# Patient Record
Sex: Female | Born: 1935 | Race: White | Hispanic: No | Smoking: Former smoker
Health system: Southern US, Community
[De-identification: ages and names within clinical notes are randomized; demographics above are authoritative.]

## PROBLEM LIST (undated history)

## (undated) DIAGNOSIS — Z8619 Personal history of other infectious and parasitic diseases: Secondary | ICD-10-CM

## (undated) DIAGNOSIS — K922 Gastrointestinal hemorrhage, unspecified: Secondary | ICD-10-CM

## (undated) DIAGNOSIS — M1990 Unspecified osteoarthritis, unspecified site: Secondary | ICD-10-CM

## (undated) DIAGNOSIS — K219 Gastro-esophageal reflux disease without esophagitis: Secondary | ICD-10-CM

## (undated) DIAGNOSIS — E785 Hyperlipidemia, unspecified: Secondary | ICD-10-CM

## (undated) DIAGNOSIS — J449 Chronic obstructive pulmonary disease, unspecified: Secondary | ICD-10-CM

## (undated) DIAGNOSIS — I5031 Acute diastolic (congestive) heart failure: Secondary | ICD-10-CM

## (undated) DIAGNOSIS — N189 Chronic kidney disease, unspecified: Secondary | ICD-10-CM

## (undated) DIAGNOSIS — H547 Unspecified visual loss: Secondary | ICD-10-CM

## (undated) DIAGNOSIS — Z9289 Personal history of other medical treatment: Secondary | ICD-10-CM

## (undated) DIAGNOSIS — I10 Essential (primary) hypertension: Secondary | ICD-10-CM

## (undated) DIAGNOSIS — K5730 Diverticulosis of large intestine without perforation or abscess without bleeding: Secondary | ICD-10-CM

## (undated) DIAGNOSIS — I447 Left bundle-branch block, unspecified: Secondary | ICD-10-CM

## (undated) HISTORY — DX: Unspecified osteoarthritis, unspecified site: M19.90

## (undated) HISTORY — DX: Chronic obstructive pulmonary disease, unspecified: J44.9

## (undated) HISTORY — DX: Acute diastolic (congestive) heart failure: I50.31

## (undated) HISTORY — DX: Left bundle-branch block, unspecified: I44.7

## (undated) HISTORY — DX: Chronic kidney disease, unspecified: N18.9

## (undated) HISTORY — DX: Gastro-esophageal reflux disease without esophagitis: K21.9

## (undated) HISTORY — DX: Diverticulosis of large intestine without perforation or abscess without bleeding: K57.30

## (undated) HISTORY — DX: Hyperlipidemia, unspecified: E78.5

## (undated) HISTORY — DX: Essential (primary) hypertension: I10

## (undated) HISTORY — DX: Personal history of other medical treatment: Z92.89

## (undated) HISTORY — DX: Unspecified visual loss: H54.7

## (undated) HISTORY — DX: Personal history of other infectious and parasitic diseases: Z86.19

---

## 1968-10-15 HISTORY — PX: TOTAL ABDOMINAL HYSTERECTOMY: SHX209

## 1978-10-15 HISTORY — PX: CHOLECYSTECTOMY: SHX55

## 1997-10-15 HISTORY — PX: CARDIAC CATHETERIZATION: SHX172

## 2003-10-16 HISTORY — PX: CERVICAL DISC SURGERY: SHX588

## 2004-09-11 ENCOUNTER — Ambulatory Visit: Payer: Self-pay | Admitting: Internal Medicine

## 2004-11-13 ENCOUNTER — Ambulatory Visit: Payer: Self-pay | Admitting: Physician Assistant

## 2004-11-15 ENCOUNTER — Ambulatory Visit: Payer: Self-pay | Admitting: Physician Assistant

## 2005-02-28 ENCOUNTER — Ambulatory Visit: Payer: Self-pay | Admitting: Internal Medicine

## 2005-10-14 ENCOUNTER — Emergency Department: Payer: Self-pay | Admitting: Emergency Medicine

## 2006-07-04 ENCOUNTER — Ambulatory Visit: Payer: Self-pay | Admitting: Family Medicine

## 2006-07-24 ENCOUNTER — Ambulatory Visit: Payer: Self-pay | Admitting: Internal Medicine

## 2006-10-23 ENCOUNTER — Ambulatory Visit: Payer: Self-pay | Admitting: Family Medicine

## 2006-10-24 ENCOUNTER — Encounter: Payer: Self-pay | Admitting: Family Medicine

## 2006-10-25 ENCOUNTER — Ambulatory Visit: Payer: Self-pay | Admitting: Family Medicine

## 2006-10-25 LAB — CONVERTED CEMR LAB: PTH: 29.6 pg/mL (ref 14.0–72.0)

## 2006-11-11 ENCOUNTER — Ambulatory Visit: Payer: Self-pay | Admitting: Family Medicine

## 2007-03-17 ENCOUNTER — Ambulatory Visit: Payer: Self-pay | Admitting: Family Medicine

## 2007-03-17 DIAGNOSIS — I10 Essential (primary) hypertension: Secondary | ICD-10-CM

## 2007-03-17 DIAGNOSIS — R011 Cardiac murmur, unspecified: Secondary | ICD-10-CM

## 2007-03-17 DIAGNOSIS — N184 Chronic kidney disease, stage 4 (severe): Secondary | ICD-10-CM

## 2007-03-25 ENCOUNTER — Encounter: Payer: Self-pay | Admitting: Internal Medicine

## 2007-03-25 ENCOUNTER — Ambulatory Visit: Payer: Self-pay

## 2007-04-07 ENCOUNTER — Ambulatory Visit: Payer: Self-pay | Admitting: Internal Medicine

## 2007-04-07 ENCOUNTER — Ambulatory Visit: Payer: Self-pay | Admitting: Family Medicine

## 2007-04-07 DIAGNOSIS — N209 Urinary calculus, unspecified: Secondary | ICD-10-CM

## 2007-04-07 DIAGNOSIS — E7800 Pure hypercholesterolemia, unspecified: Secondary | ICD-10-CM | POA: Insufficient documentation

## 2007-04-07 LAB — CONVERTED CEMR LAB
Glucose, Urine, Semiquant: NEGATIVE
Ketones, urine, test strip: NEGATIVE
Nitrite: NEGATIVE
Protein, U semiquant: NEGATIVE
Specific Gravity, Urine: <1.005
pH: 5

## 2007-04-10 LAB — CONVERTED CEMR LAB
ALT: 15 units/L (ref 0–40)
Calcium: 9.6 mg/dL (ref 8.4–10.5)
Chloride: 110 meq/L (ref 96–112)
Cholesterol: 137 mg/dL (ref 0–200)
Creatinine, Ser: 1.4 mg/dL — ABNORMAL HIGH (ref 0.4–1.2)
GFR calc non Af Amer: 38 mL/min
LDL Cholesterol: 58 mg/dL (ref 0–99)
VLDL: 25 mg/dL (ref 0–40)

## 2007-05-05 ENCOUNTER — Encounter: Payer: Self-pay | Admitting: Family Medicine

## 2007-05-05 DIAGNOSIS — M159 Polyosteoarthritis, unspecified: Secondary | ICD-10-CM

## 2007-05-05 DIAGNOSIS — K5730 Diverticulosis of large intestine without perforation or abscess without bleeding: Secondary | ICD-10-CM | POA: Insufficient documentation

## 2007-05-05 DIAGNOSIS — I872 Venous insufficiency (chronic) (peripheral): Secondary | ICD-10-CM

## 2007-05-05 DIAGNOSIS — K219 Gastro-esophageal reflux disease without esophagitis: Secondary | ICD-10-CM | POA: Insufficient documentation

## 2007-05-05 DIAGNOSIS — J449 Chronic obstructive pulmonary disease, unspecified: Secondary | ICD-10-CM

## 2007-05-05 DIAGNOSIS — J309 Allergic rhinitis, unspecified: Secondary | ICD-10-CM | POA: Insufficient documentation

## 2007-05-05 DIAGNOSIS — N39498 Other specified urinary incontinence: Secondary | ICD-10-CM

## 2007-05-05 DIAGNOSIS — I739 Peripheral vascular disease, unspecified: Secondary | ICD-10-CM | POA: Insufficient documentation

## 2007-05-05 DIAGNOSIS — M5030 Other cervical disc degeneration, unspecified cervical region: Secondary | ICD-10-CM

## 2007-05-22 ENCOUNTER — Ambulatory Visit: Payer: Self-pay | Admitting: Family Medicine

## 2007-05-22 LAB — CONVERTED CEMR LAB
Cholesterol, target level: 200 mg/dL
HDL goal, serum: 40 mg/dL
LDL Goal: 100 mg/dL

## 2007-10-06 ENCOUNTER — Encounter (INDEPENDENT_AMBULATORY_CARE_PROVIDER_SITE_OTHER): Payer: Self-pay | Admitting: Internal Medicine

## 2007-10-06 ENCOUNTER — Ambulatory Visit: Payer: Self-pay | Admitting: Family Medicine

## 2007-10-06 LAB — CONVERTED CEMR LAB
Bacteria, UA: 0
Bilirubin Urine: NEGATIVE
Ketones, urine, test strip: NEGATIVE
Nitrite: NEGATIVE
Protein, U semiquant: NEGATIVE
Urobilinogen, UA: NEGATIVE

## 2007-10-13 ENCOUNTER — Ambulatory Visit: Payer: Self-pay | Admitting: Gynecology

## 2007-10-13 ENCOUNTER — Encounter (INDEPENDENT_AMBULATORY_CARE_PROVIDER_SITE_OTHER): Payer: Self-pay | Admitting: Internal Medicine

## 2007-10-20 ENCOUNTER — Ambulatory Visit: Payer: Self-pay | Admitting: Family Medicine

## 2007-10-20 LAB — CONVERTED CEMR LAB
Bilirubin Urine: NEGATIVE
Glucose, Urine, Semiquant: NEGATIVE
Ketones, urine, test strip: NEGATIVE
Nitrite: NEGATIVE
Specific Gravity, Urine: 1.02
Urine crystals, microscopic: 0 /hpf
pH: 6

## 2007-10-21 ENCOUNTER — Encounter: Payer: Self-pay | Admitting: Family Medicine

## 2007-11-03 ENCOUNTER — Ambulatory Visit: Payer: Self-pay | Admitting: Family Medicine

## 2007-11-03 ENCOUNTER — Encounter (INDEPENDENT_AMBULATORY_CARE_PROVIDER_SITE_OTHER): Payer: Self-pay | Admitting: *Deleted

## 2007-11-03 LAB — CONVERTED CEMR LAB
Ketones, urine, test strip: NEGATIVE
Nitrite: NEGATIVE
Specific Gravity, Urine: 1.015
WBC Urine, dipstick: NEGATIVE
pH: 6

## 2007-11-07 ENCOUNTER — Encounter: Payer: Self-pay | Admitting: Family Medicine

## 2007-11-25 ENCOUNTER — Ambulatory Visit: Payer: Self-pay | Admitting: Family Medicine

## 2007-11-27 ENCOUNTER — Ambulatory Visit: Payer: Self-pay | Admitting: Family Medicine

## 2007-11-27 LAB — CONVERTED CEMR LAB
BUN: 24 mg/dL — ABNORMAL HIGH (ref 6–23)
Creatinine, Ser: 1.3 mg/dL — ABNORMAL HIGH (ref 0.4–1.2)
GFR calc Af Amer: 50 mL/min
Potassium: 3.9 meq/L (ref 3.5–5.1)

## 2007-12-11 ENCOUNTER — Telehealth: Payer: Self-pay | Admitting: Family Medicine

## 2008-01-07 ENCOUNTER — Ambulatory Visit: Payer: Self-pay | Admitting: Family Medicine

## 2008-01-09 LAB — CONVERTED CEMR LAB
BUN: 25 mg/dL — ABNORMAL HIGH (ref 6–23)
CO2: 32 meq/L (ref 19–32)
Chloride: 106 meq/L (ref 96–112)
Cholesterol: 147 mg/dL (ref 0–200)
Creatinine, Ser: 1.3 mg/dL — ABNORMAL HIGH (ref 0.4–1.2)
Glucose, Bld: 104 mg/dL — ABNORMAL HIGH (ref 70–99)
Triglycerides: 96 mg/dL (ref 0–149)

## 2008-02-20 ENCOUNTER — Ambulatory Visit: Payer: Self-pay | Admitting: Family Medicine

## 2008-02-20 LAB — CONVERTED CEMR LAB
Albumin: 4.1 g/dL (ref 3.5–5.2)
Alkaline Phosphatase: 69 units/L (ref 39–117)
BUN: 31 mg/dL — ABNORMAL HIGH (ref 6–23)
Bilirubin, Direct: 0.1 mg/dL (ref 0.0–0.3)
GFR calc Af Amer: 50 mL/min
GFR calc non Af Amer: 41 mL/min
LDL Cholesterol: 107 mg/dL — ABNORMAL HIGH (ref 0–99)
Potassium: 4.2 meq/L (ref 3.5–5.1)
Sodium: 143 meq/L (ref 135–145)
VLDL: 26 mg/dL (ref 0–40)

## 2008-02-27 ENCOUNTER — Ambulatory Visit: Payer: Self-pay | Admitting: Family Medicine

## 2008-02-27 DIAGNOSIS — R300 Dysuria: Secondary | ICD-10-CM

## 2008-02-27 LAB — CONVERTED CEMR LAB
Ketones, urine, test strip: NEGATIVE
Urobilinogen, UA: 0.2

## 2008-03-31 ENCOUNTER — Ambulatory Visit: Payer: Self-pay | Admitting: Family Medicine

## 2008-04-01 LAB — CONVERTED CEMR LAB
ALT: 15 units/L (ref 0–35)
AST: 20 units/L (ref 0–37)
Albumin: 4 g/dL (ref 3.5–5.2)
Alkaline Phosphatase: 67 units/L (ref 39–117)
BUN: 35 mg/dL — ABNORMAL HIGH (ref 6–23)
Basophils Absolute: 0 10*3/uL (ref 0.0–0.1)
Basophils Relative: 0 % (ref 0.0–1.0)
Bilirubin, Direct: 0.1 mg/dL (ref 0.0–0.3)
CO2: 31 meq/L (ref 19–32)
Chloride: 106 meq/L (ref 96–112)
Eosinophils Relative: 1.4 % (ref 0.0–5.0)
Glucose, Bld: 111 mg/dL — ABNORMAL HIGH (ref 70–99)
Hemoglobin: 13.7 g/dL (ref 12.0–15.0)
Lymphocytes Relative: 20 % (ref 12.0–46.0)
MCHC: 35.1 g/dL (ref 30.0–36.0)
Monocytes Relative: 9 % (ref 3.0–12.0)
Neutro Abs: 5 10*3/uL (ref 1.4–7.7)
Potassium: 4.7 meq/L (ref 3.5–5.1)
RBC: 4.25 M/uL (ref 3.87–5.11)
Total Protein: 7.1 g/dL (ref 6.0–8.3)
WBC: 7.3 10*3/uL (ref 4.5–10.5)

## 2008-04-05 ENCOUNTER — Telehealth (INDEPENDENT_AMBULATORY_CARE_PROVIDER_SITE_OTHER): Payer: Self-pay | Admitting: *Deleted

## 2008-05-10 ENCOUNTER — Ambulatory Visit: Payer: Self-pay | Admitting: Family Medicine

## 2008-05-10 DIAGNOSIS — G609 Hereditary and idiopathic neuropathy, unspecified: Secondary | ICD-10-CM | POA: Insufficient documentation

## 2008-05-20 ENCOUNTER — Telehealth: Payer: Self-pay | Admitting: Family Medicine

## 2008-06-04 ENCOUNTER — Encounter: Payer: Self-pay | Admitting: Family Medicine

## 2008-06-10 ENCOUNTER — Ambulatory Visit: Payer: Self-pay | Admitting: Family Medicine

## 2008-06-11 ENCOUNTER — Telehealth: Payer: Self-pay | Admitting: Family Medicine

## 2008-07-01 ENCOUNTER — Ambulatory Visit: Payer: Self-pay | Admitting: Family Medicine

## 2008-10-19 ENCOUNTER — Telehealth: Payer: Self-pay | Admitting: Family Medicine

## 2008-11-25 ENCOUNTER — Telehealth: Payer: Self-pay | Admitting: Family Medicine

## 2008-12-06 ENCOUNTER — Ambulatory Visit: Payer: Self-pay | Admitting: Family Medicine

## 2008-12-06 LAB — CONVERTED CEMR LAB
Bilirubin Urine: NEGATIVE
Glucose, Urine, Semiquant: NEGATIVE
Ketones, urine, test strip: NEGATIVE
Protein, U semiquant: NEGATIVE
Urobilinogen, UA: 0.2

## 2009-02-02 ENCOUNTER — Ambulatory Visit: Payer: Self-pay | Admitting: Family Medicine

## 2009-07-11 ENCOUNTER — Ambulatory Visit: Payer: Self-pay | Admitting: Family Medicine

## 2009-07-11 LAB — CONVERTED CEMR LAB
Albumin: 3.9 g/dL (ref 3.5–5.2)
BUN: 40 mg/dL — ABNORMAL HIGH (ref 6–23)
Basophils Absolute: 0.1 10*3/uL (ref 0.0–0.1)
CO2: 30 meq/L (ref 19–32)
Cholesterol: 177 mg/dL (ref 0–200)
Eosinophils Absolute: 0.3 10*3/uL (ref 0.0–0.7)
GFR calc non Af Amer: 37.57 mL/min (ref >60)
Glucose, Bld: 102 mg/dL — ABNORMAL HIGH (ref 70–99)
HCT: 40 % (ref 36.0–46.0)
HDL: 49.7 mg/dL (ref >39.00)
Hemoglobin: 13.5 g/dL (ref 12.0–15.0)
Lymphs Abs: 2.2 10*3/uL (ref 0.7–4.0)
MCHC: 33.8 g/dL (ref 30.0–36.0)
Monocytes Absolute: 0.6 10*3/uL (ref 0.1–1.0)
Monocytes Relative: 9.2 % (ref 3.0–12.0)
Neutro Abs: 3.4 10*3/uL (ref 1.4–7.7)
Platelets: 181 10*3/uL (ref 150.0–400.0)
Potassium: 4.2 meq/L (ref 3.5–5.1)
RDW: 12.6 % (ref 11.5–14.6)
Sodium: 146 meq/L — ABNORMAL HIGH (ref 135–145)
TSH: 1.58 microintl units/mL (ref 0.35–5.50)
Total Bilirubin: 0.7 mg/dL (ref 0.3–1.2)
VLDL: 23.6 mg/dL (ref 0.0–40.0)

## 2009-07-14 ENCOUNTER — Ambulatory Visit: Payer: Self-pay | Admitting: Family Medicine

## 2009-07-20 ENCOUNTER — Encounter: Payer: Self-pay | Admitting: Family Medicine

## 2009-08-03 ENCOUNTER — Encounter (INDEPENDENT_AMBULATORY_CARE_PROVIDER_SITE_OTHER): Payer: Self-pay | Admitting: *Deleted

## 2009-09-22 ENCOUNTER — Ambulatory Visit: Payer: Self-pay | Admitting: Family Medicine

## 2009-10-03 ENCOUNTER — Ambulatory Visit: Payer: Self-pay | Admitting: Family Medicine

## 2009-10-17 ENCOUNTER — Telehealth: Payer: Self-pay | Admitting: Family Medicine

## 2009-11-11 ENCOUNTER — Ambulatory Visit: Payer: Self-pay | Admitting: Family Medicine

## 2009-12-12 ENCOUNTER — Ambulatory Visit: Payer: Self-pay | Admitting: Family Medicine

## 2009-12-13 ENCOUNTER — Encounter: Payer: Self-pay | Admitting: Family Medicine

## 2009-12-26 ENCOUNTER — Ambulatory Visit: Payer: Self-pay | Admitting: Family Medicine

## 2009-12-27 LAB — CONVERTED CEMR LAB
CO2: 28 meq/L (ref 19–32)
Chloride: 113 meq/L — ABNORMAL HIGH (ref 96–112)
Creatinine, Ser: 1.9 mg/dL — ABNORMAL HIGH (ref 0.4–1.2)
Potassium: 5.1 meq/L (ref 3.5–5.1)
Sodium: 145 meq/L (ref 135–145)

## 2009-12-28 ENCOUNTER — Encounter: Payer: Self-pay | Admitting: Family Medicine

## 2010-01-02 ENCOUNTER — Ambulatory Visit: Payer: Self-pay | Admitting: Family Medicine

## 2010-01-04 LAB — CONVERTED CEMR LAB
BUN: 62 mg/dL — ABNORMAL HIGH (ref 6–23)
CO2: 26 meq/L (ref 19–32)
Chloride: 112 meq/L (ref 96–112)
GFR calc non Af Amer: 22.28 mL/min (ref >60)
Glucose, Bld: 112 mg/dL — ABNORMAL HIGH (ref 70–99)
Potassium: 4.5 meq/L (ref 3.5–5.1)
Sodium: 143 meq/L (ref 135–145)

## 2010-01-05 ENCOUNTER — Encounter (INDEPENDENT_AMBULATORY_CARE_PROVIDER_SITE_OTHER): Payer: Self-pay | Admitting: *Deleted

## 2010-01-10 ENCOUNTER — Ambulatory Visit: Payer: Self-pay | Admitting: Family Medicine

## 2010-01-10 LAB — CONVERTED CEMR LAB
Ketones, urine, test strip: NEGATIVE
Nitrite: POSITIVE
Protein, U semiquant: 30
Specific Gravity, Urine: 1.02
TSH: 2.64 microintl units/mL (ref 0.35–5.50)

## 2010-01-11 ENCOUNTER — Encounter: Payer: Self-pay | Admitting: Family Medicine

## 2010-01-11 LAB — CONVERTED CEMR LAB
BUN: 59 mg/dL — ABNORMAL HIGH (ref 6–23)
CO2: 27 meq/L (ref 19–32)
Chloride: 111 meq/L (ref 96–112)
Potassium: 4.7 meq/L (ref 3.5–5.1)

## 2010-01-17 ENCOUNTER — Telehealth: Payer: Self-pay | Admitting: Family Medicine

## 2010-01-23 ENCOUNTER — Encounter: Payer: Self-pay | Admitting: Cardiology

## 2010-01-25 ENCOUNTER — Ambulatory Visit: Payer: Self-pay | Admitting: Family Medicine

## 2010-01-25 DIAGNOSIS — N3941 Urge incontinence: Secondary | ICD-10-CM

## 2010-01-25 LAB — CONVERTED CEMR LAB
Blood in Urine, dipstick: NEGATIVE
Glucose, Urine, Semiquant: NEGATIVE
Ketones, urine, test strip: NEGATIVE
Nitrite: NEGATIVE
Protein, U semiquant: NEGATIVE
Specific Gravity, Urine: 1.01
pH: 5

## 2010-01-26 ENCOUNTER — Ambulatory Visit: Payer: Self-pay | Admitting: Cardiology

## 2010-01-27 LAB — CONVERTED CEMR LAB
BUN: 59 mg/dL — ABNORMAL HIGH (ref 6–23)
CO2: 22 meq/L (ref 19–32)
Chloride: 106 meq/L (ref 96–112)
Glucose, Bld: 91 mg/dL (ref 70–99)
Hemoglobin: 12.5 g/dL (ref 12.0–15.0)
Potassium: 5.2 meq/L (ref 3.5–5.3)
RBC: 4.18 M/uL (ref 3.87–5.11)

## 2010-02-07 ENCOUNTER — Ambulatory Visit: Payer: Self-pay

## 2010-02-07 ENCOUNTER — Encounter: Payer: Self-pay | Admitting: Cardiology

## 2010-02-09 ENCOUNTER — Telehealth: Payer: Self-pay | Admitting: Family Medicine

## 2010-02-09 ENCOUNTER — Ambulatory Visit: Payer: Self-pay | Admitting: Cardiology

## 2010-02-23 LAB — CONVERTED CEMR LAB
BUN: 69 mg/dL — ABNORMAL HIGH (ref 6–23)
Calcium: 8.4 mg/dL (ref 8.4–10.5)
Creatinine, Ser: 2.31 mg/dL — ABNORMAL HIGH (ref 0.40–1.20)
Glucose, Bld: 125 mg/dL — ABNORMAL HIGH (ref 70–99)

## 2010-02-27 ENCOUNTER — Telehealth: Payer: Self-pay | Admitting: Family Medicine

## 2010-02-28 ENCOUNTER — Telehealth: Payer: Self-pay | Admitting: Family Medicine

## 2010-03-02 ENCOUNTER — Ambulatory Visit: Payer: Self-pay | Admitting: Cardiology

## 2010-03-02 ENCOUNTER — Telehealth: Payer: Self-pay | Admitting: Cardiology

## 2010-03-02 DIAGNOSIS — R0989 Other specified symptoms and signs involving the circulatory and respiratory systems: Secondary | ICD-10-CM | POA: Insufficient documentation

## 2010-03-02 DIAGNOSIS — I5032 Chronic diastolic (congestive) heart failure: Secondary | ICD-10-CM | POA: Insufficient documentation

## 2010-03-02 LAB — CONVERTED CEMR LAB
Calcium: 9.2 mg/dL (ref 8.4–10.5)
Glucose, Bld: 93 mg/dL (ref 70–99)
Pro B Natriuretic peptide (BNP): 202.9 pg/mL — ABNORMAL HIGH (ref 0.0–100.0)
Sodium: 140 meq/L (ref 135–145)

## 2010-03-14 ENCOUNTER — Ambulatory Visit: Payer: Self-pay

## 2010-03-14 ENCOUNTER — Encounter: Payer: Self-pay | Admitting: Cardiology

## 2010-03-20 ENCOUNTER — Ambulatory Visit: Payer: Self-pay | Admitting: Family Medicine

## 2010-03-22 ENCOUNTER — Encounter: Payer: Self-pay | Admitting: Cardiovascular Disease

## 2010-04-03 ENCOUNTER — Ambulatory Visit: Payer: Self-pay | Admitting: Cardiovascular Disease

## 2010-04-04 ENCOUNTER — Telehealth: Payer: Self-pay | Admitting: Family Medicine

## 2010-04-24 ENCOUNTER — Ambulatory Visit: Payer: Self-pay | Admitting: Family Medicine

## 2010-04-28 ENCOUNTER — Telehealth: Payer: Self-pay | Admitting: Cardiovascular Disease

## 2010-04-28 ENCOUNTER — Ambulatory Visit: Payer: Self-pay | Admitting: Family Medicine

## 2010-04-28 DIAGNOSIS — E875 Hyperkalemia: Secondary | ICD-10-CM | POA: Insufficient documentation

## 2010-04-30 LAB — CONVERTED CEMR LAB
Basophils Relative: 1.7 % (ref 0.0–3.0)
Chloride: 115 meq/L — ABNORMAL HIGH (ref 96–112)
Creatinine, Ser: 2 mg/dL — ABNORMAL HIGH (ref 0.4–1.2)
Eosinophils Relative: 4.9 % (ref 0.0–5.0)
HDL: 48.9 mg/dL (ref >39.00)
LDL Cholesterol: 80 mg/dL (ref 0–99)
MCV: 93.9 fL (ref 78.0–100.0)
Monocytes Absolute: 0.8 10*3/uL (ref 0.1–1.0)
Neutrophils Relative %: 51.6 % (ref 43.0–77.0)
Potassium: 5.3 meq/L — ABNORMAL HIGH (ref 3.5–5.1)
RBC: 3.41 M/uL — ABNORMAL LOW (ref 3.87–5.11)
Sodium: 145 meq/L (ref 135–145)
Total CHOL/HDL Ratio: 3
Triglycerides: 119 mg/dL (ref 0.0–149.0)
WBC: 6.8 10*3/uL (ref 4.5–10.5)

## 2010-05-03 ENCOUNTER — Ambulatory Visit: Payer: Self-pay | Admitting: Family Medicine

## 2010-05-04 ENCOUNTER — Ambulatory Visit: Payer: Self-pay | Admitting: Family Medicine

## 2010-05-04 LAB — CONVERTED CEMR LAB
OCCULT 2: NEGATIVE
OCCULT 3: NEGATIVE

## 2010-05-08 LAB — CONVERTED CEMR LAB
Basophils Absolute: 0.1 10*3/uL (ref 0.0–0.1)
Ferritin: 53.2 ng/mL (ref 10.0–291.0)
GFR calc non Af Amer: 24.15 mL/min (ref >60)
Hemoglobin: 11.1 g/dL — ABNORMAL LOW (ref 12.0–15.0)
Lymphocytes Relative: 29.3 % (ref 12.0–46.0)
Monocytes Relative: 10.8 % (ref 3.0–12.0)
Neutrophils Relative %: 53.7 % (ref 43.0–77.0)
Platelets: 230 10*3/uL (ref 150.0–400.0)
Potassium: 5.1 meq/L (ref 3.5–5.1)
RDW: 14.1 % (ref 11.5–14.6)
Sodium: 142 meq/L (ref 135–145)
Vitamin B-12: 817 pg/mL (ref 211–911)

## 2010-05-18 ENCOUNTER — Ambulatory Visit: Payer: Self-pay | Admitting: Family Medicine

## 2010-06-02 ENCOUNTER — Ambulatory Visit: Payer: Self-pay | Admitting: Family Medicine

## 2010-06-02 LAB — CONVERTED CEMR LAB
Nitrite: POSITIVE
Specific Gravity, Urine: >=1.03
Urobilinogen, UA: 0.2

## 2010-06-03 ENCOUNTER — Encounter: Payer: Self-pay | Admitting: Family Medicine

## 2010-06-13 ENCOUNTER — Ambulatory Visit: Payer: Self-pay | Admitting: Family Medicine

## 2010-08-06 ENCOUNTER — Inpatient Hospital Stay (HOSPITAL_COMMUNITY): Admission: EM | Admit: 2010-08-06 | Discharge: 2010-08-06 | Payer: Self-pay | Admitting: Emergency Medicine

## 2010-08-07 ENCOUNTER — Telehealth: Payer: Self-pay | Admitting: Family Medicine

## 2010-08-11 ENCOUNTER — Ambulatory Visit: Payer: Self-pay | Admitting: Family Medicine

## 2010-08-11 LAB — CONVERTED CEMR LAB
BUN: 51 mg/dL — ABNORMAL HIGH (ref 6–23)
Chloride: 111 meq/L (ref 96–112)
Creatinine, Ser: 2.1 mg/dL — ABNORMAL HIGH (ref 0.4–1.2)
Glucose, Bld: 87 mg/dL (ref 70–99)

## 2010-08-14 ENCOUNTER — Ambulatory Visit: Payer: Self-pay | Admitting: Family Medicine

## 2010-08-16 LAB — CONVERTED CEMR LAB
Calcium: 8.4 mg/dL (ref 8.4–10.5)
Creatinine, Ser: 1.9 mg/dL — ABNORMAL HIGH (ref 0.4–1.2)
GFR calc non Af Amer: 26.51 mL/min (ref >60)
Glucose, Bld: 117 mg/dL — ABNORMAL HIGH (ref 70–99)
Sodium: 144 meq/L (ref 135–145)

## 2010-08-17 ENCOUNTER — Ambulatory Visit: Payer: Self-pay | Admitting: Family Medicine

## 2010-08-18 LAB — CONVERTED CEMR LAB
ALT: 10 units/L (ref 0–35)
AST: 18 units/L (ref 0–37)
Albumin: 3.7 g/dL (ref 3.5–5.2)
Alkaline Phosphatase: 58 units/L (ref 39–117)
BUN: 49 mg/dL — ABNORMAL HIGH (ref 6–23)
CO2: 24 meq/L (ref 19–32)
Chloride: 113 meq/L — ABNORMAL HIGH (ref 96–112)
Direct LDL: 122.1 mg/dL
GFR calc non Af Amer: 29.16 mL/min (ref >60)
Glucose, Bld: 89 mg/dL (ref 70–99)
HDL: 41 mg/dL (ref >39.00)
Potassium: 4.8 meq/L (ref 3.5–5.1)
Sodium: 144 meq/L (ref 135–145)
Total Protein: 6.3 g/dL (ref 6.0–8.3)

## 2010-08-25 ENCOUNTER — Ambulatory Visit: Payer: Self-pay | Admitting: Family Medicine

## 2010-08-29 ENCOUNTER — Encounter: Payer: Self-pay | Admitting: Family Medicine

## 2010-08-29 ENCOUNTER — Telehealth: Payer: Self-pay | Admitting: Family Medicine

## 2010-09-18 ENCOUNTER — Telehealth: Payer: Self-pay | Admitting: Family Medicine

## 2010-09-19 ENCOUNTER — Ambulatory Visit: Payer: Self-pay | Admitting: Family Medicine

## 2010-09-26 ENCOUNTER — Encounter: Payer: Self-pay | Admitting: Family Medicine

## 2010-09-27 ENCOUNTER — Telehealth: Payer: Self-pay | Admitting: Family Medicine

## 2010-09-29 ENCOUNTER — Ambulatory Visit: Payer: Self-pay | Admitting: Nephrology

## 2010-10-03 ENCOUNTER — Ambulatory Visit: Payer: Self-pay | Admitting: Family Medicine

## 2010-10-03 ENCOUNTER — Encounter: Payer: Self-pay | Admitting: Family Medicine

## 2010-10-06 ENCOUNTER — Encounter (INDEPENDENT_AMBULATORY_CARE_PROVIDER_SITE_OTHER): Payer: Self-pay | Admitting: *Deleted

## 2010-10-11 ENCOUNTER — Ambulatory Visit: Payer: Self-pay | Admitting: Nephrology

## 2010-10-13 ENCOUNTER — Encounter: Payer: Self-pay | Admitting: Family Medicine

## 2010-11-02 ENCOUNTER — Other Ambulatory Visit: Payer: Self-pay | Admitting: Unknown Physician Specialty

## 2010-11-03 ENCOUNTER — Encounter: Payer: Self-pay | Admitting: Family Medicine

## 2010-11-13 ENCOUNTER — Encounter: Payer: Self-pay | Admitting: Family Medicine

## 2010-11-14 NOTE — Progress Notes (Signed)
Summary: congestion   Phone Note Call from Patient Call back at Home Phone [phone]   Caller: Patient Call For: Kerby Nora MD Summary of Call: Patient is calling to let you know that she is still having some congestion that she spoke with you about at her last office visit. She is taking the mucinex, zyrtec, and also using saline spray. She is asking if there is something else she could take. Uses [employer].  Initial call taken by: Melody Comas,  September 18, 2010 11:08 AM  Follow-up for Phone Call        I would recheck this 77 year old.  Heather, set up f/u. Hannah Beat MD  September 18, 2010 5:27 PM      Appended Document: congestion  Appt made for tomorrow at 3:30 p

## 2010-11-14 NOTE — Assessment & Plan Note (Signed)
Summary: F1M/AMD  Medications Added BYSTOLIC 10 MG TABS (NEBIVOLOL HCL) Take 1 tablet by mouth once a day HYDRALAZINE HCL 25 MG TABS (HYDRALAZINE HCL) Take one tablet by mouth three times a day HYDRALAZINE HCL 50 MG TABS (HYDRALAZINE HCL) Take one tablet by mouth three times a day CHLORTHALIDONE 25 MG TABS (CHLORTHALIDONE) Take 1 tablet by mouth once a day      Allergies Added:   Visit Type:  Follow-up Primary Provider:  Kerby Nora MD  CC:  no cp, lots of indigestion, and some edema in ankles and feet.  History of Present Illness: 77 yo with history of difficult to control HTN, COPD, hyperlipidemia follws up for evaluation of HTN.  Patient's BP has been running high.  She was told that her loss of vision in the right eye was due to hypertension.  I cut back on her chlorthalidone and losartan  because of an increase in creatinine.  She brings in home BP readings.  SBP has ranged from 134 - 163.  Today, BP is 164/79.   Echo was suggestive of diastolic dysfunction with normal EF (60-65%).  Patient is not short of breath walking on flat ground but does get short of breath walking up a flight of steps.    Labs (3/11): K 4.7, creatinine 2, TSH normal Labs (4/11): K 4.7, creatinine 2.3 Labs (5/11): K 5.2, creatinine 2.16  Current Medications (verified): 1)  Multivitamins  Tabs (Multiple Vitamin) .... Take 1 Tablet By Mouth Once A Day 2)  Vitamin E 1000 Unit Caps (Vitamin E) .... Take 1 Capsule By Mouth Once A Day 3)  Aspirin 81 Mg Tbec (Aspirin) .... Take 1 Tablet By Mouth Once A Day 4)  Celebrex 200 Mg Caps (Celecoxib) .... Take 1 Capsule By Mouth Once A Day 5)  Tylenol Pm Extra Strength  Tabs (Diphenhydramine-Apap (Sleep) Tabs) .... Take 1 Tab By Mouth At Bedtime 6)  Simvastatin 40 Mg  Tabs (Simvastatin) .... Take 1 Tablet By Mouth Once A Day 7)  Bystolic 5 Mg Tabs (Nebivolol Hcl) .Marland Kitchen.. 1 Tab By Mouth Daily 8)  Gabapentin 600 Mg Tabs (Gabapentin) .Marland Kitchen.. 1 Tab By Mouth At Bedtime 9)   Promethegan 25 Mg Supp (Promethazine Hcl) .Marland Kitchen.. 1 Per Rectum Q 6 Hours As Needed For Nausea 10)  Losartan Potassium 100 Mg Tabs (Losartan Potassium) .... Take 1/2 Tablet Daily 11)  Chlorthalidone 100 Mg Tabs (Chlorthalidone) .... 1/4 Tab  By Mouth Daily 12)  Hydralazine Hcl 25 Mg Tabs (Hydralazine Hcl) .... Take One Tablet By Mouth Three Times A Day 13)  Enablex 15 Mg Xr24h-Tab (Darifenacin Hydrobromide) .Marland Kitchen.. 1 Tab By Mouth Daily 14)  Hydralazine Hcl 25 Mg Tabs (Hydralazine Hcl) .... Take One Tablet By Mouth Three Times A Day  Allergies (verified): 1)  ! Penicillin V Potassium (Penicillin V Potassium) 2)  ! Levaquin (Levofloxacin)  Past History:  Past Medical History: 1. Allergic rhinitis 2. COPD 3. Diverticulosis, colon 4. GERD 5. Hyperlipidemia 6. Hypertension: Lower extremity swelling with amlodipine, intolerant of ACEIs 7. Osteoarthritis 8. CKD 9. Impaired vision right eye, was told this was due to HTN 10.  Normal cath in 1999 11.  LBBB 12.  Diastolic CHF: Echo (4/11) with EF 60-65%, mild LVH, mild aortic insufficiency, mild MR, PA systolic pressure 37-41 mmHg, mild to moderate TR.   Family History: Reviewed history from 05/05/2007 and no changes required. Father: Died 25, dementia Mother: Died 60, complete heart block Siblings: 6 brothers, lung CA, sinus CA 2 sisters, one  with Colon CA, one with dementia CV:  (+) HBP:  (+) DM:  Brother Colon CA:  Sister  Social History: Reviewed history from 01/26/2010 and no changes required. Former Smoker: > 1 ppd, quit 1986 Alcohol use-yes, light Drug use-no Lives alone Children: 3 daughters, 1 RN Occupation: Retired, Therapist, nutritional Exercise:  Golf Diet:  3 meals a day  Review of Systems       All systems reviewed and negative except as per HPI.   Vital Signs:  Patient profile:   77 year old female Height:      62.25 inches Weight:      147.50 pounds BMI:     26.86 Pulse rate:   62 / minute Pulse rhythm:    irregular BP sitting:   164 / 79  (left arm) Cuff size:   large  Vitals Entered By: Mercer Pod (Mar 02, 2010 2:31 PM)  Physical Exam  General:  Well developed, well nourished, in no acute distress. Neck:  Neck supple,  JVP 8 cm. No masses, thyromegaly or abnormal cervical nodes. Lungs:  Clear bilaterally to auscultation and percussion. Heart:  Non-displaced PMI, chest non-tender; regular rate and rhythm, S1, S2 without murmurs, rubs.  +S4. Carotid upstroke normal, right carotid bruit.  Pedals normal pulses. 1+ ankle edema.  Abdomen:  Bowel sounds positive; abdomen soft and non-tender without masses, organomegaly, or hernias noted. No hepatosplenomegaly. Extremities:  No clubbing or cyanosis. Neurologic:  Alert and oriented x 3. Psych:  Normal affect.   Impression & Recommendations:  Problem # 1:  HYPERTENSION, UNSPECIFIED (ICD-401.9) BP is still running fairly high.  I will increase her nebivolol to 10 mg daily and hydralazine to 50 mg three times a day.  I will not change chlorthalidone or losartan.  Creatinine is a little lower today.  Will need to keep an eye on creatinine and K, may need further decrease in chlorthalidone/losartan based on renal function.  BP check in 2 wks, followup in 1 month and will get labs.   Problem # 2:  CAROTID BRUIT, RIGHT (ICD-785.9) There is a right-sided carotid bruit.  Given history of visual loss in right eye, would be concerned for significant right ICA stenosis.  Will get carotid dopplers.   Problem # 3:  DIASTOLIC HEART FAILURE, CHRONIC (ICD-428.32) Patient is mildly volume overloaded with mild exertional dyspnea.  Echo is suggestive of diastolic dysfunction with mild pulmonary hypertension.  I will work on blood pressure control.  Will not aggressively diurese given mild symptoms and renal dysfunction.   Other Orders: T-Basic Metabolic Panel [phone]) T-BNP  (B Natriuretic Peptide) [phone]) Carotid Duplex (Carotid  Duplex)  Patient Instructions: 1)  Your physician recommends that you schedule a follow-up appointment in: 1 month 2)  Your physician has recommended you make the following change in your medication: Increase Bystolic to 10mg  daily and increase Hydralazine to 50mg  three times a day. 3)  Your physician has requested that you have a carotid duplex. This test is an ultrasound of the carotid arteries in your neck. It looks at blood flow through these arteries that supply the brain with blood. Allow one hour for this exam. There are no restrictions or special instructions. 4)  Your physician has requested that you regularly monitor and record your blood pressure readings at home.  Please use the same machine at the same time of day to check your readings and call in 2 weeks to report. Prescriptions: HYDRALAZINE HCL 50 MG TABS (HYDRALAZINE HCL) Take one tablet  by mouth three times a day  #270 x 3   Entered by:   Cloyde Reams RN   Authorized by:   Marca Ancona, MD   Signed by:   Cloyde Reams RN on 03/02/2010   Method used:   Electronically to        [facility]* (mail-order)       [address]       [place], [place]  [address]       Ph: [phone]       Fax: [phone]   RxID:   [id number] BYSTOLIC 10 MG TABS (NEBIVOLOL HCL) Take 1 tablet by mouth once a day  #90 x 3   Entered by:   Cloyde Reams RN   Authorized by:   Marca Ancona, MD   Signed by:   Cloyde Reams RN on 03/02/2010   Method used:   Electronically to        [facility]* (mail-order)       [address]       [place], [place]  [address]       Ph: [phone]       Fax: [phone]   RxID:   [id number]   Appended Document: F1M/AMD Called spoke with pt.  Pt has been monitoring BP's regularly.  BP's 5/21-134/59 5/22-115/50 5/23-116/61 5/24-113/49 5/25-103/53 5/26-114/52 5/28-132/48 5/29-124/53 5/30-128/51 6/1-103/45 6/3-135/57 6/4-145/48 6/11-131/58. Pt states she feels pretty  good overall.  EWJ  Appended Document: F1M/AMD These look ok  Appended Document: F1M/AMD Called spoke with pt aware BP's look OK, continue on same meds.  [facility]

## 2010-11-14 NOTE — Assessment & Plan Note (Signed)
Summary: Elizabeth Silva 2 wks cyd   Vital Signs:  Patient profile:   77 year old female Height:      62.25 inches Weight:      146.4 pounds Temp:     98.1 degrees F oral Pulse rate:   60 / minute Pulse rhythm:   regular BP sitting:   132 / 60  (left arm) Cuff size:   regular  Vitals Entered By: Benny Lennert CMA Duncan Dull) (January 25, 2010 11:01 AM)  History of Present Illness: Chief complaint 2 week follow up  Mild COPD. Wheezing in chest improved with Spiriva.  No cough. No fever.  HTN, poor control at home 181/71.Marland KitchenMarland KitchenIn past maxide ineffective, amlodipine caused severe peripheral swelling, ? intolerant of ACE I.  Here today BP well control. Has checked home meter in apst..matched up in office  She is on max metoprolol with HR limitations, max losartan. Chlorthalidone 100 mg has decreased her renal function significantly with only moderate improvement in BP. Returned  to 50 mg daily..recheck Cr. Has appt with cardiologist tommorow.   Continues with urinary incontinece. Wearing pad all the time.  Has completed course of cipro for UTI.Marland KitchenUA today clear.  Problems Prior to Update: 1)  Uti  (ICD-599.0) 2)  Essential Hypertension, Benign  (ICD-401.1) 3)  Visual Changes  (ICD-368.10) 4)  Headache  (ICD-784.0) 5)  Other Screening Mammogram  (ICD-V76.12) 6)  Special Screening For Osteoporosis  (ICD-V82.81) 7)  Ganglion Cyst, Wrist, Left  (ICD-727.41) 8)  Special Screening Malig Neoplasms Other Sites  (ICD-V76.49) 9)  Peripheral Neuropathy, Idiopathic  (ICD-356.9) 10)  Fatigue  (ICD-780.79) 11)  Dysuria, Chronic  (ICD-788.1) 12)  Vaginal Mass  (ICD-625.8) 13)  Stress Incontinence  (ICD-788.39) 14)  Venous Insufficiency  (ICD-459.81) 15)  Degeneration, Cervical Disc  (ICD-722.4) 16)  Retinal Vein Occlusion, Blind in Right Eye  (ICD-362.30) 17)  Renal Insufficiency  (ICD-588.9) 18)  Peripheral Vascular Disease  (ICD-443.9) 19)  Osteoarthritis  (ICD-715.90) 20)  Gerd  (ICD-530.81) 21)   Diverticulosis, Colon  (ICD-562.10) 22)  COPD  (ICD-496) 23)  Allergic Rhinitis  (ICD-477.9) 24)  Renal Stone  (ICD-592.9) 25)  Hypercholesterolemia, Pure  (ICD-272.0) 26)  Cardiac Murmur  (ICD-785.2) 27)  Kidney Disease, Chronic, Stage Iii  (ICD-585.3) 28)  Essential Hypertension  (ICD-401.9)  Current Medications (verified): 1)  Multivitamins  Tabs (Multiple Vitamin) .... Take 1 Tablet By Mouth Once A Day 2)  Vitamin E 1000 Unit Caps (Vitamin E) .... Take 1 Capsule By Mouth Once A Day 3)  Aspirin 81 Mg Tbec (Aspirin) .... Take 1 Tablet By Mouth Once A Day 4)  Celebrex 200 Mg Caps (Celecoxib) .... Take 1 Capsule By Mouth Once A Day 5)  Tylenol Pm Extra Strength  Tabs (Diphenhydramine-Apap (Sleep) Tabs) .... Take 1 Tab By Mouth At Bedtime 6)  Simvastatin 40 Mg  Tabs (Simvastatin) .... Take 1 Tablet By Mouth Once A Day 7)  Metoprolol Tartrate 25 Mg  Tabs (Metoprolol Tartrate) .... Take 1 Tablet By Mouth Every 12 Hours 8)  Gabapentin 600 Mg Tabs (Gabapentin) .Marland Kitchen.. 1 Tab By Mouth At Bedtime 9)  Promethegan 25 Mg Supp (Promethazine Hcl) .Marland Kitchen.. 1 Per Rectum Q 6 Hours As Needed For Nausea 10)  Losartan Potassium 100 Mg Tabs (Losartan Potassium) .Marland Kitchen.. 1 By Mouth Daily 11)  Chlorthalidone 100 Mg Tabs (Chlorthalidone) .... 1/2 Tab  By Mouth Daily 12)  Spiriva Handihaler 18 Mcg Caps (Tiotropium Bromide Monohydrate) .Marland Kitchen.. 1 Cap  Inhalation Daily 13)  Detrol La 4 Mg Xr24h-Cap (  Tolterodine Tartrate) .Marland Kitchen.. 1 Tab By Mouth Daily  Allergies: 1)  ! Penicillin V Potassium (Penicillin V Potassium) 2)  ! Levaquin (Levofloxacin)  Past History:  Past medical, surgical, family and social histories (including risk factors) reviewed, and no changes noted (except as noted below).  Past Medical History: Reviewed history from 05/05/2007 and no changes required. Allergic rhinitis COPD Diverticulosis, colon GERD Hyperlipidemia Hypertension Osteoarthritis Peripheral vascular disease Renal insufficiency  Past  Surgical History: Reviewed history from 05/05/2007 and no changes required. 2003      Hospitalized colitis, pseudomembranous - C.Diff 1970      Total hysterectomy - suspicious cells 1980      Cholecystectomy 1999      Stress test - ischemia 1999      Cath - nml. 2000      PFT's 2005      C-spine - cerv. disc  dz.  C5-C7, facet arthritis 2004      Abd. CT (-) 04/2005   ABI nml 05/2006   CXR, COPD, osteopenia 11/2006   DXA nml. T + 1.32  Family History: Reviewed history from 05/05/2007 and no changes required. Father: Died 24, dementia Mother: Died 47, complete heart block Siblings: 6 brothers, lung CA, sinus CA 2 sisters, one with Colon CA, one with dementia CV:  (+) HBP:  (+) DM:  Brother Colon CA:  Sister  Social History: Reviewed history from 05/05/2007 and no changes required. Former Smoker Alcohol use-yes, light Drug use-no Marital Status: Married, 64 years Children: 3 daughters, 1 Charity fundraiser Occupation: Retired, Therapist, nutritional Exercise:  Golf Diet:  3 meals a day  Review of Systems General:  Denies fatigue and fever. CV:  Denies chest pain or discomfort. Resp:  Denies shortness of breath. GI:  Denies abdominal pain, bloody stools, constipation, and diarrhea. GU:  Complains of incontinence, nocturia, and urinary frequency; denies dysuria and hematuria.  Physical Exam  General:  elderly female in NAD Mouth:  MMM Neck:  no carotid bruit or thyromegaly no cervical or supraclavicular lymphadenopathy  Lungs:  Normal respiratory effort, chest expands symmetrically. Lungs are clear to auscultation, no crackles or wheezes. Heart:  Normal rate and regular rhythm. S1 and S2 normal without gallop, murmur, click, rub or other extra sounds. Abdomen:  Bowel sounds positive,abdomen soft and non-tender without masses, organomegaly or hernias noted. No CVA tenderness Pulses:  R and L posterior tibial pulses are full and equal bilaterally  Extremities:  trace left pedal edema and trace  right pedal edema.     Impression & Recommendations:  Problem # 1:  UTI (ICD-599.0) Resolved.  The following medications were removed from the medication list:    Ciprofloxacin Hcl 500 Mg Tabs (Ciprofloxacin hcl) .Marland Kitchen... 1 tab by mouth two times a day x 7 days Her updated medication list for this problem includes:    Detrol La 4 Mg Xr24h-cap (Tolterodine tartrate) .Marland Kitchen... 1 tab by mouth daily  Problem # 2:  INCONTINENCE, URGE (ICD-788.31) now the at UTI resolved..treat with Detrol LA..samples given.  Discussed SE in elderly.Symptoms: Characteristics: Location: Onset: Aggravating Factors: Relieving Factors: History:  Level of Urgency: Nursing Diagnosis/Chief Complaint: Suggested Follow Up To Physician:  Patient Education: Contingency Plan: Follow Up Response: if sedation.   Problem # 3:  COPD (ICD-496) Improved control on spiriva. Continue. No further SOB, wheeze.  Her updated medication list for this problem includes:    Spiriva Handihaler 18 Mcg Caps (Tiotropium bromide monohydrate) .Marland Kitchen... 1 cap  inhalation daily  Problem # 4:  ESSENTIAL  HYPERTENSION, BENIGN (ICD-401.1) Poor control on current meds..refer to cards for difficult to control BPs.. bring meter to verify again home measurments as last 2 OV BP have been good.  LAst check Creatinine trending down on lower dose of diuretic.  Her updated medication list for this problem includes:    Metoprolol Tartrate 25 Mg Tabs (Metoprolol tartrate) .Marland Kitchen... Take 1 tablet by mouth every 12 hours    Losartan Potassium 100 Mg Tabs (Losartan potassium) .Marland Kitchen... 1 by mouth daily    Chlorthalidone 100 Mg Tabs (Chlorthalidone) .Marland Kitchen... 1/2 tab  by mouth daily  Complete Medication List: 1)  Multivitamins Tabs (Multiple vitamin) .... Take 1 tablet by mouth once a day 2)  Vitamin E 1000 Unit Caps (Vitamin e) .... Take 1 capsule by mouth once a day 3)  Aspirin 81 Mg Tbec (Aspirin) .... Take 1 tablet by mouth once a day 4)  Celebrex 200 Mg Caps  (Celecoxib) .... Take 1 capsule by mouth once a day 5)  Tylenol Pm Extra Strength Tabs (Diphenhydramine-apap (sleep) tabs) .... Take 1 tab by mouth at bedtime 6)  Simvastatin 40 Mg Tabs (Simvastatin) .... Take 1 tablet by mouth once a day 7)  Metoprolol Tartrate 25 Mg Tabs (Metoprolol tartrate) .... Take 1 tablet by mouth every 12 hours 8)  Gabapentin 600 Mg Tabs (Gabapentin) .Marland Kitchen.. 1 tab by mouth at bedtime 9)  Promethegan 25 Mg Supp (Promethazine hcl) .Marland Kitchen.. 1 per rectum q 6 hours as needed for nausea 10)  Losartan Potassium 100 Mg Tabs (Losartan potassium) .Marland Kitchen.. 1 by mouth daily 11)  Chlorthalidone 100 Mg Tabs (Chlorthalidone) .... 1/2 tab  by mouth daily 12)  Spiriva Handihaler 18 Mcg Caps (Tiotropium bromide monohydrate) .Marland Kitchen.. 1 cap  inhalation daily 13)  Detrol La 4 Mg Xr24h-cap (Tolterodine tartrate) .Marland Kitchen.. 1 tab by mouth daily  Patient Instructions: 1)  Start  Detrol LA daily. 2)  Continue SPiriva daily for breathing.  3)  Follow up urinary incontinence in 3 month 4)  Call  sooner if no improvement. Keep appt with cardiology for BP control..bring meter to OV appt.  Current Allergies (reviewed today): ! PENICILLIN V POTASSIUM (PENICILLIN V POTASSIUM) ! LEVAQUIN (LEVOFLOXACIN)  Laboratory Results   Urine Tests  Date/Time Received: January 25, 2010 11:08 AM  Date/Time Reported: January 25, 2010 11:08 AM   Routine Urinalysis   Color: yellow Appearance: Clear Glucose: negative   (Normal Range: Negative) Bilirubin: negative   (Normal Range: Negative) Ketone: negative   (Normal Range: Negative) Spec. Gravity: 1.010   (Normal Range: 1.003-1.035) Blood: negative   (Normal Range: Negative) pH: 5.0   (Normal Range: 5.0-8.0) Protein: negative   (Normal Range: Negative) Urobilinogen: 0.2   (Normal Range: 0-1) Nitrite: negative   (Normal Range: Negative) Leukocyte Esterace: negative   (Normal Range: Negative)

## 2010-11-14 NOTE — Assessment & Plan Note (Signed)
Summary: F/U [facility] ON 08/05/10/[facility]   Vital Signs:  Patient profile:   77 year old female Height:      62.25 inches Weight:      134.0 pounds BMI:     24.40 Temp:     98.2 degrees F oral Pulse rate:   64 / minute Pulse rhythm:   regular BP sitting:   110 / 64  (left arm) Cuff size:   regular  Vitals Entered By: Benny Lennert CMA Duncan Dull) (August 11, 2010 11:49 AM)  History of Present Illness: Chief complaint follow up [place]   10/23 admitted for SOB, chest pressure, mild cough. Dx with COPD, exacerbation..started on IV rochephin and zithromax.Marland KitchenMarland KitchenD/C'd on ceftin (has 3 more days) and spiriva.  [place] ing albuterol as needed...has not needed this since discharge.  CXR and EKG nml.   Found to have Hyperkalemia K 6.2....given kayexylate, IV fluids.  Not on any potassium supplement..was  on losartan and chlorthalidone...stopped in hosp. BP running well at home since off these meds.   Aso found to have UTI.Marland Kitchenasymptomatic...started on Zosyn Referred to [facility]..supposed to call for an appointment Cr 1.9 in hopsital..baseline around 2.  Problems Prior to Update: 1)  Leg Pain, Bilateral  (ICD-729.5) 2)  Hyperkalemia  (ICD-276.7) 3)  Encounter For Long-term Use of Other Medications  (ICD-V58.69) 4)  Diastolic Heart Failure, Chronic  (ICD-428.32) 5)  Carotid Bruit, Right  (ICD-785.9) 6)  Incontinence, Urge  (ICD-788.31) 7)  Visual Changes  (ICD-368.10) 8)  Other Screening Mammogram  (ICD-V76.12) 9)  Special Screening For Osteoporosis  (ICD-V82.81) 10)  Ganglion Cyst, Wrist, Left  (ICD-727.41) 11)  Special Screening Malig Neoplasms Other Sites  (ICD-V76.49) 12)  Peripheral Neuropathy, Idiopathic  (ICD-356.9) 13)  Dysuria, Chronic  (ICD-788.1) 14)  Stress Incontinence  (ICD-788.39) 15)  Venous Insufficiency  (ICD-459.81) 16)  Degeneration, Cervical Disc  (ICD-722.4) 17)  Retinal Vein Occlusion, Blind in Right Eye  (ICD-362.30) 18)  Renal Insufficiency  (ICD-588.9) 19)   Peripheral Vascular Disease  (ICD-443.9) 20)  Osteoarthritis  (ICD-715.90) 21)  Gerd  (ICD-530.81) 22)  Diverticulosis, Colon  (ICD-562.10) 23)  COPD  (ICD-496) 24)  Allergic Rhinitis  (ICD-477.9) 25)  Renal Stone  (ICD-592.9) 26)  Hypercholesterolemia, Pure  (ICD-272.0) 27)  Cardiac Murmur  (ICD-785.2) 28)  Kidney Disease, Chronic, Stage Iii  (ICD-585.3) 29)  Essential Hypertension  (ICD-401.9)  Current Medications (verified): 1)  Vitamin E 1000 Unit Caps (Vitamin E) .... Take 1 Capsule By Mouth Once A Day 2)  Aspirin 81 Mg Tbec (Aspirin) .... Take 1 Tablet By Mouth Once A Day 3)  Celebrex 200 Mg Caps (Celecoxib) .... Take 1 Capsule By Mouth Once A Day 4)  Tylenol Pm Extra Strength  Tabs (Diphenhydramine-Apap (Sleep) Tabs) .... Take 1 Tab By Mouth At Bedtime 5)  Bystolic 10 Mg Tabs (Nebivolol Hcl) .... Take 1 Tablet By Mouth Once A Day 6)  Gabapentin 600 Mg Tabs (Gabapentin) .Marland Kitchen.. 1 Tab By Mouth At Bedtime 7)  Enablex 15 Mg Xr24h-Tab (Darifenacin Hydrobromide) .Marland Kitchen.. 1 Tab By Mouth Daily 8)  Hydralazine Hcl 50 Mg Tabs (Hydralazine Hcl) .... Take One Tablet By Mouth Three Times A Day 9)  Proventil Hfa 108 (90 Base) Mcg/act Aers (Albuterol Sulfate) .... 2 Puffs Every 4 Hours 10)  Ceftin 250 Mg Tabs (Cefuroxime Axetil) .... One Tablet 2 Times Daily 11)  Mucinex 600 Mg Xr12h-Tab (Guaifenesin) .... One Tablet 2 Times Daily 12)  Norvasc 5 Mg Tabs (Amlodipine Besylate) .... One Tablet Daily 13)  Spiriva Handihaler 18 Mcg Caps (Tiotropium Bromide Monohydrate) .Marland Kitchen.. 1 Inhalation Daily  Allergies: 1)  ! Penicillin V Potassium (Penicillin V Potassium) 2)  ! Levaquin (Levofloxacin)  Past History:  Past medical, surgical, family and social histories (including risk factors) reviewed, and no changes noted (except as noted below).  Past Medical History: Reviewed history from 03/02/2010 and no changes required. 1. Allergic rhinitis 2. COPD 3. Diverticulosis, colon 4. GERD 5. Hyperlipidemia 6.  Hypertension: Lower extremity swelling with amlodipine, intolerant of ACEIs 7. Osteoarthritis 8. CKD 9. Impaired vision right eye, was told this was due to HTN 10.  Normal cath in 1999 11.  LBBB 12.  Diastolic CHF: Echo (4/11) with EF 60-65%, mild LVH, mild aortic insufficiency, mild MR, PA systolic pressure 37-41 mmHg, mild to moderate TR.   Past Surgical History: Reviewed history from 05/05/2007 and no changes required. 2003      Hospitalized colitis, pseudomembranous - C.Diff 1970      Total hysterectomy - suspicious cells 1980      Cholecystectomy 1999      Stress test - ischemia 1999      Cath - nml. 2000      PFT's 2005      C-spine - cerv. disc  dz.  C5-C7, facet arthritis 2004      Abd. CT (-) 04/2005   ABI nml 05/2006   CXR, COPD, osteopenia 11/2006   DXA nml. T + 1.32  Family History: Reviewed history from 05/05/2007 and no changes required. Father: Died 54, dementia Mother: Died 61, complete heart block Siblings: 6 brothers, lung CA, sinus CA 2 sisters, one with Colon CA, one with dementia CV:  (+) HBP:  (+) DM:  Brother Colon CA:  Sister  Social History: Reviewed history from 01/26/2010 and no changes required. Former Smoker: > 1 ppd, quit 1986 Alcohol use-yes, light Drug use-no Lives alone Children: 3 daughters, 1 RN Occupation: Retired, Therapist, nutritional Exercise:  Golf Diet:  3 meals a day  Review of Systems General:  Denies fatigue and fever. CV:  Denies chest pain or discomfort. Resp:  Complains of cough; denies shortness of breath, sputum productive, and wheezing. GI:  Denies abdominal pain. GU:  Denies dysuria.  Physical Exam  General:  elderly female in NAD Ears:  External ear exam shows no significant lesions or deformities.  Otoscopic examination reveals clear canals, tympanic membranes are intact bilaterally without bulging, retraction, inflammation or discharge. Hearing is grossly normal bilaterally. Nose:  External nasal examination shows  no deformity or inflammation. Nasal mucosa are pink and moist without lesions or exudates. Mouth:  MMM Neck:  No deformities, masses, or tenderness noted. Lungs:  occ rhonchi B lungs, no wheeze, good air movement. Heart:  Normal rate and regular rhythm. S1 and S2 normal without gallop, murmur, click, rub or other extra sounds. Abdomen:  Bowel sounds positive,abdomen soft and non-tender without masses, organomegaly or hernias noted. Pulses:  R and L posterior tibial pulses are full and equal bilaterally  Extremities:  trace left pedal edema and trace right pedal edema.   Skin:  Intact without suspicious lesions or rashes   Impression & Recommendations:  Problem # 1:  HYPERKALEMIA (ICD-276.7) Liekly due to [employer]..she has been borederline in past. Recehck today.  Orders: TLB-BMP (Basic Metabolic Panel-BMET) (80048-METABOL)  Problem # 2:  KIDNEY DISEASE, CHRONIC, STAGE III (ICD-585.3) Stable mild renal insufficieny until 12/2009 this year..mild jump..stable at 1.9-2.0 since. Likly multifactorial due to BP/age/meds...refer tonephrology for further recommendaions.  Orders: Nephrology  Referral (Nephro)  Problem # 3:  COPD (ICD-496) resolving exacerbation. Improved back on Spiriva, using albuterol as needed. Complete antibiotics. Follow up in 2 weeks to asssure resolution.  Her updated medication list for this problem includes:    Proventil Hfa 108 (90 Base) Mcg/act Aers (Albuterol sulfate) .Marland Kitchen... 2 puffs every 4 hours    Spiriva Handihaler 18 Mcg Caps (Tiotropium bromide monohydrate) .Marland Kitchen... 1 inhalation daily  Problem # 4:  ESSENTIAL HYPERTENSION (ICD-401.9) Stable off losartan and chlorthalidone.  The following medications were removed from the medication list:    Losartan Potassium 100 Mg Tabs (Losartan potassium) .Marland Kitchen... Take 1/2 tablet daily    Chlorthalidone 25 Mg Tabs (Chlorthalidone) .Marland Kitchen... Take1/2 tablet by mouth daily Her updated medication list for this problem includes:     Bystolic 10 Mg Tabs (Nebivolol hcl) .Marland Kitchen... Take 1 tablet by mouth once a day    Hydralazine Hcl 50 Mg Tabs (Hydralazine hcl) .Marland Kitchen... Take one tablet by mouth three times a day    Norvasc 5 Mg Tabs (Amlodipine besylate) ..... One tablet daily  Complete Medication List: 1)  Vitamin E 1000 Unit Caps (Vitamin e) .... Take 1 capsule by mouth once a day 2)  Aspirin 81 Mg Tbec (Aspirin) .... Take 1 tablet by mouth once a day 3)  Celebrex 200 Mg Caps (Celecoxib) .... Take 1 capsule by mouth once a day 4)  Tylenol Pm Extra Strength Tabs (Diphenhydramine-apap (sleep) tabs) .... Take 1 tab by mouth at bedtime 5)  Bystolic 10 Mg Tabs (Nebivolol hcl) .... Take 1 tablet by mouth once a day 6)  Gabapentin 600 Mg Tabs (Gabapentin) .Marland Kitchen.. 1 tab by mouth at bedtime 7)  Enablex 15 Mg Xr24h-tab (Darifenacin hydrobromide) .Marland Kitchen.. 1 tab by mouth daily 8)  Hydralazine Hcl 50 Mg Tabs (Hydralazine hcl) .... Take one tablet by mouth three times a day 9)  Proventil Hfa 108 (90 Base) Mcg/act Aers (Albuterol sulfate) .... 2 puffs every 4 hours 10)  Ceftin 250 Mg Tabs (Cefuroxime axetil) .... One tablet 2 times daily 11)  Mucinex 600 Mg Xr12h-tab (Guaifenesin) .... One tablet 2 times daily 12)  Norvasc 5 Mg Tabs (Amlodipine besylate) .... One tablet daily 13)  Spiriva Handihaler 18 Mcg Caps (Tiotropium bromide monohydrate) .Marland Kitchen.. 1 inhalation daily  Patient Instructions: 1)  Referral Appointment Information 2)  Day/Date: 3)  Time: 4)  Place/MD: 5)  Address: 6)  Phone/Fax: 7)  Patient given appointment information. Information/Orders faxed/mailed.  8)  Follow BP at home because off multiple BP meds.  9)   Complete antibiotic course. 10)  Keep follow up appt in 2 weeks.    Orders Added: 1)  Nephrology Referral [Nephro] 2)  TLB-BMP (Basic Metabolic Panel-BMET) [80048-METABOL] 3)  Est. Patient Level IV [16109]    Current Allergies (reviewed today): ! PENICILLIN V POTASSIUM (PENICILLIN V POTASSIUM) ! LEVAQUIN  (LEVOFLOXACIN)  Last Flu Vaccine:  given (07/14/2009 1:32:10 PM) Flu Vaccine Result Date:  08/11/2010 Flu Vaccine Result:  given Flu Vaccine Next Due:  1 yr

## 2010-11-14 NOTE — Progress Notes (Signed)
Summary: pt needs refills  Phone Note Call from Patient Call back at Home Phone [phone]   Caller: Patient Call For: Kerby Nora MD Summary of Call: [facility] told [facility] dont [facility] them. She is needing refills for simvastatin, celebrex and lasix to be called or faxed to them.  Phone is [phone], fax is [phone]. Initial call taken by: Lowella Petties CMA,  October 17, 2009 5:00 PM  Follow-up for Phone Call        rx printed and will fax to mail order pharmacy Follow-up by: Benny Lennert CMA Duncan Dull),  October 17, 2009 5:11 PM    Prescriptions: SIMVASTATIN 40 MG  TABS (SIMVASTATIN) Take 1 tablet by mouth once a day  #90 x 3   Entered by:   Benny Lennert CMA (AAMA)   Authorized by:   Kerby Nora MD   Signed by:   Benny Lennert CMA (AAMA) on 10/17/2009   Method used:   Printed then faxed to ...       [facility]* (retail)       [address]       [place], [place]  [address]       Ph: [phone]       Fax: [phone]   RxID:   [id number] FUROSEMIDE 20 MG TABS (FUROSEMIDE) Take 1 tablet by mouth once a day as needed swelling  #90 x 3   Entered by:   Benny Lennert CMA (AAMA)   Authorized by:   Kerby Nora MD   Signed by:   Benny Lennert CMA (AAMA) on 10/17/2009   Method used:   Printed then faxed to ...       [facility]* (retail)       [address]       [place], [place]  [address]       Ph: [phone]       Fax: [phone]   RxID:   [id number] CELEBREX 200 MG CAPS (CELECOXIB) Take 1 capsule by mouth once a day  #90 x 3   Entered by:   Benny Lennert CMA (AAMA)   Authorized by:   Kerby Nora MD   Signed by:   Benny Lennert CMA (AAMA) on 10/17/2009   Method used:   Printed then faxed to ...       [facility]* (retail)       [address]       [place], [place]  [address]       Ph: [phone]       Fax:  [phone]   RxID:   [id number]

## 2010-11-14 NOTE — Progress Notes (Signed)
Summary: regarding norvasc and zocor  Phone Note From Pharmacy   Caller: [facility] Summary of Call: Pharmacy has sent a form regarding interaction between norvasc and zocor.  Form is on your desk. Initial call taken by: Lowella Petties CMA, AAMA,  August 29, 2010 10:27 AM

## 2010-11-14 NOTE — Assessment & Plan Note (Signed)
Summary: ROA 1 MTHS CYD   Vital Signs:  Patient profile:   77 year old female Height:      62.25 inches Weight:      149.6 pounds Pulse rate:   64 / minute Pulse rhythm:   regular BP sitting:   140 / 64  (left arm) Cuff size:   regular  Vitals Entered By: Benny Lennert CMA Duncan Dull) (December 12, 2009 12:01 PM)  History of Present Illness: Chief complaint 1 month followup  BPs not well controlled at home..172/67, 142/57 Has continued to have peripheral swelling in ankles... Takes furosemide daily..not sure helps minimal.   Problems Prior to Update: 1)  Visual Changes  (ICD-368.10) 2)  Headache  (ICD-784.0) 3)  Other Screening Mammogram  (ICD-V76.12) 4)  Special Screening For Osteoporosis  (ICD-V82.81) 5)  Ganglion Cyst, Wrist, Left  (ICD-727.41) 6)  Special Screening Malig Neoplasms Other Sites  (ICD-V76.49) 7)  Peripheral Neuropathy, Idiopathic  (ICD-356.9) 8)  Fatigue  (ICD-780.79) 9)  Dysuria, Chronic  (ICD-788.1) 10)  Vaginal Mass  (ICD-625.8) 11)  Stress Incontinence  (ICD-788.39) 12)  Venous Insufficiency  (ICD-459.81) 13)  Degeneration, Cervical Disc  (ICD-722.4) 14)  Retinal Vein Occlusion, Blind in Right Eye  (ICD-362.30) 15)  Renal Insufficiency  (ICD-588.9) 16)  Peripheral Vascular Disease  (ICD-443.9) 17)  Osteoarthritis  (ICD-715.90) 18)  Gerd  (ICD-530.81) 19)  Diverticulosis, Colon  (ICD-562.10) 20)  COPD  (ICD-496) 21)  Allergic Rhinitis  (ICD-477.9) 22)  Renal Stone  (ICD-592.9) 23)  Hypercholesterolemia, Pure  (ICD-272.0) 24)  Cardiac Murmur  (ICD-785.2) 25)  Kidney Disease, Chronic, Stage Iii  (ICD-585.3) 26)  Essential Hypertension  (ICD-401.9)  Current Medications (verified): 1)  Multivitamins  Tabs (Multiple Vitamin) .... Take 1 Tablet By Mouth Once A Day 2)  Vitamin E 1000 Unit Caps (Vitamin E) .... Take 1 Capsule By Mouth Once A Day 3)  Aspirin 81 Mg Tbec (Aspirin) .... Take 1 Tablet By Mouth Once A Day 4)  Celebrex 200 Mg Caps (Celecoxib)  .... Take 1 Capsule By Mouth Once A Day 5)  Tylenol Pm Extra Strength  Tabs (Diphenhydramine-Apap (Sleep) Tabs) .... Take 1 Tab By Mouth At Bedtime 6)  Furosemide 20 Mg Tabs (Furosemide) .... Take 1 Tablet By Mouth Once A Day As Needed Swelling 7)  Simvastatin 40 Mg  Tabs (Simvastatin) .... Take 1 Tablet By Mouth Once A Day 8)  Metoprolol Tartrate 25 Mg  Tabs (Metoprolol Tartrate) .... Take 1 Tablet By Mouth Every 12 Hours 9)  Gabapentin 600 Mg Tabs (Gabapentin) .Marland Kitchen.. 1 Tab By Mouth At Bedtime 10)  Promethegan 25 Mg Supp (Promethazine Hcl) .Marland Kitchen.. 1 Per Rectum Q 6 Hours As Needed For Nausea 11)  Losartan Potassium 100 Mg Tabs (Losartan Potassium) .Marland Kitchen.. 1 By Mouth Daily 12)  Chlorthalidone 50 Mg Tabs (Chlorthalidone) .Marland Kitchen.. 1 Tab By Mouth Daily  Allergies: 1)  ! Penicillin V Potassium (Penicillin V Potassium) 2)  ! Levaquin (Levofloxacin)  Past History:  Past medical, surgical, family and social histories (including risk factors) reviewed, and no changes noted (except as noted below).  Past Medical History: Reviewed history from 05/05/2007 and no changes required. Allergic rhinitis COPD Diverticulosis, colon GERD Hyperlipidemia Hypertension Osteoarthritis Peripheral vascular disease Renal insufficiency  Past Surgical History: Reviewed history from 05/05/2007 and no changes required. 2003      Hospitalized colitis, pseudomembranous - C.Diff 1970      Total hysterectomy - suspicious cells 1980      Cholecystectomy 1999  Stress test - ischemia 1999      Cath - nml. 2000      PFT's 2005      C-spine - cerv. disc  dz.  C5-C7, facet arthritis 2004      Abd. CT (-) 04/2005   ABI nml 05/2006   CXR, COPD, osteopenia 11/2006   DXA nml. T + 1.32  Family History: Reviewed history from 05/05/2007 and no changes required. Father: Died 65, dementia Mother: Died 80, complete heart block Siblings: 6 brothers, lung CA, sinus CA 2 sisters, one with Colon CA, one with dementia CV:  (+) HBP:   (+) DM:  Brother Colon CA:  Sister  Social History: Reviewed history from 05/05/2007 and no changes required. Former Smoker Alcohol use-yes, light Drug use-no Marital Status: Married, 64 years Children: 3 daughters, 1 Charity fundraiser Occupation: Retired, Therapist, nutritional Exercise:  Golf Diet:  3 meals a day  Review of Systems General:  Denies fatigue and fever. CV:  Denies chest pain or discomfort. Resp:  Denies shortness of breath. Neuro:  Denies brief paralysis, difficulty with concentration, disturbances in coordination, falling down, numbness, seizures, tingling, and weakness.  Physical Exam  General:  elderly female in NAD Mouth:  MMM Neck:  no carotid bruit or thyromegaly no cervical or supraclavicular lymphadenopathy  Lungs:  Normal respiratory effort, chest expands symmetrically. Lungs are clear to auscultation, no crackles or wheezes. Heart:  Normal rate and regular rhythm. S1 and S2 normal without gallop, murmur, click, rub or other extra sounds. Pulses:  R and L posterior tibial pulses are full and equal bilaterally  Extremities:  1+ left pedal edema and 1+ right pedal edema.     Impression & Recommendations:  Problem # 1:  ESSENTIAL HYPERTENSION (ICD-401.9) Poor control...stop amlodipine given worsening of peripheral edema despite lower dose. Hold furosemid...instead use chlorthalidone for BP control and swelling. Continue other meidciaotns. Will check potassium in next 2 weeks..may need supplementation. Call with home BP measurements or new symtpoms.  The following medications were removed from the medication list:    Amlodipine Besylate 2.5 Mg Tabs (Amlodipine besylate) .Marland Kitchen... 1 tab by mouth daily Her updated medication list for this problem includes:    Furosemide 20 Mg Tabs (Furosemide) .Marland Kitchen... Take 1 tablet by mouth once a day as needed swelling    Metoprolol Tartrate 25 Mg Tabs (Metoprolol tartrate) .Marland Kitchen... Take 1 tablet by mouth every 12 hours    Losartan Potassium 100 Mg  Tabs (Losartan potassium) .Marland Kitchen... 1 by mouth daily    Chlorthalidone 50 Mg Tabs (Chlorthalidone) .Marland Kitchen... 1 tab by mouth daily  BP today: 140/64 Prior BP: 122/48 (11/11/2009)  Prior 10 Yr Risk Heart Disease: 6 % (07/14/2009)  Labs Reviewed: K+: 4.2 (07/11/2009) Creat: : 1.4 (07/11/2009)   Chol: 177 (07/11/2009)   HDL: 49.70 (07/11/2009)   LDL: 104 (07/11/2009)   TG: 118.0 (07/11/2009)  Complete Medication List: 1)  Multivitamins Tabs (Multiple vitamin) .... Take 1 tablet by mouth once a day 2)  Vitamin E 1000 Unit Caps (Vitamin e) .... Take 1 capsule by mouth once a day 3)  Aspirin 81 Mg Tbec (Aspirin) .... Take 1 tablet by mouth once a day 4)  Celebrex 200 Mg Caps (Celecoxib) .... Take 1 capsule by mouth once a day 5)  Tylenol Pm Extra Strength Tabs (Diphenhydramine-apap (sleep) tabs) .... Take 1 tab by mouth at bedtime 6)  Furosemide 20 Mg Tabs (Furosemide) .... Take 1 tablet by mouth once a day as needed swelling 7)  Simvastatin  40 Mg Tabs (Simvastatin) .... Take 1 tablet by mouth once a day 8)  Metoprolol Tartrate 25 Mg Tabs (Metoprolol tartrate) .... Take 1 tablet by mouth every 12 hours 9)  Gabapentin 600 Mg Tabs (Gabapentin) .Marland Kitchen.. 1 tab by mouth at bedtime 10)  Promethegan 25 Mg Supp (Promethazine hcl) .Marland Kitchen.. 1 per rectum q 6 hours as needed for nausea 11)  Losartan Potassium 100 Mg Tabs (Losartan potassium) .Marland Kitchen.. 1 by mouth daily 12)  Chlorthalidone 50 Mg Tabs (Chlorthalidone) .Marland Kitchen.. 1 tab by mouth daily  Patient Instructions: 1)  Hold furosemide. 2)  Start chlorthalidone. 3)  Return for BMET Dx 401.1 in 2 weeks.  4)  Bring or call in BP meassurements in 2 weeks.  5)  Follow up in 1 month. Prescriptions: CHLORTHALIDONE 50 MG TABS (CHLORTHALIDONE) 1 tab by mouth daily  #30 x 11   Entered and Authorized by:   Kerby Nora MD   Signed by:   Kerby Nora MD on 12/12/2009   Method used:   Electronically to        [facility]* (retail)       [address]       [place], [place]  [address]       Ph: [phone]       Fax: [phone]   RxID:   [id number]   Current Allergies (reviewed today): ! PENICILLIN V POTASSIUM (PENICILLIN V POTASSIUM) ! LEVAQUIN (LEVOFLOXACIN)

## 2010-11-14 NOTE — Letter (Signed)
Summary: Pt's Blood Pressure Readings from 12/28/09-01/02/10  Pt's Blood Pressure Readings from 12/28/09-01/02/10   Imported By: Beau Fanny 01/02/2010 16:49:49  _____________________________________________________________________  External Attachment:    Type:   Image     Comment:   External Document  Appended Document: Pt's Blood Pressure Readings from 12/28/09-01/02/10 BP control seems to be improving. Continue current meds. See lab addendum.   Appended Document: Pt's Blood Pressure Readings from 12/28/09-01/02/10 patient advised.Consuello Masse CMA

## 2010-11-14 NOTE — Progress Notes (Signed)
Summary: refill request for gabapentin  Phone Note Refill Request Call back at [facility] Phone [phone] Message from:  Patient  Refills Requested: Medication #1:  GABAPENTIN 600 MG TABS 1 tab by mouth at bedtime Phoned request from pt, she needs [facility] day [facility] Please let pt know when sent.  Initial call taken by: Lowella Petties CMA,  January 17, 2010 8:31 AM  Follow-up for Phone Call        patient advised that rx to the pharmacy electonically Follow-up by: Benny Lennert CMA Duncan Dull),  January 17, 2010 10:09 AM    Prescriptions: GABAPENTIN 600 MG TABS (GABAPENTIN) 1 tab by mouth at bedtime  #90 x 3   Entered by:   Benny Lennert CMA (AAMA)   Authorized by:   Kerby Nora MD   Signed by:   Benny Lennert CMA (AAMA) on 01/17/2010   Method used:   Faxed to ...       [facility]* (mail-order)       [address]       [place], [place]  [address]       Ph: [phone]       Fax: [phone]   RxID:   [id number] GABAPENTIN 600 MG TABS (GABAPENTIN) 1 tab by mouth at bedtime  #90 x 3   Entered and Authorized by:   Kerby Nora MD   Signed by:   Kerby Nora MD on 01/17/2010   Method used:   Electronically to        [facility]* (mail-order)       [address]       [place], [place]  [address]       Ph: [phone]       Fax: [phone]   RxID:   [id number]

## 2010-11-14 NOTE — Letter (Signed)
Summary: BP's  BP's   Imported By: Harlon Flor 04/05/2010 07:50:42  _____________________________________________________________________  External Attachment:    Type:   Image     Comment:   External Document

## 2010-11-14 NOTE — Assessment & Plan Note (Signed)
Summary: 1 MONTH FOLLOW UP/[facility]   Vital Signs:  Patient profile:   77 year old female Height:      62.25 inches Weight:      147.2 pounds O2 Sat:      100 % on Room air Temp:     97.6 degrees F oral Pulse rate:   53 / minute Pulse rhythm:   regular BP sitting:   130 / 80  (left arm) Cuff size:   regular  Vitals Entered By: Benny Lennert CMA Duncan Dull) (January 10, 2010 9:32 AM)  O2 Flow:  Room air  History of Present Illness: Chief complaint 1 month follow up (patient says she has no energy and is wheezing in chest all day)  HTN, improved with addition chlorthalidone 100 mg daily..due for K and Creatinine check.  At home Bps 1390-143/56-60 Last check Cr trending up 1.9 to 2.2 ... baseline usually around 1.4 Not using furosemide...taken off med list.  Still has some ankle swelling intermittantly.   Has noted decrease in energy. In last 2 months has noted wheezing in AMs.Marland Kitchenbut has noted continuation of wheezing all day. No PND. Has history of COPD. Occ dry cough.  Worsening of urinary incontinence last few months. Wearing pad all day for incontinence. With standing up has urine leak. No increase in frequency. No dysuria.  Problems Prior to Update: 1)  Uti  (ICD-599.0) 2)  Essential Hypertension, Benign  (ICD-401.1) 3)  Visual Changes  (ICD-368.10) 4)  Headache  (ICD-784.0) 5)  Other Screening Mammogram  (ICD-V76.12) 6)  Special Screening For Osteoporosis  (ICD-V82.81) 7)  Ganglion Cyst, Wrist, Left  (ICD-727.41) 8)  Special Screening Malig Neoplasms Other Sites  (ICD-V76.49) 9)  Peripheral Neuropathy, Idiopathic  (ICD-356.9) 10)  Fatigue  (ICD-780.79) 11)  Dysuria, Chronic  (ICD-788.1) 12)  Vaginal Mass  (ICD-625.8) 13)  Stress Incontinence  (ICD-788.39) 14)  Venous Insufficiency  (ICD-459.81) 15)  Degeneration, Cervical Disc  (ICD-722.4) 16)  Retinal Vein Occlusion, Blind in Right Eye  (ICD-362.30) 17)  Renal Insufficiency  (ICD-588.9) 18)  Peripheral Vascular Disease   (ICD-443.9) 19)  Osteoarthritis  (ICD-715.90) 20)  Gerd  (ICD-530.81) 21)  Diverticulosis, Colon  (ICD-562.10) 22)  COPD  (ICD-496) 23)  Allergic Rhinitis  (ICD-477.9) 24)  Renal Stone  (ICD-592.9) 25)  Hypercholesterolemia, Pure  (ICD-272.0) 26)  Cardiac Murmur  (ICD-785.2) 27)  Kidney Disease, Chronic, Stage Iii  (ICD-585.3) 28)  Essential Hypertension  (ICD-401.9)  Current Medications (verified): 1)  Multivitamins  Tabs (Multiple Vitamin) .... Take 1 Tablet By Mouth Once A Day 2)  Vitamin E 1000 Unit Caps (Vitamin E) .... Take 1 Capsule By Mouth Once A Day 3)  Aspirin 81 Mg Tbec (Aspirin) .... Take 1 Tablet By Mouth Once A Day 4)  Celebrex 200 Mg Caps (Celecoxib) .... Take 1 Capsule By Mouth Once A Day 5)  Tylenol Pm Extra Strength  Tabs (Diphenhydramine-Apap (Sleep) Tabs) .... Take 1 Tab By Mouth At Bedtime 6)  Simvastatin 40 Mg  Tabs (Simvastatin) .... Take 1 Tablet By Mouth Once A Day 7)  Metoprolol Tartrate 25 Mg  Tabs (Metoprolol Tartrate) .... Take 1 Tablet By Mouth Every 12 Hours 8)  Gabapentin 600 Mg Tabs (Gabapentin) .Marland Kitchen.. 1 Tab By Mouth At Bedtime 9)  Promethegan 25 Mg Supp (Promethazine Hcl) .Marland Kitchen.. 1 Per Rectum Q 6 Hours As Needed For Nausea 10)  Losartan Potassium 100 Mg Tabs (Losartan Potassium) .Marland Kitchen.. 1 By Mouth Daily 11)  Chlorthalidone 100 Mg Tabs (Chlorthalidone) .... 1/2 Tab  By Mouth Daily 12)  Ciprofloxacin Hcl 500 Mg Tabs (Ciprofloxacin Hcl) .Marland Kitchen.. 1 Tab By Mouth Two Times A Day X 7 Days 13)  Spiriva Handihaler 18 Mcg Caps (Tiotropium Bromide Monohydrate) .Marland Kitchen.. 1 Cap  Inhalation Daily  Allergies: 1)  ! Penicillin V Potassium (Penicillin V Potassium) 2)  ! Levaquin (Levofloxacin)  Past History:  Past medical, surgical, family and social histories (including risk factors) reviewed, and no changes noted (except as noted below).  Past Medical History: Reviewed history from 05/05/2007 and no changes required. Allergic rhinitis COPD Diverticulosis,  colon GERD Hyperlipidemia Hypertension Osteoarthritis Peripheral vascular disease Renal insufficiency  Past Surgical History: Reviewed history from 05/05/2007 and no changes required. 2003      Hospitalized colitis, pseudomembranous - C.Diff 1970      Total hysterectomy - suspicious cells 1980      Cholecystectomy 1999      Stress test - ischemia 1999      Cath - nml. 2000      PFT's 2005      C-spine - cerv. disc  dz.  C5-C7, facet arthritis 2004      Abd. CT (-) 04/2005   ABI nml 05/2006   CXR, COPD, osteopenia 11/2006   DXA nml. T + 1.32  Family History: Reviewed history from 05/05/2007 and no changes required. Father: Died 72, dementia Mother: Died 14, complete heart block Siblings: 6 brothers, lung CA, sinus CA 2 sisters, one with Colon CA, one with dementia CV:  (+) HBP:  (+) DM:  Brother Colon CA:  Sister  Social History: Reviewed history from 05/05/2007 and no changes required. Former Smoker Alcohol use-yes, light Drug use-no Marital Status: Married, 64 years Children: 3 daughters, 1 Charity fundraiser Occupation: Retired, Therapist, nutritional Exercise:  Golf Diet:  3 meals a day  Review of Systems General:  Complains of fatigue. CV:  Denies chest pain or discomfort. Resp:  Complains of shortness of breath and wheezing; denies cough and sputum productive. GI:  Denies abdominal pain. GU:  Complains of incontinence and nocturia; denies dysuria, hematuria, urinary frequency, and urinary hesitancy.  Physical Exam  General:  elderly female in NAD Mouth:  MMM Neck:  no carotid bruit or thyromegaly no cervical or supraclavicular lymphadenopathy  Lungs:  Normal respiratory effort, chest expands symmetrically. Lungs are clear to auscultation, no crackles or wheezes. Heart:  Normal rate and regular rhythm. S1 and S2 normal without gallop, murmur, click, rub or other extra sounds. Abdomen:  Bowel sounds positive,abdomen soft and non-tender without masses, organomegaly or hernias  noted. No CVA tenderness Pulses:  R and L posterior tibial pulses are full and equal bilaterally  Extremities:  trace left pedal edema and trace right pedal edema.     Impression & Recommendations:  Problem # 1:  UTI (ICD-599.0) MAy have worsened incontinence..traet with antibiotics.Marland Kitchenif incontinence not improved..consider med like detrol for urge incontinence Her updated medication list for this problem includes:    Ciprofloxacin Hcl 500 Mg Tabs (Ciprofloxacin hcl) .Marland Kitchen... 1 tab by mouth two times a day x 7 days  Orders: UA Dipstick w/o Micro (manual) (62952) T-Culture, Urine ([id number]) Specimen Handling (99000)  Problem # 2:  ESSENTIAL HYPERTENSION, BENIGN (ICD-401.1) Difficult to control on multiple medicaitons.  In past maxide ineffective, amlodipine caused severe peripheral swelling, ? intoleratnt of ACE I. She is on max metoprolol with HR limitations, max losartan. Chlorthalidone 100 mg has decreased her renal function significantly with only moderate improvement in BP. Return to 50 mg daily..recheck Cr. Will refer  to cardiologist for recommendations on difficult to treat HTN given SOB.  The following medications were removed from the medication list:    Furosemide 20 Mg Tabs (Furosemide) .Marland Kitchen... Take 1 tablet by mouth once a day as needed swelling Her updated medication list for this problem includes:    Metoprolol Tartrate 25 Mg Tabs (Metoprolol tartrate) .Marland Kitchen... Take 1 tablet by mouth every 12 hours    Losartan Potassium 100 Mg Tabs (Losartan potassium) .Marland Kitchen... 1 by mouth daily    Chlorthalidone 100 Mg Tabs (Chlorthalidone) .Marland Kitchen... 1/2 tab  by mouth daily  Orders: Cardiology Referral (Cardiology)  Problem # 3:  RENAL INSUFFICIENCY (ICD-588.9) Reveval on lower dose of chlorthalidone.   Problem # 4:  COPD (ICD-496) PFTs showed mild obstruction..will add spiriva..recheck in 1 month.  No current infection at this time per symptoms and exam.  Her updated medication list for this  problem includes:    Spiriva Handihaler 18 Mcg Caps (Tiotropium bromide monohydrate) .Marland Kitchen... 1 cap  inhalation daily  Orders: Spirometry w/Graph (94010)  Complete Medication List: 1)  Multivitamins Tabs (Multiple vitamin) .... Take 1 tablet by mouth once a day 2)  Vitamin E 1000 Unit Caps (Vitamin e) .... Take 1 capsule by mouth once a day 3)  Aspirin 81 Mg Tbec (Aspirin) .... Take 1 tablet by mouth once a day 4)  Celebrex 200 Mg Caps (Celecoxib) .... Take 1 capsule by mouth once a day 5)  Tylenol Pm Extra Strength Tabs (Diphenhydramine-apap (sleep) tabs) .... Take 1 tab by mouth at bedtime 6)  Simvastatin 40 Mg Tabs (Simvastatin) .... Take 1 tablet by mouth once a day 7)  Metoprolol Tartrate 25 Mg Tabs (Metoprolol tartrate) .... Take 1 tablet by mouth every 12 hours 8)  Gabapentin 600 Mg Tabs (Gabapentin) .Marland Kitchen.. 1 tab by mouth at bedtime 9)  Promethegan 25 Mg Supp (Promethazine hcl) .Marland Kitchen.. 1 per rectum q 6 hours as needed for nausea 10)  Losartan Potassium 100 Mg Tabs (Losartan potassium) .Marland Kitchen.. 1 by mouth daily 11)  Chlorthalidone 100 Mg Tabs (Chlorthalidone) .... 1/2 tab  by mouth daily 12)  Ciprofloxacin Hcl 500 Mg Tabs (Ciprofloxacin hcl) .Marland Kitchen.. 1 tab by mouth two times a day x 7 days 13)  Spiriva Handihaler 18 Mcg Caps (Tiotropium bromide monohydrate) .Marland Kitchen.. 1 cap  inhalation daily  Other Orders: TLB-TSH (Thyroid Stimulating Hormone) ([id number])  Patient Instructions: 1)  Start antibiotic for urinary infection. 2)  Decrease chlorthalidone to 1/2 tab by mouth daily. 3)  Referral to cardiologist. 4)  Start daily Spiriva for COPD. 5)  Follow up in 2 weeks. 6)  Referral Appointment Information 7)  Day/Date: 8)  Time: 9)  Place/MD: 10)  Address: 11)  Phone/Fax: 12)  Patient given appointment information. Information/Orders faxed/mailed.  Prescriptions: SPIRIVA HANDIHALER 18 MCG CAPS (TIOTROPIUM BROMIDE MONOHYDRATE) 1 cap  inhalation daily  #1 x 11   Entered and Authorized by:   Kerby Nora MD   Signed by:   Kerby Nora MD on 01/10/2010   Method used:   Electronically to        [facility]* (retail)       [address]       [place], [place]  [address]       Ph: [phone]       Fax: [phone]   RxID:   [id number] CIPROFLOXACIN HCL 500 MG TABS (CIPROFLOXACIN HCL) 1 tab by mouth two times a day x 7 days  #14  x 0   Entered and Authorized by:   Kerby Nora MD   Signed by:   Kerby Nora MD on 01/10/2010   Method used:   Electronically to        [facility]* (retail)       [address]       [place], [place]  [address]       Ph: [phone]       Fax: [phone]   RxID:   [id number]   Current Allergies (reviewed today): ! PENICILLIN V POTASSIUM (PENICILLIN V POTASSIUM) ! LEVAQUIN (LEVOFLOXACIN)  Laboratory Results   Urine Tests  Date/Time Received: January 10, 2010 10:31 AM  Date/Time Reported: January 10, 2010 10:31 AM   Routine Urinalysis   Color: yellow Appearance: Cloudy Glucose: negative   (Normal Range: Negative) Bilirubin: negative   (Normal Range: Negative) Ketone: negative   (Normal Range: Negative) Spec. Gravity: 1.020   (Normal Range: 1.003-1.035) Blood: large   (Normal Range: Negative) pH: 5.5   (Normal Range: 5.0-8.0) Protein: 30   (Normal Range: Negative) Urobilinogen: 0.2   (Normal Range: 0-1) Nitrite: positive   (Normal Range: Negative) Leukocyte Esterace: moderate   (Normal Range: Negative)

## 2010-11-14 NOTE — Progress Notes (Signed)
Summary: regarding detrol  Phone Note Call from Patient Call back at [facility] Phone [phone]   Caller: Patient Call For: Kerby Nora MD Summary of Call: Pt was started on detrol at last office visit on 4/13.  She is taking this alright, has a dry mouth but no other problems.  She cant tell if this is helping yet.  She is almost out of samples and asks if you want her to continue this.  If so, she [facility]. Initial call taken by: Lowella Petties CMA,  February 09, 2010 2:17 PM  Follow-up for Phone Call        Give more time..will send in rx.   Additional Follow-up for Phone Call Additional follow up Details #1::        [facility]n [facility] and she was advised to give rx more time.Consuello Masse CMA     Prescriptions: DETROL LA 4 MG XR24H-CAP (TOLTERODINE TARTRATE) 1 tab by mouth daily  #90 x 3   Entered by:   Benny Lennert CMA (AAMA)   Authorized by:   Kerby Nora MD   Signed by:   Benny Lennert CMA (AAMA) on 02/10/2010   Method used:   Faxed to ...       [facility])             , [place]         Ph: [phone]       Fax: [phone]   RxID:   [id number] DETROL LA 4 MG XR24H-CAP (TOLTERODINE TARTRATE) 1 tab by mouth daily  #30 x 11   Entered and Authorized by:   Kerby Nora MD   Signed by:   Kerby Nora MD on 02/10/2010   Method used:   Electronically to        [facility]* (retail)       [address]       [place], [place]  [address]       Ph: [phone]       Fax: [phone]   RxID:   [id number]

## 2010-11-14 NOTE — Assessment & Plan Note (Signed)
Summary: HA,CONGESTION/CLE   Vital Signs:  Patient profile:   77 year old female Height:      62.25 inches Weight:      149.2 pounds BMI:     27.17 Temp:     98.1 degrees F oral Pulse rate:   68 / minute Pulse rhythm:   regular BP sitting:   140 / 78  (left arm) Cuff size:   large  Vitals Entered By: Benny Lennert CMA Duncan Dull) (March 20, 2010 12:20 PM)  History of Present Illness: Chief complaint Headache congestion and fever  77 year old female with a history of COPD, long former smoking history, now SOB with some wheezing and fever.  Has been short of breath and had a headache all last week mucous production and stuff coming up  at time, felt like she was ahving a fever and running cold  Acute Visit History:      The patient complains of cough, fever, and nasal discharge.  These symptoms began 1 week ago.  She denies abdominal pain, chest pain, earache, eye symptoms, nausea, and rash.        The patient notes wheezing and shortness of breath.  The cough interferes with her sleep.  The character of the cough is described as productive.  She has a history of COPD.  There is no history of respiratory retractions, tachypnea, cyanosis, or interference with oral intake associated with her cough.        Urine output has been normal.  She is tolerating clear liquids.        Allergies: 1)  ! Penicillin V Potassium (Penicillin V Potassium) 2)  ! Levaquin (Levofloxacin)  Past History:  Past medical, surgical, family and social histories (including risk factors) reviewed, and no changes noted (except as noted below).  Past Medical History: Reviewed history from 03/02/2010 and no changes required. 1. Allergic rhinitis 2. COPD 3. Diverticulosis, colon 4. GERD 5. Hyperlipidemia 6. Hypertension: Lower extremity swelling with amlodipine, intolerant of ACEIs 7. Osteoarthritis 8. CKD 9. Impaired vision right eye, was told this was due to HTN 10.  Normal cath in 1999 11.  LBBB 12.   Diastolic CHF: Echo (4/11) with EF 60-65%, mild LVH, mild aortic insufficiency, mild MR, PA systolic pressure 37-41 mmHg, mild to moderate TR.   Past Surgical History: Reviewed history from 05/05/2007 and no changes required. 2003      Hospitalized colitis, pseudomembranous - C.Diff 1970      Total hysterectomy - suspicious cells 1980      Cholecystectomy 1999      Stress test - ischemia 1999      Cath - nml. 2000      PFT's 2005      C-spine - cerv. disc  dz.  C5-C7, facet arthritis 2004      Abd. CT (-) 04/2005   ABI nml 05/2006   CXR, COPD, osteopenia 11/2006   DXA nml. T + 1.32  Family History: Reviewed history from 05/05/2007 and no changes required. Father: Died 25, dementia Mother: Died 58, complete heart block Siblings: 6 brothers, lung CA, sinus CA 2 sisters, one with Colon CA, one with dementia CV:  (+) HBP:  (+) DM:  Brother Colon CA:  Sister  Social History: Reviewed history from 01/26/2010 and no changes required. Former Smoker: > 1 ppd, quit 1986 Alcohol use-yes, light Drug use-no Lives alone Children: 3 daughters, 1 RN Occupation: Retired, Therapist, nutritional Exercise:  Golf Diet:  3 meals a day  Review of Systems       REVIEW OF SYSTEMS GEN: Acute illness details above. GI: No noted N or V Otherwise, pertinent positives and negatives are noted in the HPI.   Physical Exam  General:  elderly female in NAD Head:  Normocephalic and atraumatic without obvious abnormalities. No apparent alopecia or balding. Ears:  External ear exam shows no significant lesions or deformities.  Otoscopic examination reveals clear canals, tympanic membranes are intact bilaterally without bulging, retraction, inflammation or discharge. Hearing is grossly normal bilaterally. Nose:  External nasal examination shows no deformity or inflammation. Nasal mucosa are pink and moist without lesions or exudates. Neck:  No deformities, masses, or tenderness noted. Lungs:  normal respiratory  effort, no intercostal retractions, and no accessory muscle use.  scattered coarse breath sounds with intermittent rare crackles, but without focal crackles in one lobe.  Heart:  Normal rate and regular rhythm. S1 and S2 normal without gallop, murmur, click, rub or other extra sounds. Cervical Nodes:  No lymphadenopathy noted Psych:  Cognition and judgment appear intact. Alert and cooperative with normal attention span and concentration. No apparent delusions, illusions, hallucinations   Impression & Recommendations:  Problem # 1:  ACUTE BRONCHITIS (ICD-466.0) Assessment New COPD exac with likely infectious cause steroids, abx, albuterol  Her updated medication list for this problem includes:    Azithromycin 250 Mg Tabs (Azithromycin) .Marland Kitchen... 2 by  mouth today and then 1 daily for 4 days    Proair Hfa 108 (90 Base) Mcg/act Aers (Albuterol sulfate) .Marland Kitchen... 2 inh q4h as needed shortness of breath  Orders: Prescription Created Electronically [phone])  Problem # 2:  COPD (ICD-496) Assessment: Deteriorated  Her updated medication list for this problem includes:    Proair Hfa 108 (90 Base) Mcg/act Aers (Albuterol sulfate) .Marland Kitchen... 2 inh q4h as needed shortness of breath  Orders: Prescription Created Electronically [phone])  Complete Medication List: 1)  Multivitamins Tabs (Multiple vitamin) .... Take 1 tablet by mouth once a day 2)  Vitamin E 1000 Unit Caps (Vitamin e) .... Take 1 capsule by mouth once a day 3)  Aspirin 81 Mg Tbec (Aspirin) .... Take 1 tablet by mouth once a day 4)  Celebrex 200 Mg Caps (Celecoxib) .... Take 1 capsule by mouth once a day 5)  Tylenol Pm Extra Strength Tabs (Diphenhydramine-apap (sleep) tabs) .... Take 1 tab by mouth at bedtime 6)  Simvastatin 40 Mg Tabs (Simvastatin) .... Take 1 tablet by mouth once a day 7)  Bystolic 10 Mg Tabs (Nebivolol hcl) .... Take 1 tablet by mouth once a day 8)  Gabapentin 600 Mg Tabs (Gabapentin) .Marland Kitchen.. 1 tab by mouth at bedtime 9)   Promethegan 25 Mg Supp (Promethazine hcl) .Marland Kitchen.. 1 per rectum q 6 hours as needed for nausea 10)  Losartan Potassium 100 Mg Tabs (Losartan potassium) .... Take 1/2 tablet daily 11)  Chlorthalidone 100 Mg Tabs (Chlorthalidone) .... 1/4 tab  by mouth daily 12)  Hydralazine Hcl 25 Mg Tabs (Hydralazine hcl) .... Take one tablet by mouth three times a day 13)  Enablex 15 Mg Xr24h-tab (Darifenacin hydrobromide) .Marland Kitchen.. 1 tab by mouth daily 14)  Hydralazine Hcl 50 Mg Tabs (Hydralazine hcl) .... Take one tablet by mouth three times a day 15)  Azithromycin 250 Mg Tabs (Azithromycin) .... 2 by  mouth today and then 1 daily for 4 days 16)  Prednisone 20 Mg Tabs (Prednisone) .... 2 tabs by mouth x 4 days, then 1 tab by mouth x 3 days 17)  Proair Hfa 108 (90 Base) Mcg/act Aers (Albuterol sulfate) .... 2 inh q4h as needed shortness of breath Prescriptions: PROAIR HFA 108 (90 BASE) MCG/ACT  AERS (ALBUTEROL SULFATE) 2 inh q4h as needed shortness of breath  #1 x 0   Entered and Authorized by:   Hannah Beat MD   Signed by:   Hannah Beat MD on 03/20/2010   Method used:   Electronically to        [facility]* (retail)       [address]       [place], [place]  [address]       Ph: [phone]       Fax: [phone]   RxID:   [id number] PREDNISONE 20 MG TABS (PREDNISONE) 2 tabs by mouth x 4 days, then 1 tab by mouth x 3 days  #11 x 0   Entered and Authorized by:   Hannah Beat MD   Signed by:   Hannah Beat MD on 03/20/2010   Method used:   Electronically to        [facility]* (retail)       [address]       [place], [place]  [address]       Ph: [phone]       Fax: [phone]   RxID:   [id number] AZITHROMYCIN 250 MG  TABS (AZITHROMYCIN) 2 by  mouth today and then 1 daily for 4 days  #6 x 0   Entered and Authorized by:   Hannah Beat MD   Signed by:   Hannah Beat MD on 03/20/2010   Method used:   Electronically to         [facility]* (retail)       [address]       [place], [place]  [address]       Ph: [phone]       Fax: [phone]   RxID:   [id number]   Current Allergies (reviewed today): ! PENICILLIN V POTASSIUM (PENICILLIN V POTASSIUM) ! LEVAQUIN (LEVOFLOXACIN)

## 2010-11-14 NOTE — Progress Notes (Signed)
Summary: alternatives to detrol  Phone Note Call from Patient Call back at Home Phone [phone]   Caller: Patient Call For: Kerby Nora MD Summary of Call: Pt checked with her insurance and oxybutynin is the least costly on tier 1, followed by vesicare and enablex on tier 2.  [facility] and [facility] know. Initial call taken by: Lowella Petties CMA,  Feb 28, 2010 2:17 PM  Follow-up for Phone Call        oxybutynin..not good option.Marland Kitchenlots of SE and has to take 30-4 times a day.  Will send in enablex.  Follow-up by: Kerby Nora MD,  Feb 28, 2010 3:18 PM    New/Updated Medications: ENABLEX 15 MG XR24H-TAB (DARIFENACIN HYDROBROMIDE) 1 tab by mouth daily Prescriptions: ENABLEX 15 MG XR24H-TAB (DARIFENACIN HYDROBROMIDE) 1 tab by mouth daily  #90 x 3   Entered by:   Benny Lennert CMA (AAMA)   Authorized by:   Kerby Nora MD   Signed by:   Benny Lennert CMA (AAMA) on 02/28/2010   Method used:   Faxed to ...       [facility]             , [place]         Ph:        Fax: [phone]   RxID:   [id number] ENABLEX 15 MG XR24H-TAB (DARIFENACIN HYDROBROMIDE) 1 tab by mouth daily  #30 x 11   Entered and Authorized by:   Kerby Nora MD   Signed by:   Kerby Nora MD on 02/28/2010   Method used:   Electronically to        [facility]* (retail)       [address]       [place], [place]  [address]       Ph: [phone]       Fax: [phone]   RxID:   [id number]

## 2010-11-14 NOTE — Letter (Signed)
Summary: Pt's list of Blood Pressure Readings-12/13/09-12/25/09  Pt's list of Blood Pressure Readings-12/13/09-12/25/09   Imported By: Beau Fanny 12/26/2009 09:22:45  _____________________________________________________________________  External Attachment:    Type:   Image     Comment:   External Document  Appended Document: Pt's list of Blood Pressure Readings-12/13/09-12/25/09 Improvement in BPs.Marland Kitchenawait potassium results..will likely increase chlorthalidone, but may need potassium supplement.   Appended Document: Pt's list of Blood Pressure Readings-12/13/09-12/25/09 potassium back will call with all results..Please send back to me.Consuello Masse CMA   Appended Document: Pt's list of Blood Pressure Readings-12/13/09-12/25/09 Notify pt potassium in normal range, even on high side. Okay to increase chlorthalidone to 100 mg daily...recheck BMET in 1 week Dx 401.1 Call with Bp measurements in 1-2 weeks.  Change in med list please.   Appended Document: Pt's list of Blood Pressure Readings-12/13/09-12/25/09 PATIENT ADVISED AND APPT MADE

## 2010-11-14 NOTE — Progress Notes (Signed)
Summary: Need Rx Detrol  Phone Note Call from Patient   Caller: Patient Call For: [phone] Summary of Call: Pt called says Dextrol medication was to be called into the pharmacy on Friday. Never received. Need precription sent to [facility].Daine Gip  Feb 27, 2010 4:28 PM  Initial call taken by: Daine Gip,  Feb 27, 2010 4:28 PM  Follow-up for Phone Call        Advised pt script was sent to [facility] on 4/29, will [facility] to [facility] today. Follow-up by: Lowella Petties CMA,  Feb 27, 2010 4:47 PM    Prescriptions: DETROL LA 4 MG XR24H-CAP (TOLTERODINE TARTRATE) 1 tab by mouth daily  #90 x 3   Entered by:   Lowella Petties CMA   Authorized by:   Kerby Nora MD   Signed by:   Lowella Petties CMA on 02/27/2010   Method used:   Electronically to        [facility]* (mail-order)       [address]       [place], [place]  [address]       Ph: [phone]       Fax: [phone]   RxID:   [id number]

## 2010-11-14 NOTE — Assessment & Plan Note (Signed)
Summary: congestion/hmw   Vital Signs:  Patient profile:   77 year old female Height:      62.25 inches Weight:      142.75 pounds BMI:     25.99 Temp:     98.2 degrees F oral Pulse rate:   60 / minute Pulse rhythm:   regular BP sitting:   120 / 60  (right arm) Cuff size:   regular  Vitals Entered By: Linde Gillis CMA Duncan Dull) (September 19, 2010 3:35 PM) CC: congestion   History of Present Illness: Breathing doing well...cough resolved. No fever. She does have congestion in throat, post nasal drip when waking up in throat...improved thorugh the day. Ongoing since MAy, gradually improving. Occ sneeze, no itchy eyes. No rhinnorhea.  Has tried nasal saline, mucinex and zyrtec with minimal benefit.  Worst when she wakes up in AM.  Mucus stays in her throat, post nasal drip.  Able to wsh mucus out of nose with saline.  No change in breathing, no chest pain, no fever, no cough.  Problems Prior to Update: 1)  Hyperkalemia  (ICD-276.7) 2)  Encounter For Long-term Use of Other Medications  (ICD-V58.69) 3)  Diastolic Heart Failure, Chronic  (ICD-428.32) 4)  Carotid Bruit, Right  (ICD-785.9) 5)  Incontinence, Urge  (ICD-788.31) 6)  Other Screening Mammogram  (ICD-V76.12) 7)  Special Screening For Osteoporosis  (ICD-V82.81) 8)  Special Screening Malig Neoplasms Other Sites  (ICD-V76.49) 9)  Peripheral Neuropathy, Idiopathic  (ICD-356.9) 10)  Dysuria, Chronic  (ICD-788.1) 11)  Stress Incontinence  (ICD-788.39) 12)  Venous Insufficiency  (ICD-459.81) 13)  Degeneration, Cervical Disc  (ICD-722.4) 14)  Retinal Vein Occlusion, Blind in Right Eye  (ICD-362.30) 15)  Peripheral Vascular Disease  (ICD-443.9) 16)  Osteoarthritis  (ICD-715.90) 17)  Gerd  (ICD-530.81) 18)  Diverticulosis, Colon  (ICD-562.10) 19)  COPD  (ICD-496) 20)  Allergic Rhinitis  (ICD-477.9) 21)  Renal Stone  (ICD-592.9) 22)  Hypercholesterolemia, Pure  (ICD-272.0) 23)  Cardiac Murmur  (ICD-785.2) 24)  Kidney  Disease, Chronic, Stage Iii  (ICD-585.3) 25)  Essential Hypertension  (ICD-401.9)  Current Medications (verified): 1)  Vitamin E 1000 Unit Caps (Vitamin E) .... Take 1 Capsule By Mouth Once A Day 2)  Aspirin 81 Mg Tbec (Aspirin) .... Take 1 Tablet By Mouth Once A Day 3)  Meloxicam 15 Mg Tabs (Meloxicam) .... Take 1 Tablet By Mouth Once A Day 4)  Tylenol Pm Extra Strength  Tabs (Diphenhydramine-Apap (Sleep) Tabs) .... Take 1 Tab By Mouth At Bedtime 5)  Bystolic 10 Mg Tabs (Nebivolol Hcl) .... Take 1 Tablet By Mouth Once A Day 6)  Gabapentin 600 Mg Tabs (Gabapentin) .Marland Kitchen.. 1 Tab By Mouth At Bedtime 7)  Enablex 15 Mg Xr24h-Tab (Darifenacin Hydrobromide) .Marland Kitchen.. 1 Tab By Mouth Daily 8)  Hydralazine Hcl 50 Mg Tabs (Hydralazine Hcl) .... Take One Tablet By Mouth Three Times A Day 9)  Proventil Hfa 108 (90 Base) Mcg/act Aers (Albuterol Sulfate) .... 2 Puffs Every 4 Hours 10)  Ceftin 250 Mg Tabs (Cefuroxime Axetil) .... One Tablet 2 Times Daily 11)  Mucinex 600 Mg Xr12h-Tab (Guaifenesin) .... One Tablet 2 Times Daily 12)  Norvasc 5 Mg Tabs (Amlodipine Besylate) .... One Tablet Daily 13)  Spiriva Handihaler 18 Mcg Caps (Tiotropium Bromide Monohydrate) .Marland Kitchen.. 1 Inhalation Daily  Allergies: 1)  ! Penicillin V Potassium (Penicillin V Potassium) 2)  ! Levaquin (Levofloxacin)  Past History:  Past medical, surgical, family and social histories (including risk factors) reviewed, and no changes noted (  except as noted below).  Past Medical History: Reviewed history from 03/02/2010 and no changes required. 1. Allergic rhinitis 2. COPD 3. Diverticulosis, colon 4. GERD 5. Hyperlipidemia 6. Hypertension: Lower extremity swelling with amlodipine, intolerant of ACEIs 7. Osteoarthritis 8. CKD 9. Impaired vision right eye, was told this was due to HTN 10.  Normal cath in 1999 11.  LBBB 12.  Diastolic CHF: Echo (4/11) with EF 60-65%, mild LVH, mild aortic insufficiency, mild MR, PA systolic pressure 37-41 mmHg,  mild to moderate TR.   Past Surgical History: Reviewed history from 05/05/2007 and no changes required. 2003      Hospitalized colitis, pseudomembranous - C.Diff 1970      Total hysterectomy - suspicious cells 1980      Cholecystectomy 1999      Stress test - ischemia 1999      Cath - nml. 2000      PFT's 2005      C-spine - cerv. disc  dz.  C5-C7, facet arthritis 2004      Abd. CT (-) 04/2005   ABI nml 05/2006   CXR, COPD, osteopenia 11/2006   DXA nml. T + 1.32  Family History: Reviewed history from 05/05/2007 and no changes required. Father: Died 74, dementia Mother: Died 16, complete heart block Siblings: 6 brothers, lung CA, sinus CA 2 sisters, one with Colon CA, one with dementia CV:  (+) HBP:  (+) DM:  Brother Colon CA:  Sister  Social History: Reviewed history from 01/26/2010 and no changes required. Former Smoker: > 1 ppd, quit 1986 Alcohol use-yes, light Drug use-no Lives alone Children: 3 daughters, 1 RN Occupation: Retired, Therapist, nutritional Exercise:  Golf Diet:  3 meals a day  Review of Systems General:  Denies fatigue and fever. CV:  Denies chest pain or discomfort. Resp:  Denies shortness of breath.  Physical Exam  General:  elderly female in NAD Ears:  External ear exam shows no significant lesions or deformities.  Otoscopic examination reveals clear canals, tympanic membranes are intact bilaterally without bulging, retraction, inflammation or discharge. Hearing is grossly normal bilaterally. Nose:  nasal discharge, mucosal pallor.   Mouth:  Copius shote grey mucus in posterior throat Neck:  no cervical or supraclavicular lymphadenopathy  Lungs:  Normal respiratory effort, chest expands symmetrically. Lungs are clear to auscultation, no crackles or wheezes. Heart:  Normal rate and regular rhythm. S1 and S2 normal without gallop, murmur, click, rub or other extra sounds.   Impression & Recommendations:  Problem # 1:  ALLERGIC RHINITIS  (ICD-477.9) Post nasl drip most likely cause of congestionin throat. Cahnge to allegra and add nasal steroid spray. Conitnue nasal irrigationa nd mucolytic. if not improving consider referral to ENT for further eval. Her updated medication list for this problem includes:    [place]ra Allergy 180 Mg Tabs (Fexofenadine hcl) .Marland Kitchen... 1 tab by mouth at bedtime    Fluticasone Propionate 50 Mcg/act Susp (Fluticasone propionate) .Marland Kitchen... 2 sprays per nostril before bed.  Complete Medication List: 1)  Vitamin E 1000 Unit Caps (Vitamin e) .... Take 1 capsule by mouth once a day 2)  Aspirin 81 Mg Tbec (Aspirin) .... Take 1 tablet by mouth once a day 3)  Meloxicam 15 Mg Tabs (Meloxicam) .... Take 1 tablet by mouth once a day 4)  Tylenol Pm Extra Strength Tabs (Diphenhydramine-apap (sleep) tabs) .... Take 1 tab by mouth at bedtime 5)  Bystolic 10 Mg Tabs (Nebivolol hcl) .... Take 1 tablet by mouth once a day 6)  Gabapentin 600 Mg Tabs (Gabapentin) .Marland Kitchen.. 1 tab by mouth at bedtime 7)  Enablex 15 Mg Xr24h-tab (Darifenacin hydrobromide) .Marland Kitchen.. 1 tab by mouth daily 8)  Hydralazine Hcl 50 Mg Tabs (Hydralazine hcl) .... Take one tablet by mouth three times a day 9)  Proventil Hfa 108 (90 Base) Mcg/act Aers (Albuterol sulfate) .... 2 puffs every 4 hours 10)  Ceftin 250 Mg Tabs (Cefuroxime axetil) .... One tablet 2 times daily 11)  Mucinex 600 Mg Xr12h-tab (Guaifenesin) .... One tablet 2 times daily 12)  Norvasc 5 Mg Tabs (Amlodipine besylate) .... One tablet daily 13)  Spiriva Handihaler 18 Mcg Caps (Tiotropium bromide monohydrate) .Marland Kitchen.. 1 inhalation daily 14)  [place]ra Allergy 180 Mg Tabs (Fexofenadine hcl) .Marland Kitchen.. 1 tab by mouth at bedtime 15)  Fluticasone Propionate 50 Mcg/act Susp (Fluticasone propionate) .... 2 sprays per nostril before bed.  Patient Instructions: 1)  Stop zyrtec.. change to allegra at bedtime.  2)  Continue nasal saline irrigation three times a day  3)  Add nasal steroid spray.. fluticasone.  4)   Call  if not improving in 2 weeks for ENT referral.  Prescriptions: FLUTICASONE PROPIONATE 50 MCG/ACT SUSP (FLUTICASONE PROPIONATE) 2 sprays per nostril before bed.  #1 x 3   Entered and Authorized by:   Kerby Nora MD   Signed by:   Kerby Nora MD on 09/19/2010   Method used:   Electronically to        [facility]* (retail)       [address]       [place], [place]  [address]       Ph: [phone]       Fax: [phone]   RxID:   [id number]    Orders Added: 1)  Est. Patient Level III [84132]    Current Allergies (reviewed today): ! PENICILLIN V POTASSIUM (PENICILLIN V POTASSIUM) ! LEVAQUIN (LEVOFLOXACIN)

## 2010-11-14 NOTE — Progress Notes (Signed)
Summary: REFILL Chlorthalidone   Phone Note Refill Request Message from:  Patient on April 28, 2010 2:16 PM  Refills Requested: Medication #1:  CHLORTHALIDONE 25 MG TABS Take1/2 tablet by mouth daily [facility] PATIENT NEEDS A 90DAY SUPPLY  Initial call taken by: [place] Carbo,  April 28, 2010 2:16 PM    Prescriptions: CHLORTHALIDONE 25 MG TABS (CHLORTHALIDONE) Take1/2 tablet by mouth daily  #45 x 4   Entered by:   Hardin Negus, RMA   Authorized by:   Dossie Arbour MD   Signed by:   Hardin Negus, RMA on 04/28/2010   Method used:   Electronically to        [facility]* (mail-order)       [address]       [place], [place]  [address]       Ph: [phone]       Fax: [phone]   RxID:   [id number]

## 2010-11-14 NOTE — Assessment & Plan Note (Signed)
Summary: 30 MIN APPT 1 MONTH FOLLOW UP/[facility]   Vital Signs:  Patient profile:   77 year old female Height:      62.25 inches Weight:      143.6 pounds BMI:     26.15 O2 Sat:      98 % on Room air Temp:     98.5 degrees F oral Pulse rate:   64 / minute Pulse rhythm:   regular Resp:     16 per minute BP sitting:   130 / 90  (left arm) Cuff size:   regular  Vitals Entered By: Benny Lennert CMA (AAMA) (June 02, 2010 9:20 AM)  O2 Flow:  Room air  History of Present Illness: .Chief complaint 1 month follow up  77 year old female with a history of COPD, long former smoking history, here for follow up bronchitis/COPD exacerbation.  Saw Dr. Patsy Lager on 6/6, treated with Zpack and short course prednisone.  All symptoms have resolved- SOB, wheezing and fever but coughed up some blood tinged mucous 8/3.  Seen in OV by Dr. Dayton Martes 8/4...exam nml pt reassured.  Today she returnd for 1 month follow up...she states she never felt totally resolved  then 2 days ago she states that she began having chills, fever 100- 102.  Every morning still has mucus. No cough, baseline SOB. No further bloody mucus. No dysuria. No face pain or ear pain. No rash.  HTN, well controlled for age. Leg pain/weakness...decreased chol med... minimal change in leg "weakness" Going up steps makes her legs feel tired. Low back pain.Marland Kitchenintermittantly severe...for years. Using celebrex..helps with back pain.   Problems Prior to Update: 1)  Fever Unspecified  (ICD-780.60) 2)  Shortness of Breath  (ICD-786.05) 3)  Hyperkalemia  (ICD-276.7) 4)  Fatigue  (ICD-780.79) 5)  Encounter For Long-term Use of Other Medications  (ICD-V58.69) 6)  Acute Bronchitis  (ICD-466.0) 7)  Diastolic Heart Failure, Chronic  (ICD-428.32) 8)  Carotid Bruit, Right  (ICD-785.9) 9)  Incontinence, Urge  (ICD-788.31) 10)  Visual Changes  (ICD-368.10) 11)  Other Screening Mammogram  (ICD-V76.12) 12)  Special Screening For Osteoporosis   (ICD-V82.81) 13)  Ganglion Cyst, Wrist, Left  (ICD-727.41) 14)  Special Screening Malig Neoplasms Other Sites  (ICD-V76.49) 15)  Peripheral Neuropathy, Idiopathic  (ICD-356.9) 16)  Dysuria, Chronic  (ICD-788.1) 17)  Stress Incontinence  (ICD-788.39) 18)  Venous Insufficiency  (ICD-459.81) 19)  Degeneration, Cervical Disc  (ICD-722.4) 20)  Retinal Vein Occlusion, Blind in Right Eye  (ICD-362.30) 21)  Renal Insufficiency  (ICD-588.9) 22)  Peripheral Vascular Disease  (ICD-443.9) 23)  Osteoarthritis  (ICD-715.90) 24)  Gerd  (ICD-530.81) 25)  Diverticulosis, Colon  (ICD-562.10) 26)  COPD  (ICD-496) 27)  Allergic Rhinitis  (ICD-477.9) 28)  Renal Stone  (ICD-592.9) 29)  Hypercholesterolemia, Pure  (ICD-272.0) 30)  Cardiac Murmur  (ICD-785.2) 31)  Kidney Disease, Chronic, Stage Iii  (ICD-585.3) 32)  Essential Hypertension  (ICD-401.9)  Current Medications (verified): 1)  Vitamin E 1000 Unit Caps (Vitamin E) .... Take 1 Capsule By Mouth Once A Day 2)  Aspirin 81 Mg Tbec (Aspirin) .... Take 1 Tablet By Mouth Once A Day 3)  Celebrex 200 Mg Caps (Celecoxib) .... Take 1 Capsule By Mouth Once A Day 4)  Tylenol Pm Extra Strength  Tabs (Diphenhydramine-Apap (Sleep) Tabs) .... Take 1 Tab By Mouth At Bedtime 5)  Simvastatin 40 Mg  Tabs (Simvastatin) .... Take 1/2 Tablet By Mouth Once A Day 6)  Bystolic 10 Mg Tabs (Nebivolol Hcl) .... Take  1 Tablet By Mouth Once A Day 7)  Gabapentin 600 Mg Tabs (Gabapentin) .Marland Kitchen.. 1 Tab By Mouth At Bedtime 8)  Losartan Potassium 100 Mg Tabs (Losartan Potassium) .... Take 1/2 Tablet Daily 9)  Chlorthalidone 25 Mg Tabs (Chlorthalidone) .... Take1/2 Tablet By Mouth Daily 10)  Enablex 15 Mg Xr24h-Tab (Darifenacin Hydrobromide) .Marland Kitchen.. 1 Tab By Mouth Daily 11)  Hydralazine Hcl 50 Mg Tabs (Hydralazine Hcl) .... Take One Tablet By Mouth Three Times A Day  Allergies: 1)  ! Penicillin V Potassium (Penicillin V Potassium) 2)  ! Levaquin (Levofloxacin)  Past History:  Past  medical, surgical, family and social histories (including risk factors) reviewed, and no changes noted (except as noted below).  Past Medical History: Reviewed history from 03/02/2010 and no changes required. 1. Allergic rhinitis 2. COPD 3. Diverticulosis, colon 4. GERD 5. Hyperlipidemia 6. Hypertension: Lower extremity swelling with amlodipine, intolerant of ACEIs 7. Osteoarthritis 8. CKD 9. Impaired vision right eye, was told this was due to HTN 10.  Normal cath in 1999 11.  LBBB 12.  Diastolic CHF: Echo (4/11) with EF 60-65%, mild LVH, mild aortic insufficiency, mild MR, PA systolic pressure 37-41 mmHg, mild to moderate TR.   Past Surgical History: Reviewed history from 05/05/2007 and no changes required. 2003      Hospitalized colitis, pseudomembranous - C.Diff 1970      Total hysterectomy - suspicious cells 1980      Cholecystectomy 1999      Stress test - ischemia 1999      Cath - nml. 2000      PFT's 2005      C-spine - cerv. disc  dz.  C5-C7, facet arthritis 2004      Abd. CT (-) 04/2005   ABI nml 05/2006   CXR, COPD, osteopenia 11/2006   DXA nml. T + 1.32  Family History: Reviewed history from 05/05/2007 and no changes required. Father: Died 49, dementia Mother: Died 7, complete heart block Siblings: 6 brothers, lung CA, sinus CA 2 sisters, one with Colon CA, one with dementia CV:  (+) HBP:  (+) DM:  Brother Colon CA:  Sister  Social History: Reviewed history from 01/26/2010 and no changes required. Former Smoker: > 1 ppd, quit 1986 Alcohol use-yes, light Drug use-no Lives alone Children: 3 daughters, 1 RN Occupation: Retired, Therapist, nutritional Exercise:  Golf Diet:  3 meals a day  Review of Systems General:  Complains of fatigue and fever. CV:  Denies chest pain or discomfort. Resp:  Complains of shortness of breath; denies sputum productive and wheezing. GI:  Denies abdominal pain. GU:  Denies dysuria and urinary frequency.  Physical  Exam  General:  elderly female in NAD Head:  Normocephalic and atraumatic without obvious abnormalities. No apparent alopecia or balding. Eyes:  No corneal or conjunctival inflammation noted. EOMI. Perrla. Funduscopic exam benign, without clearhemorrhages, exudates or papilledema. Vision grossly normal in left, absent in right due to past retinal vein occlusion.  Ears:  External ear exam shows no significant lesions or deformities.  Otoscopic examination reveals clear canals, tympanic membranes are intact bilaterally without bulging, retraction, inflammation or discharge. Hearing is grossly normal bilaterally. Nose:  External nasal examination shows no deformity or inflammation. Nasal mucosa are pink and moist without lesions or exudates. Mouth:  MMM Neck:  No deformities, masses, or tenderness noted. Lungs:  normal respiratory effort, no intercostal retractions, and no accessory muscle use. No wheezes or crackles. Heart:  Normal rate and regular rhythm. S1 and S2  normal without gallop, murmur, click, rub or other extra sounds. Abdomen:  Bowel sounds positive,abdomen soft and non-tender without masses, organomegaly or hernias noted. Msk:  PAin in B thighs. Pulses:  R and L posterior tibial pulses are full and equal bilaterally  Extremities:  trace left pedal edema and trace right pedal edema.     Impression & Recommendations:  Problem # 1:  FEVER UNSPECIFIED (ICD-780.60) Likely lung source, but urine looks possibly infected. Send for culture.  Orders: T-2 View CXR (71020TC) UA Dipstick w/o Micro (manual) ([address]) T-Culture, Urine ([id number])  Problem # 2:  SHORTNESS OF BREATH (ICD-786.05) CXR shows ? poneumonia anterior to heart...will send for radiology review. Will treat emperically with doxy (has allergy to fluroquinolones). Orders: T-2 View CXR (71020TC)  Problem # 3:  LEG PAIN, BILATERAL (ICD-729.5) Stop chol med althogether..SE outweigh benifit in 76 year old female.  If no  improvement consider eval of spine.  Problem # 4:  COPD (ICD-496) Stable..no current wheezing.   Complete Medication List: 1)  Vitamin E 1000 Unit Caps (Vitamin e) .... Take 1 capsule by mouth once a day 2)  Aspirin 81 Mg Tbec (Aspirin) .... Take 1 tablet by mouth once a day 3)  Celebrex 200 Mg Caps (Celecoxib) .... Take 1 capsule by mouth once a day 4)  Tylenol Pm Extra Strength Tabs (Diphenhydramine-apap (sleep) tabs) .... Take 1 tab by mouth at bedtime 5)  Bystolic 10 Mg Tabs (Nebivolol hcl) .... Take 1 tablet by mouth once a day 6)  Gabapentin 600 Mg Tabs (Gabapentin) .Marland Kitchen.. 1 tab by mouth at bedtime 7)  Losartan Potassium 100 Mg Tabs (Losartan potassium) .... Take 1/2 tablet daily 8)  Chlorthalidone 25 Mg Tabs (Chlorthalidone) .... Take1/2 tablet by mouth daily 9)  Enablex 15 Mg Xr24h-tab (Darifenacin hydrobromide) .Marland Kitchen.. 1 tab by mouth daily 10)  Hydralazine Hcl 50 Mg Tabs (Hydralazine hcl) .... Take one tablet by mouth three times a day 11)  Doxycycline Hyclate 100 Mg Caps (Doxycycline hyclate) .Marland Kitchen.. 1 tab by mouth two times a day x 10 days  Patient Instructions: 1)  Stop cholesterol medicine. 2)  Start antibiotic two times a day. 3)   Follow up in 7-10 days with [facility]. Prescriptions: DOXYCYCLINE HYCLATE 100 MG CAPS (DOXYCYCLINE HYCLATE) 1 tab by mouth two times a day x 10 days  #20 x 0   Entered and Authorized by:   Kerby Nora MD   Signed by:   Kerby Nora MD on 06/02/2010   Method used:   Electronically to        [facility]* (retail)       [address]       [place], [place]  [address]       Ph: [phone]       Fax: [phone]   RxID:   [id number]   Current Allergies (reviewed today): ! PENICILLIN V POTASSIUM (PENICILLIN V POTASSIUM) ! LEVAQUIN (LEVOFLOXACIN)  Laboratory Results   Urine Tests  Date/Time Received: June 02, 2010 10:32 AM  Date/Time Reported: June 02, 2010 10:32 AM   Routine Urinalysis   Color:  yellow Appearance: Cloudy Glucose: negative   (Normal Range: Negative) Bilirubin: negative   (Normal Range: Negative) Ketone: negative   (Normal Range: Negative) Spec. Gravity: >=1.030   (Normal Range: 1.003-1.035) Blood: large   (Normal Range: Negative) pH: 5.0   (Normal Range: 5.0-8.0) Protein: 30   (Normal Range: Negative) Urobilinogen: 0.2   (Normal Range: 0-1) Nitrite:  positive   (Normal Range: Negative) Leukocyte Esterace: large   (Normal Range: Negative)    Comments: contaminated..unclear if true infection.    Appended Document: 30 MIN APPT 1 MONTH FOLLOW UP/[facility]

## 2010-11-14 NOTE — Progress Notes (Signed)
Summary: pt requesting labs  Phone Note Call from Patient Call back at Home Phone [phone]   Caller: Patient Call For: Kerby Nora MD Summary of Call: Pt is coming in for follow up visit on 7/15 and she is asking if she should have labs prior. Initial call taken by: Lowella Petties CMA,  April 04, 2010 10:38 AM  Follow-up for Phone Call        BMP, CBC, FLP prior, 272.4, v58.69 Follow-up by: Hannah Beat MD,  April 04, 2010 5:07 PM  Additional Follow-up for Phone Call Additional follow up Details #1::        appt made for 04-24-2010 Additional Follow-up by: Benny Lennert CMA Duncan Dull),  April 06, 2010 8:35 AM

## 2010-11-14 NOTE — Assessment & Plan Note (Signed)
Summary: NP6/AMD  Medications Added BYSTOLIC 5 MG TABS (NEBIVOLOL HCL) 1 tab by mouth daily        Visit Type:  new pt visit Primary Provider:  Kerby Nora MD  CC:  edema/ankkles..fatigue...sob w/stairs.Marland Kitchendenies any cp.  History of Present Illness: 77 yo with history of difficult to control HTN, COPD, hyperlipidemia presents for evaluation of HTN.  Patient's BP has been running high.  She was told that her loss of vision in the right eye was due to hypertension.  She has been on metoprolol, losartan, and chlorthalidone.  Her chlorthalidone dose was recently cut back from 100 mg daily to 50 mg daily due to renal insufficiency.  She brings in home BP readings.  SBP has ranged from 139 - 150s.  Once it was 181/71.  Pulse has been running 48-69.   Patient had been having a lot of wheezing with shortness of breath.  This has resolved with use of Spiriva and was thought to be bronchospasm related to COPD.  Patient has been fatigued with poor energy level.  She tires easily.  She is short of breath with a flight of steps.  This has been going on for 2-3 months.  She has been too tired to play golf.  No chest pain.   ECG: NSR, LBBB  Labs (3/11): K 4.7, creatinine 2, TSH normal  Current Medications (verified): 1)  Multivitamins  Tabs (Multiple Vitamin) .... Take 1 Tablet By Mouth Once A Day 2)  Vitamin E 1000 Unit Caps (Vitamin E) .... Take 1 Capsule By Mouth Once A Day 3)  Aspirin 81 Mg Tbec (Aspirin) .... Take 1 Tablet By Mouth Once A Day 4)  Celebrex 200 Mg Caps (Celecoxib) .... Take 1 Capsule By Mouth Once A Day 5)  Tylenol Pm Extra Strength  Tabs (Diphenhydramine-Apap (Sleep) Tabs) .... Take 1 Tab By Mouth At Bedtime 6)  Simvastatin 40 Mg  Tabs (Simvastatin) .... Take 1 Tablet By Mouth Once A Day 7)  Metoprolol Tartrate 25 Mg  Tabs (Metoprolol Tartrate) .... Take 1 Tablet By Mouth Every 12 Hours 8)  Gabapentin 600 Mg Tabs (Gabapentin) .Marland Kitchen.. 1 Tab By Mouth At Bedtime 9)  Promethegan 25 Mg  Supp (Promethazine Hcl) .Marland Kitchen.. 1 Per Rectum Q 6 Hours As Needed For Nausea 10)  Losartan Potassium 100 Mg Tabs (Losartan Potassium) .Marland Kitchen.. 1 By Mouth Daily 11)  Chlorthalidone 100 Mg Tabs (Chlorthalidone) .... 1/2 Tab  By Mouth Daily 12)  Spiriva Handihaler 18 Mcg Caps (Tiotropium Bromide Monohydrate) .Marland Kitchen.. 1 Cap  Inhalation Daily 13)  Detrol La 4 Mg Xr24h-Cap (Tolterodine Tartrate) .Marland Kitchen.. 1 Tab By Mouth Daily  Allergies: 1)  ! Penicillin V Potassium (Penicillin V Potassium) 2)  ! Levaquin (Levofloxacin)  Past History:  Past Medical History: 1. Allergic rhinitis 2. COPD 3. Diverticulosis, colon 4. GERD 5. Hyperlipidemia 6. Hypertension: Lower extremity swelling with amlodipine, intolerant of ACEIs 7. Osteoarthritis 8. CKD 9. Impaired vision right eye, was told this was due to HTN 10.  Normal cath in 1999  Family History: Reviewed history from 05/05/2007 and no changes required. Father: Died 19, dementia Mother: Died 92, complete heart block Siblings: 6 brothers, lung CA, sinus CA 2 sisters, one with Colon CA, one with dementia CV:  (+) HBP:  (+) DM:  Brother Colon CA:  Sister  Social History: Former Smoker: > 1 ppd, quit 1986 Alcohol use-yes, light Drug use-no Lives alone Children: 3 daughters, 1 RN Occupation: Retired, Therapist, nutritional Exercise:  Golf Diet:  3  meals a day  Review of Systems       All systems reviewed and negative except as per HPI.   Vital Signs:  Patient profile:   77 year old female Height:      62.25 inches Weight:      145.8 pounds Pulse rate:   51 / minute Pulse rhythm:   irregular BP sitting:   142 / 60  (left arm) Cuff size:   large  Vitals Entered By: Danielle Rankin, CMA (January 26, 2010 10:15 AM)  Physical Exam  General:  Well developed, well nourished, in no acute distress. Head:  normocephalic and atraumatic Nose:  no deformity, discharge, inflammation, or lesions Mouth:  Teeth, gums and palate normal. Oral mucosa normal. Neck:   Neck supple, no JVD. No masses, thyromegaly or abnormal cervical nodes. Lungs:  End expiratory wheezes, otherwise clear.  Heart:  Non-displaced PMI, chest non-tender; regular rate and rhythm, S1, S2 without murmurs, rubs or gallops. Carotid upstroke normal, no bruit. Pedals normal pulses. 1+ ankle edema.  Abdomen:  Bowel sounds positive; abdomen soft and non-tender without masses, organomegaly, or hernias noted. No hepatosplenomegaly. Msk:  Back normal, normal gait. Muscle strength and tone normal. Extremities:  No clubbing or cyanosis. Neurologic:  Alert and oriented x 3. Skin:  Intact without lesions or rashes. Psych:  Normal affect.   Impression & Recommendations:  Problem # 1:  ESSENTIAL HYPERTENSION, BENIGN (ICD-401.1) BP is mildly elevated today and runs in the 140s-150s primarily at home.  Given the concern from her opthalmologist regarding hypertensive effects on her eyes, I would like to keep her BP lower.  However, some of her fatigue may certainly be due to her BP meds.  I will have her continue current doses of losartan and chlorthalidone for now.  I will check a BMET today and if creatinine is higher, will cut chlorthalidone back to 25 mg daily.  I will stop metoprolol (not a particularly good BP med).  I will start her on nebivolol 5 mg daily (more beta-1 selective so has the potential to cause less bronchospasm and also a better BP-lowering med than metoprolol).  She will continue to monitor pulse and BP with this change.  I will get an echocardiogram given long-standing HTN and fatigue.   Problem # 2:  RENAL INSUFFICIENCY (ICD-588.9) Check BMET today, may need to cut back on chlorthalidone more (get most of chlorthalidone's BP lowering effect at 25 mg daily).   Problem # 3:  FATIGUE (ICD-780.79) Could be due to BP med titration.  TSH was normal.  Will get CBC to ensure she is not anemic.  Echo for LV function.   Other Orders: T-Basic Metabolic Panel [id number]) T-CBC No Diff  ([id number]) Echocardiogram (Echo)  Patient Instructions: 1)  Your physician recommends that you schedule a follow-up appointment in: 4 weeks 2)  Your physician has requested that you have an echocardiogram.  Echocardiography is a painless test that uses sound waves to create images of your heart. It provides your doctor with information about the size and shape of your heart and how well your heart's chambers and valves are working.  This procedure takes approximately one hour. There are no restrictions for this procedure. 3)  Your physician has recommended you make the following change in your medication: stop metoprolol, start bystolic 5 mg daily Prescriptions: BYSTOLIC 5 MG TABS (NEBIVOLOL HCL) 1 tab by mouth daily  #30 x 6   Entered by:   Charlena Cross, RN, BSN  Authorized by:   Marca Ancona, MD   Signed by:   Charlena Cross, RN, BSN on 01/26/2010   Method used:   Electronically to        [facility]* (retail)       [address]       [place], [place]  [address]       Ph: [phone]       Fax: [phone]   RxID:   [id number]

## 2010-11-14 NOTE — Assessment & Plan Note (Signed)
Summary: F1M/AMD  Medications Added CHLORTHALIDONE 25 MG TABS (CHLORTHALIDONE) Take1/2 tablet by mouth daily      Allergies Added:   Visit Type:  Follow-up Primary Provider:  Kerby Nora MD  CC:  dizziness and legs hurt when walking and fatigue.Elizabeth Silva  History of Present Illness: 77 yo with history of difficult to control HTN, COPD, hyperlipidemia follws up for evaluation of HTN.    Ms. Grey states that she had a period of dizziness and weakness though this seems to have resolved. She also had some blurry vision this also has resolved. She is getting over a cold earlier in the month and her blood pressure was high during this period though her blood pressure has improved recently. For the past week, her systolic pressures have been in the 120s to 130 range. heart rates have been in the 50s to 60s range.. She takes chlorthalidone 12.5 mg daily in addition to her other medications.  Echo was suggestive of diastolic dysfunction with normal EF (60-65%).  Patient is not short of breath walking on flat ground but does get short of breath walking up a flight of steps.    Labs (3/11): K 4.7, creatinine 2, TSH normal Labs (4/11): K 4.7, creatinine 2.3 Labs (5/11): K 5.2, creatinine 2.16  Current Medications (verified): 1)  Multivitamins  Tabs (Multiple Vitamin) .... Take 1 Tablet By Mouth Once A Day 2)  Vitamin E 1000 Unit Caps (Vitamin E) .... Take 1 Capsule By Mouth Once A Day 3)  Aspirin 81 Mg Tbec (Aspirin) .... Take 1 Tablet By Mouth Once A Day 4)  Celebrex 200 Mg Caps (Celecoxib) .... Take 1 Capsule By Mouth Once A Day 5)  Tylenol Pm Extra Strength  Tabs (Diphenhydramine-Apap (Sleep) Tabs) .... Take 1 Tab By Mouth At Bedtime 6)  Simvastatin 40 Mg  Tabs (Simvastatin) .... Take 1 Tablet By Mouth Once A Day 7)  Bystolic 10 Mg Tabs (Nebivolol Hcl) .... Take 1 Tablet By Mouth Once A Day 8)  Gabapentin 600 Mg Tabs (Gabapentin) .Elizabeth Silva.. 1 Tab By Mouth At Bedtime 9)  Promethegan 25 Mg Supp  (Promethazine Hcl) .Elizabeth Silva.. 1 Per Rectum Q 6 Hours As Needed For Nausea 10)  Losartan Potassium 100 Mg Tabs (Losartan Potassium) .... Take 1/2 Tablet Daily 11)  Chlorthalidone 100 Mg Tabs (Chlorthalidone) .... 1/4 Tab  By Mouth Daily 12)  Hydralazine Hcl 25 Mg Tabs (Hydralazine Hcl) .... Take One Tablet By Mouth Three Times A Day 13)  Enablex 15 Mg Xr24h-Tab (Darifenacin Hydrobromide) .Elizabeth Silva.. 1 Tab By Mouth Daily 14)  Hydralazine Hcl 50 Mg Tabs (Hydralazine Hcl) .... Take One Tablet By Mouth Three Times A Day  Allergies (verified): 1)  ! Penicillin V Potassium (Penicillin V Potassium) 2)  ! Levaquin (Levofloxacin)  Past History:  Past Medical History: Last updated: 03/02/2010 1. Allergic rhinitis 2. COPD 3. Diverticulosis, colon 4. GERD 5. Hyperlipidemia 6. Hypertension: Lower extremity swelling with amlodipine, intolerant of ACEIs 7. Osteoarthritis 8. CKD 9. Impaired vision right eye, was told this was due to HTN 10.  Normal cath in 1999 11.  LBBB 12.  Diastolic CHF: Echo (4/11) with EF 60-65%, mild LVH, mild aortic insufficiency, mild MR, PA systolic pressure 37-41 mmHg, mild to moderate TR.   Past Surgical History: Last updated: 05/05/2007 2003      Hospitalized colitis, pseudomembranous - C.Diff 1970      Total hysterectomy - suspicious cells 1980      Cholecystectomy 1999      Stress test -  ischemia 1999      Cath - nml. 2000      PFT's 2005      C-spine - cerv. disc  dz.  C5-C7, facet arthritis 2004      Abd. CT (-) 04/2005   ABI nml 05/2006   CXR, COPD, osteopenia 11/2006   DXA nml. T + 1.32  Family History: Last updated: 13-May-2007 Father: Died 73, dementia Mother: Died 57, complete heart block Siblings: 6 brothers, lung CA, sinus CA 2 sisters, one with Colon CA, one with dementia CV:  (+) HBP:  (+) DM:  Brother Colon CA:  Sister  Social History: Last updated: 01/26/2010 Former Smoker: > 1 ppd, quit 1986 Alcohol use-yes, light Drug use-no Lives alone Children:  3 daughters, 1 RN Occupation: Retired, Therapist, nutritional Exercise:  Golf Diet:  3 meals a day  Risk Factors: Smoking Status: quit (05-13-2007)  Review of Systems  The patient denies fever, weight loss, weight gain, vision loss, decreased hearing, hoarseness, chest pain, syncope, dyspnea on exertion, peripheral edema, prolonged cough, abdominal pain, incontinence, muscle weakness, depression, and enlarged lymph nodes.    Vital Signs:  Patient profile:   77 year old female Height:      62.25 inches Weight:      146 pounds BMI:     26.59 Pulse rate:   58 / minute BP sitting:   130 / 64  (left arm) Cuff size:   regular  Vitals Entered By: Bishop Dublin, CMA (April 03, 2010 10:51 AM)  Physical Exam  General:  Well developed, well nourished, in no acute distress. Head:  normocephalic and atraumatic Neck:  Neck supple, no JVD. No masses, thyromegaly or abnormal cervical nodes. Lungs:  Clear bilaterally to auscultation and percussion. Heart:  Non-displaced PMI, chest non-tender; regular rate and rhythm, S1, S2 with II/VI SEM murmur RSB, no rubs or gallops. Carotid upstroke normal, 1-2+ bruit on the right.  Pedals normal pulses. No edema, no varicosities. Abdomen:  Bowel sounds positive; abdomen soft and non-tender without masses Msk:  Back normal, normal gait. Muscle strength and tone normal. Pulses:  pulses normal in all 4 extremities Extremities:  No clubbing or cyanosis. Neurologic:  Alert and oriented x 3. Skin:  Intact without lesions or rashes. Psych:  Normal affect.    Impression & Recommendations:  Problem # 1:  HYPERTENSION, UNSPECIFIED (ICD-401.9) blood pressure appears to be well controlled on her current medication regimen. We have made no changes at this time.  Her updated medication list for this problem includes:    Aspirin 81 Mg Tbec (Aspirin) .Elizabeth Silva... Take 1 tablet by mouth once a day    Bystolic 10 Mg Tabs (Nebivolol hcl) .Elizabeth Silva... Take 1 tablet by mouth once a day     Losartan Potassium 100 Mg Tabs (Losartan potassium) .Elizabeth Silva... Take 1/2 tablet daily    Chlorthalidone 25 Mg Tabs (Chlorthalidone) .Elizabeth Silva... Take1/2 tablet by mouth daily    Hydralazine Hcl 25 Mg Tabs (Hydralazine hcl) .Elizabeth Silva... Take one tablet by mouth three times a day    Hydralazine Hcl 50 Mg Tabs (Hydralazine hcl) .Elizabeth Silva... Take one tablet by mouth three times a day  Problem # 2:  HYPERLIPIDEMIA-MIXED (ICD-272.4) She has a history of hyperlipidemia, recent leg weakness. I suspect he leg weakness is just do to deconditioning. Have asked her to start walking daily. If she continues to have leg weakness, decrease her simvastatin to 20 mg daily.  Her updated medication list for this problem includes:    Simvastatin 40  Mg Tabs (Simvastatin) .Elizabeth Silva... Take 1 tablet by mouth once a day  Problem # 3:  CAROTID BRUIT, RIGHT (ICD-785.9) carotid ultrasound showed no significant disease.  Patient Instructions: 1)  Your physician has recommended you make the following change in your medication: Chlorathaldone 25 mg 1/2 tab daily.  2)  Your physician wants you to follow-up in:  12 months  You will receive a reminder letter in the mail two months in advance. If you don't receive a letter, please call our office to schedule the follow-up appointment. Prescriptions: CHLORTHALIDONE 25 MG TABS (CHLORTHALIDONE) Take1/2 tablet by mouth daily  #15 x 11   Entered by:   Benedict Needy, RN   Authorized by:   Dossie Arbour MD   Signed by:   Benedict Needy, RN on 04/03/2010   Method used:   Electronically to        [facility]* (retail)       [address]       [place], [place]  [address]       Ph: [phone]       Fax: [phone]   RxID:   [id number]

## 2010-11-14 NOTE — Progress Notes (Signed)
Summary: MEDICATIONS   Phone Note Call from Patient Call back at Home Phone [phone]   Caller: SELF Call For: [facility] Summary of Call: PT WOULD LIKE A CALL BACK ABOUT HER MEDICATIONS-WAS UNDER THE IMPRESSION THAT THERE WERE GOING TO BE CHANGES MADE AND THE PATIENT INSTRUCTIONS DO NOT REFLECT THAT Initial call taken by: Harlon Flor,  Mar 02, 2010 4:30 PM  Follow-up for Phone Call        Called spoke with pt. Pt states she thought Dr Shirlee Latch wanted her to incr her Chlorthalidone to 50mg  once daily. Will discuss with Dr Shirlee Latch and call back.   Follow-up by: Cloyde Reams RN,  Mar 02, 2010 4:41 PM  Additional Follow-up for Phone Call Additional follow up Details #1::        Per Dr Shirlee Latch continue on same dosage of Chlorthalidone.  Called spoke with pt, made aware. Additional Follow-up by: Cloyde Reams RN,  Mar 02, 2010 5:25 PM

## 2010-11-14 NOTE — Assessment & Plan Note (Signed)
Summary: 3 M F/U URINARY INCONTINENCE   Vital Signs:  Patient profile:   77 year old female Weight:      147 pounds Temp:     98.8 degrees F oral Pulse rate:   60 / minute Pulse rhythm:   regular BP sitting:   138 / 60  (left arm) Cuff size:   regular  Vitals Entered By: Mervin Hack CMA Duncan Dull) (April 28, 2010 11:26 AM) CC: 3 MONTH FOLLOW-UP   History of Present Illness: 3 month follow up:  Since last visit she states she feels more tired then usual.  No change in SOB, no chest pain. Occ dizzy with standing. No blood in stool. No blood in urine. With last labs.Marland KitchenMarland KitchenHg drop from 12 to 10s.   Normocytic anemia  CKD:  Stable cr at 2.0  Recent OV with Dr. Mariah Milling...recent addition of chlorthalidone.  HTN, well controlled for age...137-166/51-62  Continued left leg pain...will try decreasing cholesterol medicaiton to see if causing SE.      Problems Prior to Update: 1)  Encounter For Long-term Use of Other Medications  (ICD-V58.69) 2)  Hyperlipidemia-mixed  (ICD-272.4) 3)  Acute Bronchitis  (ICD-466.0) 4)  Diastolic Heart Failure, Chronic  (ICD-428.32) 5)  Carotid Bruit, Right  (ICD-785.9) 6)  Hypertension, Unspecified  (ICD-401.9) 7)  Incontinence, Urge  (ICD-788.31) 8)  Uti  (ICD-599.0) 9)  Essential Hypertension, Benign  (ICD-401.1) 10)  Visual Changes  (ICD-368.10) 11)  Headache  (ICD-784.0) 12)  Other Screening Mammogram  (ICD-V76.12) 13)  Special Screening For Osteoporosis  (ICD-V82.81) 14)  Ganglion Cyst, Wrist, Left  (ICD-727.41) 15)  Special Screening Malig Neoplasms Other Sites  (ICD-V76.49) 16)  Peripheral Neuropathy, Idiopathic  (ICD-356.9) 17)  Fatigue  (ICD-780.79) 18)  Dysuria, Chronic  (ICD-788.1) 19)  Vaginal Mass  (ICD-625.8) 20)  Stress Incontinence  (ICD-788.39) 21)  Venous Insufficiency  (ICD-459.81) 22)  Degeneration, Cervical Disc  (ICD-722.4) 23)  Retinal Vein Occlusion, Blind in Right Eye  (ICD-362.30) 24)  Renal Insufficiency   (ICD-588.9) 25)  Peripheral Vascular Disease  (ICD-443.9) 26)  Osteoarthritis  (ICD-715.90) 27)  Gerd  (ICD-530.81) 28)  Diverticulosis, Colon  (ICD-562.10) 29)  COPD  (ICD-496) 30)  Allergic Rhinitis  (ICD-477.9) 31)  Renal Stone  (ICD-592.9) 32)  Hypercholesterolemia, Pure  (ICD-272.0) 33)  Cardiac Murmur  (ICD-785.2) 34)  Kidney Disease, Chronic, Stage Iii  (ICD-585.3) 35)  Essential Hypertension  (ICD-401.9)  Current Medications (verified): 1)  Multivitamins  Tabs (Multiple Vitamin) .... Take 1 Tablet By Mouth Once A Day 2)  Vitamin E 1000 Unit Caps (Vitamin E) .... Take 1 Capsule By Mouth Once A Day 3)  Aspirin 81 Mg Tbec (Aspirin) .... Take 1 Tablet By Mouth Once A Day 4)  Celebrex 200 Mg Caps (Celecoxib) .... Take 1 Capsule By Mouth Once A Day 5)  Tylenol Pm Extra Strength  Tabs (Diphenhydramine-Apap (Sleep) Tabs) .... Take 1 Tab By Mouth At Bedtime 6)  Simvastatin 40 Mg  Tabs (Simvastatin) .... Take 1 Tablet By Mouth Once A Day 7)  Bystolic 10 Mg Tabs (Nebivolol Hcl) .... Take 1 Tablet By Mouth Once A Day 8)  Gabapentin 600 Mg Tabs (Gabapentin) .Marland Kitchen.. 1 Tab By Mouth At Bedtime 9)  Losartan Potassium 100 Mg Tabs (Losartan Potassium) .... Take 1/2 Tablet Daily 10)  Chlorthalidone 25 Mg Tabs (Chlorthalidone) .... Take1/2 Tablet By Mouth Daily 11)  Hydralazine Hcl 25 Mg Tabs (Hydralazine Hcl) .... Take One Tablet By Mouth Three Times A Day 12)  Enablex 15 Mg  Xr24h-Tab (Darifenacin Hydrobromide) .Marland Kitchen.. 1 Tab By Mouth Daily 13)  Hydralazine Hcl 50 Mg Tabs (Hydralazine Hcl) .... Take One Tablet By Mouth Three Times A Day  Allergies: 1)  ! Penicillin V Potassium (Penicillin V Potassium) 2)  ! Levaquin (Levofloxacin)  Past History:  Past medical, surgical, family and social histories (including risk factors) reviewed for relevance to current acute and chronic problems.  Past Medical History: Reviewed history from 03/02/2010 and no changes required. 1. Allergic rhinitis 2. COPD 3.  Diverticulosis, colon 4. GERD 5. Hyperlipidemia 6. Hypertension: Lower extremity swelling with amlodipine, intolerant of ACEIs 7. Osteoarthritis 8. CKD 9. Impaired vision right eye, was told this was due to HTN 10.  Normal cath in 1999 11.  LBBB 12.  Diastolic CHF: Echo (4/11) with EF 60-65%, mild LVH, mild aortic insufficiency, mild MR, PA systolic pressure 37-41 mmHg, mild to moderate TR.   Past Surgical History: Reviewed history from 05/05/2007 and no changes required. 2003      Hospitalized colitis, pseudomembranous - C.Diff 1970      Total hysterectomy - suspicious cells 1980      Cholecystectomy 1999      Stress test - ischemia 1999      Cath - nml. 2000      PFT's 2005      C-spine - cerv. disc  dz.  C5-C7, facet arthritis 2004      Abd. CT (-) 04/2005   ABI nml 05/2006   CXR, COPD, osteopenia 11/2006   DXA nml. T + 1.32  Family History: Reviewed history from 05/05/2007 and no changes required. Father: Died 105, dementia Mother: Died 66, complete heart block Siblings: 6 brothers, lung CA, sinus CA 2 sisters, one with Colon CA, one with dementia CV:  (+) HBP:  (+) DM:  Brother Colon CA:  Sister  Social History: Reviewed history from 01/26/2010 and no changes required. Former Smoker: > 1 ppd, quit 1986 Alcohol use-yes, light Drug use-no Lives alone Children: 3 daughters, 1 RN Occupation: Retired, Therapist, nutritional Exercise:  Golf Diet:  3 meals a day  Review of Systems General:  Complains of fatigue; denies fever and loss of appetite. CV:  Denies chest pain or discomfort and palpitations. Resp:  Denies shortness of breath, sputum productive, and wheezing. GI:  Denies abdominal pain. GU:  Denies dysuria.  Physical Exam  General:  elderly female in NAD Mouth:  MMM Neck:  No deformities, masses, or tenderness noted. Lungs:  normal respiratory effort, no intercostal retractions, and no accessory muscle use.  scattered coarse breath sounds with intermittent rare  crackles, but without focal crackles in one lobe.  Heart:  Normal rate and regular rhythm. S1 and S2 normal without gallop, murmur, click, rub or other extra sounds. Abdomen:  Bowel sounds positive,abdomen soft and non-tender without masses, organomegaly or hernias noted. No CVA tenderness Pulses:  R and L posterior tibial pulses are full and equal bilaterally  Extremities:  trace left pedal edema and trace right pedal edema.   Skin:  Intact without suspicious lesions or rashes Psych:  Cognition and judgment appear intact. Alert and cooperative with normal attention span and concentration. No apparent delusions, illusions, hallucinations   Impression & Recommendations:  Problem # 1:  HYPERLIPIDEMIA-MIXED (ICD-272.4) Will try decreasing chol med to see if left leg pain improves.  Her updated medication list for this problem includes:    Simvastatin 40 Mg Tabs (Simvastatin) .Marland Kitchen... Take 1/2 tablet by mouth once a day  Problem # 2:  HYPERTENSION, UNSPECIFIED (ICD-401.9) Well controlled. Continue current medication.  The following medications were removed from the medication list:    Hydralazine Hcl 25 Mg Tabs (Hydralazine hcl) .Marland Kitchen... Take one tablet by mouth three times a day Her updated medication list for this problem includes:    Bystolic 10 Mg Tabs (Nebivolol hcl) .Marland Kitchen... Take 1 tablet by mouth once a day    Losartan Potassium 100 Mg Tabs (Losartan potassium) .Marland Kitchen... Take 1/2 tablet daily    Chlorthalidone 25 Mg Tabs (Chlorthalidone) .Marland Kitchen... Take1/2 tablet by mouth daily    Hydralazine Hcl 50 Mg Tabs (Hydralazine hcl) .Marland Kitchen... Take one tablet by mouth three times a day  BP today: 138/60 Prior BP: 130/64 (04/03/2010)  Prior 10 Yr Risk Heart Disease: 6 % (07/14/2009)  Labs Reviewed: K+: 5.3 (04/24/2010) Creat: : 2.0 (04/24/2010)   Chol: 153 (04/24/2010)   HDL: 48.90 (04/24/2010)   LDL: 80 (04/24/2010)   TG: 119.0 (04/24/2010)  Problem # 3:  FATIGUE (ICD-780.79) Possibly due to hemoglobin drop  resulting in anemia. Will eval for blood loss in stool with hemeoccults.  Cbc, ferritin, total iron,transferrin, BMET early next week.   Problem # 4:  HYPERKALEMIA (ICD-276.7) Hold potassium rich foods and potassium containing multivitamin. Recheck with next labs.   Complete Medication List: 1)  Multivitamins Tabs (Multiple vitamin) .... Take 1 tablet by mouth once a day 2)  Vitamin E 1000 Unit Caps (Vitamin e) .... Take 1 capsule by mouth once a day 3)  Aspirin 81 Mg Tbec (Aspirin) .... Take 1 tablet by mouth once a day 4)  Celebrex 200 Mg Caps (Celecoxib) .... Take 1 capsule by mouth once a day 5)  Tylenol Pm Extra Strength Tabs (Diphenhydramine-apap (sleep) tabs) .... Take 1 tab by mouth at bedtime 6)  Simvastatin 40 Mg Tabs (Simvastatin) .... Take 1/2 tablet by mouth once a day 7)  Bystolic 10 Mg Tabs (Nebivolol hcl) .... Take 1 tablet by mouth once a day 8)  Gabapentin 600 Mg Tabs (Gabapentin) .Marland Kitchen.. 1 tab by mouth at bedtime 9)  Losartan Potassium 100 Mg Tabs (Losartan potassium) .... Take 1/2 tablet daily 10)  Chlorthalidone 25 Mg Tabs (Chlorthalidone) .... Take1/2 tablet by mouth daily 11)  Enablex 15 Mg Xr24h-tab (Darifenacin hydrobromide) .Marland Kitchen.. 1 tab by mouth daily 12)  Hydralazine Hcl 50 Mg Tabs (Hydralazine hcl) .... Take one tablet by mouth three times a day  Patient Instructions: 1)  Return stool cards ASAP. 2)  Stop multivitamin.Marland Kitchen decrease potassium rich foods. 3)  Cbc, ferritin, total iron,transferrin, BMET early next week. Dx 780.79 4)  Decrease simvastain to 20 mg daily  to see if less leg pain.  5)  Please schedule a follow-up appointment in 1 month  Current Allergies (reviewed today): ! PENICILLIN V POTASSIUM (PENICILLIN V POTASSIUM) ! LEVAQUIN (LEVOFLOXACIN)

## 2010-11-14 NOTE — Progress Notes (Signed)
Summary: call a nurse   Phone Note Call from Patient   Summary of Call: Triage Record Num: [id number] Operator: Frederico Hamman Patient Name: Elizabeth Silva Call Date & Time: 08/05/2010 4:59:10PM Patient Phone: [phone] PCP: Kerby Nora Patient Gender: Female PCP Fax : [phone] Patient DOB: [birth year] Practice Name: [facility] Reason for Call: [name] states she had onset of tight chest on 10/20. Productive cough with gray sputum. Having difficulty with daily activities. Chest 'rattling".Advised to call 911and go to [facility] for evaluation. Protocol(s) Used: Breathing Problems Recommended Outcome per Protocol: Activate EMS 911 Reason for Outcome: Worsening breathing problems AND known cardiac (CAD, angina, HF) or chronic lung disease (asthma, COPD) NOT responding to treatment OR treatment not available Care Advice:  ~ Do not give the patient anything to eat or drink.  ~ An adult should stay with the patient, preferably one trained in CPR.  ~ IMMEDIATE ACTION Write down provider's name. List or place the following in a bag for transport with the patient: current prescription and/or nonprescription medications; alternative treatments, therapies and medications; and street drugs.  ~ 10/ Initial call taken by: Melody Comas,  August 07, 2010 8:56 AM

## 2010-11-14 NOTE — Assessment & Plan Note (Signed)
Summary: CPX/DLO   Vital Signs:  Patient profile:   77 year old female Height:      62.25 inches Weight:      137.0 pounds BMI:     24.95 Temp:     98.0 degrees F oral Pulse rate:   72 / minute Pulse rhythm:   regular BP sitting:   130 / 60  (left arm) Cuff size:   regular  Vitals Entered By: Benny Lennert CMA Duncan Dull) (August 25, 2010 2:00 PM)  History of Present Illness: Chief complaint chronic illness management  10/23 admitted for SOB, chest pressure, mild cough. Dx with COPD, exacerbation..started on IV rochephin and zithromax.Marland KitchenMarland KitchenD/C'd on ceftin (has 3 more days) and spiriva.  [place] ing albuterol as needed...has not needed this since discharge.  CXR and EKG nml.   Found to have Hyperkalemia K 6.2....given kayexylate, IV fluids.  Not on any potassium supplement..was  on losartan and chlorthalidone...stopped in hosp. BP running well at home since off these meds.   Also found to have UTI.Marland Kitchenasymptomatic...started on Zosyn Referred to [facility]..supposed to call for an appointment Cr 1.9 in hopsital..baseline around 2.  Continuing to do well since then.  Potassium and kidney function stable at  last 2 checks in last 2 weeks. Breathing doing well...cough resolved. No fever. She does have congestion in throat, post nasal drip when waking up in throat...improved thorugh the day. Ongoing since MAy, gradually improving. Occ sneeze, no itchy eyes. No rhinnorhea.  High cholesterol..numbers high but still at goal off..simvastain. Now myalgia resolved.  Wsihes to change several meds to things on her formulary. Celebrex..try meloxicam.  Hypertension History:      She denies headache, chest pain, palpitations, orthopnea, peripheral edema, neurologic problems, syncope, and side effects from treatment.  122-151/51-61  on bystolic and amlodipine. Marland Kitchen        Positive major cardiovascular risk factors include female age 54 years old or older, hyperlipidemia, and hypertension.   Negative major cardiovascular risk factors include non-tobacco-user status.        Positive history for target organ damage include cardiac end organ damage (either CHF or LVH) and peripheral vascular disease.     Problems Prior to Update: 1)  Hyperkalemia  (ICD-276.7) 2)  Encounter For Long-term Use of Other Medications  (ICD-V58.69) 3)  Diastolic Heart Failure, Chronic  (ICD-428.32) 4)  Carotid Bruit, Right  (ICD-785.9) 5)  Incontinence, Urge  (ICD-788.31) 6)  Other Screening Mammogram  (ICD-V76.12) 7)  Special Screening For Osteoporosis  (ICD-V82.81) 8)  Special Screening Malig Neoplasms Other Sites  (ICD-V76.49) 9)  Peripheral Neuropathy, Idiopathic  (ICD-356.9) 10)  Dysuria, Chronic  (ICD-788.1) 11)  Stress Incontinence  (ICD-788.39) 12)  Venous Insufficiency  (ICD-459.81) 13)  Degeneration, Cervical Disc  (ICD-722.4) 14)  Retinal Vein Occlusion, Blind in Right Eye  (ICD-362.30) 15)  Peripheral Vascular Disease  (ICD-443.9) 16)  Osteoarthritis  (ICD-715.90) 17)  Gerd  (ICD-530.81) 18)  Diverticulosis, Colon  (ICD-562.10) 19)  COPD  (ICD-496) 20)  Allergic Rhinitis  (ICD-477.9) 21)  Renal Stone  (ICD-592.9) 22)  Hypercholesterolemia, Pure  (ICD-272.0) 23)  Cardiac Murmur  (ICD-785.2) 24)  Kidney Disease, Chronic, Stage Iii  (ICD-585.3) 25)  Essential Hypertension  (ICD-401.9)  Current Medications (verified): 1)  Vitamin E 1000 Unit Caps (Vitamin E) .... Take 1 Capsule By Mouth Once A Day 2)  Aspirin 81 Mg Tbec (Aspirin) .... Take 1 Tablet By Mouth Once A Day 3)  Meloxicam 15 Mg Tabs (Meloxicam) .... Take 1 Tablet By Mouth  Once A Day 4)  Tylenol Pm Extra Strength  Tabs (Diphenhydramine-Apap (Sleep) Tabs) .... Take 1 Tab By Mouth At Bedtime 5)  Bystolic 10 Mg Tabs (Nebivolol Hcl) .... Take 1 Tablet By Mouth Once A Day 6)  Gabapentin 600 Mg Tabs (Gabapentin) .Marland Kitchen.. 1 Tab By Mouth At Bedtime 7)  Enablex 15 Mg Xr24h-Tab (Darifenacin Hydrobromide) .Marland Kitchen.. 1 Tab By Mouth Daily 8)   Hydralazine Hcl 50 Mg Tabs (Hydralazine Hcl) .... Take One Tablet By Mouth Three Times A Day 9)  Proventil Hfa 108 (90 Base) Mcg/act Aers (Albuterol Sulfate) .... 2 Puffs Every 4 Hours 10)  Ceftin 250 Mg Tabs (Cefuroxime Axetil) .... One Tablet 2 Times Daily 11)  Mucinex 600 Mg Xr12h-Tab (Guaifenesin) .... One Tablet 2 Times Daily 12)  Norvasc 5 Mg Tabs (Amlodipine Besylate) .... One Tablet Daily 13)  Spiriva Handihaler 18 Mcg Caps (Tiotropium Bromide Monohydrate) .Marland Kitchen.. 1 Inhalation Daily  Allergies: 1)  ! Penicillin V Potassium (Penicillin V Potassium) 2)  ! Levaquin (Levofloxacin)  Past History:  Past medical, surgical, family and social histories (including risk factors) reviewed, and no changes noted (except as noted below).  Past Medical History: Reviewed history from 03/02/2010 and no changes required. 1. Allergic rhinitis 2. COPD 3. Diverticulosis, colon 4. GERD 5. Hyperlipidemia 6. Hypertension: Lower extremity swelling with amlodipine, intolerant of ACEIs 7. Osteoarthritis 8. CKD 9. Impaired vision right eye, was told this was due to HTN 10.  Normal cath in 1999 11.  LBBB 12.  Diastolic CHF: Echo (4/11) with EF 60-65%, mild LVH, mild aortic insufficiency, mild MR, PA systolic pressure 37-41 mmHg, mild to moderate TR.   Past Surgical History: Reviewed history from 05/05/2007 and no changes required. 2003      Hospitalized colitis, pseudomembranous - C.Diff 1970      Total hysterectomy - suspicious cells 1980      Cholecystectomy 1999      Stress test - ischemia 1999      Cath - nml. 2000      PFT's 2005      C-spine - cerv. disc  dz.  C5-C7, facet arthritis 2004      Abd. CT (-) 04/2005   ABI nml 05/2006   CXR, COPD, osteopenia 11/2006   DXA nml. T + 1.32  Family History: Reviewed history from 05/05/2007 and no changes required. Father: Died 65, dementia Mother: Died 13, complete heart block Siblings: 6 brothers, lung CA, sinus CA 2 sisters, one with Colon CA, one  with dementia CV:  (+) HBP:  (+) DM:  Brother Colon CA:  Sister  Social History: Reviewed history from 01/26/2010 and no changes required. Former Smoker: > 1 ppd, quit 1986 Alcohol use-yes, light Drug use-no Lives alone Children: 3 daughters, 1 RN Occupation: Retired, Therapist, nutritional Exercise:  Golf Diet:  3 meals a day  Review of Systems General:  Denies fatigue and fever. ENT:  Cogestion in throat. CV:  Denies chest pain or discomfort. Resp:  Denies shortness of breath. GI:  Denies abdominal pain. GU:  Denies dysuria.  Physical Exam  General:  elderly female in NAD Head:  no maxillary sinus ttp Eyes:  No corneal or conjunctival inflammation noted. EOMI. Perrla. Funduscopic exam benign, without clearhemorrhages, exudates or papilledema. Vision grossly normal in left, absent in right due to past retinal vein occlusion.  Ears:  External ear exam shows no significant lesions or deformities.  Otoscopic examination reveals clear canals, tympanic membranes are intact bilaterally without bulging, retraction, inflammation or discharge.  Hearing is grossly normal bilaterally. Nose:  nasal dischargemucosal pallor.   Mouth:  Mucus in thraot at base of oropharynx Neck:  no carotid bruit or thyromegaly no cervical or supraclavicular lymphadenopathy  Breasts:  refused  Lungs:  Normal respiratory effort, chest expands symmetrically. Lungs are clear to auscultation, no crackles or wheezes. Heart:  Normal rate and regular rhythm. S1 and S2 normal without gallop, murmur, click, rub or other extra sounds. Abdomen:  Bowel sounds positive,abdomen soft and non-tender without masses, organomegaly or hernias noted. Genitalia:  not indicated Pulses:  R and L posterior tibial pulses are full and equal bilaterally  Extremities:  no edmea Skin:  Intact without suspicious lesions or rashes Psych:  Cognition and judgment appear intact. Alert and cooperative with normal attention span and concentration.  No apparent delusions, illusions, hallucinations   Impression & Recommendations:  Problem # 1:  HYPERKALEMIA (ICD-276.7) Resolved.   Problem # 2:  COPD (ICD-496) Exacerbation resolved... Off antibiotics.  Her updated medication list for this problem includes:    Proventil Hfa 108 (90 Base) Mcg/act Aers (Albuterol sulfate) .Marland Kitchen... 2 puffs every 4 hours    Spiriva Handihaler 18 Mcg Caps (Tiotropium bromide monohydrate) .Marland Kitchen... 1 inhalation daily  Problem # 3:  ALLERGIC RHINITIS (ICD-477.9) Trial of zyrtec, nasal saline. Continue mucolytic. Call if not imrpoving.   Problem # 4:  ESSENTIAL HYPERTENSION (ICD-401.9)  Well controlled on current meds.  Her updated medication list for this problem includes:    Bystolic 10 Mg Tabs (Nebivolol hcl) .Marland Kitchen... Take 1 tablet by mouth once a day    Hydralazine Hcl 50 Mg Tabs (Hydralazine hcl) .Marland Kitchen... Take one tablet by mouth three times a day    Norvasc 5 Mg Tabs (Amlodipine besylate) ..... One tablet daily  BP today: 130/60 Prior BP: 110/64 (08/11/2010)  10 Yr Risk Heart Disease: 9 % Prior 10 Yr Risk Heart Disease: 6 % (07/14/2009)  Labs Reviewed: K+: 4.8 (08/17/2010) Creat: : 1.7 (08/17/2010)   Chol: 211 (08/17/2010)   HDL: 41.00 (08/17/2010)   LDL: 80 (04/24/2010)   TG: 161.0 (08/17/2010)  Problem # 5:  HYPERCHOLESTEROLEMIA, PURE (ICD-272.0) Remains fairly well controlled off simvastatin.   Problem # 6:  OTHER SCREENING MAMMOGRAM (ICD-V76.12) Discussed recommendations to stop screening at age 40..given pt fairly healthy..she wishes to contue screening. She would do lumpectomy or chemo if need be.  Orders: Radiology Referral (Radiology)  Complete Medication List: 1)  Vitamin E 1000 Unit Caps (Vitamin e) .... Take 1 capsule by mouth once a day 2)  Aspirin 81 Mg Tbec (Aspirin) .... Take 1 tablet by mouth once a day 3)  Meloxicam 15 Mg Tabs (Meloxicam) .... Take 1 tablet by mouth once a day 4)  Tylenol Pm Extra Strength Tabs (Diphenhydramine-apap  (sleep) tabs) .... Take 1 tab by mouth at bedtime 5)  Bystolic 10 Mg Tabs (Nebivolol hcl) .... Take 1 tablet by mouth once a day 6)  Gabapentin 600 Mg Tabs (Gabapentin) .Marland Kitchen.. 1 tab by mouth at bedtime 7)  Enablex 15 Mg Xr24h-tab (Darifenacin hydrobromide) .Marland Kitchen.. 1 tab by mouth daily 8)  Hydralazine Hcl 50 Mg Tabs (Hydralazine hcl) .... Take one tablet by mouth three times a day 9)  Proventil Hfa 108 (90 Base) Mcg/act Aers (Albuterol sulfate) .... 2 puffs every 4 hours 10)  Ceftin 250 Mg Tabs (Cefuroxime axetil) .... One tablet 2 times daily 11)  Mucinex 600 Mg Xr12h-tab (Guaifenesin) .... One tablet 2 times daily 12)  Norvasc 5 Mg Tabs (Amlodipine besylate) .Marland KitchenMarland KitchenMarland Kitchen  One tablet daily 13)  Spiriva Handihaler 18 Mcg Caps (Tiotropium bromide monohydrate) .Marland Kitchen.. 1 inhalation daily  Hypertension Assessment/Plan:      The patient's hypertensive risk group is category C: Target organ damage and/or diabetes.  Her calculated 10 year risk of coronary heart disease is 9 %.  Today's blood pressure is 130/60.  Her blood pressure goal is < 140/90.  Patient Instructions: 1)  Stop celebrex..replace with meloxicam but use only if really needed. 2)  Referral Appointment Information 3)  Day/Date: 4)  Time: 5)  Place/MD: 6)  Address: 7)  Phone/Fax: 8)  Patient given appointment information. Information/Orders faxed/mailed.  9)   Start zyrtec at bedtime.Marland KitchenMarland KitchenNO DECONGESTANT. 10)   Continue muciinex. 11)   Start nasal saline spray 3 times a day. 12)   Call if congestion in throat not improving. 13)  Please schedule a follow-up appointment in 3 months .  Prescriptions: MELOXICAM 15 MG TABS (MELOXICAM) Take 1 tablet by mouth once a day  #30 x 11   Entered and Authorized by:   Kerby Nora MD   Signed by:   Kerby Nora MD on 08/25/2010   Method used:   Electronically to        [facility]* (retail)       [address]       [place], [place]  [address]       Ph: [phone]       Fax:  [phone]   RxID:   [id number]    Orders Added: 1)  Radiology Referral [Radiology] 2)  Est. Patient Level IV [84132]    Current Allergies (reviewed today): ! PENICILLIN V POTASSIUM (PENICILLIN V POTASSIUM) ! LEVAQUIN (LEVOFLOXACIN)  Last Mammogram:  normal (08/04/2009 2:04:34 PM) Mammogram Next Due:  1 yr

## 2010-11-14 NOTE — Progress Notes (Signed)
Summary: PHI  PHI   Imported By: Harlon Flor 01/27/2010 10:23:27  _____________________________________________________________________  External Attachment:    Type:   Image     Comment:   External Document

## 2010-11-14 NOTE — Assessment & Plan Note (Signed)
Summary: ? SPITTING UP BLOOD, CONGESTION   Vital Signs:  Patient profile:   77 year old female Height:      62.25 inches Weight:      143.50 pounds BMI:     26.13 Temp:     98.3 degrees F oral Pulse rate:   60 / minute Pulse rhythm:   regular BP sitting:   140 / 62  (left arm) Cuff size:   regular  Vitals Entered By: Linde Gillis CMA Duncan Dull) (May 18, 2010 11:39 AM) CC: congestion, spitting up blood   History of Present Illness: Chief complaint - follow up Headache congestion and fever.  Seen by Dr. Patsy Lager on 6/76.   77 year old female with a history of COPD, long former smoking history, here for follow up bronchitis/COPD exacerbation.  Saw Dr. Patsy Lager on 6/6, treated with Zpack and short course prednisone.  All symptoms have resolved- SOB, wheezing and fever but coughed up some blood tinged mucous last night.  No recurrent blood tinged mucous.    Current Medications (verified): 1)  Vitamin E 1000 Unit Caps (Vitamin E) .... Take 1 Capsule By Mouth Once A Day 2)  Aspirin 81 Mg Tbec (Aspirin) .... Take 1 Tablet By Mouth Once A Day 3)  Celebrex 200 Mg Caps (Celecoxib) .... Take 1 Capsule By Mouth Once A Day 4)  Tylenol Pm Extra Strength  Tabs (Diphenhydramine-Apap (Sleep) Tabs) .... Take 1 Tab By Mouth At Bedtime 5)  Simvastatin 40 Mg  Tabs (Simvastatin) .... Take 1/2 Tablet By Mouth Once A Day 6)  Bystolic 10 Mg Tabs (Nebivolol Hcl) .... Take 1 Tablet By Mouth Once A Day 7)  Gabapentin 600 Mg Tabs (Gabapentin) .Marland Kitchen.. 1 Tab By Mouth At Bedtime 8)  Losartan Potassium 100 Mg Tabs (Losartan Potassium) .... Take 1/2 Tablet Daily 9)  Chlorthalidone 25 Mg Tabs (Chlorthalidone) .... Take1/2 Tablet By Mouth Daily 10)  Enablex 15 Mg Xr24h-Tab (Darifenacin Hydrobromide) .Marland Kitchen.. 1 Tab By Mouth Daily 11)  Hydralazine Hcl 50 Mg Tabs (Hydralazine Hcl) .... Take One Tablet By Mouth Three Times A Day  Allergies: 1)  ! Penicillin V Potassium (Penicillin V Potassium) 2)  ! Levaquin  (Levofloxacin)  Past History:  Past Medical History: Last updated: 03/02/2010 1. Allergic rhinitis 2. COPD 3. Diverticulosis, colon 4. GERD 5. Hyperlipidemia 6. Hypertension: Lower extremity swelling with amlodipine, intolerant of ACEIs 7. Osteoarthritis 8. CKD 9. Impaired vision right eye, was told this was due to HTN 10.  Normal cath in 1999 11.  LBBB 12.  Diastolic CHF: Echo (4/11) with EF 60-65%, mild LVH, mild aortic insufficiency, mild MR, PA systolic pressure 37-41 mmHg, mild to moderate TR.   Past Surgical History: Last updated: 05/09/2007 2003      Hospitalized colitis, pseudomembranous - C.Diff 1970      Total hysterectomy - suspicious cells 1980      Cholecystectomy 1999      Stress test - ischemia 1999      Cath - nml. 2000      PFT's 2005      C-spine - cerv. disc  dz.  C5-C7, facet arthritis 2004      Abd. CT (-) 04/2005   ABI nml 05/2006   CXR, COPD, osteopenia 11/2006   DXA nml. T + 1.32  Family History: Last updated: 2007/05/09 Father: Died 69, dementia Mother: Died 55, complete heart block Siblings: 6 brothers, lung CA, sinus CA 2 sisters, one with Colon CA, one with dementia CV:  (+)  HBP:  (+) DM:  Brother Colon CA:  Sister  Social History: Last updated: 01/26/2010 Former Smoker: > 1 ppd, quit 1986 Alcohol use-yes, light Drug use-no Lives alone Children: 3 daughters, 1 RN Occupation: Retired, Therapist, nutritional Exercise:  Golf Diet:  3 meals a day  Risk Factors: Smoking Status: quit (05/05/2007)  Review of Systems      See HPI General:  Denies fever. CV:  Denies chest pain or discomfort. Resp:  Complains of coughing up blood and sputum productive; denies shortness of breath and wheezing.  Physical Exam  General:  elderly female in NAD Ears:  External ear exam shows no significant lesions or deformities.  Otoscopic examination reveals clear canals, tympanic membranes are intact bilaterally without bulging, retraction, inflammation or  discharge. Hearing is grossly normal bilaterally. Nose:  External nasal examination shows no deformity or inflammation. Nasal mucosa are pink and moist without lesions or exudates. Mouth:  MMM Lungs:  normal respiratory effort, no intercostal retractions, and no accessory muscle use. No wheezes or crackles. Heart:  Normal rate and regular rhythm. S1 and S2 normal without gallop, murmur, click, rub or other extra sounds. Extremities:  trace left pedal edema and trace right pedal edema.   Psych:  Cognition and judgment appear intact. Alert and cooperative with normal attention span and concentration. No apparent delusions, illusions, hallucinations   Impression & Recommendations:  Problem # 1:  COPD (ICD-496) Assessment Improved Reassurance provided. Pt has appt scheduled to follow up with Dr. Ermalene Searing for other routine issues next week.  Complete Medication List: 1)  Vitamin E 1000 Unit Caps (Vitamin e) .... Take 1 capsule by mouth once a day 2)  Aspirin 81 Mg Tbec (Aspirin) .... Take 1 tablet by mouth once a day 3)  Celebrex 200 Mg Caps (Celecoxib) .... Take 1 capsule by mouth once a day 4)  Tylenol Pm Extra Strength Tabs (Diphenhydramine-apap (sleep) tabs) .... Take 1 tab by mouth at bedtime 5)  Simvastatin 40 Mg Tabs (Simvastatin) .... Take 1/2 tablet by mouth once a day 6)  Bystolic 10 Mg Tabs (Nebivolol hcl) .... Take 1 tablet by mouth once a day 7)  Gabapentin 600 Mg Tabs (Gabapentin) .Marland Kitchen.. 1 tab by mouth at bedtime 8)  Losartan Potassium 100 Mg Tabs (Losartan potassium) .... Take 1/2 tablet daily 9)  Chlorthalidone 25 Mg Tabs (Chlorthalidone) .... Take1/2 tablet by mouth daily 10)  Enablex 15 Mg Xr24h-tab (Darifenacin hydrobromide) .Marland Kitchen.. 1 tab by mouth daily 11)  Hydralazine Hcl 50 Mg Tabs (Hydralazine hcl) .... Take one tablet by mouth three times a day  Current Allergies (reviewed today): ! PENICILLIN V POTASSIUM (PENICILLIN V POTASSIUM) ! LEVAQUIN (LEVOFLOXACIN)

## 2010-11-14 NOTE — Progress Notes (Signed)
Summary: detrol is too costly  Phone Note Call from Patient Call back at [facility] Phone [phone]   Caller: Patient Call For: Kerby Nora MD Summary of Call: Pt says the detrol is too expensive, she is asking if something else can be [facility] to [facility].  Please let pt know.   Lowella Petties CMA  Feb 28, 2010 11:41 AM  Initial call taken by: Lowella Petties CMA,  Feb 28, 2010 11:44 AM  Follow-up for Phone Call        Have her call her insurance to find out what is preferred.  Follow-up by: Kerby Nora MD,  Feb 28, 2010 11:50 AM  Additional Follow-up for Phone Call Additional follow up Details #1::        patient will call insurance company and find out what they will pay for Additional Follow-up by: Benny Lennert CMA Duncan Dull),  Feb 28, 2010 11:54 AM

## 2010-11-14 NOTE — Assessment & Plan Note (Signed)
Summary: 1 MONTH FOLLOW UP/[facility]   Vital Signs:  Patient profile:   77 year old female Weight:      146.13 pounds Temp:     98.2 degrees F oral Pulse rate:   64 / minute Pulse rhythm:   regular BP sitting:   122 / 48  (left arm) Cuff size:   regular  Vitals Entered By: Linde Gillis CMA Duncan Dull) (November 11, 2009 10:54 AM) CC: 1 month follow up   History of Present Illness: HTN, improved control on higher dose of losartan...BPs 115-161/54-68 Headaches improved. No SE.      Problems Prior to Update: 1)  Visual Changes  (ICD-368.10) 2)  Headache  (ICD-784.0) 3)  Other Screening Mammogram  (ICD-V76.12) 4)  Special Screening For Osteoporosis  (ICD-V82.81) 5)  Ganglion Cyst, Wrist, Left  (ICD-727.41) 6)  Special Screening Malig Neoplasms Other Sites  (ICD-V76.49) 7)  Peripheral Neuropathy, Idiopathic  (ICD-356.9) 8)  Fatigue  (ICD-780.79) 9)  Dysuria, Chronic  (ICD-788.1) 10)  Vaginal Mass  (ICD-625.8) 11)  Stress Incontinence  (ICD-788.39) 12)  Venous Insufficiency  (ICD-459.81) 13)  Degeneration, Cervical Disc  (ICD-722.4) 14)  Retinal Vein Occlusion, Blind in Right Eye  (ICD-362.30) 15)  Renal Insufficiency  (ICD-588.9) 16)  Peripheral Vascular Disease  (ICD-443.9) 17)  Osteoarthritis  (ICD-715.90) 18)  Gerd  (ICD-530.81) 19)  Diverticulosis, Colon  (ICD-562.10) 20)  COPD  (ICD-496) 21)  Allergic Rhinitis  (ICD-477.9) 22)  Renal Stone  (ICD-592.9) 23)  Hypercholesterolemia, Pure  (ICD-272.0) 24)  Cardiac Murmur  (ICD-785.2) 25)  Kidney Disease, Chronic, Stage Iii  (ICD-585.3) 26)  Essential Hypertension  (ICD-401.9)  Current Medications (verified): 1)  Multivitamins  Tabs (Multiple Vitamin) .... Take 1 Tablet By Mouth Once A Day 2)  Vitamin E 1000 Unit Caps (Vitamin E) .... Take 1 Capsule By Mouth Once A Day 3)  Aspirin 81 Mg Tbec (Aspirin) .... Take 1 Tablet By Mouth Once A Day 4)  Celebrex 200 Mg Caps (Celecoxib) .... Take 1 Capsule By Mouth Once A Day 5)  Tylenol  Pm Extra Strength  Tabs (Diphenhydramine-Apap (Sleep) Tabs) .... Take 1 Tab By Mouth At Bedtime 6)  Furosemide 20 Mg Tabs (Furosemide) .... Take 1 Tablet By Mouth Once A Day As Needed Swelling 7)  Simvastatin 40 Mg  Tabs (Simvastatin) .... Take 1 Tablet By Mouth Once A Day 8)  Metoprolol Tartrate 25 Mg  Tabs (Metoprolol Tartrate) .... Take 1 Tablet By Mouth Every 12 Hours 9)  Gabapentin 600 Mg Tabs (Gabapentin) .Marland Kitchen.. 1 Tab By Mouth At Bedtime 10)  Promethegan 25 Mg Supp (Promethazine Hcl) .Marland Kitchen.. 1 Per Rectum Q 6 Hours As Needed For Nausea 11)  Losartan Potassium 100 Mg Tabs (Losartan Potassium) .Marland Kitchen.. 1 By Mouth Daily 12)  Amlodipine Besylate 2.5 Mg Tabs (Amlodipine Besylate) .Marland Kitchen.. 1 Tab By Mouth Daily  Allergies: 1)  ! Penicillin V Potassium (Penicillin V Potassium) 2)  ! Levaquin (Levofloxacin)  Past History:  Past medical, surgical, family and social histories (including risk factors) reviewed, and no changes noted (except as noted below).  Past Medical History: Reviewed history from 05/05/2007 and no changes required. Allergic rhinitis COPD Diverticulosis, colon GERD Hyperlipidemia Hypertension Osteoarthritis Peripheral vascular disease Renal insufficiency  Past Surgical History: Reviewed history from 05/05/2007 and no changes required. 2003      Hospitalized colitis, pseudomembranous - C.Diff 1970      Total hysterectomy - suspicious cells 1980      Cholecystectomy 1999      Stress test -  ischemia 1999      Cath - nml. 2000      PFT's 2005      C-spine - cerv. disc  dz.  C5-C7, facet arthritis 2004      Abd. CT (-) 04/2005   ABI nml 05/2006   CXR, COPD, osteopenia 11/2006   DXA nml. T + 1.32  Family History: Reviewed history from 05/05/2007 and no changes required. Father: Died 21, dementia Mother: Died 39, complete heart block Siblings: 6 brothers, lung CA, sinus CA 2 sisters, one with Colon CA, one with dementia CV:  (+) HBP:  (+) DM:  Brother Colon CA:   Sister  Social History: Reviewed history from 05/05/2007 and no changes required. Former Smoker Alcohol use-yes, light Drug use-no Marital Status: Married, 64 years Children: 3 daughters, 1 Charity fundraiser Occupation: Retired, Therapist, nutritional Exercise:  Golf Diet:  3 meals a day  Review of Systems General:  Denies fatigue. CV:  Denies chest pain or discomfort and swelling of feet. Resp:  Denies shortness of breath.  Physical Exam  General:  elderly female in NAD Mouth:  MMM Neck:  no carotid bruit or thyromegaly no cervical or supraclavicular lymphadenopathy  Lungs:  Normal respiratory effort, chest expands symmetrically. Lungs are clear to auscultation, no crackles or wheezes. Heart:  Normal rate and regular rhythm. S1 and S2 normal without gallop, murmur, click, rub or other extra sounds. Pulses:  R and L posterior tibial pulses are full and equal bilaterally  Extremities:  1+ left pedal edema and 1+ right pedal edema.     Impression & Recommendations:  Problem # 1:  ESSENTIAL HYPERTENSION (ICD-401.9) Improving control...but pt wishes to have tighter control given risk to vision. WIll add back amlodipine low daose..this was stopped as ? causing edema at 10 mg..but never improved..more likely peripheral edema due to venous insufficency.  Her updated medication list for this problem includes:    Furosemide 20 Mg Tabs (Furosemide) .Marland Kitchen... Take 1 tablet by mouth once a day as needed swelling    Metoprolol Tartrate 25 Mg Tabs (Metoprolol tartrate) .Marland Kitchen... Take 1 tablet by mouth every 12 hours    Losartan Potassium 100 Mg Tabs (Losartan potassium) .Marland Kitchen... 1 by mouth daily    Amlodipine Besylate 2.5 Mg Tabs (Amlodipine besylate) .Marland Kitchen... 1 tab by mouth daily  Complete Medication List: 1)  Multivitamins Tabs (Multiple vitamin) .... Take 1 tablet by mouth once a day 2)  Vitamin E 1000 Unit Caps (Vitamin e) .... Take 1 capsule by mouth once a day 3)  Aspirin 81 Mg Tbec (Aspirin) .... Take 1 tablet by  mouth once a day 4)  Celebrex 200 Mg Caps (Celecoxib) .... Take 1 capsule by mouth once a day 5)  Tylenol Pm Extra Strength Tabs (Diphenhydramine-apap (sleep) tabs) .... Take 1 tab by mouth at bedtime 6)  Furosemide 20 Mg Tabs (Furosemide) .... Take 1 tablet by mouth once a day as needed swelling 7)  Simvastatin 40 Mg Tabs (Simvastatin) .... Take 1 tablet by mouth once a day 8)  Metoprolol Tartrate 25 Mg Tabs (Metoprolol tartrate) .... Take 1 tablet by mouth every 12 hours 9)  Gabapentin 600 Mg Tabs (Gabapentin) .Marland Kitchen.. 1 tab by mouth at bedtime 10)  Promethegan 25 Mg Supp (Promethazine hcl) .Marland Kitchen.. 1 per rectum q 6 hours as needed for nausea 11)  Losartan Potassium 100 Mg Tabs (Losartan potassium) .Marland Kitchen.. 1 by mouth daily 12)  Amlodipine Besylate 2.5 Mg Tabs (Amlodipine besylate) .Marland Kitchen.. 1 tab by mouth daily  Patient Instructions: 1)  Start amlodinpine low daose. Follow BP at home..call if still abpve 140/90. 2)  Please schedule a follow-up appointment in 1 month. Prescriptions: LOSARTAN POTASSIUM 100 MG TABS (LOSARTAN POTASSIUM) 1 by mouth daily  #90 x 3   Entered and Authorized by:   Kerby Nora MD   Signed by:   Kerby Nora MD on 11/11/2009   Method used:   Electronically to        [facility]* (mail-order)       [address]       [place], [place]  [address]       Ph: [phone]       Fax: [phone]   RxID:   [id number] METOPROLOL TARTRATE 25 MG  TABS (METOPROLOL TARTRATE) Take 1 tablet by mouth every 12 hours  #180 x 3   Entered and Authorized by:   Kerby Nora MD   Signed by:   Kerby Nora MD on 11/11/2009   Method used:   Electronically to        [facility]* (mail-order)       [address]       [place], [place]  [address]       Ph: [phone]       Fax: [phone]   RxID:   [id number] AMLODIPINE BESYLATE 2.5 MG TABS (AMLODIPINE BESYLATE) 1 tab by mouth daily  #30 x 11   Entered and Authorized by:   Kerby Nora MD   Signed by:    Kerby Nora MD on 11/11/2009   Method used:   Electronically to        [facility]* (retail)       [address]       [place], [place]  [address]       Ph: [phone]       Fax: [phone]   RxID:   [id number]   Current Allergies (reviewed today): ! PENICILLIN V POTASSIUM (PENICILLIN V POTASSIUM) ! LEVAQUIN (LEVOFLOXACIN)

## 2010-11-14 NOTE — Medication Information (Signed)
Summary: Clarification/[facility]  Clarification/[facility]   Imported By: Lester Turner 09/06/2010 13:55:54  _____________________________________________________________________  External Attachment:    Type:   Image     Comment:   External Document

## 2010-11-14 NOTE — Assessment & Plan Note (Signed)
Summary: 10 day f/u/dlo   Vital Signs:  Patient profile:   77 year old female Height:      62.25 inches Weight:      138.2 pounds BMI:     25.17 Temp:     98.6 degrees F oral Pulse rate:   64 / minute Pulse rhythm:   regular BP sitting:   140 / 64  (left arm) Cuff size:   regular  Vitals Entered By: Benny Lennert CMA Duncan Dull) (June 13, 2010 2:25 PM)  History of Present Illness: Chief complaint 10 day follow up  COPD, mild.Marland Kitchenspiriva helped minimally. Seen 10 days ago with fever, congestion, baseline SOB and no cough...treated for 10 days with doxycycline. CXR showed no acute process per radiologist reading.  U Culture showed contamination with multiple bacteria, none predominant.  Since last seen she feels better..no fever, resolved congestion.  Doxy.Marland Kitchenupset stomach, she has lost some weight..6 lbs.   Leg pain, improved some...of cholesterol med.   Problems Prior to Update: 1)  Leg Pain, Bilateral  (ICD-729.5) 2)  Fever Unspecified  (ICD-780.60) 3)  Shortness of Breath  (ICD-786.05) 4)  Hyperkalemia  (ICD-276.7) 5)  Fatigue  (ICD-780.79) 6)  Encounter For Long-term Use of Other Medications  (ICD-V58.69) 7)  Acute Bronchitis  (ICD-466.0) 8)  Diastolic Heart Failure, Chronic  (ICD-428.32) 9)  Carotid Bruit, Right  (ICD-785.9) 10)  Incontinence, Urge  (ICD-788.31) 11)  Visual Changes  (ICD-368.10) 12)  Other Screening Mammogram  (ICD-V76.12) 13)  Special Screening For Osteoporosis  (ICD-V82.81) 14)  Ganglion Cyst, Wrist, Left  (ICD-727.41) 15)  Special Screening Malig Neoplasms Other Sites  (ICD-V76.49) 16)  Peripheral Neuropathy, Idiopathic  (ICD-356.9) 17)  Dysuria, Chronic  (ICD-788.1) 18)  Stress Incontinence  (ICD-788.39) 19)  Venous Insufficiency  (ICD-459.81) 20)  Degeneration, Cervical Disc  (ICD-722.4) 21)  Retinal Vein Occlusion, Blind in Right Eye  (ICD-362.30) 22)  Renal Insufficiency  (ICD-588.9) 23)  Peripheral Vascular Disease  (ICD-443.9) 24)   Osteoarthritis  (ICD-715.90) 25)  Gerd  (ICD-530.81) 26)  Diverticulosis, Colon  (ICD-562.10) 27)  COPD  (ICD-496) 28)  Allergic Rhinitis  (ICD-477.9) 29)  Renal Stone  (ICD-592.9) 30)  Hypercholesterolemia, Pure  (ICD-272.0) 31)  Cardiac Murmur  (ICD-785.2) 32)  Kidney Disease, Chronic, Stage Iii  (ICD-585.3) 33)  Essential Hypertension  (ICD-401.9)  Current Medications (verified): 1)  Vitamin E 1000 Unit Caps (Vitamin E) .... Take 1 Capsule By Mouth Once A Day 2)  Aspirin 81 Mg Tbec (Aspirin) .... Take 1 Tablet By Mouth Once A Day 3)  Celebrex 200 Mg Caps (Celecoxib) .... Take 1 Capsule By Mouth Once A Day 4)  Tylenol Pm Extra Strength  Tabs (Diphenhydramine-Apap (Sleep) Tabs) .... Take 1 Tab By Mouth At Bedtime 5)  Bystolic 10 Mg Tabs (Nebivolol Hcl) .... Take 1 Tablet By Mouth Once A Day 6)  Gabapentin 600 Mg Tabs (Gabapentin) .Marland Kitchen.. 1 Tab By Mouth At Bedtime 7)  Losartan Potassium 100 Mg Tabs (Losartan Potassium) .... Take 1/2 Tablet Daily 8)  Chlorthalidone 25 Mg Tabs (Chlorthalidone) .... Take1/2 Tablet By Mouth Daily 9)  Enablex 15 Mg Xr24h-Tab (Darifenacin Hydrobromide) .Marland Kitchen.. 1 Tab By Mouth Daily 10)  Hydralazine Hcl 50 Mg Tabs (Hydralazine Hcl) .... Take One Tablet By Mouth Three Times A Day  Allergies: 1)  ! Penicillin V Potassium (Penicillin V Potassium) 2)  ! Levaquin (Levofloxacin)  Past History:  Past medical, surgical, family and social histories (including risk factors) reviewed, and no changes noted (except as noted below).  Past Medical History: Reviewed history from 03/02/2010 and no changes required. 1. Allergic rhinitis 2. COPD 3. Diverticulosis, colon 4. GERD 5. Hyperlipidemia 6. Hypertension: Lower extremity swelling with amlodipine, intolerant of ACEIs 7. Osteoarthritis 8. CKD 9. Impaired vision right eye, was told this was due to HTN 10.  Normal cath in 1999 11.  LBBB 12.  Diastolic CHF: Echo (4/11) with EF 60-65%, mild LVH, mild aortic  insufficiency, mild MR, PA systolic pressure 37-41 mmHg, mild to moderate TR.   Past Surgical History: Reviewed history from 05/05/2007 and no changes required. 2003      Hospitalized colitis, pseudomembranous - C.Diff 1970      Total hysterectomy - suspicious cells 1980      Cholecystectomy 1999      Stress test - ischemia 1999      Cath - nml. 2000      PFT's 2005      C-spine - cerv. disc  dz.  C5-C7, facet arthritis 2004      Abd. CT (-) 04/2005   ABI nml 05/2006   CXR, COPD, osteopenia 11/2006   DXA nml. T + 1.32  Family History: Reviewed history from 05/05/2007 and no changes required. Father: Died 75, dementia Mother: Died 85, complete heart block Siblings: 6 brothers, lung CA, sinus CA 2 sisters, one with Colon CA, one with dementia CV:  (+) HBP:  (+) DM:  Brother Colon CA:  Sister  Social History: Reviewed history from 01/26/2010 and no changes required. Former Smoker: > 1 ppd, quit 1986 Alcohol use-yes, light Drug use-no Lives alone Children: 3 daughters, 1 RN Occupation: Retired, Therapist, nutritional Exercise:  Golf Diet:  3 meals a day  Review of Systems General:  Denies fatigue and fever. CV:  Denies shortness of breath with exertion and swelling of feet. Resp:  Denies shortness of breath. GI:  Denies abdominal pain. GU:  Denies dysuria.  Physical Exam  General:  elderly female in NAD Eyes:  No corneal or conjunctival inflammation noted. EOMI. Perrla. Funduscopic exam benign, without clearhemorrhages, exudates or papilledema. Vision grossly normal in left, absent in right due to past retinal vein occlusion.  Ears:  External ear exam shows no significant lesions or deformities.  Otoscopic examination reveals clear canals, tympanic membranes are intact bilaterally without bulging, retraction, inflammation or discharge. Hearing is grossly normal bilaterally. Nose:  External nasal examination shows no deformity or inflammation. Nasal mucosa are pink and moist  without lesions or exudates. Mouth:  MMM Neck:  No deformities, masses, or tenderness noted. Lungs:  normal respiratory effort, no intercostal retractions, and no accessory muscle use. No wheezes or crackles. Heart:  Normal rate and regular rhythm. S1 and S2 normal without gallop, murmur, click, rub or other extra sounds. Pulses:  R and L posterior tibial pulses are full and equal bilaterally  Extremities:  trace left pedal edema and trace right pedal edema.     Impression & Recommendations:  Problem # 1:  LEG PAIN, BILATERAL (ICD-729.5) Improved off cholesterol med.  Problem # 2:  HYPERCHOLESTEROLEMIA, PURE (ICD-272.0) She wishes tpo continue to follow this and consider a different medicaiton if elevated..has history of retinal vein occulsion..no CVA, NO CAD, HAs PVD listed but pt denies.  Problem # 3:  FEVER UNSPECIFIED (ICD-780.60) Resolved.  Problem # 4:  COPD (ICD-496) stable on no medicaiton.   Complete Medication List: 1)  Vitamin E 1000 Unit Caps (Vitamin e) .... Take 1 capsule by mouth once a day 2)  Aspirin 81 Mg  Tbec (Aspirin) .... Take 1 tablet by mouth once a day 3)  Celebrex 200 Mg Caps (Celecoxib) .... Take 1 capsule by mouth once a day 4)  Tylenol Pm Extra Strength Tabs (Diphenhydramine-apap (sleep) tabs) .... Take 1 tab by mouth at bedtime 5)  Bystolic 10 Mg Tabs (Nebivolol hcl) .... Take 1 tablet by mouth once a day 6)  Gabapentin 600 Mg Tabs (Gabapentin) .Marland Kitchen.. 1 tab by mouth at bedtime 7)  Losartan Potassium 100 Mg Tabs (Losartan potassium) .... Take 1/2 tablet daily 8)  Chlorthalidone 25 Mg Tabs (Chlorthalidone) .... Take1/2 tablet by mouth daily 9)  Enablex 15 Mg Xr24h-tab (Darifenacin hydrobromide) .Marland Kitchen.. 1 tab by mouth daily 10)  Hydralazine Hcl 50 Mg Tabs (Hydralazine hcl) .... Take one tablet by mouth three times a day  Patient Instructions: 1)  Schedule CPX in next 3 months. 2)  BMP prior to visit, ICD-9: 272.0 3)  Hepatic Panel prior to visit ICD-9:  4)   Lipid panel prior to visit ICD-9 :   Current Allergies (reviewed today): ! PENICILLIN V POTASSIUM (PENICILLIN V POTASSIUM) ! LEVAQUIN (LEVOFLOXACIN)

## 2010-11-14 NOTE — Miscellaneous (Signed)
Summary: med list update- chlorthalidone  Medications Added CHLORTHALIDONE 100 MG TABS (CHLORTHALIDONE) take one by mouth daily       Clinical Lists Changes  Medications: Changed medication from CHLORTHALIDONE 50 MG TABS (CHLORTHALIDONE) 1 tab by mouth daily to CHLORTHALIDONE 100 MG TABS (CHLORTHALIDONE) take one by mouth daily     Prior Medications: MULTIVITAMINS  TABS (MULTIPLE VITAMIN) Take 1 tablet by mouth once a day VITAMIN E 1000 UNIT CAPS (VITAMIN E) Take 1 capsule by mouth once a day ASPIRIN 81 MG TBEC (ASPIRIN) Take 1 tablet by mouth once a day CELEBREX 200 MG CAPS (CELECOXIB) Take 1 capsule by mouth once a day TYLENOL PM EXTRA STRENGTH  TABS (DIPHENHYDRAMINE-APAP (SLEEP) TABS) Take 1 tab by mouth at bedtime FUROSEMIDE 20 MG TABS (FUROSEMIDE) Take 1 tablet by mouth once a day as needed swelling SIMVASTATIN 40 MG  TABS (SIMVASTATIN) Take 1 tablet by mouth once a day METOPROLOL TARTRATE 25 MG  TABS (METOPROLOL TARTRATE) Take 1 tablet by mouth every 12 hours GABAPENTIN 600 MG TABS (GABAPENTIN) 1 tab by mouth at bedtime PROMETHEGAN 25 MG SUPP (PROMETHAZINE HCL) 1 per rectum q 6 hours as needed for nausea LOSARTAN POTASSIUM 100 MG TABS (LOSARTAN POTASSIUM) 1 by mouth daily CHLORTHALIDONE 100 MG TABS (CHLORTHALIDONE) take one by mouth daily Current Allergies: ! PENICILLIN V POTASSIUM (PENICILLIN V POTASSIUM) ! LEVAQUIN (LEVOFLOXACIN)

## 2010-11-16 NOTE — Letter (Signed)
Summary: Results Follow up Letter  [place] [facility]  [address] [place], [place] [address]   Phone: [phone]  Fax: [phone]    10/06/2010 MRN: [id number]     [facility] [address] [place], [place]  [address]    Dear Ms. Turski,  The following are the results of your recent test(s):  Test         Result    Pap Smear:        Normal _____  Not Normal _____ Comments: ______________________________________________________ Cholesterol: LDL(Bad cholesterol):         Your goal is less than:         HDL (Good cholesterol):       Your goal is more than: Comments:  ______________________________________________________ Mammogram:        Normal ___x__  Not Normal _____ Comments:Repeat in 1 year  ___________________________________________________________________ Hemoccult:        Normal _____  Not normal _______ Comments:    _____________________________________________________________________ Other Tests:    We routinely do not discuss normal results over the telephone.  If you desire a copy of the results, or you have any questions about this information we can discuss them at your next office visit.   Sincerely,  Kerby Nora MD

## 2010-11-16 NOTE — Consult Note (Signed)
Summary: [facility]   Imported By: Lanelle Bal 10/02/2010 13:18:38  _____________________________________________________________________  External Attachment:    Type:   Image     Comment:   External Document

## 2010-11-16 NOTE — Consult Note (Signed)
Summary: [facility]   Imported By: Lanelle Bal 10/24/2010 09:37:06  _____________________________________________________________________  External Attachment:    Type:   Image     Comment:   External Document

## 2010-11-16 NOTE — Progress Notes (Signed)
Summary: refill request for mobic  Phone Note Refill Request Message from:  Patient  Refills Requested: Medication #1:  MELOXICAM 15 MG TABS Take 1 tablet by mouth once a day Pt is requesting a 90 day supply [facility]  Initial call taken by: Lowella Petties CMA, AAMA,  September 27, 2010 9:32 AM    Prescriptions: MELOXICAM 15 MG TABS (MELOXICAM) Take 1 tablet by mouth once a day  #90 x 3   Entered and Authorized by:   Kerby Nora MD   Signed by:   Kerby Nora MD on 09/27/2010   Method used:   Electronically to        [facility]* (mail-order)       [address]       [place], [place]  [address]       Ph: [phone]       Fax: [phone]   RxID:   [id number]

## 2010-11-29 ENCOUNTER — Encounter: Payer: Self-pay | Admitting: Family Medicine

## 2010-11-30 ENCOUNTER — Telehealth: Payer: Self-pay | Admitting: Family Medicine

## 2010-11-30 NOTE — Letter (Signed)
Summary: [facility]  [facility] note   Imported By: Kassie Mends 11/20/2010 10:13:09  _____________________________________________________________________  External Attachment:    Type:   Image     Comment:   External Document

## 2010-12-01 ENCOUNTER — Telehealth: Payer: Self-pay | Admitting: Family Medicine

## 2010-12-05 ENCOUNTER — Telehealth: Payer: Self-pay | Admitting: Family Medicine

## 2010-12-06 ENCOUNTER — Other Ambulatory Visit (INDEPENDENT_AMBULATORY_CARE_PROVIDER_SITE_OTHER): Payer: MEDICARE

## 2010-12-06 ENCOUNTER — Encounter (INDEPENDENT_AMBULATORY_CARE_PROVIDER_SITE_OTHER): Payer: Self-pay | Admitting: *Deleted

## 2010-12-06 ENCOUNTER — Other Ambulatory Visit: Payer: Self-pay | Admitting: Family Medicine

## 2010-12-06 DIAGNOSIS — N183 Chronic kidney disease, stage 3 (moderate): Secondary | ICD-10-CM

## 2010-12-06 DIAGNOSIS — Z79899 Other long term (current) drug therapy: Secondary | ICD-10-CM

## 2010-12-06 DIAGNOSIS — I5032 Chronic diastolic (congestive) heart failure: Secondary | ICD-10-CM

## 2010-12-06 DIAGNOSIS — I509 Heart failure, unspecified: Secondary | ICD-10-CM

## 2010-12-06 DIAGNOSIS — R0989 Other specified symptoms and signs involving the circulatory and respiratory systems: Secondary | ICD-10-CM

## 2010-12-06 DIAGNOSIS — R0609 Other forms of dyspnea: Secondary | ICD-10-CM

## 2010-12-06 LAB — IBC PANEL
Iron: 32 ug/dL — ABNORMAL LOW (ref 42–145)
Transferrin: 182.9 mg/dL — ABNORMAL LOW (ref 212.0–360.0)

## 2010-12-06 LAB — BASIC METABOLIC PANEL
CO2: 21 mEq/L (ref 19–32)
GFR: 26.65 mL/min — ABNORMAL LOW (ref >60.00)
Glucose, Bld: 130 mg/dL — ABNORMAL HIGH (ref 70–99)
Potassium: 6 mEq/L — ABNORMAL HIGH (ref 3.5–5.1)
Sodium: 142 mEq/L (ref 135–145)

## 2010-12-06 LAB — CBC WITH DIFFERENTIAL/PLATELET
Basophils Absolute: 0.1 10*3/uL (ref 0.0–0.1)
Basophils Relative: 1.4 % (ref 0.0–3.0)
HCT: 30.1 % — ABNORMAL LOW (ref 36.0–46.0)
Hemoglobin: 10 g/dL — ABNORMAL LOW (ref 12.0–15.0)
Lymphs Abs: 1.5 10*3/uL (ref 0.7–4.0)
MCHC: 33.4 g/dL (ref 30.0–36.0)
Monocytes Relative: 10.8 % (ref 3.0–12.0)
Neutro Abs: 4.8 10*3/uL (ref 1.4–7.7)
RDW: 13.8 % (ref 11.5–14.6)

## 2010-12-06 LAB — FERRITIN: Ferritin: 79.8 ng/mL (ref 10.0–291.0)

## 2010-12-06 NOTE — Letter (Signed)
Summary: [facility]   [facility]   Imported By: Maryln Gottron 11/28/2010 14:59:43  _____________________________________________________________________  External Attachment:    Type:   Image     Comment:   External Document

## 2010-12-06 NOTE — Progress Notes (Signed)
Summary: wants cardiology referral  Phone Note Call from Patient   Caller: Patient Call For: Kerby Nora MD Summary of Call: Patient went to see Dr. Shelah Lewandowsky office yesterday and he told her that she needed to see a cardiology and wants to know if you can do the referral.Heather Humberto Leep CMA    Patient would rather go to [place] if possible Initial call taken by: Benny Lennert CMA Duncan Dull),  November 30, 2010 4:29 PM

## 2010-12-07 ENCOUNTER — Encounter: Payer: Self-pay | Admitting: Family Medicine

## 2010-12-07 ENCOUNTER — Other Ambulatory Visit (INDEPENDENT_AMBULATORY_CARE_PROVIDER_SITE_OTHER): Payer: MEDICARE

## 2010-12-07 ENCOUNTER — Encounter (INDEPENDENT_AMBULATORY_CARE_PROVIDER_SITE_OTHER): Payer: Self-pay | Admitting: *Deleted

## 2010-12-07 DIAGNOSIS — E875 Hyperkalemia: Secondary | ICD-10-CM

## 2010-12-07 LAB — CONVERTED CEMR LAB
CO2: 18 meq/L — ABNORMAL LOW (ref 19–32)
Chloride: 112 meq/L (ref 96–112)
Creatinine, Ser: 2.1 mg/dL — ABNORMAL HIGH (ref 0.40–1.20)

## 2010-12-08 ENCOUNTER — Encounter: Payer: Self-pay | Admitting: Family Medicine

## 2010-12-08 ENCOUNTER — Ambulatory Visit (INDEPENDENT_AMBULATORY_CARE_PROVIDER_SITE_OTHER): Payer: Medicare Other | Admitting: Family Medicine

## 2010-12-08 DIAGNOSIS — K5900 Constipation, unspecified: Secondary | ICD-10-CM | POA: Insufficient documentation

## 2010-12-08 DIAGNOSIS — N183 Chronic kidney disease, stage 3 unspecified: Secondary | ICD-10-CM

## 2010-12-08 DIAGNOSIS — E875 Hyperkalemia: Secondary | ICD-10-CM

## 2010-12-08 DIAGNOSIS — R0609 Other forms of dyspnea: Secondary | ICD-10-CM | POA: Insufficient documentation

## 2010-12-08 DIAGNOSIS — R0989 Other specified symptoms and signs involving the circulatory and respiratory systems: Secondary | ICD-10-CM

## 2010-12-11 ENCOUNTER — Ambulatory Visit (INDEPENDENT_AMBULATORY_CARE_PROVIDER_SITE_OTHER): Payer: Medicare Other | Admitting: Cardiovascular Disease

## 2010-12-11 ENCOUNTER — Encounter: Payer: Self-pay | Admitting: Cardiovascular Disease

## 2010-12-11 DIAGNOSIS — R0602 Shortness of breath: Secondary | ICD-10-CM

## 2010-12-11 DIAGNOSIS — I2789 Other specified pulmonary heart diseases: Secondary | ICD-10-CM

## 2010-12-11 DIAGNOSIS — R609 Edema, unspecified: Secondary | ICD-10-CM | POA: Insufficient documentation

## 2010-12-11 DIAGNOSIS — I519 Heart disease, unspecified: Secondary | ICD-10-CM

## 2010-12-12 ENCOUNTER — Encounter: Payer: Self-pay | Admitting: Cardiovascular Disease

## 2010-12-12 ENCOUNTER — Encounter: Payer: Self-pay | Admitting: Internal Medicine

## 2010-12-12 NOTE — Assessment & Plan Note (Signed)
Summary: 3 mo f/u Resch 11/22 mailed ltr DLO Confirmed appt   Vital Signs:  Patient profile:   77 year old female Height:      62.25 inches Weight:      140 pounds BMI:     25.49 O2 Sat:      97 % Temp:     98.6 degrees F oral Pulse rate:   62 / minute Pulse rhythm:   regular Resp:     16 per minute BP sitting:   120 / 80  (left arm) Cuff size:   regular  Vitals Entered ByMelody Comas (December 08, 2010 11:00 AM) CC: 3 month follow up   History of Present Illness: 77 year old female with history of CKD stage 3, COPD, diastolic heart failure... comes to clinic for follow up following recent cdifficile infection.  Followed by sinus infection treatment by ENT.  Labs done at GI showed.. Hg 10 (11.1 in 03/2010) Cr 2.4  Has upcoming appt 3/5 with nephrology (Dr. Thedore Mins) ON no other nephrotoxic meds.  Has had  mobic stopped  due to kidney issues.  In last 3-4 weeks.. She has noted worsening peripheral edema  (mainly on right (had nml venous dopplers in 09/2010)) and  increased dyspnea with exertion.  Last ECHO 01/2010.   No increase SOB with lying flat at night, no PND. Does mention a couple of episdoes few weeks ago of central chest pain at rest... resolved after few minutes.  Labs prior to todays appt showed... Cr back at baseline 1.9-2.1 Potassium was high, but now back in nml range. Anemia, stable (Hg 10) appears multifactorial due to iron def as well as due to chronic disease.  ALSO BNP very elevated.  Has appt with Dr. Mariah Milling on Monday. Has lab appt for kidneys on 2/29  Last CXR 07/2010  Problems Prior to Update: 1)  Constipation  (ICD-564.00) 2)  Dyspnea On Exertion  (ICD-786.09) 3)  Hyperkalemia  (ICD-276.7) 4)  Encounter For Long-term Use of Other Medications  (ICD-V58.69) 5)  Diastolic Heart Failure, Chronic  (ICD-428.32) 6)  Carotid Bruit, Right  (ICD-785.9) 7)  Incontinence, Urge  (ICD-788.31) 8)  Other Screening Mammogram  (ICD-V76.12) 9)  Special  Screening For Osteoporosis  (ICD-V82.81) 10)  Special Screening Malig Neoplasms Other Sites  (ICD-V76.49) 11)  Peripheral Neuropathy, Idiopathic  (ICD-356.9) 12)  Dysuria, Chronic  (ICD-788.1) 13)  Stress Incontinence  (ICD-788.39) 14)  Venous Insufficiency  (ICD-459.81) 15)  Degeneration, Cervical Disc  (ICD-722.4) 16)  Retinal Vein Occlusion, Blind in Right Eye  (ICD-362.30) 17)  Peripheral Vascular Disease  (ICD-443.9) 18)  Osteoarthritis  (ICD-715.90) 19)  Gerd  (ICD-530.81) 20)  Diverticulosis, Colon  (ICD-562.10) 21)  COPD  (ICD-496) 22)  Allergic Rhinitis  (ICD-477.9) 23)  Renal Stone  (ICD-592.9) 24)  Hypercholesterolemia, Pure  (ICD-272.0) 25)  Cardiac Murmur  (ICD-785.2) 26)  Kidney Disease, Chronic, Stage Iii  (ICD-585.3) 27)  Essential Hypertension  (ICD-401.9)  Allergies: 1)  ! Penicillin V Potassium (Penicillin V Potassium) 2)  ! Levaquin (Levofloxacin)  Past History:  Past medical, surgical, family and social histories (including risk factors) reviewed, and no changes noted (except as noted below).  Past Medical History: Reviewed history from 03/02/2010 and no changes required. 1. Allergic rhinitis 2. COPD 3. Diverticulosis, colon 4. GERD 5. Hyperlipidemia 6. Hypertension: Lower extremity swelling with amlodipine, intolerant of ACEIs 7. Osteoarthritis 8. CKD 9. Impaired vision right eye, was told this was due to HTN 10.  Normal cath in 1999  11.  LBBB 12.  Diastolic CHF: Echo (4/11) with EF 60-65%, mild LVH, mild aortic insufficiency, mild MR, PA systolic pressure 37-41 mmHg, mild to moderate TR.   Past Surgical History: Reviewed history from 05/05/2007 and no changes required. 2003      Hospitalized colitis, pseudomembranous - C.Diff 1970      Total hysterectomy - suspicious cells 1980      Cholecystectomy 1999      Stress test - ischemia 1999      Cath - nml. 2000      PFT's 2005      C-spine - cerv. disc  dz.  C5-C7, facet arthritis 2004      Abd.  CT (-) 04/2005   ABI nml 05/2006   CXR, COPD, osteopenia 11/2006   DXA nml. T + 1.32  Family History: Reviewed history from 05/05/2007 and no changes required. Father: Died 39, dementia Mother: Died 57, complete heart block Siblings: 6 brothers, lung CA, sinus CA 2 sisters, one with Colon CA, one with dementia CV:  (+) HBP:  (+) DM:  Brother Colon CA:  Sister  Social History: Reviewed history from 01/26/2010 and no changes required. Former Smoker: > 1 ppd, quit 1986 Alcohol use-yes, light Drug use-no Lives alone Children: 3 daughters, 1 RN Occupation: Retired, Therapist, nutritional Exercise:  Golf Diet:  3 meals a day  Review of Systems       Constipation following c difficile..having daily BM but small pellets  Has to push down to urinate, NO BURNING. Urinating frequently but not excessively.  Physical Exam  General:  Well appearing female iN NAD  Mouth:  MMM Neck:  no carotid bruit or thyromegaly no cervical or supraclavicular lymphadenopathy  Lungs:  faint crackles at bases of lungs B, no wheeze, no rhonchi, good air moveemtn throughout Heart:  Normal rate and regular rhythm. S1 and S2 normal without gallop, murmur, click, rub or other extra sounds. Abdomen:  Bowel sounds positive,abdomen soft and non-tender without masses, organomegaly or hernias noted. Pulses:  R and L posterior tibial pulses are full and equal bilaterally  Extremities:  varicose veins, chronic venous insufficiency cahnges  1+ left pedal edema and 2+ right pedal edema.   Psych:  Cognition and judgment appear intact. Alert and cooperative with normal attention span and concentration. No apparent delusions, illusions, hallucinations   Impression & Recommendations:  Problem # 1:  DYSPNEA ON EXERTION (ICD-786.09) EKG today showed: changes compared to 01/2010..? new LVH, new Q waves, new LBBB Concern for new change in heart function/ systolic heart failure... BNP elevated. High risk for cardiac issues with  hx of PVD/retinal vein occlusion.  Likely needs stress test and repeat ECHO. On ASA 81 mg daily. On BB. Not on ACE, dig, fluid pill.  Will start low dose lasix... need to be cognisant of chronic renal disease. Keep appt on 2/27 with Dr. Mariah Milling, Cards.  Pt also has COPD.. but no clear wheezing or sign of infection on todays exam.   Her updated medication list for this problem includes:    Bystolic 10 Mg Tabs (Nebivolol hcl) .Marland Kitchen... Take 1 tablet by mouth once a day    Proventil Hfa 108 (90 Base) Mcg/act Aers (Albuterol sulfate) .Marland Kitchen... 2 puffs every 4 hours    Spiriva Handihaler 18 Mcg Caps (Tiotropium bromide monohydrate) .Marland Kitchen... 1 inhalation daily    Furosemide 20 Mg Tabs (Furosemide) .Marland Kitchen... 1 tab by mouth daily until recommendations made by cardiology  Orders: EKG w/ Interpretation (93000)  Problem #  2:  KIDNEY DISEASE, CHRONIC, STAGE III (ICD-585.3) Has appt for labs 2/29 and follow up 3/5... may need to check BMET earlier after starting lasix.   Problem # 3:  HYPERKALEMIA (ICD-276.7) resolved. Likely to not recur on lasix... will hold off on potassium supplement until recheck labs.   Problem # 4:  CONSTIPATION (ICD-564.00) Hold on new med given ongoing issues... try to increase fiber in diet.   Problem # 5:  ESSENTIAL HYPERTENSION (ICD-401.9)  Well controlled. Continue current medication.  Her updated medication list for this problem includes:    Bystolic 10 Mg Tabs (Nebivolol hcl) .Marland Kitchen... Take 1 tablet by mouth once a day    Hydralazine Hcl 50 Mg Tabs (Hydralazine hcl) .Marland Kitchen... Take one tablet by mouth three times a day    Norvasc 5 Mg Tabs (Amlodipine besylate) ..... One tablet daily    Furosemide 20 Mg Tabs (Furosemide) .Marland Kitchen... 1 tab by mouth daily until recommendations made by cardiology  BP today: 120/80 Prior BP: 120/60 (09/19/2010)  Prior 10 Yr Risk Heart Disease: 9 % (08/25/2010)  Labs Reviewed: K+: 5.1 (12/07/2010) Creat: : 2.10 (12/07/2010)   Chol: 211 (08/17/2010)   HDL:  41.00 (08/17/2010)   LDL: 80 (04/24/2010)   TG: 161.0 (08/17/2010)  Complete Medication List: 1)  Vitamin E 1000 Unit Caps (Vitamin e) .... Take 1 capsule by mouth once a day 2)  Aspirin 81 Mg Tbec (Aspirin) .... Take 1 tablet by mouth once a day 3)  Tylenol Pm Extra Strength Tabs (Diphenhydramine-apap (sleep) tabs) .... Take 1 tab by mouth at bedtime 4)  Bystolic 10 Mg Tabs (Nebivolol hcl) .... Take 1 tablet by mouth once a day 5)  Gabapentin 600 Mg Tabs (Gabapentin) .Marland Kitchen.. 1 tab by mouth at bedtime 6)  Enablex 15 Mg Xr24h-tab (Darifenacin hydrobromide) .Marland Kitchen.. 1 tab by mouth daily 7)  Hydralazine Hcl 50 Mg Tabs (Hydralazine hcl) .... Take one tablet by mouth three times a day 8)  Proventil Hfa 108 (90 Base) Mcg/act Aers (Albuterol sulfate) .... 2 puffs every 4 hours 9)  Ceftin 250 Mg Tabs (Cefuroxime axetil) .... One tablet 2 times daily 10)  Norvasc 5 Mg Tabs (Amlodipine besylate) .... One tablet daily 11)  Spiriva Handihaler 18 Mcg Caps (Tiotropium bromide monohydrate) .Marland Kitchen.. 1 inhalation daily 12)  Fluticasone Propionate 50 Mcg/act Susp (Fluticasone propionate) .... 2 sprays per nostril before bed. 13)  Furosemide 20 Mg Tabs (Furosemide) .Marland Kitchen.. 1 tab by mouth daily until recommendations made by cardiology  Patient Instructions: 1)  Start furosemide (lasix) one tab by mouth daily until further recommendations made by cardiologist. 2)  Go to ER if severe SOB, chest pain. 3)   Follow up in 2 weeks or earlier if needed . 30 min OV.  Prescriptions: FUROSEMIDE 20 MG TABS (FUROSEMIDE) 1 tab by mouth daily until recommendations made by cardiology  #20 x 0   Entered and Authorized by:   Kerby Nora MD   Signed by:   Kerby Nora MD on 12/08/2010   Method used:   Electronically to        [facility]* (retail)       [address]       [place], [place]  [address]       Ph: [phone]       Fax: [phone]   RxID:   [id number]    Orders Added: 1)  EKG w/  Interpretation [93000] 2)  Est. Patient Level IV [84132]  Current Allergies (reviewed today): ! PENICILLIN V POTASSIUM (PENICILLIN V POTASSIUM) ! LEVAQUIN (LEVOFLOXACIN)

## 2010-12-12 NOTE — Progress Notes (Signed)
Summary: does pt need labs prior to visit?  Phone Note Call from Patient Call back at [facility] Phone [phone]   Caller: Patient Call For: Kerby Nora MD Summary of Call: Pt has appt to see you on friday, she is asking if she should have labs prior, please advise.             Lowella Petties CMA, AAMA  December 05, 2010 3:53 PM   Follow-up for Phone Call        Yes, BMET, cbc with diff,  iron total, ferritin, transferrin, B12 Dx 585.3, BNP Dx 482.3 Follow-up by: Kerby Nora MD,  December 05, 2010 4:25 PM  Additional Follow-up for Phone Call Additional follow up Details #1::        Patient notified. Lab appt scheduled. Additional Follow-up by: Melody Comas,  December 06, 2010 9:21 AM

## 2010-12-12 NOTE — Progress Notes (Signed)
Summary: Discuss patient  Phone Note From Other Clinic Call back at [phone] ext [phone]   Caller: Fransico Setters, NP-[facility] Call For: Dr. Ermalene Searing Summary of Call: Fransico Setters, NP called and wants Dr. Ermalene Searing to call her back to discuss this patient. Initial call taken by: Linde Gillis CMA Duncan Dull),  December 01, 2010 4:47 PM  Follow-up for Phone Call        Called.. no answer at extension.. please try contacting again for me to Get Ms. Arvilla Market on the phone before the end of the day.  Follow-up by: Kerby Nora MD,  December 01, 2010 5:06 PM  Additional Follow-up for Phone Call Additional follow up Details #1::        Tried calling back, no answer.  I left message with answering service, gave Dr. Daphine Deutscher cell phone number.  They will try to page NP.          Lowella Petties CMA, AAMA  December 01, 2010 5:25 PM  Additional Follow-up by: Kerby Nora MD,  December 01, 2010 5:30 PM    Additional Follow-up for Phone Call Additional follow up Details #2::    please get this lady on the phone today..   Pull me out of a room.  Follow-up by: Kerby Nora MD,  December 05, 2010 8:21 AM  Additional Follow-up for Phone Call Additional follow up Details #3:: Details for Additional Follow-up Action Taken: [employer] reached.Consuello Masse CMA    Seeing GI for Cdiff.. resolved Unfortunatley restarted on clindamycin by ENT.. but did well per GI NP.  Hg 10 (slight drop from previous. Cr 2.4  Has upcoming appt 3/5 with nephrology ON no other nephrotoxic meds.  Stopped mobic due to kidney issues.  This pt has follow up with me on 2/24.   Additional Follow-up by: Kerby Nora MD,  December 05, 2010 8:32 AM

## 2010-12-13 ENCOUNTER — Encounter: Payer: Self-pay | Admitting: Family Medicine

## 2010-12-13 ENCOUNTER — Encounter (INDEPENDENT_AMBULATORY_CARE_PROVIDER_SITE_OTHER): Payer: Self-pay | Admitting: *Deleted

## 2010-12-13 LAB — CONVERTED CEMR LAB
BUN: 47 mg/dL
Creatinine, Ser: 2.04 mg/dL
HCT: 36.5 %
Potassium, serum: 4.2 mmol/L
Sodium, serum: 140 mmol/L
WBC: 8.4 10*3/uL

## 2010-12-18 ENCOUNTER — Encounter: Payer: Self-pay | Admitting: Family Medicine

## 2010-12-21 NOTE — Assessment & Plan Note (Signed)
Summary: diastolic heart failure/dr.bedsole/sab  Medications Added FUROSEMIDE 20 MG TABS (FUROSEMIDE) 1 tablet every other day      Allergies Added:   Visit Type:  Follow-up Primary Provider:  Kerby Nora MD  CC:  c/o SOB. Denies chest pains and palpitations..  History of Present Illness: 77 yo with history of difficult to control HTN, COPD, hyperlipidemia, left bundle-branch block, present today for routine followup.   she reports having lower extremity edema, most notably in the right lower extremity for the past 4 weeks. She has also had shortness of breath with exertion.  She was started on Lasix 20 mg daily and over the past 4 days, has had a 7 pound weight drop. The edema in the right lower extremity has improved. She does report having a venous ultrasound of the right lower extremity that showed no DVT.   BNP was noted to be 1123, hematocrit of 30, creatinine 2.1  She feels that her breathing has improved but was still not optimal. She continues to have mild edema of the right lower extremity.  she is having some difficulty walking. She does not want to use a cane and her daughter reports that she is to "proud".  Echo was suggestive of diastolic dysfunction with normal EF (60-65%).  Patient is not short of breath walking on flat ground but does get short of breath walking up a flight of steps.    EKG shows normal sinus rhythm with left bundle branch block, rate 74 beats per minute  Current Medications (verified): 1)  Vitamin E 1000 Unit Caps (Vitamin E) .... Take 1 Capsule By Mouth Once A Day 2)  Aspirin 81 Mg Tbec (Aspirin) .... Take 1 Tablet By Mouth Once A Day 3)  Tylenol Pm Extra Strength  Tabs (Diphenhydramine-Apap (Sleep) Tabs) .... Take 1 Tab By Mouth At Bedtime 4)  Bystolic 10 Mg Tabs (Nebivolol Hcl) .... Take 1 Tablet By Mouth Once A Day 5)  Gabapentin 600 Mg Tabs (Gabapentin) .Marland Kitchen.. 1 Tab By Mouth At Bedtime 6)  Enablex 15 Mg Xr24h-Tab (Darifenacin Hydrobromide) .Marland Kitchen..  1 Tab By Mouth Daily 7)  Hydralazine Hcl 50 Mg Tabs (Hydralazine Hcl) .... Take One Tablet By Mouth Three Times A Day 8)  Proventil Hfa 108 (90 Base) Mcg/act Aers (Albuterol Sulfate) .... 2 Puffs Every 4 Hours 9)  Ceftin 250 Mg Tabs (Cefuroxime Axetil) .... One Tablet 2 Times Daily 10)  Norvasc 5 Mg Tabs (Amlodipine Besylate) .... One Tablet Daily 11)  Spiriva Handihaler 18 Mcg Caps (Tiotropium Bromide Monohydrate) .Marland Kitchen.. 1 Inhalation Daily 12)  Fluticasone Propionate 50 Mcg/act Susp (Fluticasone Propionate) .... 2 Sprays Per Nostril Before Bed. 13)  Furosemide 20 Mg Tabs (Furosemide) .Marland Kitchen.. 1 Tab By Mouth Daily Until Recommendations Made By Cardiology  Allergies (verified): 1)  ! Penicillin V Potassium (Penicillin V Potassium) 2)  ! Levaquin (Levofloxacin)  Past History:  Past Medical History: Last updated: 03/02/2010 1. Allergic rhinitis 2. COPD 3. Diverticulosis, colon 4. GERD 5. Hyperlipidemia 6. Hypertension: Lower extremity swelling with amlodipine, intolerant of ACEIs 7. Osteoarthritis 8. CKD 9. Impaired vision right eye, was told this was due to HTN 10.  Normal cath in 1999 11.  LBBB 12.  Diastolic CHF: Echo (4/11) with EF 60-65%, mild LVH, mild aortic insufficiency, mild MR, PA systolic pressure 37-41 mmHg, mild to moderate TR.   Past Surgical History: Last updated: 05/05/2007 2003      Hospitalized colitis, pseudomembranous - C.Diff 1970      Total hysterectomy - suspicious  cells 1980      Cholecystectomy 1999      Stress test - ischemia 1999      Cath - nml. 2000      PFT's 2005      C-spine - cerv. disc  dz.  C5-C7, facet arthritis 2004      Abd. CT (-) 04/2005   ABI nml 05/2006   CXR, COPD, osteopenia 11/2006   DXA nml. T + 1.32  Family History: Last updated: 05/29/2007 Father: Died 4, dementia Mother: Died 38, complete heart block Siblings: 6 brothers, lung CA, sinus CA 2 sisters, one with Colon CA, one with dementia CV:  (+) HBP:  (+) DM:  Brother Colon  CA:  Sister  Social History: Last updated: 01/26/2010 Former Smoker: > 1 ppd, quit 1986 Alcohol use-yes, light Drug use-no Lives alone Children: 3 daughters, 1 RN Occupation: Retired, Therapist, nutritional Exercise:  Golf Diet:  3 meals a day  Risk Factors: Smoking Status: quit (05/29/07)  Review of Systems       The patient complains of peripheral edema and difficulty walking.  The patient denies fever, weight loss, weight gain, vision loss, decreased hearing, hoarseness, chest pain, syncope, dyspnea on exertion, prolonged cough, abdominal pain, incontinence, muscle weakness, depression, and enlarged lymph nodes.    Vital Signs:  Patient profile:   77 year old female Height:      62.25 inches Weight:      133.50 pounds BMI:     24.31 Pulse rate:   74 / minute BP sitting:   149 / 66  (left arm) Cuff size:   regular  Vitals Entered By: Lysbeth Galas CMA (December 11, 2010 2:45 PM)  Physical Exam  General:  Well developed, well nourished, in no acute distress.elderly woman with very slow unsteady gait Head:  normocephalic and atraumatic Neck:  Neck supple, no JVD. No masses, thyromegaly or abnormal cervical nodes. Lungs:  Clear bilaterally to auscultation and percussion. Heart:  Non-displaced PMI, chest non-tender; regular rate and rhythm, S1, S2 without murmurs, rubs or gallops. Carotid upstroke normal, 1+ bruits b/l. Pedals normal pulses. 1+ edema on the right lower extremity, TED hose in place bilaterally, no varicosities. Abdomen:  Bowel sounds positive; abdomen soft and non-tender without masses Msk:  Back normal, normal gait. Muscle strength and tone normal. Pulses:  pulses normal in all 4 extremities Extremities:  No clubbing or cyanosis. Neurologic:  Alert and oriented x 3. Skin:  Intact without lesions or rashes. Psych:  Normal affect.   Impression & Recommendations:  Problem # 1:  EDEMA (ICD-782.3) unilateral edema on the right lower extremity. She reports a  normal ultrasound with no DVT. Edema has improved with diuresis though she has had a 7 pound weight loss in 3 days. We have suggested she decrease her Lasix to every other day. She is scheduled to have blood work on Wednesday for Dr. Thedore Mins with followup in his office one week from today. I would recommend that if her creatinine has climbed, if her edema has resolved, we could change her Lasix to as needed for edema or possibly every third day.  We could continue every other day if her kidney function is stable and she continues to have mild edema.  We will order an echocardiogram given her murmur and shortness of breath with edema. we will hold off on a stress test until we have a chance to evaluate her echocardiogram. if she does not feel back to normal at that time, she  may benefit from a lexiscan Myoview.  of note, she is anemic and this could also be contributing to her shortness of breath. Her hemoglobin has dropped from last year, iron studies showed low iron saturation.  Problem # 2:  DIASTOLIC HEART FAILURE, CHRONIC (ICD-428.32) normal LV systolic function back in 2008. Presentation is consistent with diastolic heart failure with elevated BNP and edema. Significant improvement on diuretic. Echo pending.  Her updated medication list for this problem includes:    Aspirin 81 Mg Tbec (Aspirin) .Marland Kitchen... Take 1 tablet by mouth once a day    Bystolic 10 Mg Tabs (Nebivolol hcl) .Marland Kitchen... Take 1 tablet by mouth once a day    Norvasc 5 Mg Tabs (Amlodipine besylate) ..... One tablet daily    Furosemide 20 Mg Tabs (Furosemide) .Marland Kitchen... 1 tablet every other day  Problem # 3:  CAROTID BRUIT, RIGHT (ICD-785.9) She does have a carotid bruit. Initially we ordered a carotid ultrasound though found an ultrasound from May 2011 showing normal carotids.  Problem # 4:  KIDNEY DISEASE, CHRONIC, STAGE III (ICD-585.3) Baseline creatinine has been 2.1. Will defer additional renal workup to Dr. Thedore Mins. Uncertain if  she needs iron supplements and/or Ito for her anemia. Will defer to Dr. Ermalene Searing and Thedore Mins.  Problem # 5:  ESSENTIAL HYPERTENSION (ICD-401.9) blood pressure is borderline elevated though improved on recheck with systolic pressures in the high 130s.  Her updated medication list for this problem includes:    Aspirin 81 Mg Tbec (Aspirin) .Marland Kitchen... Take 1 tablet by mouth once a day    Bystolic 10 Mg Tabs (Nebivolol hcl) .Marland Kitchen... Take 1 tablet by mouth once a day    Hydralazine Hcl 50 Mg Tabs (Hydralazine hcl) .Marland Kitchen... Take one tablet by mouth three times a day    Norvasc 5 Mg Tabs (Amlodipine besylate) ..... One tablet daily    Furosemide 20 Mg Tabs (Furosemide) .Marland Kitchen... 1 tablet every other day  Problem # 6:  HYPERCHOLESTEROLEMIA, PURE (ICD-272.0) Recent cholesterol is elevated. We will discuss this with her on her next clinic visit.  Other Orders: EKG w/ Interpretation (93000) Carotid Duplex (Carotid Duplex) Echocardiogram (Echo)  Patient Instructions: 1)  Your physician recommends that you schedule a follow-up appointment in: After Echo & Carotid U/S. 2)  Your physician has recommended you make the following change in your medication: Change Lasix to every other day. 3)  Your physician has requested that you have a carotid duplex. This test is an ultrasound of the carotid arteries in your neck. It looks at blood flow through these arteries that supply the brain with blood. Allow one hour for this exam. There are no restrictions or special instructions. 4)  Your physician has requested that you have an echocardiogram.  Echocardiography is a painless test that uses sound waves to create images of your heart. It provides your doctor with information about the size and shape of your heart and how well your heart's chambers and valves are working.  This procedure takes approximately one hour. There are no restrictions for this procedure.  Appended Document: diastolic heart failure/dr.bedsole/sab Previous  echo found from April 2011 showing normal systolic function, mild to moderate TR with mildly elevated right ventricular systolic pressures consistent with mild pulmonary hypertension. Also with diastolic dysfunction.  I suspect her symptoms could be secondary to fluid overload in the setting of diastolic dysfunction though we will recheck her echocardiogram. She does have underlying hyperlipidemia and given her age, a stress test should be discussed that we  will leave this up to her decision on her next visit in the near future

## 2010-12-21 NOTE — Letter (Signed)
Summary: [facility]  [facility]   Imported By: Maryln Gottron 12/13/2010 10:27:30  _____________________________________________________________________  External Attachment:    Type:   Image     Comment:   External Document

## 2010-12-21 NOTE — Letter (Signed)
Summary: [facility]   Imported By: Kassie Mends 12/12/2010 08:58:44  _____________________________________________________________________  External Attachment:    Type:   Image     Comment:   External Document

## 2010-12-25 ENCOUNTER — Encounter: Payer: Self-pay | Admitting: Family Medicine

## 2010-12-25 ENCOUNTER — Ambulatory Visit (INDEPENDENT_AMBULATORY_CARE_PROVIDER_SITE_OTHER): Payer: Medicare Other | Admitting: Family Medicine

## 2010-12-25 DIAGNOSIS — R609 Edema, unspecified: Secondary | ICD-10-CM

## 2010-12-25 DIAGNOSIS — I5032 Chronic diastolic (congestive) heart failure: Secondary | ICD-10-CM

## 2010-12-25 DIAGNOSIS — R0989 Other specified symptoms and signs involving the circulatory and respiratory systems: Secondary | ICD-10-CM

## 2010-12-25 DIAGNOSIS — N189 Chronic kidney disease, unspecified: Secondary | ICD-10-CM

## 2010-12-25 DIAGNOSIS — I509 Heart failure, unspecified: Secondary | ICD-10-CM

## 2010-12-25 DIAGNOSIS — R011 Cardiac murmur, unspecified: Secondary | ICD-10-CM

## 2010-12-25 DIAGNOSIS — I10 Essential (primary) hypertension: Secondary | ICD-10-CM

## 2010-12-25 DIAGNOSIS — D631 Anemia in chronic kidney disease: Secondary | ICD-10-CM | POA: Insufficient documentation

## 2010-12-26 ENCOUNTER — Other Ambulatory Visit: Payer: Self-pay | Admitting: Cardiovascular Disease

## 2010-12-26 ENCOUNTER — Other Ambulatory Visit (INDEPENDENT_AMBULATORY_CARE_PROVIDER_SITE_OTHER): Payer: Medicare Other

## 2010-12-26 DIAGNOSIS — R0989 Other specified symptoms and signs involving the circulatory and respiratory systems: Secondary | ICD-10-CM

## 2010-12-26 NOTE — Letter (Signed)
Summary: Dr. Thedore Mins visit note   Dr. Thedore Mins visit note   Imported By: Kassie Mends 12/22/2010 08:37:25  _____________________________________________________________________  External Attachment:    Type:   Image     Comment:   External Document

## 2010-12-27 LAB — BASIC METABOLIC PANEL
BUN: 43 mg/dL — ABNORMAL HIGH (ref 6–23)
CO2: 20 mEq/L (ref 19–32)
Chloride: 113 mEq/L — ABNORMAL HIGH (ref 96–112)
Creatinine, Ser: 1.9 mg/dL — ABNORMAL HIGH (ref 0.4–1.2)
Potassium: 6.2 mEq/L — ABNORMAL HIGH (ref 3.5–5.1)

## 2010-12-27 LAB — CBC
HCT: 36.3 % (ref 36.0–46.0)
HCT: 40.1 % (ref 36.0–46.0)
MCH: 29.7 pg (ref 26.0–34.0)
MCH: 29.8 pg (ref 26.0–34.0)
MCV: 93.1 fL (ref 78.0–100.0)
MCV: 94.1 fL (ref 78.0–100.0)
RBC: 4.26 MIL/uL (ref 3.87–5.11)
RDW: 13.5 % (ref 11.5–15.5)
RDW: 13.6 % (ref 11.5–15.5)
WBC: 10.3 10*3/uL (ref 4.0–10.5)
WBC: 8.7 10*3/uL (ref 4.0–10.5)

## 2010-12-27 LAB — POCT CARDIAC MARKERS
CKMB, poc: 1.4 ng/mL (ref 1.0–8.0)
Myoglobin, poc: 125 ng/mL (ref 12–200)
Troponin i, poc: <0.05 ng/mL (ref 0.00–0.09)

## 2010-12-27 LAB — GLUCOSE, CAPILLARY

## 2010-12-27 LAB — CK TOTAL AND CKMB (NOT AT ARMC): Relative Index: INVALID (ref 0.0–2.5)

## 2010-12-27 LAB — COMPREHENSIVE METABOLIC PANEL
Alkaline Phosphatase: 62 U/L (ref 39–117)
BUN: 36 mg/dL — ABNORMAL HIGH (ref 6–23)
Creatinine, Ser: 1.83 mg/dL — ABNORMAL HIGH (ref 0.4–1.2)
Glucose, Bld: 165 mg/dL — ABNORMAL HIGH (ref 70–99)
Potassium: 4.6 mEq/L (ref 3.5–5.1)
Total Bilirubin: 0.3 mg/dL (ref 0.3–1.2)
Total Protein: 6.6 g/dL (ref 6.0–8.3)

## 2010-12-27 LAB — DIFFERENTIAL
Eosinophils Absolute: 0.1 10*3/uL (ref 0.0–0.7)
Eosinophils Relative: 1 % (ref 0–5)
Lymphocytes Relative: 13 % (ref 12–46)
Lymphs Abs: 1.3 10*3/uL (ref 0.7–4.0)
Monocytes Absolute: 1 10*3/uL (ref 0.1–1.0)

## 2010-12-27 LAB — URINALYSIS, ROUTINE W REFLEX MICROSCOPIC
Bilirubin Urine: NEGATIVE
Glucose, UA: NEGATIVE mg/dL
Hgb urine dipstick: NEGATIVE
Ketones, ur: NEGATIVE mg/dL
Protein, ur: NEGATIVE mg/dL
Urobilinogen, UA: 0.2 mg/dL (ref 0.0–1.0)

## 2010-12-27 LAB — MAGNESIUM: Magnesium: 1.8 mg/dL (ref 1.5–2.5)

## 2010-12-27 LAB — URINE CULTURE
Colony Count: >=100000
Culture  Setup Time: 201110230143

## 2010-12-27 LAB — URINE MICROSCOPIC-ADD ON

## 2010-12-28 ENCOUNTER — Ambulatory Visit (INDEPENDENT_AMBULATORY_CARE_PROVIDER_SITE_OTHER): Payer: Medicare Other | Admitting: Cardiovascular Disease

## 2010-12-28 ENCOUNTER — Encounter: Payer: Self-pay | Admitting: Cardiovascular Disease

## 2010-12-28 DIAGNOSIS — I509 Heart failure, unspecified: Secondary | ICD-10-CM

## 2010-12-28 DIAGNOSIS — R609 Edema, unspecified: Secondary | ICD-10-CM

## 2010-12-28 DIAGNOSIS — N189 Chronic kidney disease, unspecified: Secondary | ICD-10-CM

## 2010-12-28 DIAGNOSIS — R0602 Shortness of breath: Secondary | ICD-10-CM

## 2010-12-29 ENCOUNTER — Encounter (INDEPENDENT_AMBULATORY_CARE_PROVIDER_SITE_OTHER): Payer: Self-pay | Admitting: *Deleted

## 2011-01-02 NOTE — Assessment & Plan Note (Signed)
Summary: 30 MIN APPT 2 WEEK FOLLOW UP/[facility]   Vital Signs:  Patient profile:   77 year old female Height:      62.25 inches Weight:      133.50 pounds BMI:     24.31 Temp:     98.1 degrees F oral Pulse rate:   74 / minute Pulse rhythm:   regular BP sitting:   120 / 60  (left arm) Cuff size:   regular  Vitals Entered By: Benny Lennert CMA Duncan Dull) (December 25, 2010 10:26 AM)  History of Present Illness: Chief complaint 2 week follow up edema  78 yo with history of difficult to control HTN, COPD, hyperlipidemia, left bundle-branch block, present today for  followup of peripheral swelling and SOB. .  Edema:   Started on LAasix 20 mg daily.Marland Kitchen lost 7 lbs. Saw Dr Mariah Milling 2/27  .Marland Kitchen..felt consistent with diastolic heart failure with elevated BNP and edema. Significant improvement with both SOB and swelling  on diuretic Has Echo and carotid doppler scheduled.  Not on potassium with furosemide.. because borderline hyperkalemia!  Cr bumped to 2 on daily lasix.Marland Kitchen but stable at 2.09 2/29 at nephrology on every other day lasix.  Also multifactorial anemia... Hg dropped from 11.1  to 10  here.. but 11.4 at nephrology ... low transfderrin and tot iron, but nml ferritin.  Started on  iron supplement OTC.  No GI bleeding.   CKD: Seen by Dr. Thedore Mins... stable creatinine, GFR 20.. plans to continue following at this point.   Follow up in 3 month.   Problems Prior to Update: 1)  Edema  (ICD-782.3) 2)  Constipation  (ICD-564.00) 3)  Dyspnea On Exertion  (ICD-786.09) 4)  Hyperkalemia  (ICD-276.7) 5)  Encounter For Long-term Use of Other Medications  (ICD-V58.69) 6)  Diastolic Heart Failure, Chronic  (ICD-428.32) 7)  Carotid Bruit, Right  (ICD-785.9) 8)  Incontinence, Urge  (ICD-788.31) 9)  Other Screening Mammogram  (ICD-V76.12) 10)  Special Screening For Osteoporosis  (ICD-V82.81) 11)  Special Screening Malig Neoplasms Other Sites  (ICD-V76.49) 12)  Peripheral Neuropathy, Idiopathic   (ICD-356.9) 13)  Dysuria, Chronic  (ICD-788.1) 14)  Stress Incontinence  (ICD-788.39) 15)  Venous Insufficiency  (ICD-459.81) 16)  Degeneration, Cervical Disc  (ICD-722.4) 17)  Retinal Vein Occlusion, Blind in Right Eye  (ICD-362.30) 18)  Peripheral Vascular Disease  (ICD-443.9) 19)  Osteoarthritis  (ICD-715.90) 20)  Gerd  (ICD-530.81) 21)  Diverticulosis, Colon  (ICD-562.10) 22)  COPD  (ICD-496) 23)  Allergic Rhinitis  (ICD-477.9) 24)  Renal Stone  (ICD-592.9) 25)  Hypercholesterolemia, Pure  (ICD-272.0) 26)  Cardiac Murmur  (ICD-785.2) 27)  Kidney Disease, Chronic, Stage Iii  (ICD-585.3) 28)  Essential Hypertension  (ICD-401.9)  Current Medications (verified): 1)  Vitamin E 1000 Unit Caps (Vitamin E) .... Take 1 Capsule By Mouth Once A Day 2)  Aspirin 81 Mg Tbec (Aspirin) .... Take 1 Tablet By Mouth Once A Day 3)  Tylenol Pm Extra Strength  Tabs (Diphenhydramine-Apap (Sleep) Tabs) .... Take 1 Tab By Mouth At Bedtime 4)  Bystolic 10 Mg Tabs (Nebivolol Hcl) .... Take 1 Tablet By Mouth Once A Day 5)  Gabapentin 600 Mg Tabs (Gabapentin) .Marland Kitchen.. 1 Tab By Mouth At Bedtime 6)  Enablex 15 Mg Xr24h-Tab (Darifenacin Hydrobromide) .Marland Kitchen.. 1 Tab By Mouth Daily 7)  Hydralazine Hcl 50 Mg Tabs (Hydralazine Hcl) .... Take One Tablet By Mouth Three Times A Day 8)  Proventil Hfa 108 (90 Base) Mcg/act Aers (Albuterol Sulfate) .... 2 Puffs Every 4 Hours  9)  Ceftin 250 Mg Tabs (Cefuroxime Axetil) .... One Tablet 2 Times Daily 10)  Norvasc 5 Mg Tabs (Amlodipine Besylate) .... One Tablet Daily 11)  Spiriva Handihaler 18 Mcg Caps (Tiotropium Bromide Monohydrate) .Marland Kitchen.. 1 Inhalation Daily 12)  Fluticasone Propionate 50 Mcg/act Susp (Fluticasone Propionate) .... 2 Sprays Per Nostril Before Bed. 13)  Furosemide 20 Mg Tabs (Furosemide) .Marland Kitchen.. 1 Tablet Every Other Day  Allergies: 1)  ! Penicillin V Potassium (Penicillin V Potassium) 2)  ! Levaquin (Levofloxacin)  Past History:  Past medical, surgical, family and  social histories (including risk factors) reviewed, and no changes noted (except as noted below).  Past Medical History: Reviewed history from 03/02/2010 and no changes required. 1. Allergic rhinitis 2. COPD 3. Diverticulosis, colon 4. GERD 5. Hyperlipidemia 6. Hypertension: Lower extremity swelling with amlodipine, intolerant of ACEIs 7. Osteoarthritis 8. CKD 9. Impaired vision right eye, was told this was due to HTN 10.  Normal cath in 1999 11.  LBBB 12.  Diastolic CHF: Echo (4/11) with EF 60-65%, mild LVH, mild aortic insufficiency, mild MR, PA systolic pressure 37-41 mmHg, mild to moderate TR.   Past Surgical History: Reviewed history from 05/05/2007 and no changes required. 2003      Hospitalized colitis, pseudomembranous - C.Diff 1970      Total hysterectomy - suspicious cells 1980      Cholecystectomy 1999      Stress test - ischemia 1999      Cath - nml. 2000      PFT's 2005      C-spine - cerv. disc  dz.  C5-C7, facet arthritis 2004      Abd. CT (-) 04/2005   ABI nml 05/2006   CXR, COPD, osteopenia 11/2006   DXA nml. T + 1.32  Family History: Reviewed history from 05/05/2007 and no changes required. Father: Died 63, dementia Mother: Died 76, complete heart block Siblings: 6 brothers, lung CA, sinus CA 2 sisters, one with Colon CA, one with dementia CV:  (+) HBP:  (+) DM:  Brother Colon CA:  Sister  Social History: Reviewed history from 01/26/2010 and no changes required. Former Smoker: > 1 ppd, quit 1986 Alcohol use-yes, light Drug use-no Lives alone Children: 3 daughters, 1 RN Occupation: Retired, Therapist, nutritional Exercise:  Golf Diet:  3 meals a day  Review of Systems General:  Denies fatigue and fever. CV:  Denies chest pain or discomfort. Resp:  Denies shortness of breath. GI:  Denies abdominal pain. GU:  Denies dysuria.  Physical Exam  General:  Well appearing female iN NAD  Nose:  External nasal examination shows no deformity or  inflammation. Nasal mucosa are pink and moist without lesions or exudates. Mouth:  MMM Neck:  no carotid bruit or thyromegaly no cervical or supraclavicular lymphadenopathy  Lungs:  faint crackles at bases of lungs B, no wheeze, no rhonchi, good air movement throughout Heart:  Normal rate and regular rhythm. S1 and S2 normal without gallop, murmur, click, rub or other extra sounds. Abdomen:  Bowel sounds positive,abdomen soft and non-tender without masses, organomegaly or hernias noted. Pulses:  R and L posterior tibial pulses are full and equal bilaterally  Extremities:  right 1 plus swelling, left no swelling.. wearing compression hose.  Skin:  Intact without suspicious lesions or rashes   Impression & Recommendations:  Problem # 1:  EDEMA (ICD-782.3) Improved on lasix. Hold potassium given borderline K. Labs to be checked again at renal in 3 months. Her updated medication list for  this problem includes:    Furosemide 20 Mg Tabs (Furosemide) .Marland Kitchen... 1 tablet every other day  Problem # 2:  DYSPNEA ON EXERTION (ICD-786.09) resolved with diuresis.  Her updated medication list for this problem includes:    Bystolic 10 Mg Tabs (Nebivolol hcl) .Marland Kitchen... Take 1 tablet by mouth once a day    Proventil Hfa 108 (90 Base) Mcg/act Aers (Albuterol sulfate) .Marland Kitchen... 2 puffs every 4 hours    Spiriva Handihaler 18 Mcg Caps (Tiotropium bromide monohydrate) .Marland Kitchen... 1 inhalation daily    Furosemide 20 Mg Tabs (Furosemide) .Marland Kitchen... 1 tablet every other day  Problem # 3:  ANEMIA OF RENAL FAILURE (ICD-285.21) With mild ow total iron.Ferritin nml. Continue iron supplement.   Problem # 4:  DIASTOLIC HEART FAILURE, CHRONIC (ICD-428.32) Further eval per Dr. Mariah Milling. ECHO pending.  Her updated medication list for this problem includes:    Aspirin 81 Mg Tbec (Aspirin) .Marland Kitchen... Take 1 tablet by mouth once a day    Bystolic 10 Mg Tabs (Nebivolol hcl) .Marland Kitchen... Take 1 tablet by mouth once a day    Furosemide 20 Mg Tabs (Furosemide)  .Marland Kitchen... 1 tablet every other day  Problem # 5:  KIDNEY DISEASE, CHRONIC, STAGE III (ICD-585.3) Followed by Dr. Thedore Mins.  Problem # 6:  ESSENTIAL HYPERTENSION (ICD-401.9) Well controlled. Continue current medication.  Her updated medication list for this problem includes:    Bystolic 10 Mg Tabs (Nebivolol hcl) .Marland Kitchen... Take 1 tablet by mouth once a day    Hydralazine Hcl 50 Mg Tabs (Hydralazine hcl) .Marland Kitchen... Take one tablet by mouth three times a day    Norvasc 5 Mg Tabs (Amlodipine besylate) ..... One tablet daily    Furosemide 20 Mg Tabs (Furosemide) .Marland Kitchen... 1 tablet every other day  Complete Medication List: 1)  Vitamin E 1000 Unit Caps (Vitamin e) .... Take 1 capsule by mouth once a day 2)  Aspirin 81 Mg Tbec (Aspirin) .... Take 1 tablet by mouth once a day 3)  Tylenol Pm Extra Strength Tabs (Diphenhydramine-apap (sleep) tabs) .... Take 1 tab by mouth at bedtime 4)  Bystolic 10 Mg Tabs (Nebivolol hcl) .... Take 1 tablet by mouth once a day 5)  Gabapentin 600 Mg Tabs (Gabapentin) .Marland Kitchen.. 1 tab by mouth at bedtime 6)  Enablex 15 Mg Xr24h-tab (Darifenacin hydrobromide) .Marland Kitchen.. 1 tab by mouth daily 7)  Hydralazine Hcl 50 Mg Tabs (Hydralazine hcl) .... Take one tablet by mouth three times a day 8)  Proventil Hfa 108 (90 Base) Mcg/act Aers (Albuterol sulfate) .... 2 puffs every 4 hours 9)  Ceftin 250 Mg Tabs (Cefuroxime axetil) .... One tablet 2 times daily 10)  Norvasc 5 Mg Tabs (Amlodipine besylate) .... One tablet daily 11)  Spiriva Handihaler 18 Mcg Caps (Tiotropium bromide monohydrate) .Marland Kitchen.. 1 inhalation daily 12)  Fluticasone Propionate 50 Mcg/act Susp (Fluticasone propionate) .... 2 sprays per nostril before bed. 13)  Furosemide 20 Mg Tabs (Furosemide) .Marland Kitchen.. 1 tablet every other day  Patient Instructions: 1)  Please schedule a follow-up appointment in 3 months 30 min. 2)   Return sooner if swelling, SOB, or other issues.  Prescriptions: FUROSEMIDE 20 MG TABS (FUROSEMIDE) 1 tablet every other day  #45  x 3   Entered and Authorized by:   Kerby Nora MD   Signed by:   Kerby Nora MD on 12/25/2010   Method used:   Electronically to        [facility]* (mail-order)       [address]  [address]       [place], [place]  [address]       Ph: [phone]       Fax: [phone]   RxID:   [id number]    Orders Added: 1)  Est. Patient Level IV [53664]    Current Allergies (reviewed today): ! PENICILLIN V POTASSIUM (PENICILLIN V POTASSIUM) ! LEVAQUIN (LEVOFLOXACIN)  Appended Document: Orders Update     Clinical Lists Changes  Orders: Added new Service order of Prescription Created Electronically [phone]) - Signed

## 2011-01-02 NOTE — Miscellaneous (Signed)
   Clinical Lists Changes  Observations: Added new observation of BG RANDOM: 121 mg/dL (16/07/9603 5:40) Added new observation of CREATININE: 2.04 mg/dL (98/08/9146 8:29) Added new observation of BUN: 47 mg/dL (56/21/3086 5:78) Added new observation of CHLORIDE: 103 mmol/L (12/13/2010 9:38) Added new observation of POTASSIUM: 4.2 mmol/L (12/13/2010 9:38) Added new observation of SODIUM: 140 mmol/L (12/13/2010 9:38) Added new observation of PLATELETK/UL: 248 K/uL (12/13/2010 9:38) Added new observation of RDW: 13.8 % (12/13/2010 9:38) Added new observation of MCV: 91 fL (12/13/2010 9:38) Added new observation of HCT: 36.5 % (12/13/2010 9:38) Added new observation of HGB: 11.4 g/dL (46/96/2952 8:41) Added new observation of RBC M/UL: 4.00 M/uL (12/13/2010 9:38) Added new observation of WBC COUNT: 8.4 10*3/microliter (12/13/2010 9:38)

## 2011-01-11 NOTE — Assessment & Plan Note (Signed)
Summary: ROV/AMD  Medications Added * IRON 1 tablet daily (unsure of strength)      Allergies Added:   Visit Type:  Follow-up Primary Provider:  Kerby Nora MD  CC:  "doing well" denies chest pain and SOB.Marland Kitchen  History of Present Illness: 77 yo with history of difficult to control HTN, COPD, hyperlipidemia, left bundle-branch block, presents today for routine followup.   on her last clinic visit, she had significant edema though had improvement on Lasix daily. we change her Lasix to every other day given 7 pound weight loss. Since that time, she has done well on this regimen with stable weight, no significant edema. Overall she feels well  she continues to have some difficulty walking. She does not want to use a cane and her daughter reports that she is too "proud".  Echo was suggestive of diastolic dysfunction with normal EF (60-65%).     EKG shows normal sinus rhythm with left bundle branch block, rate 74 beats per minute  Current Medications (verified): 1)  Vitamin E 1000 Unit Caps (Vitamin E) .... Take 1 Capsule By Mouth Once A Day 2)  Aspirin 81 Mg Tbec (Aspirin) .... Take 1 Tablet By Mouth Once A Day 3)  Tylenol Pm Extra Strength  Tabs (Diphenhydramine-Apap (Sleep) Tabs) .... Take 1 Tab By Mouth At Bedtime 4)  Bystolic 10 Mg Tabs (Nebivolol Hcl) .... Take 1 Tablet By Mouth Once A Day 5)  Gabapentin 600 Mg Tabs (Gabapentin) .Marland Kitchen.. 1 Tab By Mouth At Bedtime 6)  Enablex 15 Mg Xr24h-Tab (Darifenacin Hydrobromide) .Marland Kitchen.. 1 Tab By Mouth Daily 7)  Hydralazine Hcl 50 Mg Tabs (Hydralazine Hcl) .... Take One Tablet By Mouth Three Times A Day 8)  Proventil Hfa 108 (90 Base) Mcg/act Aers (Albuterol Sulfate) .... 2 Puffs Every 4 Hours 9)  Ceftin 250 Mg Tabs (Cefuroxime Axetil) .... One Tablet 2 Times Daily 10)  Norvasc 5 Mg Tabs (Amlodipine Besylate) .... One Tablet Daily 11)  Spiriva Handihaler 18 Mcg Caps (Tiotropium Bromide Monohydrate) .Marland Kitchen.. 1 Inhalation Daily 12)  Fluticasone Propionate 50  Mcg/act Susp (Fluticasone Propionate) .... 2 Sprays Per Nostril Before Bed. 13)  Furosemide 20 Mg Tabs (Furosemide) .Marland Kitchen.. 1 Tablet Every Other Day 14)  Iron .Marland Kitchen.. 1 Tablet Daily (Unsure of Strength)  Allergies (verified): 1)  ! Penicillin V Potassium (Penicillin V Potassium) 2)  ! Levaquin (Levofloxacin)  Past History:  Past Medical History: Last updated: 03/02/2010 1. Allergic rhinitis 2. COPD 3. Diverticulosis, colon 4. GERD 5. Hyperlipidemia 6. Hypertension: Lower extremity swelling with amlodipine, intolerant of ACEIs 7. Osteoarthritis 8. CKD 9. Impaired vision right eye, was told this was due to HTN 10.  Normal cath in 1999 11.  LBBB 12.  Diastolic CHF: Echo (4/11) with EF 60-65%, mild LVH, mild aortic insufficiency, mild MR, PA systolic pressure 37-41 mmHg, mild to moderate TR.   Past Surgical History: Last updated: 05/05/2007 2003      Hospitalized colitis, pseudomembranous - C.Diff 1970      Total hysterectomy - suspicious cells 1980      Cholecystectomy 1999      Stress test - ischemia 1999      Cath - nml. 2000      PFT's 2005      C-spine - cerv. disc  dz.  C5-C7, facet arthritis 2004      Abd. CT (-) 04/2005   ABI nml 05/2006   CXR, COPD, osteopenia 11/2006   DXA nml. T + 1.32  Family History: Last updated:  05/05/2007 Father: Died 19, dementia Mother: Died 42, complete heart block Siblings: 6 brothers, lung CA, sinus CA 2 sisters, one with Colon CA, one with dementia CV:  (+) HBP:  (+) DM:  Brother Colon CA:  Sister  Social History: Last updated: 01/26/2010 Former Smoker: > 1 ppd, quit 1986 Alcohol use-yes, light Drug use-no Lives alone Children: 3 daughters, 1 RN Occupation: Retired, Therapist, nutritional Exercise:  Golf Diet:  3 meals a day  Risk Factors: Smoking Status: quit (05/05/2007)  Review of Systems       The patient complains of difficulty walking.  The patient denies fever, weight loss, weight gain, vision loss, decreased hearing,  hoarseness, chest pain, syncope, dyspnea on exertion, peripheral edema, prolonged cough, abdominal pain, incontinence, muscle weakness, depression, and enlarged lymph nodes.    Vital Signs:  Patient profile:   77 year old female Height:      62.25 inches Weight:      133 pounds BMI:     24.22 Pulse rate:   59 / minute BP sitting:   126 / 69  (left arm) Cuff size:   regular  Vitals Entered By: Lysbeth Galas CMA (December 28, 2010 10:37 AM)   Physical Exam  General:  Well appearing female iN NAD  Head:  normocephalic and atraumatic Neck:  Neck supple, no JVD. No masses, thyromegaly or abnormal cervical nodes. Lungs:  Clear bilaterally to auscultation and percussion. Heart:  Non-displaced PMI, chest non-tender; regular rate and rhythm, S1, S2 without murmurs, rubs or gallops. Carotid upstroke normal, no bruit.  Pedals normal pulses. Trace LE edema, no varicosities. Abdomen:  Bowel sounds positive; abdomen soft and non-tender without masses Msk:  Back normal, normal gait. Muscle strength and tone normal. Pulses:  pulses normal in all 4 extremities Extremities:  No clubbing or cyanosis. Neurologic:  Alert and oriented x 3. Skin:  Intact without lesions or rashes. Psych:  Normal affect.   Impression & Recommendations:  Problem # 1:  EDEMA (ICD-782.3) edema is significantly improved. We'll continue Lasix every other day.  Problem # 2:  DYSPNEA ON EXERTION (ICD-786.09) previous shortness of breath was secondary to diastolic CHF. Improved after diuresis. Would continue her current medication regimen.  Her updated medication list for this problem includes:    Aspirin 81 Mg Tbec (Aspirin) .Marland Kitchen... Take 1 tablet by mouth once a day    Bystolic 10 Mg Tabs (Nebivolol hcl) .Marland Kitchen... Take 1 tablet by mouth once a day    Norvasc 5 Mg Tabs (Amlodipine besylate) ..... One tablet daily    Furosemide 20 Mg Tabs (Furosemide) .Marland Kitchen... 1 tablet every other day  Problem # 3:  DIASTOLIC HEART FAILURE, CHRONIC  (ICD-428.32) Normal systolic function. No further workup needed at this time. Will need periodic dilation of her kidney function.  Her updated medication list for this problem includes:    Aspirin 81 Mg Tbec (Aspirin) .Marland Kitchen... Take 1 tablet by mouth once a day    Bystolic 10 Mg Tabs (Nebivolol hcl) .Marland Kitchen... Take 1 tablet by mouth once a day    Norvasc 5 Mg Tabs (Amlodipine besylate) ..... One tablet daily    Furosemide 20 Mg Tabs (Furosemide) .Marland Kitchen... 1 tablet every other day  Problem # 4:  RENAL INSUFFICIENCY, CHRONIC (ICD-585.9) Creatinine has been stable in the low 2 range.

## 2011-01-24 ENCOUNTER — Other Ambulatory Visit: Payer: Self-pay | Admitting: *Deleted

## 2011-01-24 MED ORDER — GABAPENTIN 600 MG PO TABS: 600.0000 mg | ORAL_TABLET | Freq: Every day | ORAL | Status: DC

## 2011-01-24 NOTE — Telephone Encounter (Signed)
Please help me by calling in to pharmacy as written - I e-scribed it somehow incorrectly

## 2011-01-24 NOTE — Telephone Encounter (Signed)
done

## 2011-02-20 ENCOUNTER — Ambulatory Visit: Payer: Self-pay | Admitting: Otolaryngology

## 2011-03-02 NOTE — Assessment & Plan Note (Signed)
Elizabeth Silva                             Elizabeth Silva   Elizabeth Silva, Elizabeth Silva                         MRN:          [id number]  DATE:07/04/2006                            DOB:          Elizabeth Silva, Elizabeth Silva    CHIEF COMPLAINT:  An 77 year old white female here to establish new doctor.   HISTORY OF PRESENT ILLNESS:  Elizabeth Silva states that she is changing to a new  doctor from Elizabeth Silva.  She had been seeing him for the previous year.  Prior to that she was seen at [facility] in [place].  She comes to  clinic today with the following concerns:  (1) Central lower abdominal pain:  She states that the sensation she has been feeling for approximately 2-3  months is not really a pain, it is more of a warmth and a burning that she  feels is in her bladder.  She denies any rash, fever, chills.  She denies  any dysuria.  She states it happens intermittently and is not constant.  She  has also been noticing some increase in her foam in her urine about a month  ago which is now decreasing.  She states that she also saw Elizabeth Silva  around May 16, 2006 because of blood in her urine.  He diagnosed her with  urinary tract infection and she was given Cipro for 10 days.  When she  returned for a repeat urinalysis the blood was gone.  She states that she  has had some issues with stress incontinence although it does not bother her  that much.  She denies any urinary urge, increase in frequency, lower  abdominal cramping.  She denies symptoms of urge incontinence.  She has had  several urinary tract infections approximately four in the last year.  She  states she received medicines like Cipro for treatment of these.  She would  like to have her urinary symptoms looked into further.  (2) Chronic renal  insufficiency:  It appears in her records that she brings with her that her  kidney function has gradually been worsening with age.  There appears to be  no  acute change given the fact that it was 1.4 in 2004 and when checked in  August 2007 it was 1.5.  She is concerned about the foam in her urine for  the last month and wants to make sure her kidney function is adequate.  (3)  Hypercholesterolemia:  This was evaluated by her previous doctor in August  2007.  Her LDL was not at goal at that time at 159.  HDL and triglycerides  were at goal.  She was changed from a different unknown medication to  Vytorin 10/40 mg daily.  She has not had a cholesterol recheck to determine  if this has improved her cholesterol.  (4) Hypertension, well controlled:  She takes triamterene/hydrochlorothiazide as well as verapamil daily.  She  denies any chest pain or side effects from these medications.   REVIEW OF SYSTEMS:  Otherwise negative.   PAST MEDICAL HISTORY:  1. Hypertension.  2. Hypercholesterolemia.  3. Osteoarthritis.  4. 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  5. COPD.  6. Cervical degenerative disease.  7. Chronic renal insufficiency.  8. Diverticulosis.   HOSPITALIZATIONS/SURGERIES AND PROCEDURES:  1. In 2003, hospitalized for pseudomembranous colitis caused by C.      difficile.  2. In 1970, total hysterectomy for possibly suspicious cells.  3. In 1980, cholecystectomy.  4. In 1999, stress test showed ischemia.  5. In 1999, cath following stress test was within normal limits.  6. In 2000, PFTs showed COPD.  7. In 2005, C-spine cervical disk disease C5-C7 and facet arthritis.  8. In 2004, abdominal CT negative.  9. Mammogram in August 2007 negative.  10.In 1999, she had a barium enema that was normal, she has had Hemoccult      screening since then that has showed no evidence of bleeding.   ALLERGIES:  PENICILLIN AND LEVAQUIN.   MEDICATIONS:  1. Multivitamin daily.  2. Vitamin E 1000 units daily.  3. Aspirin 81 mg daily.  4. Triamterene/hydrochlorothiazide 37.5/25 mg daily.  5. Celebrex 200 mg daily.  6. Vytorin  10/40 mg daily.  7. Verelan PM (verapamil) 100 mg daily.  8. Tylenol PM q.h.s.   FAMILY HISTORY:  Father deceased at age 63 with dementia.  Mother deceased  at age 55 with complete heart block.  There is no family history of MI  before age 40.  She has six brothers and two sisters.  One of her brothers  has lung cancer, another had sinus cancer.  She has one sister with colon  cancer and one sister with dementia.  There is one brother who also has  diabetes and several of them have high blood pressure and coronary artery  disease.   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  She has been married for 64 years.  She has three daughters, one of whom is  a Engineer, civil (consulting).  They are all healthy.  She does not get too much regular exercise  but does like to play golf frequently.  She eats three meals a day and tries  to get a lot of fruits and vegetables and water.   PHYSICAL EXAMINATION:  VITAL SIGNS:  Height 62-1/4 inches, weight 129, blood  pressure 118/66, pulse 76, temperature 98.5.  GENERAL:  Elderly-appearing female in no apparent distress.  HEENT:  PERRLA.  Extraocular muscles intact.  Oropharynx clear.  Tympanic  membranes are clear.  Nares clear.  No thyromegaly.  No lymphadenopathy,  cervical or supraclavicular.  CARDIOVASCULAR:  Regular rate and rhythm.  No murmurs, rubs or gallops.  Normal PMI.  2+ peripheral pulses.  LUNGS:  Clear to auscultation bilaterally.  No wheezes, rales or rhonchi.  EXTREMITIES:  No clubbing.  No cyanosis.  MUSCULOSKELETAL:  Strength 5/5 in upper and lower extremities.  NEUROLOGICAL:  Alert and oriented x3.  Cranial nerves II-XII grossly intact.  ABDOMEN:  Soft, nontender, normoactive bowel sounds.  No hepatosplenomegaly.  No rebound.  No guarding.  SKIN:  Several areas of bruising on arms, delicate skin.  No suspicious  lesions in easily visible skin.   ASSESSMENT AND PLAN: 1. Central lower abdominal pain or bladder  irritation:  Urinalysis today      is within normal limits showing no sign of inflammation or infection.      In addition, there is no protein seen.  Given her concern about her      frequent urinary  tract infections and the sensation, we will look into      this issue a little further.  I will have a check a 24-hour urine      collection for protein.  This will also help [place] evaluate her chronic      renal insufficiency.  I will also check a urine sodium and urine      creatinine.  Her symptoms do not sound like urge incontinence but Elizabeth      be some bladder irritation.  At this point in time since she is not      having urinary frequency I will not start any medications.  She Elizabeth      need a referral to urologist for further studies.  2. Chronic renal insufficiency, stable:  She has only had a mild increase      in her creatinine from 2006 to 2007 from 1.4 to 1.5.  Hemoglobin shows      no evidence of anemia.  Her phosphorus levels are normal.  The only      other thing that needs to be evaluated in the future is intact      parathyroid hormone for secondary hyperparathyroidemia.  3. Hypercholesterolemia, poor control:  She is currently on Vytorin 10/40      mg daily.  She will continue on this for the next 3 months and then      will return for a cholesterol panel.  4. Hypertension, well controlled:  She will continue on      triamterene/hydrochlorothiazide 37.5/25 mg daily as well as Verelan PM      100 mg p.o. daily.  5. Prevention:  It appears she is up-to-date with prevention at this point      in time.  I did neglect to ask about Pneumovax so this can be discussed      at her next visit.  I will review her records from [facility] and      will try and obtain full records from Elizabeth Silva.                                   Elizabeth Nora, MD   AB/MedQ  DD:  07/04/2006  DT:  07/05/2006  Job #:  [id number]

## 2011-03-20 ENCOUNTER — Other Ambulatory Visit: Payer: Self-pay | Admitting: *Deleted

## 2011-03-20 MED ORDER — HYDRALAZINE HCL 50 MG PO TABS: 50.0000 mg | ORAL_TABLET | Freq: Three times a day (TID) | ORAL | Status: DC

## 2011-03-26 ENCOUNTER — Ambulatory Visit (INDEPENDENT_AMBULATORY_CARE_PROVIDER_SITE_OTHER): Payer: Medicare Other | Admitting: Family Medicine

## 2011-03-26 ENCOUNTER — Encounter: Payer: Self-pay | Admitting: Family Medicine

## 2011-03-26 DIAGNOSIS — I509 Heart failure, unspecified: Secondary | ICD-10-CM

## 2011-03-26 DIAGNOSIS — I5032 Chronic diastolic (congestive) heart failure: Secondary | ICD-10-CM

## 2011-03-26 DIAGNOSIS — K219 Gastro-esophageal reflux disease without esophagitis: Secondary | ICD-10-CM

## 2011-03-26 DIAGNOSIS — I10 Essential (primary) hypertension: Secondary | ICD-10-CM

## 2011-03-26 NOTE — Assessment & Plan Note (Signed)
Per ENT causing mucus in throat.  Continue omeprazole for at least 4-6 weeks total .. May need to try stronger med to treat.

## 2011-03-26 NOTE — Progress Notes (Signed)
  Subjective:    Patient ID: Elizabeth Silva, female    DOB: 1918-06-01, 77 y.o.   MRN: [id number]  HPI 77 yo with history of difficult to control HTN, COPD, hyperlipidemia, left bundle-branch block, presents today for routine followup.   Hypertension:  Well controlled  Using medication without problems or lightheadedness:  Chest pain with exertion: None Edema: Stable Short of breath:None Average home BPs: occasionally 135/70-80 average Other issues: Has noted leg cramps for years, worse in last month.  Recent 12/2010 appt with Dr. Mariah Milling cardiologist. Echo was suggestive of diastolic dysfunction with normal EF (60-65%).  EKG showed normal sinus rhythm with left bundle branch block, rate 74 beats per minute  Back on lasix every other day.. euvolemic  CKD: Seen by Dr. Thedore Mins... stable creatinine, GFR 20.. plans to continue following at this point.   Saw ENT 02/2011 for mucus in throat, lips chapped. Diagnosed with reflux. Started omeprazole 03/04/11. Has follow up later in month. CXR was normal.    Review of Systems  Constitutional: Negative for fever and fatigue.  HENT: Negative for ear pain.   Eyes: Negative for pain.  Respiratory: Negative for chest tightness and shortness of breath.   Cardiovascular: Negative for chest pain, palpitations and leg swelling.  Gastrointestinal: Negative for abdominal pain.  Genitourinary: Negative for dysuria.       Objective:   Physical Exam  Constitutional: Vital signs are normal. She appears well-developed and well-nourished. She is cooperative.  Non-toxic appearance. She does not appear ill. No distress.       Elderly female in NAD  HENT:  Head: Normocephalic.  Right Ear: Hearing, tympanic membrane, external ear and ear canal normal. Tympanic membrane is not erythematous, not retracted and not bulging.  Left Ear: Hearing, tympanic membrane, external ear and ear canal normal. Tympanic membrane is not erythematous, not retracted and not  bulging.  Nose: No mucosal edema or rhinorrhea. Right sinus exhibits no maxillary sinus tenderness and no frontal sinus tenderness. Left sinus exhibits no maxillary sinus tenderness and no frontal sinus tenderness.  Mouth/Throat: Uvula is midline, oropharynx is clear and moist and mucous membranes are normal.  Eyes: Conjunctivae, EOM and lids are normal. Pupils are equal, round, and reactive to light. No foreign bodies found.  Neck: Trachea normal and normal range of motion. Neck supple. Carotid bruit is not present. No mass and no thyromegaly present.  Cardiovascular: Normal rate, regular rhythm, S1 normal, S2 normal, normal heart sounds, intact distal pulses and normal pulses.  Exam reveals no gallop and no friction rub.   No murmur heard. Pulmonary/Chest: Effort normal and breath sounds normal. Not tachypneic. No respiratory distress. She has no decreased breath sounds. She has no wheezes. She has no rhonchi. She has no rales.  Abdominal: Soft. Normal appearance and bowel sounds are normal. There is no tenderness.  Neurological: She is alert.  Skin: Skin is warm, dry and intact. No rash noted.  Psychiatric: Her speech is normal and behavior is normal. Judgment and thought content normal. Her mood appears not anxious. Cognition and memory are normal. She does not exhibit a depressed mood.          Assessment & Plan:

## 2011-03-26 NOTE — Assessment & Plan Note (Signed)
Well controlled. Continue current medication.  

## 2011-03-26 NOTE — Assessment & Plan Note (Signed)
Euvolemic. 

## 2011-03-26 NOTE — Patient Instructions (Addendum)
Have potassium checked with Dr. Thedore Mins at upcoming labs. Can try 1 tsp yellow French's mustard for cramps in legs.  Call if any periphearl swelling, sudden weight gain or Shortness of breath.

## 2011-04-05 ENCOUNTER — Other Ambulatory Visit: Payer: Self-pay | Admitting: *Deleted

## 2011-04-05 MED ORDER — DARIFENACIN HYDROBROMIDE ER 15 MG PO TB24: 15.0000 mg | ORAL_TABLET | Freq: Every day | ORAL | Status: DC

## 2011-04-25 ENCOUNTER — Other Ambulatory Visit: Payer: Self-pay | Admitting: *Deleted

## 2011-04-25 MED ORDER — NEBIVOLOL HCL 10 MG PO TABS: 10.0000 mg | ORAL_TABLET | Freq: Every day | ORAL | Status: DC

## 2011-04-26 ENCOUNTER — Telehealth: Payer: Self-pay | Admitting: *Deleted

## 2011-04-26 NOTE — Telephone Encounter (Signed)
Patient says that Dr. Thedore Mins was to fax over a copy of her labs. She is asking if you got it and if so if every thing looked okay.

## 2011-04-27 ENCOUNTER — Other Ambulatory Visit: Payer: Self-pay | Admitting: Unknown Physician Specialty

## 2011-04-27 NOTE — Telephone Encounter (Signed)
Spoke with [facility] and they are faxing labs over to our office

## 2011-04-27 NOTE — Telephone Encounter (Signed)
Have not gotten labs yet. Contact Dr. Thedore Mins to get a copy please.

## 2011-06-20 ENCOUNTER — Ambulatory Visit (INDEPENDENT_AMBULATORY_CARE_PROVIDER_SITE_OTHER): Payer: Medicare Other | Admitting: Family Medicine

## 2011-06-20 ENCOUNTER — Encounter: Payer: Self-pay | Admitting: Family Medicine

## 2011-06-20 DIAGNOSIS — I5032 Chronic diastolic (congestive) heart failure: Secondary | ICD-10-CM

## 2011-06-20 DIAGNOSIS — I10 Essential (primary) hypertension: Secondary | ICD-10-CM

## 2011-06-20 DIAGNOSIS — I509 Heart failure, unspecified: Secondary | ICD-10-CM

## 2011-06-20 DIAGNOSIS — I872 Venous insufficiency (chronic) (peripheral): Secondary | ICD-10-CM

## 2011-06-20 DIAGNOSIS — E7800 Pure hypercholesterolemia, unspecified: Secondary | ICD-10-CM

## 2011-06-20 NOTE — Patient Instructions (Addendum)
Okay to take 2000 mg divided daily of fish oil. Okay to restart multivitamin given potassium no longer high and low end of normal.  Discuss with Dr. Thedore Mins if he is comfortable with [place] following labs here.

## 2011-06-20 NOTE — Assessment & Plan Note (Signed)
Stable, she is interested in having this followed here. I advised her to check with Dr. Thedore Mins, to determine if this is all right, and for him to let us know if there are any additional labs he would follow.

## 2011-06-20 NOTE — Assessment & Plan Note (Signed)
Stable wearing compression hose.

## 2011-06-20 NOTE — Progress Notes (Signed)
  Subjective:    Patient ID: Elizabeth Silva, female    DOB: 1917/12/14, 77 y.o.   MRN: [id number]  HPI  77 year old female presents for follow up.   Hypertension: Well controlled  Using medication without problems or lightheadedness:  Chest pain with exertion: None, she does feel tired out with walking a long way Edema: Stable, wearing compression hose on legs. Short of breath:None  Average home BPs: occasionally 135/70-80 average     Recent 12/2010 appt with Dr. Mariah Milling cardiologist.  Has follow up this month. Echo was suggestive of diastolic dysfunction with normal EF (60-65%).  EKG showed normal sinus rhythm with left bundle branch block, rate 74 beats per minute  Back on lasix every other day.. euvolemic .Marland Kitchen Weight stable today.  CKD: Seen by Dr. Thedore Mins 04/2011... stable creatinine, GFR 25.. plans to continue following at this point.   GERD, mucus in throat: improved on omeprazole  about 70 %. She has a follow up with ENT this month  Review of Systems  Constitutional: Negative for fever and fatigue.  HENT: Negative for ear pain.   Eyes: Negative for pain.  Respiratory: Negative for chest tightness and shortness of breath.   Cardiovascular: Negative for chest pain, palpitations and leg swelling.  Gastrointestinal: Negative for abdominal pain.  Genitourinary: Negative for dysuria.       Objective:   Physical Exam  Constitutional: Vital signs are normal. She appears well-developed and well-nourished. She is cooperative.  Non-toxic appearance. She does not appear ill. No distress.  HENT:  Head: Normocephalic.  Right Ear: Hearing, tympanic membrane, external ear and ear canal normal. Tympanic membrane is not erythematous, not retracted and not bulging.  Left Ear: Hearing, tympanic membrane, external ear and ear canal normal. Tympanic membrane is not erythematous, not retracted and not bulging.  Nose: No mucosal edema or rhinorrhea. Right sinus exhibits no maxillary sinus tenderness  and no frontal sinus tenderness. Left sinus exhibits no maxillary sinus tenderness and no frontal sinus tenderness.  Mouth/Throat: Uvula is midline, oropharynx is clear and moist and mucous membranes are normal.  Eyes: Conjunctivae, EOM and lids are normal. Pupils are equal, round, and reactive to light. No foreign bodies found.  Neck: Trachea normal and normal range of motion. Neck supple. Carotid bruit is not present. No mass and no thyromegaly present.  Cardiovascular: Normal rate, regular rhythm, S1 normal, S2 normal, intact distal pulses and normal pulses.  Exam reveals distant heart sounds. Exam reveals no gallop and no friction rub.   Murmur heard.  Systolic murmur is present with a grade of 1/6       Right leg slightly swollen compared to left, at baseline  Pulmonary/Chest: Effort normal and breath sounds normal. Not tachypneic. No respiratory distress. She has no decreased breath sounds. She has no wheezes. She has no rhonchi. She has no rales.  Abdominal: Soft. Normal appearance and bowel sounds are normal. There is no tenderness.  Neurological: She is alert.  Skin: Skin is warm, dry and intact. No rash noted.  Psychiatric: Her speech is normal and behavior is normal. Judgment and thought content normal. Her mood appears not anxious. Cognition and memory are normal. She does not exhibit a depressed mood.          Assessment & Plan:

## 2011-06-20 NOTE — Assessment & Plan Note (Signed)
Stable euvolemic on lasix every other day. Weights stable.

## 2011-06-20 NOTE — Assessment & Plan Note (Signed)
Eval prior to AMW.

## 2011-06-22 ENCOUNTER — Encounter: Payer: Self-pay | Admitting: Cardiovascular Disease

## 2011-06-26 ENCOUNTER — Encounter: Payer: Self-pay | Admitting: Family Medicine

## 2011-06-27 ENCOUNTER — Ambulatory Visit (INDEPENDENT_AMBULATORY_CARE_PROVIDER_SITE_OTHER): Payer: Medicare Other | Admitting: Cardiovascular Disease

## 2011-06-27 ENCOUNTER — Encounter: Payer: Self-pay | Admitting: Cardiovascular Disease

## 2011-06-27 DIAGNOSIS — R0989 Other specified symptoms and signs involving the circulatory and respiratory systems: Secondary | ICD-10-CM

## 2011-06-27 DIAGNOSIS — E7800 Pure hypercholesterolemia, unspecified: Secondary | ICD-10-CM

## 2011-06-27 DIAGNOSIS — I10 Essential (primary) hypertension: Secondary | ICD-10-CM

## 2011-06-27 DIAGNOSIS — I5032 Chronic diastolic (congestive) heart failure: Secondary | ICD-10-CM

## 2011-06-27 DIAGNOSIS — R609 Edema, unspecified: Secondary | ICD-10-CM

## 2011-06-27 DIAGNOSIS — I509 Heart failure, unspecified: Secondary | ICD-10-CM

## 2011-06-27 DIAGNOSIS — I872 Venous insufficiency (chronic) (peripheral): Secondary | ICD-10-CM

## 2011-06-27 DIAGNOSIS — I739 Peripheral vascular disease, unspecified: Secondary | ICD-10-CM

## 2011-06-27 MED ORDER — HYDRALAZINE HCL 50 MG PO TABS: 75.0000 mg | ORAL_TABLET | Freq: Three times a day (TID) | ORAL | Status: DC

## 2011-06-27 NOTE — Assessment & Plan Note (Signed)
No significant edema on today's visit. Appears to be stable on diuretic every other day. She did have moderate tricuspid valve regurgitation on echo, mildly elevated right ventricular systolic pressures previously.

## 2011-06-27 NOTE — Progress Notes (Signed)
Patient ID: Elizabeth Silva, female    DOB: 1918/03/30, 77 y.o.   MRN: [id number]  HPI Comments: 77 yo with history of difficult to control HTN, COPD, hyperlipidemia, left bundle-branch block, presents today for routine followup.     She reports that her edema has been well controlled on Lasix every other day. She has had lab work with Dr. Thedore Mins and was told that her renal function had slightly improved. Otherwise she feels well. she continues to have some difficulty walking. She does not want to use a cane and her daughter reports that she is too "proud".   Echo was suggestive of diastolic dysfunction with normal EF (60-65%).       EKG shows normal sinus rhythm with left bundle branch block, rate 60 beats per minute      Outpatient Encounter Prescriptions as of 06/27/2011  Medication Sig Dispense Refill  . acetaminophen (TYLENOL) 500 MG tablet Take 500 mg by mouth every 6 (six) hours as needed.        Marland Kitchen amLODipine (NORVASC) 5 MG tablet Take 5 mg by mouth daily.        Marland Kitchen aspirin 81 MG EC tablet Take 81 mg by mouth daily.        . fluticasone (FLONASE) 50 MCG/ACT nasal spray 2 sprays by Nasal route daily.        . furosemide (LASIX) 20 MG tablet Take 20 mg by mouth daily.        Marland Kitchen gabapentin (NEURONTIN) 600 MG tablet Take 1 tablet (600 mg total) by mouth at bedtime.  180 tablet  1  . nebivolol (BYSTOLIC) 10 MG tablet Take 1 tablet (10 mg total) by mouth daily.  90 tablet  3  . omeprazole (PRILOSEC) 40 MG capsule       . tiotropium (SPIRIVA) 18 MCG inhalation capsule Place 18 mcg into inhaler and inhale daily.        . vitamin E 1000 UNIT capsule Take 1,000 Units by mouth daily.        .  hydrALAZINE (APRESOLINE) 50 MG tablet Take 1 tablet (50 mg total) by mouth 3 (three) times daily.  270 tablet  3     Review of Systems  Constitutional: Negative.   HENT: Negative.   Eyes: Negative.   Respiratory: Negative.   Cardiovascular: Negative.   Gastrointestinal: Negative.   Musculoskeletal:  Negative.   Skin: Negative.   Neurological: Negative.   Hematological: Negative.   Psychiatric/Behavioral: Negative.   All other systems reviewed and are negative.    BP 150/65  Pulse 60  Ht 5\' 2"  (1.575 m)  Wt 137 lb (62.143 kg)  BMI 25.06 kg/m2   Physical Exam  Nursing note and vitals reviewed. Constitutional: She is oriented to person, place, and time. She appears well-developed and well-nourished.  HENT:  Head: Normocephalic.  Nose: Nose normal.  Mouth/Throat: Oropharynx is clear and moist.  Eyes: Conjunctivae are normal. Pupils are equal, round, and reactive to light.  Neck: Normal range of motion. Neck supple. No JVD present. Carotid bruit is present.  Cardiovascular: Normal rate, regular rhythm, S1 normal, S2 normal and intact distal pulses.  Exam reveals no gallop and no friction rub.   Murmur heard.  Crescendo systolic murmur is present with a grade of 2/6  Pulmonary/Chest: Effort normal and breath sounds normal. No respiratory distress. She has no wheezes. She has no rales. She exhibits no tenderness.  Abdominal: Soft. Bowel sounds are normal. She exhibits no distension. There is  no tenderness.  Musculoskeletal: Normal range of motion. She exhibits no edema and no tenderness.  Lymphadenopathy:    She has no cervical adenopathy.  Neurological: She is alert and oriented to person, place, and time. Coordination normal.  Skin: Skin is warm and dry. No rash noted. No erythema.  Psychiatric: She has a normal mood and affect. Her behavior is normal. Judgment and thought content normal.         Assessment and Plan

## 2011-06-27 NOTE — Assessment & Plan Note (Signed)
No clinical signs of diastolic CHF on today's visit. Other than her blood pressure medications, no further medication changes were made.

## 2011-06-27 NOTE — Assessment & Plan Note (Signed)
Blood pressure is elevated. We will increase the hydralazine to 75 mg t.i.d..

## 2011-06-27 NOTE — Assessment & Plan Note (Signed)
Carotid bruit on the right.  ultrasound with no significant disease noted.

## 2011-06-27 NOTE — Patient Instructions (Addendum)
You are doing well. Your physician has recommended you make the following change in your medication: INCREASE Hydralazine to 1 1/2 tablets THREE times a day (75mg )  Please call us if you have new issues that need to be addressed before your next appt.  We will call you for a follow up Appt. In 6 months

## 2011-07-25 ENCOUNTER — Ambulatory Visit (INDEPENDENT_AMBULATORY_CARE_PROVIDER_SITE_OTHER): Payer: Medicare Other

## 2011-07-25 DIAGNOSIS — Z23 Encounter for immunization: Secondary | ICD-10-CM

## 2011-07-26 ENCOUNTER — Other Ambulatory Visit: Payer: Self-pay | Admitting: *Deleted

## 2011-07-26 MED ORDER — AMLODIPINE BESYLATE 5 MG PO TABS: 5.0000 mg | ORAL_TABLET | Freq: Every day | ORAL | Status: DC

## 2011-07-27 ENCOUNTER — Ambulatory Visit: Payer: Medicare Other | Admitting: Family Medicine

## 2011-07-31 ENCOUNTER — Ambulatory Visit: Payer: Medicare Other | Admitting: Family Medicine

## 2011-08-01 ENCOUNTER — Encounter: Payer: Self-pay | Admitting: Family Medicine

## 2011-08-01 ENCOUNTER — Ambulatory Visit (INDEPENDENT_AMBULATORY_CARE_PROVIDER_SITE_OTHER): Payer: Medicare Other | Admitting: Family Medicine

## 2011-08-01 VITALS — BP 130/60 | HR 73 | Temp 98.2°F | Ht 62.0 in | Wt 138.0 lb

## 2011-08-01 DIAGNOSIS — J157 Pneumonia due to Mycoplasma pneumoniae: Secondary | ICD-10-CM

## 2011-08-01 DIAGNOSIS — J189 Pneumonia, unspecified organism: Secondary | ICD-10-CM

## 2011-08-01 MED ORDER — AZITHROMYCIN 250 MG PO TABS: ORAL_TABLET | ORAL | Status: AC

## 2011-08-01 NOTE — Progress Notes (Signed)
  Subjective:    Patient ID: Elizabeth Silva, female    DOB: August 13, 1918, 77 y.o.   MRN: [id number]  HPI  Elizabeth Silva, a 77 y.o. female presents today in the office for the following:    Pleasant elderly patient with a distant history of tobacco abuse and some underlying COPD who presents with a three-week history of a cold that has progressed to chest congestion and productive sputum. She had fever last night in the 99 range. Her coughing has gotten worse over the last few days. She does not feel acutely short of breath and denies chest pain. She has been using some Mucinex. 3 weeks ill. Chest is bothering her a lot and coughing a lot. This morning it looked dirty.   The PMH, PSH, Social History, Family History, Medications, and allergies have been reviewed in [facility], and have been updated if relevant.   Review of Systems ROS: GEN: Acute illness details above GI: Tolerating PO intake GU: maintaining adequate hydration and urination Pulm: No SOB Interactive and getting along well at home.  Otherwise, ROS is as per the HPI.     Objective:   Physical Exam   Physical Exam  Blood pressure 130/60, pulse 73, temperature 98.2 F (36.8 C), temperature source Oral, height 5\' 2"  (1.575 m), weight 138 lb (62.596 kg), SpO2 90.00%.  GEN: A and O x 3. WDWN. NAD.    ENT: Nose clear, ext NML.  No LAD.  No JVD.  TM's clear. Oropharynx clear.  PULM: Normal WOB, no distress. Diffuse rhonchi throughout without wheezes CV: RRR, no M/G/R, No rubs, No JVD.   EXT: warm and well-perfused, No c/c/e. PSYCH: Pleasant and conversant.       Assessment & Plan:   1. Walking pneumonia  azithromycin (ZITHROMAX Z-PAK) 250 MG tablet    Also, prn albuterol cont mucinex F/u if worsens, fevers

## 2011-08-01 NOTE — Patient Instructions (Signed)
Can use your albuterol rescue inhaler - 2 puffs up to every 4 hours. For breathing - if you find it helps with cough and breathing

## 2011-09-21 ENCOUNTER — Other Ambulatory Visit: Payer: Medicare Other

## 2011-09-21 ENCOUNTER — Telehealth: Payer: Self-pay | Admitting: Family Medicine

## 2011-09-21 DIAGNOSIS — N039 Chronic nephritic syndrome with unspecified morphologic changes: Secondary | ICD-10-CM

## 2011-09-21 DIAGNOSIS — I10 Essential (primary) hypertension: Secondary | ICD-10-CM

## 2011-09-21 DIAGNOSIS — E875 Hyperkalemia: Secondary | ICD-10-CM

## 2011-09-21 DIAGNOSIS — E7800 Pure hypercholesterolemia, unspecified: Secondary | ICD-10-CM

## 2011-09-21 NOTE — Telephone Encounter (Signed)
Message copied by Excell Seltzer on Fri Sep 21, 2011  8:19 AM ------      Message from: Alvina Chou      Created: Thu Sep 13, 2011  3:34 PM      Regarding: orders for 12.7       Patient is scheduled for CPX labs, please order future labs, Thanks , Camelia Eng

## 2011-09-28 ENCOUNTER — Other Ambulatory Visit (INDEPENDENT_AMBULATORY_CARE_PROVIDER_SITE_OTHER): Payer: Medicare Other

## 2011-09-28 ENCOUNTER — Encounter: Payer: Medicare Other | Admitting: Family Medicine

## 2011-09-28 DIAGNOSIS — I10 Essential (primary) hypertension: Secondary | ICD-10-CM

## 2011-09-28 DIAGNOSIS — E7800 Pure hypercholesterolemia, unspecified: Secondary | ICD-10-CM

## 2011-09-28 DIAGNOSIS — D631 Anemia in chronic kidney disease: Secondary | ICD-10-CM

## 2011-09-28 DIAGNOSIS — E875 Hyperkalemia: Secondary | ICD-10-CM

## 2011-09-28 DIAGNOSIS — N189 Chronic kidney disease, unspecified: Secondary | ICD-10-CM

## 2011-09-28 LAB — COMPREHENSIVE METABOLIC PANEL
Alkaline Phosphatase: 58 U/L (ref 39–117)
BUN: 44 mg/dL — ABNORMAL HIGH (ref 6–23)
Creatinine, Ser: 2 mg/dL — ABNORMAL HIGH (ref 0.4–1.2)
Glucose, Bld: 144 mg/dL — ABNORMAL HIGH (ref 70–99)
Total Bilirubin: 0.4 mg/dL (ref 0.3–1.2)

## 2011-09-28 LAB — CBC WITH DIFFERENTIAL/PLATELET
Basophils Relative: 0.8 % (ref 0.0–3.0)
Eosinophils Relative: 3.5 % (ref 0.0–5.0)
HCT: 34.2 % — ABNORMAL LOW (ref 36.0–46.0)
Lymphs Abs: 1.7 10*3/uL (ref 0.7–4.0)
MCV: 93.3 fl (ref 78.0–100.0)
Monocytes Absolute: 0.6 10*3/uL (ref 0.1–1.0)
Neutrophils Relative %: 57.2 % (ref 43.0–77.0)
RBC: 3.67 Mil/uL — ABNORMAL LOW (ref 3.87–5.11)
WBC: 6 10*3/uL (ref 4.5–10.5)

## 2011-09-28 LAB — LIPID PANEL
LDL Cholesterol: 108 mg/dL — ABNORMAL HIGH (ref 0–99)
Total CHOL/HDL Ratio: 4
Triglycerides: 161 mg/dL — ABNORMAL HIGH (ref 0.0–149.0)

## 2011-10-12 ENCOUNTER — Ambulatory Visit (INDEPENDENT_AMBULATORY_CARE_PROVIDER_SITE_OTHER): Payer: Medicare Other | Admitting: Family Medicine

## 2011-10-12 ENCOUNTER — Encounter: Payer: Self-pay | Admitting: Family Medicine

## 2011-10-12 DIAGNOSIS — E7800 Pure hypercholesterolemia, unspecified: Secondary | ICD-10-CM

## 2011-10-12 DIAGNOSIS — I739 Peripheral vascular disease, unspecified: Secondary | ICD-10-CM

## 2011-10-12 DIAGNOSIS — I5032 Chronic diastolic (congestive) heart failure: Secondary | ICD-10-CM

## 2011-10-12 DIAGNOSIS — M1990 Unspecified osteoarthritis, unspecified site: Secondary | ICD-10-CM

## 2011-10-12 DIAGNOSIS — Z0000 Encounter for general adult medical examination without abnormal findings: Secondary | ICD-10-CM

## 2011-10-12 DIAGNOSIS — J4489 Other specified chronic obstructive pulmonary disease: Secondary | ICD-10-CM

## 2011-10-12 DIAGNOSIS — J449 Chronic obstructive pulmonary disease, unspecified: Secondary | ICD-10-CM

## 2011-10-12 DIAGNOSIS — I10 Essential (primary) hypertension: Secondary | ICD-10-CM

## 2011-10-12 DIAGNOSIS — E875 Hyperkalemia: Secondary | ICD-10-CM

## 2011-10-12 DIAGNOSIS — N183 Chronic kidney disease, stage 3 unspecified: Secondary | ICD-10-CM

## 2011-10-12 DIAGNOSIS — Z780 Asymptomatic menopausal state: Secondary | ICD-10-CM

## 2011-10-12 NOTE — Assessment & Plan Note (Signed)
Try glucosamine for pain. Follow up for further eval if not improving.

## 2011-10-12 NOTE — Assessment & Plan Note (Signed)
Stable followed by nephrology 

## 2011-10-12 NOTE — Assessment & Plan Note (Signed)
Improved control on red yeast rice but far from goal LDL < 70. Will not be aggressive with treatment given age.

## 2011-10-12 NOTE — Assessment & Plan Note (Signed)
Euvolemic, continue lasix every other day.

## 2011-10-12 NOTE — Progress Notes (Signed)
Subjective:    Patient ID: Elizabeth Silva, female    DOB: 07-09-1918, 77 y.o.   MRN: [id number]  HPI  I have personally reviewed the Medicare Annual Wellness questionnaire and have noted 1. The patient's medical and social history 2. Their use of alcohol, tobacco or illicit drugs 3. Their current medications and supplements 4. The patient's functional ability including ADL's, fall risks, home safety risks and hearing or visual             impairment. 5. Diet and physical activities 6. Evidence for depression or mood disorders  The patients weight, height, BMI and visual acuity have been recorded in the chart I have made referrals, counseling and provided education to the patient based review of the above and I have provided the pt with a written personalized care plan for preventive services.  Has multiple aches and pains ongoing for quite a while, off and on. Both ankles off and on are painful... No swelling. No known injury. Hips hurt, knees hurts.  Using tylenol as needed. Has history of osteoarthritis. Has been on meloxicam and celebrex in past.. But taken off due to renal issues  Hypertension: Well controlled on current medicaiton Using medication without problems or lightheadedness: None Chest pain with exertion:None Edema:None Short of breath:None Average home BPs: at goal at home Other issues:   Diastolic CHF, euvolemic.. Followed by cardiology, Dr. Mariah Milling.. Follow up in 12/2011.  On lasix every other day, bblocker, cachannel.  RECENT LABS ARE NOT FASTING.  Elevated Cholesterol:Improved control on red yeast rice 2400 mg daily for  past 2 month. LDL not at goal <70.  Using medications without problems:None Muscle aches: None Diet compliance: Good Exercise:None Other complaints:  Insomnia, off and on: 1-2 times a month. Does not nap. Trouble falling asleep. Denies depression, anxiety. Gets up once a night to urinate. Using tylenol PM one tablet a night... No  SE.  Treated for walking PNA in 07/2011 by Dr. Salena Saner.. With Zpack. She has noted some persistant occ wheezing, no cough, no SOB, mucus in throat.   COPD.Marland Kitchen Using spiriva but only every other day.  CKD, stable followed by Nephrology.   Review of Systems  Constitutional: Negative for fever, fatigue and unexpected weight change.  HENT: Negative for ear pain, congestion, sore throat, sneezing, trouble swallowing and sinus pressure.        Dry mouth  Eyes: Negative for pain and itching.  Respiratory: Negative for cough, shortness of breath and wheezing.   Cardiovascular: Negative for chest pain, palpitations and leg swelling.  Gastrointestinal: Negative for nausea, abdominal pain, diarrhea, constipation and blood in stool.  Genitourinary: Negative for dysuria, hematuria, vaginal discharge, difficulty urinating and menstrual problem.  Skin: Negative for rash.  Neurological: Negative for syncope, weakness, light-headedness, numbness and headaches.  Psychiatric/Behavioral: Negative for confusion and dysphoric mood. The patient is not nervous/anxious.        Objective:   Physical Exam  Constitutional: Vital signs are normal. She appears well-developed and well-nourished. She is cooperative.  Non-toxic appearance. She does not appear ill. No distress.       Elderly female in NAD  HENT:  Head: Normocephalic.  Right Ear: Hearing, tympanic membrane, external ear and ear canal normal. Tympanic membrane is not erythematous, not retracted and not bulging.  Left Ear: Hearing, tympanic membrane, external ear and ear canal normal. Tympanic membrane is not erythematous, not retracted and not bulging.  Nose: No mucosal edema or rhinorrhea. Right sinus exhibits no maxillary sinus tenderness and  no frontal sinus tenderness. Left sinus exhibits no maxillary sinus tenderness and no frontal sinus tenderness.  Mouth/Throat: Uvula is midline, oropharynx is clear and moist and mucous membranes are normal.  Eyes:  Conjunctivae, EOM and lids are normal. Pupils are equal, round, and reactive to light. No foreign bodies found.  Neck: Trachea normal and normal range of motion. Neck supple. Carotid bruit is not present. No mass and no thyromegaly present.  Cardiovascular: Normal rate, regular rhythm, S1 normal, S2 normal, normal heart sounds, intact distal pulses and normal pulses.  Exam reveals no gallop and no friction rub.   No murmur heard. Pulmonary/Chest: Effort normal. Not tachypneic. No respiratory distress. She has no decreased breath sounds. She has wheezes. She has no rhonchi. She has no rales. She exhibits no tenderness.       Diffuse intermittant wheeze  Abdominal: Soft. Normal appearance and bowel sounds are normal. There is no tenderness. There is no CVA tenderness.  Neurological: She is alert.  Skin: Skin is warm, dry and intact. No rash noted.  Psychiatric: Her speech is normal and behavior is normal. Judgment and thought content normal. Her mood appears not anxious. Cognition and memory are normal. She does not exhibit a depressed mood.          Assessment & Plan:  AMW: The patient's preventative maintenance and recommended screening tests for an annual wellness exam were reviewed in full today. Brought up to date unless services declined.  Counselled on the importance of diet, exercise, and its role in overall health and mortality. The patient's FH and SH was reviewed, including their home life, tobacco status, and drug and alcohol status.   Vaccines: Uptodate with flu, PNA, Td, zoster DXA: last in 2010 normal. NO indication for mammo, breast exam, pelvic/pap, colonoscopy at this age.

## 2011-10-12 NOTE — Assessment & Plan Note (Signed)
Well controlled. Continue current medication.  

## 2011-10-12 NOTE — Patient Instructions (Addendum)
Trial of glucosamine 500 mg 1-3 times a day. Let us know if not helping.  Follow weights but for now continue lasix every other day.  Continue red yeast rice for cholesterol treatment.  Get back to using spiriva daily.. Call if breathing / wheezing not improving. Stop at front desk to set up DXA. Look into setting up living will, discuss with daughter. Follow up appt in 6 months.

## 2011-10-12 NOTE — Assessment & Plan Note (Signed)
Not taking Spiriva as instructed.. Return to daily use.  No clear sign of ongoing infection. No clear indication for prednisone taper.

## 2011-11-14 ENCOUNTER — Other Ambulatory Visit: Payer: Self-pay | Admitting: *Deleted

## 2011-11-14 MED ORDER — FESOTERODINE FUMARATE ER 8 MG PO TB24: 8.0000 mg | ORAL_TABLET | Freq: Every day | ORAL | Status: DC

## 2011-11-14 NOTE — Telephone Encounter (Signed)
I am not sure what else she has tried in the past or what is on her formulary (all this kind of med are name brans and expensive) so I just picked one to try.

## 2011-11-14 NOTE — Telephone Encounter (Signed)
Patient calling to get a refill on her enablex, states when she called to have it refilled at pharmacy it was 525.00. Patient wants to know if there is something she can get that is cheaper. Please advise

## 2011-11-15 NOTE — Telephone Encounter (Signed)
Patient advised.

## 2011-11-23 ENCOUNTER — Encounter: Payer: Self-pay | Admitting: Family Medicine

## 2011-12-25 ENCOUNTER — Ambulatory Visit (INDEPENDENT_AMBULATORY_CARE_PROVIDER_SITE_OTHER): Payer: Medicare Other | Admitting: Cardiovascular Disease

## 2011-12-25 ENCOUNTER — Encounter: Payer: Self-pay | Admitting: Cardiovascular Disease

## 2011-12-25 VITALS — BP 153/73 | HR 57 | Ht 62.0 in | Wt 140.0 lb

## 2011-12-25 DIAGNOSIS — I5032 Chronic diastolic (congestive) heart failure: Secondary | ICD-10-CM

## 2011-12-25 DIAGNOSIS — I10 Essential (primary) hypertension: Secondary | ICD-10-CM

## 2011-12-25 DIAGNOSIS — M79606 Pain in leg, unspecified: Secondary | ICD-10-CM | POA: Insufficient documentation

## 2011-12-25 DIAGNOSIS — M79609 Pain in unspecified limb: Secondary | ICD-10-CM

## 2011-12-25 DIAGNOSIS — I509 Heart failure, unspecified: Secondary | ICD-10-CM

## 2011-12-25 DIAGNOSIS — R0989 Other specified symptoms and signs involving the circulatory and respiratory systems: Secondary | ICD-10-CM

## 2011-12-25 MED ORDER — AMLODIPINE BESYLATE 5 MG PO TABS: 5.0000 mg | ORAL_TABLET | Freq: Two times a day (BID) | ORAL | Status: DC

## 2011-12-25 NOTE — Patient Instructions (Addendum)
You are doing well. Please take extra amlodipine at night if your blood pressure is high in the morning (amlodipine twice a day)  Please call us if you have new issues that need to be addressed before your next appt.  Your physician wants you to follow-up in: 6 months.  You will receive a reminder letter in the mail two months in advance. If you don't receive a letter, please call our office to schedule the follow-up appointment.  If your leg pain does not get better,  Hold the red yeast rice

## 2011-12-25 NOTE — Assessment & Plan Note (Signed)
Low GFR with creatinine of 2. Elevated BUN. Unable to exclude mild hypovolemia. We have not made any medication changes at this time. If creatinine and BUN continue to climb, we will need to decrease her Lasix.

## 2011-12-25 NOTE — Assessment & Plan Note (Signed)
Etiology of her right lower extremity pain is uncertain. She is taking red yeast rice. We have suggested she could hold this for a trial period to see if her leg pain improves. She does not feel it is a red yeast rice. We have also suggested she try a soft pillow between her knees when she sleeps. She reports having a history of radiating nerve pain from her back.

## 2011-12-25 NOTE — Progress Notes (Signed)
Patient ID: Elizabeth Silva, female    DOB: January 28, 1918, 78 y.o.   MRN: [id number]  HPI Comments: 78 yo with history of difficult to control HTN, COPD, hyperlipidemia, left bundle-branch block, moderate tricuspid valve regurgitation with normal ejection fraction in 2012, presents today for routine followup.     Her biggest complaint is right lower extremity lateral calf pain that comes on at night time over the past week. Pain radiates down the lateral aspect of the leg. She does not feel it is a cramp. Occasionally she has cramps in her left leg. She reports blood pressure has been elevated in the morning. Later in the day is typically in the 130 range. She reports that her edema has been well controlled on Lasix every other day. Seen in the past by Dr. Thedore Mins for abnormal renal function. Otherwise she feels well. she continues to have some difficulty walking. She does not want to use a cane and her daughter reports that she is too "proud".   Echo was suggestive of diastolic dysfunction with normal EF (60-65%). Moderate TR      EKG shows normal sinus rhythm with left bundle branch block, rate 55 beats per minute      Outpatient Encounter Prescriptions as of 12/25/2011  Medication Sig Dispense Refill  . acetaminophen (TYLENOL) 500 MG tablet Take 500 mg by mouth every 6 (six) hours as needed.        Marland Kitchen aspirin 81 MG EC tablet Take 81 mg by mouth daily.        . furosemide (LASIX) 20 MG tablet Take 20 mg by mouth daily.        Marland Kitchen gabapentin (NEURONTIN) 600 MG tablet Take 1 tablet (600 mg total) by mouth at bedtime.  180 tablet  1  . hydrALAZINE (APRESOLINE) 50 MG tablet Take 1.5 tablets (75 mg total) by mouth 3 (three) times daily.  315 tablet  3  . nebivolol (BYSTOLIC) 10 MG tablet Take 1 tablet (10 mg total) by mouth daily.  90 tablet  3  . omeprazole (PRILOSEC) 40 MG capsule       . Red Yeast Rice 600 MG CAPS Take 1 capsule by mouth daily.        Marland Kitchen tiotropium (SPIRIVA) 18 MCG inhalation capsule  Place 18 mcg into inhaler and inhale daily.        . vitamin E 1000 UNIT capsule Take 1,000 Units by mouth daily.        Marland Kitchen  amLODipine (NORVASC) 5 MG tablet Take 1 tablet (5 mg total) by mouth daily.  90 tablet  3  . Fesoterodine Fumarate 8 MG TB24 Take 1 tablet (8 mg total) by mouth daily.  30 tablet  3  . fluticasone (FLONASE) 50 MCG/ACT nasal spray 2 sprays by Nasal route daily.         Review of Systems  Constitutional: Negative.   HENT: Negative.   Eyes: Negative.   Respiratory: Negative.   Cardiovascular: Negative.   Gastrointestinal: Negative.   Musculoskeletal: Negative.        Severe right lower extremity leg pain at night  Skin: Negative.   Neurological: Negative.   Hematological: Negative.   Psychiatric/Behavioral: Negative.   All other systems reviewed and are negative.   BP 153/73  Pulse 57  Ht 5\' 2"  (1.575 m)  Wt 140 lb (63.504 kg)  BMI 25.61 kg/m2  Physical Exam  Nursing note and vitals reviewed. Constitutional: She is oriented to person, place, and time. She  appears well-developed and well-nourished.  HENT:  Head: Normocephalic.  Nose: Nose normal.  Mouth/Throat: Oropharynx is clear and moist.  Eyes: Conjunctivae are normal. Pupils are equal, round, and reactive to light.  Neck: Normal range of motion. Neck supple. No JVD present. Carotid bruit is present.  Cardiovascular: Normal rate, regular rhythm, S1 normal, S2 normal and intact distal pulses.  Exam reveals no gallop and no friction rub.   Murmur heard.  Crescendo systolic murmur is present with a grade of 2/6  Pulmonary/Chest: Effort normal and breath sounds normal. No respiratory distress. She has no wheezes. She has no rales. She exhibits no tenderness.  Abdominal: Soft. Bowel sounds are normal. She exhibits no distension. There is no tenderness.  Musculoskeletal: Normal range of motion. She exhibits no edema and no tenderness.  Lymphadenopathy:    She has no cervical adenopathy.  Neurological: She  is alert and oriented to person, place, and time. Coordination normal.  Skin: Skin is warm and dry. No rash noted. No erythema.  Psychiatric: She has a normal mood and affect. Her behavior is normal. Judgment and thought content normal.         Assessment and Plan

## 2011-12-25 NOTE — Assessment & Plan Note (Signed)
She appears to be relatively euvolemic on her current medications. Encouraged her to stay on her Lasix. She will likely need this given her moderate tricuspid valve regurgitation.

## 2011-12-25 NOTE — Assessment & Plan Note (Signed)
Blood pressure is elevated today. She also reports elevated systolic pressure in the morning. We have suggested she increase her amlodipine to 5 mg twice a day if her pressures continued to be high in the morning. We'll need to watch her edema. Today, she has no significant lower extremity edema.

## 2012-01-01 ENCOUNTER — Telehealth: Payer: Self-pay | Admitting: Family Medicine

## 2012-01-01 MED ORDER — HYDRALAZINE HCL 50 MG PO TABS: 75.0000 mg | ORAL_TABLET | Freq: Three times a day (TID) | ORAL | Status: DC

## 2012-01-01 NOTE — Telephone Encounter (Signed)
Okay to send in new script at higher dose.

## 2012-01-01 NOTE — Telephone Encounter (Signed)
rx sent to pharmacy as requested

## 2012-01-01 NOTE — Telephone Encounter (Signed)
Triage Record Num: [id number] Operator: Arline Asp Loftin Patient Name: Elizabeth Silva Call Date & Time: 01/01/2012 1:15:51PM Patient Phone: [phone] PCP: Kerby Nora Patient Gender: Female PCP Fax : [phone] Patient DOB: 12-30-1917 Practice Name: [facility] Reason for Call: Caller: Fawna/Patient is calling with a question about Hydralazine.The medication was written by Ermalene Searing, Amy E. Pt was prescribed Hydralazine 50mg , 1 tab daily, cardiologist upped to 1 1/2 tabs daily. Normally gets per Mail order. Is almost out. OFFICE PLEASE CALL [name] AT [phone] AND ADVISE IF NEW SCRIPT (90 DAY) HAS BEEN SENT TO MAIL ORDER (IN CHART) WILL ALSO NEED 1 MONTH SUPPLY CALLED IN TO [facility]. THANKS. Protocol(s) Used: Office Note Recommended Outcome per Protocol: Information Noted and Sent to Office Reason for Outcome: Caller information to office Care Advice: ~ 01/01/2012 1:22:18PM Page 1 of 1 [email or web address]

## 2012-01-03 ENCOUNTER — Other Ambulatory Visit: Payer: Self-pay

## 2012-01-03 MED ORDER — HYDRALAZINE HCL 50 MG PO TABS: 75.0000 mg | ORAL_TABLET | Freq: Three times a day (TID) | ORAL | Status: DC

## 2012-01-03 MED ORDER — GABAPENTIN 600 MG PO TABS: 600.0000 mg | ORAL_TABLET | Freq: Every day | ORAL | Status: DC

## 2012-01-03 NOTE — Telephone Encounter (Signed)
Pt called about refill being sent to local pharmacy; [facility]. Hydralazine sent electronically to [facility].Patient notified as instructed by telephone.

## 2012-01-03 NOTE — Telephone Encounter (Signed)
Pt calls and request refill Gabapentin sent to mail order [facility]. Pt last seen 10/12/11.Please advise.

## 2012-01-03 NOTE — Telephone Encounter (Signed)
OK to refill

## 2012-01-15 ENCOUNTER — Telehealth: Payer: Self-pay

## 2012-01-15 MED ORDER — FUROSEMIDE 20 MG PO TABS: 20.0000 mg | ORAL_TABLET | ORAL | Status: DC

## 2012-01-15 NOTE — Telephone Encounter (Signed)
Pt request refill on Furosemide 20 mg taking 1 tablet by mouth every other day. Pt said confirmed she is taking every other day. Pts med list has Furosemide 20 mg taking 1 daily. Please advise. Pt request #90 day supply sent to [facility] mail order pharmacy.Pt would like call back after med has been refilled [phone]. Pt last saw Dr Ermalene Searing 10/12/11.

## 2012-01-15 NOTE — Telephone Encounter (Signed)
Rx sent 

## 2012-02-05 ENCOUNTER — Other Ambulatory Visit: Payer: Self-pay

## 2012-02-05 MED ORDER — FESOTERODINE FUMARATE ER 8 MG PO TB24: 8.0000 mg | ORAL_TABLET | Freq: Every day | ORAL | Status: DC

## 2012-02-05 NOTE — Telephone Encounter (Signed)
Pt request refill Toviaz 8 mg # 90 x 1 to [facility] order. Patient notified as instructed by telephone.

## 2012-02-14 ENCOUNTER — Encounter: Payer: Self-pay | Admitting: Family Medicine

## 2012-02-14 ENCOUNTER — Ambulatory Visit (INDEPENDENT_AMBULATORY_CARE_PROVIDER_SITE_OTHER): Payer: Medicare Other | Admitting: Family Medicine

## 2012-02-14 ENCOUNTER — Telehealth: Payer: Self-pay | Admitting: Family Medicine

## 2012-02-14 VITALS — BP 148/60 | HR 74 | Temp 99.5°F | Ht 62.0 in | Wt 140.0 lb

## 2012-02-14 DIAGNOSIS — J441 Chronic obstructive pulmonary disease with (acute) exacerbation: Secondary | ICD-10-CM

## 2012-02-14 DIAGNOSIS — J209 Acute bronchitis, unspecified: Secondary | ICD-10-CM

## 2012-02-14 MED ORDER — DOXYCYCLINE HYCLATE 100 MG PO TABS: 100.0000 mg | ORAL_TABLET | Freq: Two times a day (BID) | ORAL | Status: AC

## 2012-02-14 MED ORDER — TIOTROPIUM BROMIDE MONOHYDRATE 18 MCG IN CAPS: 18.0000 ug | ORAL_CAPSULE | Freq: Every day | RESPIRATORY_TRACT | Status: DC

## 2012-02-14 MED ORDER — ALBUTEROL SULFATE HFA 108 (90 BASE) MCG/ACT IN AERS: 2.0000 | INHALATION_SPRAY | Freq: Four times a day (QID) | RESPIRATORY_TRACT | Status: DC | PRN

## 2012-02-14 MED ORDER — PREDNISONE 20 MG PO TABS: 20.0000 mg | ORAL_TABLET | Freq: Every day | ORAL | Status: AC

## 2012-02-14 NOTE — Telephone Encounter (Signed)
Caller: Elizabeth Silva/Patient; PCP: Excell Seltzer.; CB#: [phone];  Call regarding Cough/Congestion and wheezing; sx  started 3 months ago; sx are getting worse now; temp 99.5 at present time; she thinks she has has a fever for several days; coughing up white sputum;  gets sob carrying on conversation; pt doesn't appear in severe distress during triage;  Triaged per Breathing Problems Guideline; Call 911 d/t worsening breathing problems and know cardiac/resp condition not responding to available tx; pt refuses to call 911; she states that she wants to be seen in the office; office called and instructed to have pt come to the office now; pt will comply; pt will have daughter take her now to the office; instructed if sx worsen before seen call 911; will comply

## 2012-02-14 NOTE — Progress Notes (Signed)
Patient Name: Elizabeth Silva Date of Birth: September 18, 1918 Age: 78 y.o. Medical Record Number: [id number] Gender: female Date of Encounter: 02/14/2012  History of Present Illness:  Elizabeth Silva is a 78 y.o. very pleasant female patient who presents with the following:  Has been getting worse, maybe in the last 10 days. Productive.  Could not get warm this morning. Had a sheet and blanket and heavy bedspread. Took a 99 temp. Has been cold all morning.   She has been out of her Spiriva for least 6 weeks. She generally does not feel well, she has a productive cough, she has some minimal amount of nasal congestion.  Patient Active Problem List  Diagnoses  . HYPERCHOLESTEROLEMIA, PURE  . PERIPHERAL NEUROPATHY, IDIOPATHIC  . ESSENTIAL HYPERTENSION  . DIASTOLIC HEART FAILURE, CHRONIC  . PERIPHERAL VASCULAR DISEASE  . VENOUS INSUFFICIENCY  . ALLERGIC RHINITIS  . COPD  . GERD  . DIVERTICULOSIS, COLON  . KIDNEY DISEASE, CHRONIC, STAGE III  . RENAL STONE  . OSTEOARTHRITIS  . DEGENERATION, CERVICAL DISC  . CARDIAC MURMUR  . CAROTID BRUIT, RIGHT  . DYSURIA, CHRONIC  . INCONTINENCE, URGE  . STRESS INCONTINENCE  . CONSTIPATION  . ANEMIA OF RENAL FAILURE  . Leg pain   Past Medical History  Diagnosis Date  . Allergic rhinitis   . COPD (chronic obstructive pulmonary disease)   . Diverticulosis of colon   . GERD (gastroesophageal reflux disease)   . Hyperlipidemia   . Hypertension     LE swelling with amlodipine, intolerant of ACEIs  . Osteoarthritis   . CKD (chronic kidney disease)   . Impaired vision     right eye, was told due to HTN  . LBBB (left bundle branch block)   . Diastolic CHF, acute     echo (9/14) with EF 60-65%,mild LVH, mild aortic insufficiency, mild MR, PA systolic pressure 37-41 mmHg, mild to moderate TR  . History of PFTs    Past Surgical History  Procedure Date  . Cardiac catheterization 1999    normal  . Total abdominal hysterectomy 1970    suspicious  cells  . Cholecystectomy 1980  . Cervical disc surgery 2005    cerv. disc, dz. C5-C7, facet arthritis   History  Substance Use Topics  . Smoking status: Former Smoker -- 1.0 packs/day    Types: Cigarettes    Quit date: 10/15/1984  . Smokeless tobacco: Not on file  . Alcohol Use: Yes   Family History  Problem Relation Age of Onset  . Heart block Mother     complete  . Dementia Father   . Colon cancer Sister   . Dementia Sister   . Lung cancer Brother   . Cancer Brother     sinus  . Diabetes Brother    Allergies  Allergen Reactions  . Levofloxacin     REACTION: Burning in legs  . Penicillins     REACTION: Rash   Current Outpatient Prescriptions on File Prior to Visit  Medication Sig Dispense Refill  . acetaminophen (TYLENOL) 500 MG tablet Take 500 mg by mouth every 6 (six) hours as needed.        Marland Kitchen amLODipine (NORVASC) 5 MG tablet Take 1 tablet (5 mg total) by mouth 2 (two) times daily.  180 tablet  3  . aspirin 81 MG EC tablet Take 81 mg by mouth daily.        Marland Kitchen Fesoterodine Fumarate 8 MG TB24 Take 1 tablet (8 mg total) by mouth daily.  90 tablet  1  . furosemide (LASIX) 20 MG tablet Take 1 tablet (20 mg total) by mouth every other day.  45 tablet  3  . gabapentin (NEURONTIN) 600 MG tablet Take 1 tablet (600 mg total) by mouth at bedtime.  90 tablet  3  . hydrALAZINE (APRESOLINE) 50 MG tablet Take 1.5 tablets (75 mg total) by mouth 3 (three) times daily.  150 tablet  0  . nebivolol (BYSTOLIC) 10 MG tablet Take 1 tablet (10 mg total) by mouth daily.  90 tablet  3  . omeprazole (PRILOSEC) 40 MG capsule Take 40 mg by mouth daily.       . Red Yeast Rice 600 MG CAPS Take 1 capsule by mouth daily.        . vitamin E 1000 UNIT capsule Take 1,000 Units by mouth daily.        Marland Kitchen DISCONTD: tiotropium (SPIRIVA) 18 MCG inhalation capsule Place 18 mcg into inhaler and inhale daily.        Marland Kitchen albuterol (PROVENTIL HFA;VENTOLIN HFA) 108 (90 BASE) MCG/ACT inhaler Inhale 2 puffs into the  lungs every 6 (six) hours as needed for wheezing.  1 Inhaler  3     Past Medical History, Surgical History, Social History, Family History, Problem List, Medications, and Allergies have been reviewed and updated if relevant.  Review of Systems: ROS: GEN: Acute illness details above GI: Tolerating PO intake GU: maintaining adequate hydration and urination Pulm: mild sob and wheezing Interactive and getting along well at home.  Otherwise, ROS is as per the HPI.   Physical Examination: Filed Vitals:   02/14/12 1313  BP: 148/60  Pulse: 74  Temp: 99.5 F (37.5 C)  TempSrc: Oral  Height: 5\' 2"  (1.575 m)  Weight: 140 lb (63.504 kg)  SpO2: 96%    Body mass index is 25.61 kg/(m^2).   GEN: A and O x 3. WDWN. NAD.    ENT: Nose clear, ext NML.  No LAD.  No JVD.  TM's clear. Oropharynx clear.  PULM: Normal WOB, no distress. No crackles, rare rhonchi lower lobes CV: RRR, no M/G/R, No rubs, No JVD.   EXT: warm and well-perfused, No c/c/e. PSYCH: Pleasant and conversant.   Assessment and Plan:  1. COPD exacerbation   2. Bronchitis, acute    Acute bronchitis: discussed plan of care. Given length of symptoms and overall history, will treat with ABX in this case. Continue with additional supportive care, cough medications, liquids, sleep, steam / vaporizer. COPD - pulse steroids, restart inhalers  Orders Today: No orders of the defined types were placed in this encounter.    Medications Today: Meds ordered this encounter  Medications  . tiotropium (SPIRIVA) 18 MCG inhalation capsule    Sig: Place 1 capsule (18 mcg total) into inhaler and inhale daily.    Dispense:  90 capsule    Refill:  3  . doxycycline (VIBRA-TABS) 100 MG tablet    Sig: Take 1 tablet (100 mg total) by mouth 2 (two) times daily.    Dispense:  20 tablet    Refill:  0  . albuterol (PROVENTIL HFA;VENTOLIN HFA) 108 (90 BASE) MCG/ACT inhaler    Sig: Inhale 2 puffs into the lungs every 6 (six) hours as needed  for wheezing.    Dispense:  1 Inhaler    Refill:  3  . predniSONE (DELTASONE) 20 MG tablet    Sig: Take 1 tablet (20 mg total) by mouth daily.    Dispense:  7 tablet    Refill:  0

## 2012-02-21 ENCOUNTER — Telehealth: Payer: Self-pay | Admitting: Family Medicine

## 2012-02-21 DIAGNOSIS — N039 Chronic nephritic syndrome with unspecified morphologic changes: Secondary | ICD-10-CM

## 2012-02-21 NOTE — Telephone Encounter (Signed)
Caller: Tanetta/Patient; PCP: Kerby Nora E.; CB#: [phone]. Call regarding ?? for MD. Caller reports Dr Ermalene Searing told her that she could take care of her "Kidneys" so she does not have to see specialist. Caller has an appt with PCP on Tues 5/28 and is wondering if MD would like her to have labs before her appt.  Caller can be reached re same at above #.

## 2012-02-21 NOTE — Telephone Encounter (Signed)
APPT SCHEDULED FOR LABS

## 2012-02-21 NOTE — Telephone Encounter (Signed)
Yes.. Needs labs prior.Marland Kitchen ordered

## 2012-03-06 ENCOUNTER — Other Ambulatory Visit (INDEPENDENT_AMBULATORY_CARE_PROVIDER_SITE_OTHER): Payer: Medicare Other

## 2012-03-06 DIAGNOSIS — D631 Anemia in chronic kidney disease: Secondary | ICD-10-CM

## 2012-03-06 DIAGNOSIS — N039 Chronic nephritic syndrome with unspecified morphologic changes: Secondary | ICD-10-CM

## 2012-03-06 DIAGNOSIS — N189 Chronic kidney disease, unspecified: Secondary | ICD-10-CM

## 2012-03-06 LAB — CBC WITH DIFFERENTIAL/PLATELET
Basophils Relative: 1.2 % (ref 0.0–3.0)
Eosinophils Relative: 5.6 % — ABNORMAL HIGH (ref 0.0–5.0)
HCT: 34.6 % — ABNORMAL LOW (ref 36.0–46.0)
Hemoglobin: 11.3 g/dL — ABNORMAL LOW (ref 12.0–15.0)
Lymphs Abs: 1.4 10*3/uL (ref 0.7–4.0)
MCV: 94.2 fl (ref 78.0–100.0)
Monocytes Absolute: 0.7 10*3/uL (ref 0.1–1.0)
Neutro Abs: 3.5 10*3/uL (ref 1.4–7.7)
RBC: 3.68 Mil/uL — ABNORMAL LOW (ref 3.87–5.11)
WBC: 6.1 10*3/uL (ref 4.5–10.5)

## 2012-03-06 LAB — COMPREHENSIVE METABOLIC PANEL
Albumin: 3.4 g/dL — ABNORMAL LOW (ref 3.5–5.2)
BUN: 37 mg/dL — ABNORMAL HIGH (ref 6–23)
Calcium: 9.1 mg/dL (ref 8.4–10.5)
Chloride: 103 mEq/L (ref 96–112)
Creatinine, Ser: 2 mg/dL — ABNORMAL HIGH (ref 0.4–1.2)
GFR: 25.04 mL/min — ABNORMAL LOW (ref >60.00)
Glucose, Bld: 112 mg/dL — ABNORMAL HIGH (ref 70–99)
Potassium: 4.8 mEq/L (ref 3.5–5.1)

## 2012-03-11 ENCOUNTER — Ambulatory Visit (INDEPENDENT_AMBULATORY_CARE_PROVIDER_SITE_OTHER)
Admission: RE | Admit: 2012-03-11 | Discharge: 2012-03-11 | Disposition: A | Discharge status: Home / Self Care | Payer: Medicare Other | Source: Ambulatory Visit | Attending: Family Medicine | Admitting: Family Medicine

## 2012-03-11 ENCOUNTER — Telehealth: Payer: Self-pay | Admitting: Family Medicine

## 2012-03-11 ENCOUNTER — Ambulatory Visit (INDEPENDENT_AMBULATORY_CARE_PROVIDER_SITE_OTHER): Payer: Medicare Other | Admitting: Family Medicine

## 2012-03-11 ENCOUNTER — Encounter: Payer: Self-pay | Admitting: Family Medicine

## 2012-03-11 VITALS — BP 138/50 | HR 68 | Temp 98.9°F | Wt 137.0 lb

## 2012-03-11 DIAGNOSIS — I5032 Chronic diastolic (congestive) heart failure: Secondary | ICD-10-CM

## 2012-03-11 DIAGNOSIS — R05 Cough: Secondary | ICD-10-CM

## 2012-03-11 DIAGNOSIS — I509 Heart failure, unspecified: Secondary | ICD-10-CM

## 2012-03-11 DIAGNOSIS — Z7722 Contact with and (suspected) exposure to environmental tobacco smoke (acute) (chronic): Secondary | ICD-10-CM

## 2012-03-11 DIAGNOSIS — R053 Chronic cough: Secondary | ICD-10-CM | POA: Insufficient documentation

## 2012-03-11 DIAGNOSIS — R9389 Abnormal findings on diagnostic imaging of other specified body structures: Secondary | ICD-10-CM

## 2012-03-11 DIAGNOSIS — I10 Essential (primary) hypertension: Secondary | ICD-10-CM

## 2012-03-11 DIAGNOSIS — J449 Chronic obstructive pulmonary disease, unspecified: Secondary | ICD-10-CM

## 2012-03-11 DIAGNOSIS — N184 Chronic kidney disease, stage 4 (severe): Secondary | ICD-10-CM

## 2012-03-11 NOTE — Assessment & Plan Note (Signed)
Euvolemic on every other day lasix, oc skips on weekend. Follows weights.

## 2012-03-11 NOTE — Progress Notes (Signed)
Subjective:    Patient ID: Elizabeth Silva, female    DOB: 1918-04-16, 78 y.o.   MRN: [id number]  HPI  Seen for continued COPD exacerbation on 02/14/2012 by Dr. Salena Saner... Given prednisone and doxycycline. She reports got a little better but still cough, white sputum greatest in AM, low  grade temp at 99.8, intermittant SOB, wheezing. Now ongoing for 1 month.  Needing albuterol several times a day.  Also : Treated for walking PNA in 07/2011 by Dr. Salena Saner.. With Zpack.  She has noted some persistant occ wheezing, no cough, no SOB, mucus in throat.   Insomnia better than in past.  Hypertension: Well controlled on current medication, she has taken occ extra amlodipine as Dr. Mariah Milling requested. Using medication without problems or lightheadedness: None  Chest pain with exertion:None  Edema:None, no increase with extra amlodipine. Short of breath:None  Average home BPs: 130-140/50-60 Other issues:   Diastolic CHF, euvolemic.. Followed by cardiology, Dr. Mariah Milling.. Last follow up in 12/2011.  On lasix every other day, bblocker, cachannel.  Taking weight daily.  COPD.Marland Kitchen Using spiriva but only every other day.   CKD, stable followed by Nephrology.  Cr 2.0  Prediabetes: improved.  Tried glucosamine for arthritis, has helped hip pain some.      Review of Systems  Constitutional: Negative for fever and fatigue.  HENT: Negative for ear pain.   Eyes: Negative for pain.  Respiratory: Positive for cough, shortness of breath and wheezing. Negative for chest tightness.   Cardiovascular: Positive for leg swelling. Negative for chest pain and palpitations.  Gastrointestinal: Negative for abdominal pain.  Genitourinary: Negative for dysuria.       Objective:   Physical Exam  Constitutional: Vital signs are normal. She appears well-developed and well-nourished. She is cooperative.  Non-toxic appearance. She does not appear ill. No distress.       Elderly female in NAD  HENT:  Head: Normocephalic.  Right  Ear: Hearing, tympanic membrane, external ear and ear canal normal. Tympanic membrane is not erythematous, not retracted and not bulging.  Left Ear: Hearing, tympanic membrane, external ear and ear canal normal. Tympanic membrane is not erythematous, not retracted and not bulging.  Nose: No mucosal edema or rhinorrhea. Right sinus exhibits no maxillary sinus tenderness and no frontal sinus tenderness. Left sinus exhibits no maxillary sinus tenderness and no frontal sinus tenderness.  Mouth/Throat: Uvula is midline, oropharynx is clear and moist and mucous membranes are normal.  Eyes: Conjunctivae, EOM and lids are normal. Pupils are equal, round, and reactive to light. No foreign bodies found.  Neck: Trachea normal and normal range of motion. Neck supple. Carotid bruit is not present. No mass and no thyromegaly present.  Cardiovascular: Normal rate, regular rhythm, S1 normal, S2 normal, normal heart sounds, intact distal pulses and normal pulses.  Exam reveals no gallop and no friction rub.   No murmur heard. Pulmonary/Chest: Effort normal and breath sounds normal. Not tachypneic. No respiratory distress. She has no decreased breath sounds. She has no wheezes. She has no rhonchi. She has no rales. She exhibits no tenderness.       NO wheeze  Abdominal: Soft. Normal appearance and bowel sounds are normal. There is no tenderness. There is no CVA tenderness.  Neurological: She is alert.  Skin: Skin is warm, dry and intact. No rash noted.  Psychiatric: Her speech is normal and behavior is normal. Judgment and thought content normal. Her mood appears not anxious. Cognition and memory are normal. She does not exhibit  a depressed mood.          Assessment & Plan:

## 2012-03-11 NOTE — Assessment & Plan Note (Signed)
occ using extra amlodipine without any additional swelling.

## 2012-03-11 NOTE — Assessment & Plan Note (Signed)
Appears well controlled on spirometry today on spiriva and albuterol prn.

## 2012-03-11 NOTE — Assessment & Plan Note (Signed)
CXR appears clear, will await radiologist review.

## 2012-03-11 NOTE — Assessment & Plan Note (Signed)
Stable, followed by nephrology.

## 2012-03-11 NOTE — Patient Instructions (Addendum)
Limit magnesium,  do not exceed 20 g/48 hours. If not helping with cramps do not continue past 2 weeks or so. Follow weights and keep them steady.. Use lasix about every other day. We will call with radiologist X-ray results. Call us if temperature continuing in next 2-3 days.  Follow up in 2 weeks for re-eval of lungs.

## 2012-03-11 NOTE — Telephone Encounter (Signed)
Called pt to discuss CXR reading from radiaologist... ? Right sided possible mass or consolidation. Recommended chest CT non contrast to evaluate persistant cough in pt with second hnd smoke exposure.

## 2012-03-18 ENCOUNTER — Ambulatory Visit (INDEPENDENT_AMBULATORY_CARE_PROVIDER_SITE_OTHER)
Admission: RE | Admit: 2012-03-18 | Discharge: 2012-03-18 | Disposition: A | Discharge status: Home / Self Care | Payer: Medicare Other | Source: Ambulatory Visit | Attending: Family Medicine | Admitting: Family Medicine

## 2012-03-18 ENCOUNTER — Telehealth: Payer: Self-pay | Admitting: Family Medicine

## 2012-03-18 DIAGNOSIS — Z9189 Other specified personal risk factors, not elsewhere classified: Secondary | ICD-10-CM

## 2012-03-18 DIAGNOSIS — R918 Other nonspecific abnormal finding of lung field: Secondary | ICD-10-CM

## 2012-03-18 DIAGNOSIS — R9389 Abnormal findings on diagnostic imaging of other specified body structures: Secondary | ICD-10-CM

## 2012-03-18 DIAGNOSIS — Z7722 Contact with and (suspected) exposure to environmental tobacco smoke (acute) (chronic): Secondary | ICD-10-CM

## 2012-03-18 DIAGNOSIS — R05 Cough: Secondary | ICD-10-CM

## 2012-03-18 MED ORDER — MOXIFLOXACIN HCL 400 MG PO TABS: 400.0000 mg | ORAL_TABLET | Freq: Every day | ORAL | Status: AC

## 2012-03-18 MED ORDER — MOXIFLOXACIN HCL 400 MG PO TABS: 400.0000 mg | ORAL_TABLET | Freq: Every day | ORAL | Status: DC

## 2012-03-18 NOTE — Telephone Encounter (Signed)
Sent in Rx to mail order by mistake.Marland Kitchen Please call them to cancel. I then sent it to [facility].

## 2012-03-18 NOTE — Telephone Encounter (Signed)
Advised pt.  She uses [facility].

## 2012-03-18 NOTE — Telephone Encounter (Signed)
Notify pt that there is an area of consolidation that may represent pneumonia in right middle lobe of lungs.  Wioll treat with antibitoics (will send in) but will repeat CT chest in 3-4 weeks to determine if resolved. If not consider further work up.

## 2012-04-08 ENCOUNTER — Encounter: Payer: Self-pay | Admitting: Family Medicine

## 2012-04-08 ENCOUNTER — Ambulatory Visit (INDEPENDENT_AMBULATORY_CARE_PROVIDER_SITE_OTHER)
Admission: RE | Admit: 2012-04-08 | Discharge: 2012-04-08 | Disposition: A | Discharge status: Home / Self Care | Payer: Medicare Other | Source: Ambulatory Visit | Attending: Family Medicine | Admitting: Family Medicine

## 2012-04-08 ENCOUNTER — Ambulatory Visit (INDEPENDENT_AMBULATORY_CARE_PROVIDER_SITE_OTHER): Payer: Medicare Other | Admitting: Family Medicine

## 2012-04-08 VITALS — BP 130/70 | HR 66 | Temp 97.8°F | Ht 62.0 in | Wt 139.8 lb

## 2012-04-08 DIAGNOSIS — R05 Cough: Secondary | ICD-10-CM

## 2012-04-08 DIAGNOSIS — I872 Venous insufficiency (chronic) (peripheral): Secondary | ICD-10-CM

## 2012-04-08 DIAGNOSIS — J449 Chronic obstructive pulmonary disease, unspecified: Secondary | ICD-10-CM

## 2012-04-08 DIAGNOSIS — I5032 Chronic diastolic (congestive) heart failure: Secondary | ICD-10-CM

## 2012-04-08 DIAGNOSIS — R918 Other nonspecific abnormal finding of lung field: Secondary | ICD-10-CM

## 2012-04-08 NOTE — Assessment & Plan Note (Signed)
Gave prescription for compression hose 15-30 mmHg.

## 2012-04-08 NOTE — Progress Notes (Signed)
Subjective:    Patient ID: Elizabeth Silva, female    DOB: September 27, 1918, 78 y.o.   MRN: [id number]  HPI 78 year old female presents for 2 week follow up on persistant cough, PNA seen on Chest CT. COPD stable on spiriva and medicare.   CXR at last appt  03/11/2012 showed : Interval development of a new mass-like opacity in the right  lower lung. While this could certainly represent a focus of  airspace consolidation, underlying neoplasm is difficult to  exclude.   Chest CT:  03/18/12 1. Right middle lobe mass-like consolidation with surrounding  ground-glass opacity. Especially given the history of cough and  fever, favored to represent an area of pneumonia. Underlying  neoplasm cannot be excluded. Recommend plain film follow-up at 3 -  4 weeks after an appropriate antibiotic therapy. If persistent,  follow-up CT and possibly PET would be indicated.  2. Patchy interstitial opacities are mild and also felt to  represent infection.  3. Small right pleural effusion.  4. Mild thoracic adenopathy, likely reactive. If the patient  undergoes follow-up CT, recommend attention.  5. Pulmonary artery enlargement suggests pulmonary arterial  hypertension.  Treated with 14 days of avelox.  There was miscommunication and pt thought she was supposed to take a second course of antibiotics. She has about 8out of 14 more tablets to take of second course of avelox  She report continued coughing,same as before,  feels mucus in throat. Occ productive. Hoarse voice. Has noted SOB and decrease energy...worse than at last OV.       Review of Systems  Constitutional: Positive for fatigue. Negative for fever.  HENT: Positive for ear pain.   Eyes: Negative for pain.  Respiratory: Positive for cough and shortness of breath.   Cardiovascular: Positive for leg swelling. Negative for chest pain and palpitations.       Right leg stable swelling  Gastrointestinal: Negative for abdominal pain.  Skin: Negative  for rash.       Objective:   Physical Exam  Constitutional: Vital signs are normal. She appears well-developed and well-nourished. She is cooperative.  Non-toxic appearance. She does not appear ill. No distress.       Elderly female in NAD  HENT:  Head: Normocephalic.  Right Ear: Hearing, tympanic membrane, external ear and ear canal normal. Tympanic membrane is not erythematous, not retracted and not bulging.  Left Ear: Hearing, tympanic membrane, external ear and ear canal normal. Tympanic membrane is not erythematous, not retracted and not bulging.  Nose: No mucosal edema or rhinorrhea. Right sinus exhibits no maxillary sinus tenderness and no frontal sinus tenderness. Left sinus exhibits no maxillary sinus tenderness and no frontal sinus tenderness.  Mouth/Throat: Uvula is midline, oropharynx is clear and moist and mucous membranes are normal.  Eyes: Conjunctivae, EOM and lids are normal. Pupils are equal, round, and reactive to light. No foreign bodies found.  Neck: Trachea normal and normal range of motion. Neck supple. Carotid bruit is not present. No mass and no thyromegaly present.  Cardiovascular: Normal rate, regular rhythm, S1 normal, S2 normal, normal heart sounds, intact distal pulses and normal pulses.  Exam reveals no gallop and no friction rub.   No murmur heard.      Right leg 1 plus nonpitting edema, minimal left leg.. Wearing compression hose.  B varicose veins  Pulmonary/Chest: Effort normal and breath sounds normal. Not tachypneic. No respiratory distress. She has no decreased breath sounds. She has no wheezes. She has no rhonchi. She  has no rales. She exhibits no tenderness.       NO wheeze  Abdominal: Soft. Normal appearance and bowel sounds are normal. There is no tenderness. There is no CVA tenderness.  Neurological: She is alert.  Skin: Skin is warm, dry and intact. No rash noted.  Psychiatric: Her speech is normal and behavior is normal. Judgment and thought  content normal. Her mood appears not anxious. Cognition and memory are normal. She does not exhibit a depressed mood.          Assessment & Plan:

## 2012-04-08 NOTE — Assessment & Plan Note (Signed)
Will repeat CXR today to re-evaluate consolidation seen on CXR and Chest CT. If contnues to be present we will have her continue second course of avelox and will refer to pulm for possible repeat CT or PET scan to eval further. Symptoms are no better.

## 2012-04-08 NOTE — Assessment & Plan Note (Signed)
Weights and fluid status stable.. No CHF exacerbation felt to be contributing to symtpoms.

## 2012-04-08 NOTE — Patient Instructions (Addendum)
We will call you with X-ray results later today or tommorow morning.

## 2012-04-09 ENCOUNTER — Telehealth: Payer: Self-pay | Admitting: Family Medicine

## 2012-04-09 DIAGNOSIS — R918 Other nonspecific abnormal finding of lung field: Secondary | ICD-10-CM

## 2012-04-09 DIAGNOSIS — R05 Cough: Secondary | ICD-10-CM

## 2012-04-09 NOTE — Telephone Encounter (Signed)
Message copied by Excell Seltzer on Wed Apr 09, 2012 11:40 PM ------      Message from: Consuello Masse      Created: Wed Apr 09, 2012  8:34 AM       Patient  Would like to go to pulmonology in [place].             Advised patient of results

## 2012-04-10 ENCOUNTER — Telehealth: Payer: Self-pay | Admitting: Pulmonary Disease

## 2012-04-10 NOTE — Telephone Encounter (Signed)
Calling back will call after lunch

## 2012-04-10 NOTE — Telephone Encounter (Signed)
Spoke with Shirlee Limerick. I advised okay to overbook BQ on 05/05/12 at 1:30 pm- have the pt arrive at 1:15 pm. She states will have to check with referring doc and see if okay to wait this long. Will await call back.

## 2012-04-10 NOTE — Telephone Encounter (Signed)
Returning call can be reached at [phone].Elizabeth Silva

## 2012-04-10 NOTE — Telephone Encounter (Signed)
LMOM for Elizabeth Silva to return triage call.  1st available in [place] office is 05/20/12 with  Dr. Kendrick Fries 1st available in [place] office is 05/12/12 with MW.

## 2012-04-15 ENCOUNTER — Telehealth: Payer: Self-pay | Admitting: Family Medicine

## 2012-04-15 NOTE — Telephone Encounter (Signed)
Elizabeth Silva called and stated they wouldn't need appt on 7/22 for pt because Shirlee Limerick scheduled a 7/5 appt with Dr Meredeth Ide.Raylene Everts

## 2012-04-15 NOTE — Telephone Encounter (Signed)
This is not soon enough... I believe Shirlee Limerick got her in somewhere else sooner in [place].

## 2012-04-15 NOTE — Telephone Encounter (Signed)
I called and spoke with receptionist at [facility]. She states Shirlee Limerick was on vacation, but she checked in pt chart and did not see a message to Dr. Ermalene Searing about the appt so she is sending a message now to ask if 05-05-12 appt is ok. She will let us know.Carron Curie, CMA

## 2012-04-15 NOTE — Telephone Encounter (Signed)
Dr. Janne Lab office just called and said that they got Elizabeth Silva set up for 7.22.13 but wanted to make sure with her primary care that this would not be to much of a wait???

## 2012-04-15 NOTE — Telephone Encounter (Signed)
The nurse also wanted [place] to contact them back at [phone] once we found out if the appt set for 7.22.13 would be ok and ask for a triage nurse.

## 2012-04-15 NOTE — Telephone Encounter (Signed)
Yes.. I told nurse to have her continue antibiotic given the results of the chest X-ray.

## 2012-04-15 NOTE — Telephone Encounter (Signed)
Caller: Elizabeth Silva/Patient; Phone Number: [phone]; Message from caller: Pt states physician told her to stop antibiotic but when the nurse called about her X-ray she said keep taking your antibiotic.  Pt needs clarification.

## 2012-04-15 NOTE — Telephone Encounter (Signed)
Patient advised.

## 2012-04-23 ENCOUNTER — Ambulatory Visit: Payer: Self-pay | Admitting: Specialist

## 2012-05-07 ENCOUNTER — Ambulatory Visit: Payer: Self-pay | Admitting: Specialist

## 2012-05-14 ENCOUNTER — Ambulatory Visit: Payer: Self-pay | Admitting: Oncology

## 2012-05-15 ENCOUNTER — Ambulatory Visit: Payer: Self-pay | Admitting: Oncology

## 2012-05-20 ENCOUNTER — Telehealth: Payer: Self-pay | Admitting: Family Medicine

## 2012-05-20 NOTE — Telephone Encounter (Signed)
Caller: Josefita/Patient; PCP: Kerby Nora E.; CB#: [phone];  Call regarding Urinary Pain/Bleeding;  Notes urinary frequency, dysuria, and frequency.  Onset: 05/17/12.  Afebrile.  Advised to see MD within 24 hourrs for one or more urinary tract symptoms not previously evaluated per Urine Symptoms. Appointment scheduled for 05/21/12 at 1130 with Dr Dayton Martes.

## 2012-05-20 NOTE — Telephone Encounter (Signed)
Agree with appt. 

## 2012-05-21 ENCOUNTER — Encounter: Payer: Self-pay | Admitting: Family Medicine

## 2012-05-21 ENCOUNTER — Ambulatory Visit (INDEPENDENT_AMBULATORY_CARE_PROVIDER_SITE_OTHER): Payer: Medicare Other | Admitting: Family Medicine

## 2012-05-21 ENCOUNTER — Ambulatory Visit: Payer: Medicare Other | Admitting: Family Medicine

## 2012-05-21 VITALS — BP 136/52 | HR 64 | Temp 98.0°F | Wt 133.0 lb

## 2012-05-21 DIAGNOSIS — R300 Dysuria: Secondary | ICD-10-CM

## 2012-05-21 LAB — POCT URINALYSIS DIPSTICK
Blood, UA: NEGATIVE
Ketones, UA: NEGATIVE
Nitrite, UA: NEGATIVE

## 2012-05-21 MED ORDER — CIPROFLOXACIN HCL 250 MG PO TABS: 250.0000 mg | ORAL_TABLET | Freq: Two times a day (BID) | ORAL | Status: AC

## 2012-05-21 NOTE — Progress Notes (Signed)
SUBJECTIVE: Elizabeth Silva is a 78 y.o. female who complains of urinary frequency, urgency and dysuria x 3 days, without flank pain, fever, chills, or abnormal vaginal discharge or bleeding.   She does have history of chronic dysuria but feels these symptoms have been acutely worse.  PCN allergic. Also allergic to levaquin but states she CAN tolerate cipro.  Patient Active Problem List  Diagnosis  . HYPERCHOLESTEROLEMIA, PURE  . PERIPHERAL NEUROPATHY, IDIOPATHIC  . ESSENTIAL HYPERTENSION  . DIASTOLIC HEART FAILURE, CHRONIC  . PERIPHERAL VASCULAR DISEASE  . VENOUS INSUFFICIENCY  . ALLERGIC RHINITIS  . COPD  . GERD  . DIVERTICULOSIS, COLON  . Chronic kidney disease, stage 4, severely decreased GFR  . RENAL STONE  . OSTEOARTHRITIS  . DEGENERATION, CERVICAL DISC  . CARDIAC MURMUR  . CAROTID BRUIT, RIGHT  . DYSURIA, CHRONIC  . INCONTINENCE, URGE  . STRESS INCONTINENCE  . CONSTIPATION  . ANEMIA OF RENAL FAILURE  . Cough, persistent   Past Medical History  Diagnosis Date  . Allergic rhinitis   . COPD (chronic obstructive pulmonary disease)   . Diverticulosis of colon   . GERD (gastroesophageal reflux disease)   . Hyperlipidemia   . Hypertension     LE swelling with amlodipine, intolerant of ACEIs  . Osteoarthritis   . CKD (chronic kidney disease)   . Impaired vision     right eye, was told due to HTN  . LBBB (left bundle branch block)   . Diastolic CHF, acute     echo (1/61) with EF 60-65%,mild LVH, mild aortic insufficiency, mild MR, PA systolic pressure 37-41 mmHg, mild to moderate TR  . History of PFTs    Past Surgical History  Procedure Date  . Cardiac catheterization 1999    normal  . Total abdominal hysterectomy 1970    suspicious cells  . Cholecystectomy 1980  . Cervical disc surgery 2005    cerv. disc, dz. C5-C7, facet arthritis   History  Substance Use Topics  . Smoking status: Former Smoker -- 1.0 packs/day    Types: Cigarettes    Quit date: 10/15/1984   . Smokeless tobacco: Not on file  . Alcohol Use: Yes   Family History  Problem Relation Age of Onset  . Heart block Mother     complete  . Dementia Father   . Colon cancer Sister   . Dementia Sister   . Lung cancer Brother   . Cancer Brother     sinus  . Diabetes Brother    Allergies  Allergen Reactions  . Levofloxacin     REACTION: Burning in legs  . Penicillins     REACTION: Rash   Current Outpatient Prescriptions on File Prior to Visit  Medication Sig Dispense Refill  . acetaminophen (TYLENOL) 500 MG tablet Take 500 mg by mouth every 6 (six) hours as needed.        Marland Kitchen albuterol (PROVENTIL HFA;VENTOLIN HFA) 108 (90 BASE) MCG/ACT inhaler Inhale 2 puffs into the lungs every 6 (six) hours as needed for wheezing.  1 Inhaler  3  . amLODipine (NORVASC) 5 MG tablet Take 1 tablet (5 mg total) by mouth 2 (two) times daily.  180 tablet  3  . aspirin 81 MG EC tablet Take 81 mg by mouth daily.        Marland Kitchen Fesoterodine Fumarate 8 MG TB24 Take 1 tablet (8 mg total) by mouth daily.  90 tablet  1  . furosemide (LASIX) 20 MG tablet Take 1 tablet (20 mg  total) by mouth every other day.  45 tablet  3  . gabapentin (NEURONTIN) 600 MG tablet Take 1 tablet (600 mg total) by mouth at bedtime.  90 tablet  3  . hydrALAZINE (APRESOLINE) 50 MG tablet Take 1.5 tablets (75 mg total) by mouth 3 (three) times daily.  150 tablet  0  . nebivolol (BYSTOLIC) 10 MG tablet Take 1 tablet (10 mg total) by mouth daily.  90 tablet  3  . omeprazole (PRILOSEC) 40 MG capsule Take 40 mg by mouth daily.       . Red Yeast Rice 600 MG CAPS Take 1 capsule by mouth daily.        Marland Kitchen tiotropium (SPIRIVA) 18 MCG inhalation capsule Place 1 capsule (18 mcg total) into inhaler and inhale daily.  90 capsule  3  . vitamin E 1000 UNIT capsule Take 1,000 Units by mouth daily.         The PMH, PSH, Social History, Family History, Medications, and allergies have been reviewed in [facility], and have been updated if relevant.   OBJECTIVE:  Appears well, in no apparent distress.  Vital signs are normal. The abdomen is soft without tenderness, guarding, mass, rebound or organomegaly. No CVA tenderness or inguinal adenopathy noted. Urine dipstick shows positive for leukocytes.    ASSESSMENT: UTI uncomplicated without evidence of pyelonephritis  PLAN: Treatment per orders - cipro 250 mg twice daily x 3 days, also push fluids, may use Pyridium OTC prn. Call or return to clinic prn if these symptoms worsen or fail to improve as anticipated.

## 2012-05-21 NOTE — Patient Instructions (Signed)
Take your cipro as directed- 1 tablet twice daily for 3 days. Drink plenty of water. If your symptoms are not better after you finish the cipro, please call us.

## 2012-05-25 LAB — URINE CULTURE

## 2012-06-03 ENCOUNTER — Other Ambulatory Visit: Payer: Self-pay

## 2012-06-03 MED ORDER — NEBIVOLOL HCL 10 MG PO TABS: 10.0000 mg | ORAL_TABLET | Freq: Every day | ORAL | Status: DC

## 2012-06-03 NOTE — Telephone Encounter (Signed)
Pt request refill Bystolic to [facility]. Notified pt while on phone refill done.

## 2012-06-04 ENCOUNTER — Other Ambulatory Visit: Payer: Self-pay

## 2012-06-04 NOTE — Telephone Encounter (Signed)
pt called [id number] Bystolic advised pt was sent yesterday to [facility] as discussed on phone 06/03/12.

## 2012-06-15 ENCOUNTER — Inpatient Hospital Stay: Payer: Self-pay | Admitting: Internal Medicine

## 2012-06-15 ENCOUNTER — Ambulatory Visit: Payer: Self-pay | Admitting: Oncology

## 2012-06-15 DIAGNOSIS — Z8619 Personal history of other infectious and parasitic diseases: Secondary | ICD-10-CM

## 2012-06-15 HISTORY — DX: Personal history of other infectious and parasitic diseases: Z86.19

## 2012-06-15 LAB — CBC WITH DIFFERENTIAL/PLATELET
Basophil #: 0.1 10*3/uL (ref 0.0–0.1)
HGB: 11.8 g/dL — ABNORMAL LOW (ref 12.0–16.0)
Lymphocyte #: 1.6 10*3/uL (ref 1.0–3.6)
Lymphocyte %: 7.7 %
MCH: 30.2 pg (ref 26.0–34.0)
MCHC: 33.7 g/dL (ref 32.0–36.0)
Monocyte %: 6.3 %
Neutrophil %: 85.4 %
Platelet: 236 10*3/uL (ref 150–440)
RDW: 15 % — ABNORMAL HIGH (ref 11.5–14.5)
WBC: 21.2 10*3/uL — ABNORMAL HIGH (ref 3.6–11.0)

## 2012-06-15 LAB — BASIC METABOLIC PANEL
Anion Gap: 11 (ref 7–16)
Calcium, Total: 9.4 mg/dL (ref 8.5–10.1)
Chloride: 102 mmol/L (ref 98–107)
EGFR (Non-African Amer.): 22 — ABNORMAL LOW
Glucose: 120 mg/dL — ABNORMAL HIGH (ref 65–99)
Osmolality: 288 (ref 275–301)
Potassium: 4.1 mmol/L (ref 3.5–5.1)

## 2012-06-15 LAB — URINALYSIS, COMPLETE
Bilirubin,UR: NEGATIVE
Blood: NEGATIVE
Glucose,UR: NEGATIVE mg/dL (ref 0–75)
Nitrite: NEGATIVE
Ph: 5 (ref 4.5–8.0)
RBC,UR: <1 /HPF (ref 0–5)
Squamous Epithelial: NONE SEEN

## 2012-06-15 LAB — HEPATIC FUNCTION PANEL A (ARMC)
Albumin: 3.6 g/dL (ref 3.4–5.0)
Bilirubin, Direct: <0.05 mg/dL (ref 0.00–0.20)
Bilirubin,Total: 0.5 mg/dL (ref 0.2–1.0)
Total Protein: 8.3 g/dL — ABNORMAL HIGH (ref 6.4–8.2)

## 2012-06-15 LAB — LIPASE, BLOOD: Lipase: 62 U/L — ABNORMAL LOW (ref 73–393)

## 2012-06-15 LAB — TROPONIN I: Troponin-I: <0.02 ng/mL

## 2012-06-16 LAB — COMPREHENSIVE METABOLIC PANEL
Albumin: 2.5 g/dL — ABNORMAL LOW (ref 3.4–5.0)
Alkaline Phosphatase: 82 U/L (ref 50–136)
Anion Gap: 8 (ref 7–16)
BUN: 31 mg/dL — ABNORMAL HIGH (ref 7–18)
Bilirubin,Total: 0.4 mg/dL (ref 0.2–1.0)
Calcium, Total: 8.2 mg/dL — ABNORMAL LOW (ref 8.5–10.1)
Co2: 23 mmol/L (ref 21–32)
EGFR (Non-African Amer.): 25 — ABNORMAL LOW
Glucose: 116 mg/dL — ABNORMAL HIGH (ref 65–99)
Osmolality: 291 (ref 275–301)
SGOT(AST): 18 U/L (ref 15–37)
Sodium: 142 mmol/L (ref 136–145)

## 2012-06-16 LAB — CBC WITH DIFFERENTIAL/PLATELET
Basophil #: 0.1 10*3/uL (ref 0.0–0.1)
Basophil %: 0.8 %
Eosinophil #: 0.2 10*3/uL (ref 0.0–0.7)
HCT: 30.4 % — ABNORMAL LOW (ref 35.0–47.0)
HGB: 10.1 g/dL — ABNORMAL LOW (ref 12.0–16.0)
Lymphocyte %: 9.6 %
MCH: 30.6 pg (ref 26.0–34.0)
MCHC: 33.4 g/dL (ref 32.0–36.0)
MCV: 92 fL (ref 80–100)
Neutrophil #: 9.4 10*3/uL — ABNORMAL HIGH (ref 1.4–6.5)
Platelet: 189 10*3/uL (ref 150–440)

## 2012-06-16 LAB — CLOSTRIDIUM DIFFICILE BY PCR

## 2012-06-17 ENCOUNTER — Telehealth: Payer: Self-pay | Admitting: Family Medicine

## 2012-06-17 LAB — CBC WITH DIFFERENTIAL/PLATELET
Basophil #: 0.1 10*3/uL (ref 0.0–0.1)
HCT: 32.5 % — ABNORMAL LOW (ref 35.0–47.0)
HGB: 10.8 g/dL — ABNORMAL LOW (ref 12.0–16.0)
Lymphocyte #: 2.1 10*3/uL (ref 1.0–3.6)
MCH: 30.4 pg (ref 26.0–34.0)
MCV: 92 fL (ref 80–100)
Monocyte #: 1.4 x10 3/mm — ABNORMAL HIGH (ref 0.2–0.9)
Monocyte %: 12.6 %
Platelet: 202 10*3/uL (ref 150–440)
RBC: 3.55 10*6/uL — ABNORMAL LOW (ref 3.80–5.20)
WBC: 10.9 10*3/uL (ref 3.6–11.0)

## 2012-06-17 LAB — BASIC METABOLIC PANEL
Calcium, Total: 8.8 mg/dL (ref 8.5–10.1)
Chloride: 111 mmol/L — ABNORMAL HIGH (ref 98–107)
Creatinine: 1.8 mg/dL — ABNORMAL HIGH (ref 0.60–1.30)
EGFR (African American): 28 — ABNORMAL LOW
EGFR (Non-African Amer.): 24 — ABNORMAL LOW

## 2012-06-17 LAB — TROPONIN I: Troponin-I: <0.02 ng/mL

## 2012-06-17 LAB — CK: CK, Total: 65 U/L (ref 21–215)

## 2012-06-17 NOTE — Telephone Encounter (Signed)
Triage Record Num: [id number] Operator: Candida Peeling Patient Name: Elizabeth Silva Call Date & Time: 06/14/2012 8:18:36PM Patient Phone: [phone] PCP: Kerby Nora Patient Gender: Female PCP Fax : [phone] Patient DOB: [birth year] Practice Name: [facility] Reason for Call: Caller: Sue/Other; PCP: Kerby Nora (Family Practice); CB#: [phone]; Call regarding Nausea, fever 100, painful bowel movement, possible dehydration, general weakness, headache; Caller is daughter. Onset 06/12/12 began having chills, could not eat on 06/13/12 and unable to eat 06/14/12 as well as dizziness, fever 100 oral on 06/14/12, pt complaining of feeling very weak. Some abdominal pain at time of stool. All emergent signs/symptoms ruled out per Nausea or Vomiting protocol with the exception of "Loss of appetite for 3 or more days or nausea for 7 or more days". Per nursing judgement RN instructed ED evaluation now. Caller will take to [facility]. Protocol(s) Used: Nausea or Vomiting Recommended Outcome per Protocol: See Provider within 72 Hours Override Outcome if Used in Protocol: See Provider within 4 hours RN Reason for Override Outcome: Nursing Judgement Used. Reason for Outcome: Loss of appetite for 3 or more days OR nausea for 7 or more days Care Advice: ~ Keep a diary of all food and drink that has been consumed until evaluated by provider. ~ Call provider if pain or vomiting develop or unable to eat or drink anything. ~ SYMPTOM / CONDITION MANAGEMENT ~ CAUTIONS ~ List, or take, all current prescription(s), nonprescription or alternative medication(s) to provider for evaluation. Nausea Care Advice: - Drink small amounts of clear, sweetened liquids or ice cold drinks. - Eat light, bland foods such as saltine crackers or plain bread. - Do not eat high fat, highly seasoned, high fiber, or high sugar content foods. - Avoid mixing hot food and cold foods. - Eat smaller, more  frequent meals. - Rest as much as possible in a sitting or in a propped lying position. Do not lie flat for at least 2 hours after eating. - Do not take pain medication (such as aspirin, NSAIDs) while nauseated. - Rest as much as possible until symptoms improve since activity may worsen nausea. ~ 06/14/2012 8:48:44PM Page 1 of 1 [id number]

## 2012-06-19 LAB — COMPREHENSIVE METABOLIC PANEL
Albumin: 2.6 g/dL — ABNORMAL LOW (ref 3.4–5.0)
Alkaline Phosphatase: 189 U/L — ABNORMAL HIGH (ref 50–136)
BUN: 27 mg/dL — ABNORMAL HIGH (ref 7–18)
Bilirubin,Total: 0.3 mg/dL (ref 0.2–1.0)
Calcium, Total: 8.4 mg/dL — ABNORMAL LOW (ref 8.5–10.1)
Chloride: 108 mmol/L — ABNORMAL HIGH (ref 98–107)
Creatinine: 1.84 mg/dL — ABNORMAL HIGH (ref 0.60–1.30)
EGFR (Non-African Amer.): 23 — ABNORMAL LOW
Glucose: 106 mg/dL — ABNORMAL HIGH (ref 65–99)
Osmolality: 285 (ref 275–301)
SGPT (ALT): 41 U/L (ref 12–78)
Total Protein: 5.8 g/dL — ABNORMAL LOW (ref 6.4–8.2)

## 2012-06-19 LAB — CBC WITH DIFFERENTIAL/PLATELET
Basophil #: 0.1 10*3/uL (ref 0.0–0.1)
Eosinophil #: 0.5 10*3/uL (ref 0.0–0.7)
Eosinophil %: 4.2 %
HCT: 30 % — ABNORMAL LOW (ref 35.0–47.0)
HGB: 9.5 g/dL — ABNORMAL LOW (ref 12.0–16.0)
Lymphocyte #: 1.3 10*3/uL (ref 1.0–3.6)
MCH: 29 pg (ref 26.0–34.0)
MCHC: 31.8 g/dL — ABNORMAL LOW (ref 32.0–36.0)
Monocyte #: 1.2 x10 3/mm — ABNORMAL HIGH (ref 0.2–0.9)
Monocyte %: 11 %
Neutrophil #: 8.2 10*3/uL — ABNORMAL HIGH (ref 1.4–6.5)
RDW: 14.7 % — ABNORMAL HIGH (ref 11.5–14.5)

## 2012-06-21 LAB — CULTURE, BLOOD (SINGLE)

## 2012-06-24 ENCOUNTER — Ambulatory Visit (INDEPENDENT_AMBULATORY_CARE_PROVIDER_SITE_OTHER): Payer: Medicare Other | Admitting: Cardiovascular Disease

## 2012-06-24 ENCOUNTER — Encounter: Payer: Self-pay | Admitting: Cardiovascular Disease

## 2012-06-24 VITALS — BP 134/52 | HR 66 | Ht 63.0 in | Wt 132.2 lb

## 2012-06-24 DIAGNOSIS — I10 Essential (primary) hypertension: Secondary | ICD-10-CM

## 2012-06-24 DIAGNOSIS — I872 Venous insufficiency (chronic) (peripheral): Secondary | ICD-10-CM

## 2012-06-24 DIAGNOSIS — I5032 Chronic diastolic (congestive) heart failure: Secondary | ICD-10-CM

## 2012-06-24 DIAGNOSIS — E7800 Pure hypercholesterolemia, unspecified: Secondary | ICD-10-CM

## 2012-06-24 NOTE — Assessment & Plan Note (Signed)
Trace edema of the right lower extremity. It suggested she only takes Lasix as needed for worsening edema or weight gain.

## 2012-06-24 NOTE — Patient Instructions (Addendum)
You are doing well.  Decrease the dose of the hydralazine to one pill three times a day If blood pressure contrinues to runs low, cut the pill in 1/2   Hold the lasix until your weight increases 3 pounds or you get worsening edema  Please call us if you have new issues that need to be addressed before your next appt.  Your physician wants you to follow-up in: 3 months.  You will receive a reminder letter in the mail two months in advance. If you don't receive a letter, please call our office to schedule the follow-up appointment.

## 2012-06-24 NOTE — Progress Notes (Signed)
Patient ID: Elizabeth Silva, female    DOB: 1918-04-09, 78 y.o.   MRN: [id number]  HPI Comments: 78 yo with history of difficult to control HTN, COPD, hyperlipidemia, left bundle-branch block, moderate tricuspid valve regurgitation with normal ejection fraction in 2012, presents today for routine followup.     She reports having recent hospital admission for sepsis, recurrent pneumonia, diagnosed with C. Difficile. She has been treated with Flagyl and diarrhea has improved. She has been holding her Lasix over the past week she has no edema, no shortness of breath, oral intake has been low.   Notes from the hospital indicate Klebsiella urinary tract infection treated with antibiotics, C. difficile improving on Flagyl, bradycardia, recurrent pneumonia that was improving on antibiotics, sepsis that was treated and improved, chronic kidney disease.  Her energy is improving and she is walking with a cane    Previous  Echo was suggestive of diastolic dysfunction with normal EF (60-65%). Moderate TR      EKG shows normal sinus rhythm with left bundle branch block, rate 66 beats per minute      Outpatient Encounter Prescriptions as of 06/24/2012  Medication Sig Dispense Refill  . acetaminophen (TYLENOL) 500 MG tablet Take 500 mg by mouth every 6 (six) hours as needed.        Marland Kitchen albuterol (PROVENTIL HFA;VENTOLIN HFA) 108 (90 BASE) MCG/ACT inhaler Inhale 2 puffs into the lungs every 6 (six) hours as needed for wheezing.  1 Inhaler  3  . amLODipine (NORVASC) 5 MG tablet Take 5 mg by mouth daily.      Marland Kitchen aspirin 81 MG EC tablet Take 81 mg by mouth daily.        . cefUROXime (CEFTIN) 500 MG tablet Take 500 mg by mouth 2 (two) times daily.      Marland Kitchen Fesoterodine Fumarate 8 MG TB24 Take 1 tablet (8 mg total) by mouth daily.  90 tablet  1  . gabapentin (NEURONTIN) 600 MG tablet Take 1 tablet (600 mg total) by mouth at bedtime.  90 tablet  3  . guaiFENesin (MUCINEX) 600 MG 12 hr tablet Take 1,200 mg by mouth 2 (two)  times daily.      . hydrALAZINE (APRESOLINE) 50 MG tablet Take 1.5 tablets (75 mg total) by mouth 3 (three) times daily.  150 tablet  0  . metroNIDAZOLE (FLAGYL) 500 MG tablet Take 500 mg by mouth every 8 (eight) hours.      . nebivolol (BYSTOLIC) 10 MG tablet Take 1 tablet (10 mg total) by mouth daily.  90 tablet  1  . omeprazole (PRILOSEC) 40 MG capsule Take 40 mg by mouth daily.       . Red Yeast Rice 600 MG CAPS Take 1 capsule by mouth daily.        Marland Kitchen tiotropium (SPIRIVA) 18 MCG inhalation capsule Place 1 capsule (18 mcg total) into inhaler and inhale daily.  90 capsule  3  . vitamin E 1000 UNIT capsule Take 1,000 Units by mouth daily.        Marland Kitchen DISCONTD: amLODipine (NORVASC) 5 MG tablet Take 1 tablet (5 mg total) by mouth 2 (two) times daily.  180 tablet  3  . furosemide (LASIX) 20 MG tablet Take 20 mg by mouth daily as needed.        Review of Systems  Constitutional: Negative.   HENT: Negative.   Eyes: Negative.   Respiratory: Negative.   Cardiovascular: Negative.   Gastrointestinal: Negative.   Musculoskeletal: Positive  for gait problem.  Skin: Negative.   Neurological: Negative.   Hematological: Negative.   Psychiatric/Behavioral: Negative.   All other systems reviewed and are negative.   BP 134/52  Pulse 66  Ht 5\' 3"  (1.6 m)  Wt 132 lb 4 oz (59.988 kg)  BMI 23.43 kg/m2  Physical Exam  Nursing note and vitals reviewed. Constitutional: She is oriented to person, place, and time. She appears well-developed and well-nourished.  HENT:  Head: Normocephalic.  Nose: Nose normal.  Mouth/Throat: Oropharynx is clear and moist.  Eyes: Conjunctivae are normal. Pupils are equal, round, and reactive to light.  Neck: Normal range of motion. Neck supple. No JVD present. Carotid bruit is present.  Cardiovascular: Normal rate, regular rhythm, S1 normal, S2 normal and intact distal pulses.  Exam reveals no gallop and no friction rub.   Murmur heard.  Crescendo systolic murmur is  present with a grade of 2/6  Pulmonary/Chest: Effort normal and breath sounds normal. No respiratory distress. She has no wheezes. She has no rales. She exhibits no tenderness.  Abdominal: Soft. Bowel sounds are normal. She exhibits no distension. There is no tenderness.  Musculoskeletal: Normal range of motion. She exhibits no edema and no tenderness.  Lymphadenopathy:    She has no cervical adenopathy.  Neurological: She is alert and oriented to person, place, and time. Coordination normal.  Skin: Skin is warm and dry. No rash noted. No erythema.  Psychiatric: She has a normal mood and affect. Her behavior is normal. Judgment and thought content normal.         Assessment and Plan

## 2012-06-24 NOTE — Assessment & Plan Note (Signed)
Previously she was on Lasix 40 mg daily in March 2013. Currently she has not been taking Lasix as she is recovering from C. difficile, continues to have frequent bowel movements. Yesterday had 5-6 bowel movements. She has minimal edema of the right lower extremity, 8 pound weight loss since her last clinic visit.

## 2012-06-24 NOTE — Assessment & Plan Note (Signed)
Cholesterol is above goal. We will discuss this with her on her next clinic visit. We'll not add any new medications given her diarrhea.

## 2012-06-24 NOTE — Assessment & Plan Note (Signed)
She reports blood pressure was low yesterday. Recent weight loss. We have suggested she decrease her hydralazine to 1 tab 3 times a day.

## 2012-06-30 ENCOUNTER — Encounter: Payer: Self-pay | Admitting: Family Medicine

## 2012-07-02 ENCOUNTER — Telehealth: Payer: Self-pay

## 2012-07-02 NOTE — Telephone Encounter (Signed)
[facility] left v/m; pt finished antibiotic 06/30/12 for c diff. Does pt need repeat stool test or a f/u appt. Pt is not having any symptoms.Call pt or pt's daughter at [phone].Please advise.

## 2012-07-03 ENCOUNTER — Ambulatory Visit: Payer: Self-pay | Admitting: Oncology

## 2012-07-03 LAB — CREATININE, SERUM
EGFR (African American): 29 — ABNORMAL LOW
EGFR (Non-African Amer.): 25 — ABNORMAL LOW

## 2012-07-03 NOTE — Telephone Encounter (Signed)
If feeling well no follow up needed. No repeat stool test needed. NTest is not helpful because it ca remain positive on test once gone even for a while.

## 2012-07-03 NOTE — Telephone Encounter (Signed)
Caller: Sue/Child; Patient Name: Elizabeth Silva; PCP: Kerby Nora (Family Practice); Best Callback Phone Number: [phone] Daughter calling to get the answer to questions that home health nurse called for on yesterday. She needs to know if her mom needs a follow up appointment from her hospital visit and if she needs f/u stool testing. She denies that her mom is having any problems at this time to triage. Daughter informed per [facility] Dr. Ermalene Searing response is no follow up needed if feeling well. No repeat stool test needed. NTest is not helpful because it can remain positive on test once gone for awhile.

## 2012-07-04 NOTE — Telephone Encounter (Signed)
Patient notified

## 2012-07-08 ENCOUNTER — Ambulatory Visit (INDEPENDENT_AMBULATORY_CARE_PROVIDER_SITE_OTHER): Payer: Medicare Other

## 2012-07-08 DIAGNOSIS — Z23 Encounter for immunization: Secondary | ICD-10-CM

## 2012-07-09 ENCOUNTER — Telehealth: Payer: Self-pay

## 2012-07-09 NOTE — Telephone Encounter (Signed)
[facility] called and wanted a verbal order for 1 more week of therapy. Patient is being released tomorrow to go home and he wants to follow up with home visit to see how she does.

## 2012-07-09 NOTE — Telephone Encounter (Signed)
Please give verbal order for 1 week more therapy.

## 2012-07-10 NOTE — Telephone Encounter (Signed)
Verbal order given to jason at [facility] for 1 more week of treatment.

## 2012-07-11 DIAGNOSIS — B961 Klebsiella pneumoniae [K. pneumoniae] as the cause of diseases classified elsewhere: Secondary | ICD-10-CM

## 2012-07-11 DIAGNOSIS — J189 Pneumonia, unspecified organism: Secondary | ICD-10-CM

## 2012-07-11 DIAGNOSIS — A0472 Enterocolitis due to Clostridium difficile, not specified as recurrent: Secondary | ICD-10-CM

## 2012-07-11 DIAGNOSIS — N390 Urinary tract infection, site not specified: Secondary | ICD-10-CM

## 2012-07-14 ENCOUNTER — Other Ambulatory Visit: Payer: Self-pay | Admitting: Family Medicine

## 2012-07-14 NOTE — Telephone Encounter (Signed)
The pt called and is hoping to get a refill of prilosec 40mg 

## 2012-07-15 ENCOUNTER — Ambulatory Visit: Payer: Self-pay | Admitting: Oncology

## 2012-07-15 MED ORDER — OMEPRAZOLE 40 MG PO CPDR: 40.0000 mg | DELAYED_RELEASE_CAPSULE | Freq: Every day | ORAL | Status: DC

## 2012-07-15 NOTE — Telephone Encounter (Signed)
Patient notified as instructed by telephone Prilosec refilled.

## 2012-07-17 ENCOUNTER — Telehealth: Payer: Self-pay | Admitting: Family Medicine

## 2012-07-17 NOTE — Telephone Encounter (Signed)
Caller: Jamita/Patient; Patient Name: Elizabeth Silva; PCP: Kerby Nora (Family Practice); Best Callback Phone Number: [phone] Was hospitalized for "1 month" and spent 3 weeks recuperating at daughter's home. Is back home and wants to know if MD wants to see her or should she follow up with gastro intestinal doctor. States Dr. Ermalene Searing advised did not need follow up when was discharged from hospital. States " felt sick" after eating 10-2 and then had bowel movement. Stool was not liquid. Pain in abdomen is intermittent and does not interfere with usual activiti3es. Is driinking fluids. Afebrile. Per Abdominal Pain protocol, appointment scheduled for 10-4 at 0945 due to intermittent abdominal pain and intermittent nausea.

## 2012-07-18 ENCOUNTER — Ambulatory Visit (INDEPENDENT_AMBULATORY_CARE_PROVIDER_SITE_OTHER): Payer: Medicare Other | Admitting: Family Medicine

## 2012-07-18 ENCOUNTER — Encounter: Payer: Self-pay | Admitting: Family Medicine

## 2012-07-18 VITALS — BP 140/60 | HR 66 | Temp 98.2°F | Wt 131.0 lb

## 2012-07-18 DIAGNOSIS — B3731 Acute candidiasis of vulva and vagina: Secondary | ICD-10-CM

## 2012-07-18 DIAGNOSIS — R9389 Abnormal findings on diagnostic imaging of other specified body structures: Secondary | ICD-10-CM | POA: Insufficient documentation

## 2012-07-18 DIAGNOSIS — R1030 Lower abdominal pain, unspecified: Secondary | ICD-10-CM | POA: Insufficient documentation

## 2012-07-18 DIAGNOSIS — R109 Unspecified abdominal pain: Secondary | ICD-10-CM

## 2012-07-18 DIAGNOSIS — B373 Candidiasis of vulva and vagina: Secondary | ICD-10-CM

## 2012-07-18 LAB — POCT UA - MICROSCOPIC ONLY

## 2012-07-18 LAB — POCT URINALYSIS DIPSTICK
Bilirubin, UA: NEGATIVE
Blood, UA: NEGATIVE
Nitrite, UA: NEGATIVE
Urobilinogen, UA: NEGATIVE
pH, UA: 6.5

## 2012-07-18 MED ORDER — CEFUROXIME AXETIL 500 MG PO TABS: 500.0000 mg | ORAL_TABLET | Freq: Two times a day (BID) | ORAL | Status: DC

## 2012-07-18 MED ORDER — FLUCONAZOLE 150 MG PO TABS: 150.0000 mg | ORAL_TABLET | Freq: Once | ORAL | Status: DC

## 2012-07-18 NOTE — Assessment & Plan Note (Addendum)
Likely following recent antibiotics.  Will treat with diflucan once current antibiotics completed.

## 2012-07-18 NOTE — Progress Notes (Signed)
Subjective:    Patient ID: Elizabeth Silva, female    DOB: September 01, 1918, 78 y.o.   MRN: [id number]  HPI  78 year old female presents following admission 8/31-9/5 for Cdiff, PNA and UTI  She stayed at daughters house for 3 weeks. Completed antibiotics (flagyl TID and ceftin BID) on 06/30/12. Antibitoics made her feel nauseous. Her diarrhea resolved completely.  She reports now in last 2 days...sudden onset of nausea and bloating, relieved by BM. BP was 185/ 65. Vaginal burning radiating up to mid abdomen. No vaginal discharge, occ vaginal itching and uncomfortable feeling.  Went to bed. By the next morning abdominal pain resolved but some lightheaded yesterday.  Today she feels better overall. No dysuria, no diarrhea, no blood in stool, no vomiting. No fever.   HTN: saw Dr. Mariah Milling recently for low BPs... Had decreased hydralazine in hospoital, but has now gone back to nml dose of hydralazine in last 2 weeks. BP had been around 140 systolic usually.  Had been in middle of work up of Chest CT  In July... ? Lung mass. Pet scan with Dr. Lavell Islam showed more likely PNA not cancer. Reviewed 06/06/12 OV note.  Given recurrent pneumona this past month.. Unclear cause... Has Ct chest repeat scheduled next week. Then will have follow up with Dr. Lavell Islam next week as well.    Review of Systems  Constitutional: Positive for fatigue. Negative for fever.  HENT: Negative for congestion.   Eyes: Negative for pain.  Respiratory: Negative for shortness of breath.   Cardiovascular: Negative for chest pain.  Gastrointestinal: Negative for abdominal pain.       Objective:   Physical Exam  Constitutional: Vital signs are normal. She appears well-developed and well-nourished. She is cooperative.  Non-toxic appearance. She does not appear ill. No distress.  HENT:  Head: Normocephalic.  Right Ear: Hearing, tympanic membrane, external ear and ear canal normal. Tympanic membrane is not erythematous, not  retracted and not bulging.  Left Ear: Hearing, tympanic membrane, external ear and ear canal normal. Tympanic membrane is not erythematous, not retracted and not bulging.  Nose: No mucosal edema or rhinorrhea. Right sinus exhibits no maxillary sinus tenderness and no frontal sinus tenderness. Left sinus exhibits no maxillary sinus tenderness and no frontal sinus tenderness.  Mouth/Throat: Uvula is midline, oropharynx is clear and moist and mucous membranes are normal.  Eyes: Conjunctivae normal, EOM and lids are normal. Pupils are equal, round, and reactive to light. No foreign bodies found.  Neck: Trachea normal and normal range of motion. Neck supple. Carotid bruit is not present. No mass and no thyromegaly present.  Cardiovascular: Normal rate, regular rhythm, S1 normal, S2 normal, normal heart sounds, intact distal pulses and normal pulses.  Exam reveals no gallop and no friction rub.   No murmur heard. Pulmonary/Chest: Effort normal and breath sounds normal. Not tachypneic. No respiratory distress. She has no decreased breath sounds. She has no wheezes. She has no rhonchi. She has no rales.  Abdominal: Soft. Normal appearance and bowel sounds are normal. There is no hepatosplenomegaly. There is tenderness in the right lower quadrant. There is no rebound and no CVA tenderness.       Mild rlQ on palpation  Neurological: She is alert.  Skin: Skin is warm, dry and intact. No rash noted.  Psychiatric: Her speech is normal and behavior is normal. Judgment and thought content normal. Her mood appears not anxious. Cognition and memory are normal. She does not exhibit a depressed mood.  Assessment & Plan:

## 2012-07-18 NOTE — Assessment & Plan Note (Addendum)
No diarrhea, doubt recurrence of cdiff. Pain relieved with nml BM... ? Gas. Minimal pain at this time.  UA:  tntc wbc.. Send for culture. Start on ceftin x 10 days.  Follow up in 2 week... Recheck UA at that time.

## 2012-07-18 NOTE — Telephone Encounter (Signed)
Agree with followup.

## 2012-07-18 NOTE — Assessment & Plan Note (Signed)
Reviewed Dr. Hyman Bible note. Looks like thinking most likely not cancer, but unclear why she is getting recurrent pneumonia. Has CT repeat next week.

## 2012-07-18 NOTE — Patient Instructions (Addendum)
We will call with urine culture results. Start ceftin twice daily for urinary tract infection. Start topical over the counter yeast cream such as vagisil. Once antibiotics done take fluconazole yeast prescription tablet. Call if abdominal pain returns.

## 2012-07-21 LAB — URINE CULTURE

## 2012-07-22 ENCOUNTER — Telehealth: Payer: Self-pay | Admitting: Family Medicine

## 2012-07-22 MED ORDER — SULFAMETHOXAZOLE-TRIMETHOPRIM 800-160 MG PO TABS: 1.0000 | ORAL_TABLET | Freq: Two times a day (BID) | ORAL | Status: DC

## 2012-07-22 NOTE — Telephone Encounter (Signed)
Pt advised of results and that new med was called in to her pharmacy.

## 2012-07-22 NOTE — Telephone Encounter (Signed)
Notify pt that urine culture shows infection.. Not sensitive to ceftin as she is on now and was on in hospital.  She has had SE in past to fluroquinolones  Will have her stop ceftin and change to sulfa tmp x 10 days.

## 2012-07-28 ENCOUNTER — Other Ambulatory Visit: Payer: Self-pay

## 2012-07-28 MED ORDER — FESOTERODINE FUMARATE ER 8 MG PO TB24: 8.0000 mg | ORAL_TABLET | Freq: Every day | ORAL | Status: DC

## 2012-07-28 NOTE — Telephone Encounter (Signed)
Pt request refill toviaz [facility]. Pt notified done.

## 2012-08-05 ENCOUNTER — Ambulatory Visit: Payer: Medicare Other | Admitting: Family Medicine

## 2012-08-12 ENCOUNTER — Ambulatory Visit (INDEPENDENT_AMBULATORY_CARE_PROVIDER_SITE_OTHER): Payer: Medicare Other | Admitting: Family Medicine

## 2012-08-12 ENCOUNTER — Encounter: Payer: Self-pay | Admitting: Family Medicine

## 2012-08-12 VITALS — BP 130/76 | HR 64 | Temp 98.2°F | Wt 132.5 lb

## 2012-08-12 DIAGNOSIS — M6740 Ganglion, unspecified site: Secondary | ICD-10-CM

## 2012-08-12 DIAGNOSIS — N390 Urinary tract infection, site not specified: Secondary | ICD-10-CM | POA: Insufficient documentation

## 2012-08-12 DIAGNOSIS — R319 Hematuria, unspecified: Secondary | ICD-10-CM

## 2012-08-12 LAB — POCT URINALYSIS DIPSTICK
Glucose, UA: NEGATIVE
Ketones, UA: NEGATIVE
Urobilinogen, UA: 0.2

## 2012-08-12 NOTE — Assessment & Plan Note (Signed)
Pt reassured No treatment necessary unless pain, and size increase.

## 2012-08-12 NOTE — Progress Notes (Signed)
  Subjective:    Patient ID: Elizabeth Silva, female    DOB: 1918/10/02, 78 y.o.   MRN: [id number]  HPI  78 year old female with recurrent UTI presents for follow up UTI.  All symptoms have resolved.  No dysuria, no abdominal pain  She has noted swelling on plantar as[ect of left wrist, nontender, no redness, mobile. No fever, no pain in wrist joint. Review of Systems  Respiratory: Negative for shortness of breath.   Cardiovascular: Negative for chest pain.  Gastrointestinal: Negative for abdominal pain.  Genitourinary: Negative for dysuria, frequency, hematuria, flank pain and difficulty urinating.       Objective:   Physical Exam  Constitutional: She is oriented to person, place, and time. She appears well-developed.  Cardiovascular: Normal rate and regular rhythm.   Pulmonary/Chest: Effort normal and breath sounds normal.  Abdominal: Soft. Bowel sounds are normal. There is no tenderness.  Neurological: She is alert and oriented to person, place, and time.  Skin:       Left plantar aspect of wrist... 1 cm ganglion cyst          Assessment & Plan:

## 2012-08-12 NOTE — Assessment & Plan Note (Signed)
And hematuria resolved.

## 2012-08-15 ENCOUNTER — Ambulatory Visit: Payer: Self-pay | Admitting: Oncology

## 2012-09-24 ENCOUNTER — Ambulatory Visit (INDEPENDENT_AMBULATORY_CARE_PROVIDER_SITE_OTHER): Payer: Medicare Other | Admitting: Cardiovascular Disease

## 2012-09-24 ENCOUNTER — Encounter: Payer: Self-pay | Admitting: Cardiovascular Disease

## 2012-09-24 VITALS — BP 138/50 | HR 67 | Ht 61.0 in | Wt 134.5 lb

## 2012-09-24 DIAGNOSIS — I5032 Chronic diastolic (congestive) heart failure: Secondary | ICD-10-CM

## 2012-09-24 DIAGNOSIS — R0602 Shortness of breath: Secondary | ICD-10-CM

## 2012-09-24 DIAGNOSIS — I872 Venous insufficiency (chronic) (peripheral): Secondary | ICD-10-CM

## 2012-09-24 DIAGNOSIS — I10 Essential (primary) hypertension: Secondary | ICD-10-CM

## 2012-09-24 NOTE — Assessment & Plan Note (Signed)
No clinical signs of heart failure. Not taking Lasix at this time. No further medication changes

## 2012-09-24 NOTE — Assessment & Plan Note (Signed)
Stable, wears compression hose.

## 2012-09-24 NOTE — Assessment & Plan Note (Signed)
Blood pressure is well controlled on today's visit. No changes made to the medications. 

## 2012-09-24 NOTE — Patient Instructions (Addendum)
You are doing well. No medication changes were made.  Please call us if you have new issues that need to be addressed before your next appt.  Your physician wants you to follow-up in: 6 months.  You will receive a reminder letter in the mail two months in advance. If you don't receive a letter, please call our office to schedule the follow-up appointment.   

## 2012-09-24 NOTE — Progress Notes (Signed)
Patient ID: Elizabeth Silva, female    DOB: [birth year], 78 y.o.   MRN: [id number]  HPI Comments: 78 yo with history of difficult to control HTN, COPD, hyperlipidemia, left bundle-branch block, moderate tricuspid valve regurgitation with normal ejection fraction in 2012, presents today for routine followup.     Previous hospital admission for sepsis, recurrent pneumonia, diagnosed with C. Difficile.  treated with Flagyl and diarrhea improved. Notes from the hospital indicate Klebsiella urinary tract infection treated with antibiotics, C. difficile improving on Flagyl, bradycardia, recurrent pneumonia that was improving on antibiotics, sepsis that was treated and improved, chronic kidney disease.   She reports that overall she is doing well. Her gait is somewhat unsteady. She does not want to use a cane. No significant lower extremity edema. She's not taking Lasix.    Previous  Echo was suggestive of diastolic dysfunction with normal EF (60-65%). Moderate TR      EKG shows normal sinus rhythm with left bundle branch block, rate 67 beats per minute      Outpatient Encounter Prescriptions as of 09/24/2012  Medication Sig Dispense Refill  . acetaminophen (TYLENOL) 500 MG tablet Take 500 mg by mouth every 6 (six) hours as needed.        Marland Kitchen albuterol (PROVENTIL HFA;VENTOLIN HFA) 108 (90 BASE) MCG/ACT inhaler Inhale 2 puffs into the lungs every 6 (six) hours as needed for wheezing.  1 Inhaler  3  . amLODipine (NORVASC) 5 MG tablet Take 5 mg by mouth daily.      Marland Kitchen aspirin 81 MG EC tablet Take 81 mg by mouth daily.        . fesoterodine (TOVIAZ) 8 MG TB24 Take 1 tablet (8 mg total) by mouth daily.  90 tablet  1  . furosemide (LASIX) 20 MG tablet Take 20 mg by mouth daily as needed.      . gabapentin (NEURONTIN) 600 MG tablet Take 1 tablet (600 mg total) by mouth at bedtime.  90 tablet  3  . hydrALAZINE (APRESOLINE) 50 MG tablet Take 1.5 tablets (75 mg total) by mouth 3 (three) times daily.  150 tablet  0   . nebivolol (BYSTOLIC) 10 MG tablet Take 1 tablet (10 mg total) by mouth daily.  90 tablet  1  . omeprazole (PRILOSEC) 40 MG capsule Take 1 capsule (40 mg total) by mouth daily.  30 capsule  0  . Red Yeast Rice 600 MG CAPS Take 1 capsule by mouth daily.        Marland Kitchen tiotropium (SPIRIVA) 18 MCG inhalation capsule Place 1 capsule (18 mcg total) into inhaler and inhale daily.  90 capsule  3  . vitamin E 1000 UNIT capsule Take 1,000 Units by mouth daily.           Review of Systems  Constitutional: Negative.   HENT: Negative.   Eyes: Negative.   Respiratory: Negative.   Cardiovascular: Negative.   Gastrointestinal: Negative.   Musculoskeletal: Positive for gait problem.  Skin: Negative.   Neurological: Negative.   Hematological: Negative.   Psychiatric/Behavioral: Negative.   All other systems reviewed and are negative.   BP 138/50  Pulse 67  Ht 5\' 1"  (1.549 m)  Wt 134 lb 8 oz (61.009 kg)  BMI 25.41 kg/m2  Physical Exam  Nursing note and vitals reviewed. Constitutional: She is oriented to person, place, and time. She appears well-developed and well-nourished.  HENT:  Head: Normocephalic.  Nose: Nose normal.  Mouth/Throat: Oropharynx is clear and moist.  Eyes: Conjunctivae  normal are normal. Pupils are equal, round, and reactive to light.  Neck: Normal range of motion. Neck supple. No JVD present. Carotid bruit is present.  Cardiovascular: Normal rate, regular rhythm, S1 normal, S2 normal and intact distal pulses.  Exam reveals no gallop and no friction rub.   Murmur heard.  Crescendo systolic murmur is present with a grade of 2/6  Pulmonary/Chest: Effort normal and breath sounds normal. No respiratory distress. She has no wheezes. She has no rales. She exhibits no tenderness.  Abdominal: Soft. Bowel sounds are normal. She exhibits no distension. There is no tenderness.  Musculoskeletal: Normal range of motion. She exhibits no edema and no tenderness.  Lymphadenopathy:    She  has no cervical adenopathy.  Neurological: She is alert and oriented to person, place, and time. Coordination normal.  Skin: Skin is warm and dry. No rash noted. No erythema.  Psychiatric: She has a normal mood and affect. Her behavior is normal. Judgment and thought content normal.         Assessment and Plan

## 2012-10-15 ENCOUNTER — Ambulatory Visit: Payer: Self-pay | Admitting: Oncology

## 2012-10-24 ENCOUNTER — Other Ambulatory Visit: Payer: Self-pay | Admitting: Family Medicine

## 2012-10-24 ENCOUNTER — Other Ambulatory Visit: Payer: Self-pay

## 2012-10-24 MED ORDER — NEBIVOLOL HCL 10 MG PO TABS: 10.0000 mg | ORAL_TABLET | Freq: Every day | ORAL | Status: DC

## 2012-10-24 MED ORDER — AMLODIPINE BESYLATE 5 MG PO TABS: 5.0000 mg | ORAL_TABLET | Freq: Two times a day (BID) | ORAL | Status: DC

## 2012-10-24 MED ORDER — FESOTERODINE FUMARATE ER 8 MG PO TB24: 8.0000 mg | ORAL_TABLET | Freq: Every day | ORAL | Status: DC

## 2012-10-24 NOTE — Telephone Encounter (Signed)
Pt is calling with a question about Amlodipine.  Pt states she called today to get a copy of her RX for her medication.  The RX written was for patient to take 1 tablet by mouth once a day.  The patient states the bottle she has a home (filled July 2013) has directions to take 1 tablet by mouth twice a day.  The patient states she has been taking one tablet after breakfast. Then she is taking her BP in the am and if it is elevated she is taking a second tablet before bedtime.  Pt states this is what she was told to do by Dr Osa Craver.  OFFICE COULD YOU PLEASE CLARIFY HOW DR Ermalene Searing WANTS PT TO TAKE THE MEDICATION.

## 2012-10-24 NOTE — Telephone Encounter (Signed)
Pt request written rx for amlodipine,toviaz and bystolic; pt has changed insurance companies and does not know name of new mail order pharmacy.call when ready for pick up.

## 2012-10-24 NOTE — Telephone Encounter (Signed)
Reviewed Dr, Ethelene Hal last note. As long as BP has been well controlled as it was at his 12/11 OV... She should continue to do what she has been doing. Change med list to reflect this.

## 2012-10-27 ENCOUNTER — Other Ambulatory Visit: Payer: Self-pay | Admitting: *Deleted

## 2012-10-27 ENCOUNTER — Ambulatory Visit: Payer: Self-pay | Admitting: Oncology

## 2012-10-27 MED ORDER — FESOTERODINE FUMARATE ER 8 MG PO TB24: 8.0000 mg | ORAL_TABLET | Freq: Every day | ORAL | Status: DC

## 2012-10-27 NOTE — Telephone Encounter (Signed)
Caller: Elizabeth Silva/Patient; Phone: [phone]; Reason for Call: Patient has switched insurance companies and needs new prescriptions for a new mail order pharmacy.  She needs hand written prescriptions and she will be picking them up.  She also needs a 90 day supply for these prescription.  Medications: Bystolic 10mg  daily, Toviaz 8mg  daily, Amlodipine 5mg  daily.  Please call patient when the prescriptions are ready to be picked up.

## 2012-10-27 NOTE — Telephone Encounter (Signed)
Patient advised.

## 2012-10-28 ENCOUNTER — Telehealth: Payer: Self-pay | Admitting: Family Medicine

## 2012-10-28 NOTE — Telephone Encounter (Signed)
Patient advised via message on machine 

## 2012-10-28 NOTE — Telephone Encounter (Signed)
Caller: Elizabeth Silva/Patient; Phone: [phone]; Reason for Call: Called to ask if three Rx are ready for pick up today, 10/28/12, since daughter will be coming to pick them up.  Please call back to advise.

## 2012-10-30 LAB — CBC CANCER CENTER
Basophil #: 0 x10 3/mm (ref 0.0–0.1)
Basophil %: 0.3 %
Eosinophil #: 0 x10 3/mm (ref 0.0–0.7)
Eosinophil %: 0.1 %
HCT: 36.9 % (ref 35.0–47.0)
Lymphocyte #: 1.6 x10 3/mm (ref 1.0–3.6)
MCH: 30.3 pg (ref 26.0–34.0)
MCV: 92 fL (ref 80–100)
Monocyte %: 9.4 %
Neutrophil %: 76.2 %

## 2012-10-30 LAB — COMPREHENSIVE METABOLIC PANEL
Albumin: 3.7 g/dL (ref 3.4–5.0)
Anion Gap: 11 (ref 7–16)
Calcium, Total: 8.4 mg/dL — ABNORMAL LOW (ref 8.5–10.1)
Co2: 22 mmol/L (ref 21–32)
Glucose: 71 mg/dL (ref 65–99)
Osmolality: 287 (ref 275–301)
Potassium: 4.7 mmol/L (ref 3.5–5.1)
SGOT(AST): 19 U/L (ref 15–37)
SGPT (ALT): 21 U/L (ref 12–78)
Sodium: 138 mmol/L (ref 136–145)

## 2012-10-31 ENCOUNTER — Telehealth: Payer: Self-pay | Admitting: Family Medicine

## 2012-10-31 NOTE — Telephone Encounter (Signed)
Patient calling asking for a Rx for her Norvasc  to be sent to [facility] [phone]. She states she just started with a new mail order pharmacy [facility] and mailed off her new prescription on Tuesday 10/28/12. She is afraid she will run out before the new prescription arrive. Reviewed Epic. Norvasc 5mg  bid po sent to her [facility] pharmacy by Dr. Karie Georges on 10/24/12.  Called pharmacy and verified patient had Rx there.  Explained to patient she has active Rx at home pharmacy. Patient will contact pharmacy to purchase medication until mail order arrives.  Understanding expressed.

## 2012-11-15 ENCOUNTER — Ambulatory Visit: Payer: Self-pay | Admitting: Oncology

## 2012-12-25 ENCOUNTER — Telehealth: Payer: Self-pay | Admitting: Family Medicine

## 2012-12-25 ENCOUNTER — Other Ambulatory Visit (INDEPENDENT_AMBULATORY_CARE_PROVIDER_SITE_OTHER): Payer: Medicare Other

## 2012-12-25 DIAGNOSIS — E7800 Pure hypercholesterolemia, unspecified: Secondary | ICD-10-CM

## 2012-12-25 DIAGNOSIS — I10 Essential (primary) hypertension: Secondary | ICD-10-CM

## 2012-12-25 DIAGNOSIS — N039 Chronic nephritic syndrome with unspecified morphologic changes: Secondary | ICD-10-CM

## 2012-12-25 DIAGNOSIS — D631 Anemia in chronic kidney disease: Secondary | ICD-10-CM

## 2012-12-25 LAB — CBC WITH DIFFERENTIAL/PLATELET
Basophils Absolute: 0.1 10*3/uL (ref 0.0–0.1)
Eosinophils Absolute: 0.4 10*3/uL (ref 0.0–0.7)
HCT: 33.3 % — ABNORMAL LOW (ref 36.0–46.0)
Hemoglobin: 10.9 g/dL — ABNORMAL LOW (ref 12.0–15.0)
Lymphocytes Relative: 21.4 % (ref 12.0–46.0)
Lymphs Abs: 1.6 10*3/uL (ref 0.7–4.0)
MCHC: 32.9 g/dL (ref 30.0–36.0)
Neutro Abs: 4.7 10*3/uL (ref 1.4–7.7)
Platelets: 242 10*3/uL (ref 150.0–400.0)
RDW: 15.4 % — ABNORMAL HIGH (ref 11.5–14.6)

## 2012-12-25 LAB — LIPID PANEL
LDL Cholesterol: 108 mg/dL — ABNORMAL HIGH (ref 0–99)
Total CHOL/HDL Ratio: 4
Triglycerides: 175 mg/dL — ABNORMAL HIGH (ref 0.0–149.0)

## 2012-12-25 LAB — COMPREHENSIVE METABOLIC PANEL
ALT: 10 U/L (ref 0–35)
AST: 18 U/L (ref 0–37)
CO2: 23 mEq/L (ref 19–32)
Calcium: 9 mg/dL (ref 8.4–10.5)
Chloride: 108 mEq/L (ref 96–112)
Creatinine, Ser: 2 mg/dL — ABNORMAL HIGH (ref 0.4–1.2)
GFR: 24.56 mL/min — ABNORMAL LOW (ref >60.00)
Sodium: 142 mEq/L (ref 135–145)
Total Protein: 7 g/dL (ref 6.0–8.3)

## 2012-12-25 MED ORDER — IBANDRONATE SODIUM 3 MG/3ML IV SOLN: 3.0000 mg | Freq: Once | INTRAVENOUS | Status: DC

## 2012-12-25 NOTE — Addendum Note (Signed)
Addended by: Consuello Masse on: 12/25/2012 09:21 AM   Modules accepted: Orders, Medications

## 2012-12-25 NOTE — Telephone Encounter (Signed)
In outbox

## 2012-12-25 NOTE — Telephone Encounter (Signed)
Message copied by Excell Seltzer on Thu Dec 25, 2012  8:25 AM ------      Message from: Alvina Chou      Created: Tue Dec 16, 2012  2:48 PM      Regarding: Lab orders for Thursday, 3.13.14       Patient is scheduled for CPX labs, please order future labs, Thanks , Terri       ------

## 2012-12-25 NOTE — Telephone Encounter (Signed)
Message copied by Excell Seltzer on Thu Dec 25, 2012  9:06 AM ------      Message from: Alvina Chou      Created: Thu Dec 25, 2012  8:53 AM      Regarding: added lab order       Patient wanted lipid checked, fasting today.It had been over a year. I added, let me know if you want me to cancel it. Thanks, terri ------

## 2013-01-02 ENCOUNTER — Encounter: Payer: Self-pay | Admitting: Family Medicine

## 2013-01-02 ENCOUNTER — Ambulatory Visit (INDEPENDENT_AMBULATORY_CARE_PROVIDER_SITE_OTHER): Payer: Medicare Other | Admitting: Family Medicine

## 2013-01-02 VITALS — BP 130/64 | HR 56 | Temp 98.5°F | Ht 61.0 in | Wt 135.0 lb

## 2013-01-02 DIAGNOSIS — J449 Chronic obstructive pulmonary disease, unspecified: Secondary | ICD-10-CM

## 2013-01-02 DIAGNOSIS — G4700 Insomnia, unspecified: Secondary | ICD-10-CM

## 2013-01-02 DIAGNOSIS — R9389 Abnormal findings on diagnostic imaging of other specified body structures: Secondary | ICD-10-CM

## 2013-01-02 DIAGNOSIS — I5032 Chronic diastolic (congestive) heart failure: Secondary | ICD-10-CM

## 2013-01-02 DIAGNOSIS — R300 Dysuria: Secondary | ICD-10-CM

## 2013-01-02 DIAGNOSIS — E7800 Pure hypercholesterolemia, unspecified: Secondary | ICD-10-CM

## 2013-01-02 DIAGNOSIS — Z0000 Encounter for general adult medical examination without abnormal findings: Secondary | ICD-10-CM

## 2013-01-02 DIAGNOSIS — I10 Essential (primary) hypertension: Secondary | ICD-10-CM

## 2013-01-02 DIAGNOSIS — Z23 Encounter for immunization: Secondary | ICD-10-CM

## 2013-01-02 DIAGNOSIS — N184 Chronic kidney disease, stage 4 (severe): Secondary | ICD-10-CM

## 2013-01-02 LAB — POCT URINALYSIS DIPSTICK
Glucose, UA: NEGATIVE
Ketones, UA: NEGATIVE
Protein, UA: NEGATIVE
Spec Grav, UA: 1.025
Urobilinogen, UA: NEGATIVE

## 2013-01-02 MED ORDER — GABAPENTIN 600 MG PO TABS: 600.0000 mg | ORAL_TABLET | Freq: Every day | ORAL | Status: DC

## 2013-01-02 MED ORDER — TIOTROPIUM BROMIDE MONOHYDRATE 18 MCG IN CAPS: 18.0000 ug | ORAL_CAPSULE | Freq: Every day | RESPIRATORY_TRACT | Status: DC

## 2013-01-02 MED ORDER — TRAZODONE HCL 50 MG PO TABS: 25.0000 mg | ORAL_TABLET | Freq: Every evening | ORAL | Status: DC | PRN

## 2013-01-02 MED ORDER — HYDRALAZINE HCL 50 MG PO TABS: 75.0000 mg | ORAL_TABLET | Freq: Three times a day (TID) | ORAL | Status: DC

## 2013-01-02 MED ORDER — OMEPRAZOLE 40 MG PO CPDR: 40.0000 mg | DELAYED_RELEASE_CAPSULE | Freq: Every day | ORAL | Status: DC

## 2013-01-02 NOTE — Assessment & Plan Note (Signed)
Followed by nephrology.  Stable.   

## 2013-01-02 NOTE — Assessment & Plan Note (Signed)
Stable on spiriva 

## 2013-01-02 NOTE — Assessment & Plan Note (Signed)
Occ high on current regimen, usually well controlled. We will tolerate higher numbers due to age.  Continue current regimen.

## 2013-01-02 NOTE — Patient Instructions (Addendum)
For slight fluid overload  Present now take furosemide 20 mg for the next 2 days then go back to as needed.  Follow weights at home if weights are creeping up, swelling in legs increasing and breathing becoming slightly more difficult... That is when to take the fluid pill.  Okay to take acetominophen  as long as you do not increase it further, max in 4000 mg daily.  Stop tyelnol PM.. Try trazodone for sleep instead.

## 2013-01-02 NOTE — Addendum Note (Signed)
Addended by: Consuello Masse on: 01/02/2013 11:26 AM   Modules accepted: Orders

## 2013-01-02 NOTE — Progress Notes (Signed)
HPI  I have personally reviewed the Medicare Annual Wellness questionnaire and have noted  1. The patient's medical and social history  2. Their use of alcohol, tobacco or illicit drugs  3. Their current medications and supplements  4. The patient's functional ability including ADL's, fall risks, home safety risks and hearing or visual  impairment.  5. Diet and physical activities  6. Evidence for depression or mood disorders   The patients weight, height, BMI and visual acuity have been recorded in the chart  I have made referrals, counseling and provided education to the patient based review of the above and I have provided the pt with a written personalized care plan for preventive services.   Hospitalized for Cdiff and PNA in last year   Hypertension: Well controlled on current medication  Using medication without problems or lightheadedness: None  Chest pain with exertion:None  Edema:Slight increase, but she is wearing compression hose. Short of breath:Slight increase in  Sob going up steps... Some gradually increase in weakness with walking. Average home BPs: rarely 170/53... 130/70s usually. Other issues:   Diastolic CHF, euvolemic.. Followed by cardiology, Dr. Mariah Milling.. Follow up in 09/2013.  Off fluid pill per Dr. Mariah Milling to use prn, bblocker, cachannel.   She is currently slightly fluid overloaded.  Elevated Cholesterol: Improved control on red yeast rice 2400 mg daily for past 2 month. LDL not at goal <70.  Lab Results  Component Value Date   CHOL 186 12/25/2012   HDL 42.60 12/25/2012   LDLCALC 108* 12/25/2012   LDLDIRECT 122.1 08/17/2010   TRIG 175.0* 12/25/2012   CHOLHDL 4 12/25/2012  Using medications without problems:None  Muscle aches: None  Diet compliance: Good  Exercise:None, limited Other complaints:   Insomnia, off and on: 1-2 times a month. Does not nap.  Trouble falling asleep.  Denies depression, anxiety.  Gets up once a night to urinate.  Using tylenol  PM one tablet a night... No SE.  This helps minimally.  COPD.Marland Kitchen Using spiriva but only every other day.   CKD, stable followed by Nephrology.    Using acetominophen  650 mg 2 tabs twice daily for hip pain.. But also taking tylenol PM  (500 mg tylenol) 3100 mg of actetominophen.  Review of Systems  Constitutional: Negative for fever, fatigue and unexpected weight change.  NO EASIR BLEEDING, no blood noted in stool, urine, vaginal etc. HENT: Negative for ear pain, congestion, sore throat, sneezing, trouble swallowing and sinus pressure.  Dry mouth  Eyes: Negative for pain and itching.  Respiratory: Negative for cough, shortness of breath and wheezing.  Cardiovascular: Negative for chest pain, palpitations and leg swelling.  Gastrointestinal: Negative for nausea, abdominal pain, diarrhea, constipation and blood in stool.  Genitourinary: Negative for dysuria, hematuria, vaginal discharge, difficulty urinating and menstrual problem.  Occ vaginal irritation, using estrogen cream. Concerned about uti. Some urinary frequency, and increase in odor of urine.  Skin: Negative for rash.  Neurological: Negative for syncope, weakness, light-headedness, numbness and headaches.  Psychiatric/Behavioral: Negative for confusion and dysphoric mood. The patient is not nervous/anxious.  Objective:   Physical Exam  Constitutional: Vital signs are normal. She appears well-developed and well-nourished. She is cooperative. Non-toxic appearance. She does not appear ill. No distress.  Elderly female in NAD  HENT:  Head: Normocephalic.  Right Ear: Hearing, tympanic membrane, external ear and ear canal normal. Tympanic membrane is not erythematous, not retracted and not bulging.  Left Ear: Hearing, tympanic membrane, external ear and ear canal normal.  Tympanic membrane is not erythematous, not retracted and not bulging.  Nose: No mucosal edema or rhinorrhea. Right sinus exhibits no maxillary sinus tenderness and no  frontal sinus tenderness. Left sinus exhibits no maxillary sinus tenderness and no frontal sinus tenderness.  Mouth/Throat: Uvula is midline, oropharynx is clear and moist and mucous membranes are normal.  Eyes: Conjunctivae, EOM and lids are normal. Pupils are equal, round, and reactive to light. No foreign bodies found.  Neck: Trachea normal and normal range of motion. Neck supple. Carotid bruit is not present. No mass and no thyromegaly present.  Cardiovascular: Normal rate, regular rhythm, S1 normal, S2 normal, normal heart sounds, intact distal pulses and normal pulses. Exam reveals no gallop and no friction rub.  No murmur heard.  Pulmonary/Chest: Effort normal. Not tachypneic. No respiratory distress. She has no decreased breath sounds. She has wheezes. She has no rhonchi. She has no rales. She exhibits no tenderness.  Diffuse intermittant wheeze  Abdominal: Soft. Normal appearance and bowel sounds are normal. There is no tenderness. There is no CVA tenderness.  Neurological: She is alert.  Skin: Skin is warm, dry and intact. No rash noted.  Psychiatric: Her speech is normal and behavior is normal. Judgment and thought content normal. Her mood appears not anxious. Cognition and memory are normal. She does not exhibit a depressed mood.  Assessment & Plan:   AMW: The patient's preventative maintenance and recommended screening tests for an annual wellness exam were reviewed in full today.  Brought up to date unless services declined.  Counselled on the importance of diet, exercise, and its role in overall health and mortality.  The patient's FH and SH was reviewed, including their home life, tobacco status, and drug and alcohol status.   Vaccines: Uptodate with flu, Td, zoster. Due for pneumonia repeat vaccine given lung issues. DXA: last in 2010 normal.  NO indication for mammo, breast exam, pelvic/pap, colonoscopy at this age.

## 2013-01-02 NOTE — Assessment & Plan Note (Signed)
Mild fluids overload.Marland Kitchen Restart lasix for 2 days then stop.

## 2013-01-02 NOTE — Assessment & Plan Note (Signed)
Well controlled for age on current regimen. Tolerate high goal given age. On red yeast rice, pt wishes to continue.

## 2013-01-02 NOTE — Assessment & Plan Note (Signed)
Eval UA today.

## 2013-01-02 NOTE — Assessment & Plan Note (Signed)
Trial of trazodone for sleep. Stop tylenol PM.

## 2013-01-16 ENCOUNTER — Telehealth: Payer: Self-pay | Admitting: Family Medicine

## 2013-01-16 NOTE — Telephone Encounter (Signed)
Caller: Elizabeth Silva/Patient; Phone: [phone]; Reason for Call: Pt requesting result of her urine culture.  PLEASE F/U WITH PT, THANK YOU, .

## 2013-01-19 NOTE — Telephone Encounter (Signed)
There does not appear to be a urine culture. I am going to let Dr. B handle this - I think it would be more harmful for me to call in this case.

## 2013-01-20 NOTE — Telephone Encounter (Signed)
Notify pt that we are sorry but the culture was not sent, I am not sure what the problem was. If she is NOW having symptoms (document what they are , I believe she was minimally symptomatic at last OV)...have her bring in urine sample.. No appt needed and we will redip and send for culture.  if she is having significant symptoms I will start empiric antibiotics while waiting for culture to return.

## 2013-01-20 NOTE — Telephone Encounter (Signed)
Agreed. At last OV... Appeared likely to be contamination of urine sample.

## 2013-01-20 NOTE — Telephone Encounter (Signed)
Spoke with patient and she says she seems to be getting along pretty good but if she gets to feeling bad she will bring a sample

## 2013-03-24 ENCOUNTER — Ambulatory Visit (INDEPENDENT_AMBULATORY_CARE_PROVIDER_SITE_OTHER): Payer: Medicare Other | Admitting: Cardiovascular Disease

## 2013-03-24 ENCOUNTER — Ambulatory Visit: Payer: Medicare Other | Admitting: Cardiovascular Disease

## 2013-03-24 ENCOUNTER — Encounter: Payer: Self-pay | Admitting: Cardiovascular Disease

## 2013-03-24 VITALS — BP 158/58 | HR 60 | Ht 61.0 in | Wt 137.2 lb

## 2013-03-24 DIAGNOSIS — R011 Cardiac murmur, unspecified: Secondary | ICD-10-CM

## 2013-03-24 DIAGNOSIS — E7800 Pure hypercholesterolemia, unspecified: Secondary | ICD-10-CM

## 2013-03-24 DIAGNOSIS — R0789 Other chest pain: Secondary | ICD-10-CM

## 2013-03-24 DIAGNOSIS — I10 Essential (primary) hypertension: Secondary | ICD-10-CM

## 2013-03-24 MED ORDER — NEBIVOLOL HCL 10 MG PO TABS: 10.0000 mg | ORAL_TABLET | Freq: Every day | ORAL | Status: DC

## 2013-03-24 MED ORDER — HYDRALAZINE HCL 100 MG PO TABS: 100.0000 mg | ORAL_TABLET | Freq: Three times a day (TID) | ORAL | Status: DC

## 2013-03-24 MED ORDER — AMLODIPINE BESYLATE 5 MG PO TABS: 5.0000 mg | ORAL_TABLET | Freq: Two times a day (BID) | ORAL | Status: DC

## 2013-03-24 NOTE — Assessment & Plan Note (Signed)
Blood pressure continues to be mildly elevated. She continues to have pulsating in buzzing in her ear. We will increase her hydralazine to 100 mg 3 times a day. We have suggested she take extra one half pill, 50 mg, as needed for severe hypertension episodes

## 2013-03-24 NOTE — Progress Notes (Signed)
Patient ID: Elizabeth Silva, female    DOB: 06-20-1918, 79 y.o.   MRN: [id number]  HPI Comments: 79 yo with history of difficult to control HTN, COPD, hyperlipidemia, left bundle-branch block, moderate tricuspid valve regurgitation since 2011 with normal ejection fraction in 2012, presents today for routine followup.     Previous hospital admission for sepsis, recurrent pneumonia, diagnosed with C. Difficile.  treated with Flagyl and diarrhea improved. Notes from the hospital indicate Klebsiella urinary tract infection treated with antibiotics, C. difficile improving on Flagyl, bradycardia, recurrent pneumonia that was improving on antibiotics, sepsis that was treated and improved, chronic kidney disease.   She reports that overall she is doing well. Her gait is somewhat unsteady. She does not want to use a cane. No significant lower extremity edema. She's not taking Lasix. Last time she use Lasix was one month ago. Blood pressure continues to run high with systolic pressure typically in the 140-150 range. Sometimes she has a hard beat in her chest, frequent buzzing noise in her right ear, occasional headaches. Occasionally has some chest heaviness. Otherwise is active with no complaints.    Previous  Echo was suggestive of diastolic dysfunction with normal EF (60-65%). Moderate TR      EKG shows normal sinus rhythm with left bundle branch block, rate 60 beats per minute      Outpatient Encounter Prescriptions as of 03/24/2013  Medication Sig Dispense Refill  . acetaminophen (TYLENOL) 500 MG tablet Take 500 mg by mouth every 6 (six) hours as needed.        Marland Kitchen albuterol (PROVENTIL HFA;VENTOLIN HFA) 108 (90 BASE) MCG/ACT inhaler Inhale 2 puffs into the lungs every 6 (six) hours as needed for wheezing.  1 Inhaler  3  . amLODipine (NORVASC) 5 MG tablet Take 1 tablet (5 mg total) by mouth 2 (two) times daily.  180 tablet  3  . aspirin 81 MG EC tablet Take 81 mg by mouth daily.        . fesoterodine  (TOVIAZ) 8 MG TB24 Take 1 tablet (8 mg total) by mouth daily.  90 tablet  3  . fluticasone (FLONASE) 50 MCG/ACT nasal spray Place 1 spray into the nose daily.       . furosemide (LASIX) 20 MG tablet Take 20 mg by mouth daily as needed.      . gabapentin (NEURONTIN) 600 MG tablet Take 1 tablet (600 mg total) by mouth at bedtime.  90 tablet  3  . hydrALAZINE (APRESOLINE) 50 MG tablet Take 1.5 tablets (75 mg total) by mouth 3 (three) times daily.  405 tablet  3  . nebivolol (BYSTOLIC) 10 MG tablet Take 1 tablet (10 mg total) by mouth daily.  90 tablet  3  . omeprazole (PRILOSEC) 40 MG capsule Take 1 capsule (40 mg total) by mouth daily.  90 capsule  3  . Red Yeast Rice 600 MG CAPS Take 1 capsule by mouth daily.        Marland Kitchen tiotropium (SPIRIVA) 18 MCG inhalation capsule Place 1 capsule (18 mcg total) into inhaler and inhale daily.  90 capsule  3  . vitamin E 1000 UNIT capsule Take 1,000 Units by mouth daily.          Review of Systems  Constitutional: Negative.   HENT: Negative.   Eyes: Negative.   Respiratory: Negative.   Cardiovascular: Negative.   Gastrointestinal: Negative.   Musculoskeletal: Positive for gait problem.  Skin: Negative.   Neurological: Negative.   Psychiatric/Behavioral: Negative.  All other systems reviewed and are negative.   BP 158/58  Pulse 60  Ht 5\' 1"  (1.549 m)  Wt 137 lb 4 oz (62.256 kg)  BMI 25.95 kg/m2  Physical Exam  Nursing note and vitals reviewed. Constitutional: She is oriented to person, place, and time. She appears well-developed and well-nourished.  HENT:  Head: Normocephalic.  Nose: Nose normal.  Mouth/Throat: Oropharynx is clear and moist.  Eyes: Conjunctivae are normal. Pupils are equal, round, and reactive to light.  Neck: Normal range of motion. Neck supple. No JVD present. Carotid bruit is present.  Cardiovascular: Normal rate, regular rhythm, S1 normal, S2 normal and intact distal pulses.  Exam reveals no gallop and no friction rub.    Murmur heard.  Crescendo systolic murmur is present with a grade of 2/6  Pulmonary/Chest: Effort normal and breath sounds normal. No respiratory distress. She has no wheezes. She has no rales. She exhibits no tenderness.  Abdominal: Soft. Bowel sounds are normal. She exhibits no distension. There is no tenderness.  Musculoskeletal: Normal range of motion. She exhibits no edema and no tenderness.  Lymphadenopathy:    She has no cervical adenopathy.  Neurological: She is alert and oriented to person, place, and time. Coordination normal.  Skin: Skin is warm and dry. No rash noted. No erythema.  Psychiatric: She has a normal mood and affect. Her behavior is normal. Judgment and thought content normal.    Assessment and Plan

## 2013-03-24 NOTE — Assessment & Plan Note (Signed)
Moderate tricuspid regurg by echocardiogram several years ago. No significant change in intensity or clinical findings. Not requiring high dose Lasix, in fact is hardly taking Lasix at all and has been stable.

## 2013-03-24 NOTE — Patient Instructions (Addendum)
You are doing well. Please increase the hydralazine to 100 mg three times a day If the blood pressure is running high, in an emergency, it would be ok to take an additional 1/2 hydralazine pill  Please call us if you have new issues that need to be addressed before your next appt.  Your physician wants you to follow-up in: 6 months.  You will receive a reminder letter in the mail two months in advance. If you don't receive a letter, please call our office to schedule the follow-up appointment.

## 2013-03-24 NOTE — Assessment & Plan Note (Signed)
Currently taking red yeast rice 

## 2013-03-31 ENCOUNTER — Ambulatory Visit: Payer: Medicare Other | Admitting: Cardiovascular Disease

## 2013-05-28 ENCOUNTER — Ambulatory Visit (INDEPENDENT_AMBULATORY_CARE_PROVIDER_SITE_OTHER): Payer: Medicare Other | Admitting: Family Medicine

## 2013-05-28 ENCOUNTER — Encounter: Payer: Self-pay | Admitting: Family Medicine

## 2013-05-28 VITALS — BP 180/60 | HR 76 | Temp 97.9°F | Ht 61.0 in | Wt 137.0 lb

## 2013-05-28 DIAGNOSIS — I10 Essential (primary) hypertension: Secondary | ICD-10-CM

## 2013-05-28 DIAGNOSIS — R9389 Abnormal findings on diagnostic imaging of other specified body structures: Secondary | ICD-10-CM

## 2013-05-28 DIAGNOSIS — R0789 Other chest pain: Secondary | ICD-10-CM | POA: Insufficient documentation

## 2013-05-28 DIAGNOSIS — R0602 Shortness of breath: Secondary | ICD-10-CM

## 2013-05-28 NOTE — Patient Instructions (Addendum)
We will call with lab results.  Go to ER if severe shortness of breath, call if SOB worsening.  Continue prednisone 20 mg daily tomorrow.

## 2013-05-28 NOTE — Progress Notes (Signed)
  Subjective:    Patient ID: Elizabeth Silva, female    DOB: 03-09-1918, 79 y.o.   MRN: [id number]  HPI  79 year old female with history of  COPD, HTN, diastolic heart failure, CRF presents with shortness of breath off and on, worse when she stands up or with exertion. Pressure in upper abdomen/Lower chest off and on, occ at same time.   No change in cough, has productive cough, in afternoon for a while ( past year) feels like she has had frog in throat that she has to clear. No real change in this. No orthopnea.  No fever.  Decreased appetite.  Occ feeling agitated and nervous. Insomnia off and on. Stable swelling.  She took extra furosemide yesterday and today ... Maybe helped a little.  Last saw Dr. Mariah Milling 2 months...blood pressure was elevated and hydralaxzine was increased prn. Reviewed note in detail.  Review of Systems  Constitutional: Negative for fever and fatigue.  HENT: Negative for ear pain.   Eyes: Negative for pain.  Respiratory: Positive for shortness of breath. Negative for cough, chest tightness and wheezing.   Cardiovascular: Negative for chest pain, palpitations and leg swelling.  Gastrointestinal: Negative for nausea, abdominal pain and diarrhea.  Genitourinary: Negative for dysuria.       Objective:   Physical Exam  Constitutional: Vital signs are normal. She appears well-developed and well-nourished. She is cooperative.  Non-toxic appearance. She does not appear ill. No distress.  Elderly female appears younger than stated age  HENT:  Head: Normocephalic.  Right Ear: Hearing, tympanic membrane, external ear and ear canal normal. Tympanic membrane is not erythematous, not retracted and not bulging.  Left Ear: Hearing, tympanic membrane, external ear and ear canal normal. Tympanic membrane is not erythematous, not retracted and not bulging.  Nose: No mucosal edema or rhinorrhea. Right sinus exhibits no maxillary sinus tenderness and no frontal sinus tenderness. Left  sinus exhibits no maxillary sinus tenderness and no frontal sinus tenderness.  Mouth/Throat: Uvula is midline, oropharynx is clear and moist and mucous membranes are normal.  Eyes: Conjunctivae, EOM and lids are normal. Pupils are equal, round, and reactive to light. Lids are everted and swept, no foreign bodies found.  Neck: Trachea normal and normal range of motion. Neck supple. Carotid bruit is not present. No mass and no thyromegaly present.  Cardiovascular: Normal rate, regular rhythm, S1 normal, S2 normal, normal heart sounds, intact distal pulses and normal pulses.  Exam reveals no gallop and no friction rub.   No murmur heard. Pulmonary/Chest: Effort normal and breath sounds normal. Not tachypneic. No respiratory distress. She has no decreased breath sounds. She has no wheezes. She has no rhonchi. She has no rales.  Abdominal: Soft. Normal appearance and bowel sounds are normal. There is no tenderness.  Neurological: She is alert.  Skin: Skin is warm, dry and intact. No rash noted.  Psychiatric: Her speech is normal and behavior is normal. Judgment and thought content normal. Her mood appears not anxious. Cognition and memory are normal. She does not exhibit a depressed mood.          Assessment & Plan:

## 2013-05-29 ENCOUNTER — Telehealth: Payer: Self-pay

## 2013-05-29 LAB — CBC WITH DIFFERENTIAL/PLATELET
Basophils Absolute: 0.1 10*3/uL (ref 0.0–0.1)
Eosinophils Absolute: 0.2 10*3/uL (ref 0.0–0.7)
HCT: 36.9 % (ref 36.0–46.0)
Lymphocytes Relative: 23.3 % (ref 12.0–46.0)
Lymphs Abs: 2.4 10*3/uL (ref 0.7–4.0)
Monocytes Absolute: 0.6 10*3/uL (ref 0.1–1.0)
Neutro Abs: 7 10*3/uL (ref 1.4–7.7)
RBC: 3.85 Mil/uL — ABNORMAL LOW (ref 3.87–5.11)
WBC: 10.1 10*3/uL (ref 4.5–10.5)

## 2013-05-29 NOTE — Telephone Encounter (Signed)
Pt was seen 05/28/13 and pt said someone was to call pt with EKG report. Pt said today the SOB is the same as when seen on 05/28/13; SOB worse upon exertion. Pt said no CP, H/A, or dizziness. Pt was to take Furosemide 20 mg daily for 3 days (today is the third day) and then pt will take Furosemide as needed. Pt request cb with EKG report.

## 2013-05-29 NOTE — Telephone Encounter (Signed)
I talked with Dr. Ermalene Searing.  Please call pt.  Per Dr. Leonard Schwartz- labs are unremarkable and EKG was w/o change.  Have her continue the lasix as needed and update Dr. Leonard Schwartz on Monday.  Thanks.

## 2013-05-29 NOTE — Telephone Encounter (Signed)
Patient advised.

## 2013-06-01 ENCOUNTER — Telehealth: Payer: Self-pay | Admitting: *Deleted

## 2013-06-01 NOTE — Telephone Encounter (Signed)
Spoke w/ pt.  Per Dr. Windell Hummingbird last office note, pt instructed to increase hydralazine by 1/2 pill when having symptoms.  Pt understands & will call if symptoms continue.

## 2013-06-01 NOTE — Telephone Encounter (Signed)
Patient is having sob and is nervous please advise

## 2013-06-04 NOTE — Assessment & Plan Note (Signed)
No clear infection (no clear sign of COPD exacerbation, cough stable)... No physical sign other that increase BP of fluid overload. Felt better though with 2 days of increase lasix. Take one more day then stop. Will check BNP as well as for other causes of SOB.  EKG stable from June check. ? If due to anxiety. If not improving consider repeat CXR.

## 2013-06-04 NOTE — Assessment & Plan Note (Signed)
Use extra hydralazine as recommended at last cardiology OV.

## 2013-06-24 ENCOUNTER — Telehealth: Payer: Self-pay | Admitting: Family Medicine

## 2013-06-24 NOTE — Telephone Encounter (Signed)
Patient has a 6 month follow up appointment scheduled with you on 07/09/13.  Patient wants to know if you want to check her kidneys before her appointment.  Patient said she had lab work done on 05/28/13.

## 2013-06-25 NOTE — Telephone Encounter (Signed)
Patient notified to make lab appointment prior to office visit on 07/09/2013.

## 2013-06-25 NOTE — Telephone Encounter (Signed)
Yes please have pt make a lab appt [prior to OV.

## 2013-07-01 ENCOUNTER — Other Ambulatory Visit (INDEPENDENT_AMBULATORY_CARE_PROVIDER_SITE_OTHER): Payer: Medicare Other

## 2013-07-01 ENCOUNTER — Telehealth: Payer: Self-pay | Admitting: Family Medicine

## 2013-07-01 DIAGNOSIS — I10 Essential (primary) hypertension: Secondary | ICD-10-CM

## 2013-07-01 LAB — COMPREHENSIVE METABOLIC PANEL
BUN: 55 mg/dL — ABNORMAL HIGH (ref 6–23)
CO2: 25 mEq/L (ref 19–32)
Creatinine, Ser: 1.8 mg/dL — ABNORMAL HIGH (ref 0.4–1.2)
GFR: 27.69 mL/min — ABNORMAL LOW (ref >60.00)
Glucose, Bld: 91 mg/dL (ref 70–99)
Total Bilirubin: 0.5 mg/dL (ref 0.3–1.2)

## 2013-07-01 LAB — CBC WITH DIFFERENTIAL/PLATELET
Eosinophils Relative: 2.6 % (ref 0.0–5.0)
HCT: 35.2 % — ABNORMAL LOW (ref 36.0–46.0)
Hemoglobin: 11.7 g/dL — ABNORMAL LOW (ref 12.0–15.0)
Lymphs Abs: 1.9 10*3/uL (ref 0.7–4.0)
Monocytes Relative: 8.7 % (ref 3.0–12.0)
Neutro Abs: 4.3 10*3/uL (ref 1.4–7.7)
Platelets: 230 10*3/uL (ref 150.0–400.0)
WBC: 7 10*3/uL (ref 4.5–10.5)

## 2013-07-01 NOTE — Telephone Encounter (Signed)
Notify pt.. She just had labs in August and has labs at kidney MD[facility] .Marland Kitchen She does not need lab visit today.

## 2013-07-01 NOTE — Telephone Encounter (Signed)
Message copied by Kerby Nora E on Wed Jul 01, 2013  8:21 AM ------      Message from: [place], [place] J      Created: Mon Jun 29, 2013  3:44 PM      Regarding: Lab orders for Wednesday, 9.17.14       Lab orders for a 6 month f/u ------

## 2013-07-01 NOTE — Telephone Encounter (Signed)
Spoke with Elizabeth Silva.  She was already here at office and had blood drawn.  She states she does not see a kidney doctor anymore, that Dr. Ermalene Searing does all her labs.  States she came in today to have her kidney labs done.  Will forward to Dr. Ermalene Searing to order labs.

## 2013-07-01 NOTE — Telephone Encounter (Signed)
Per Dr Patsy Lager, CBC, CMP added.

## 2013-07-09 ENCOUNTER — Ambulatory Visit (INDEPENDENT_AMBULATORY_CARE_PROVIDER_SITE_OTHER)
Admission: RE | Admit: 2013-07-09 | Discharge: 2013-07-09 | Disposition: A | Discharge status: Home / Self Care | Payer: Medicare Other | Source: Ambulatory Visit | Attending: Family Medicine | Admitting: Family Medicine

## 2013-07-09 ENCOUNTER — Encounter: Payer: Self-pay | Admitting: Family Medicine

## 2013-07-09 ENCOUNTER — Ambulatory Visit (INDEPENDENT_AMBULATORY_CARE_PROVIDER_SITE_OTHER): Payer: Medicare Other | Admitting: Family Medicine

## 2013-07-09 VITALS — BP 140/56 | HR 72 | Temp 98.5°F | Wt 139.0 lb

## 2013-07-09 DIAGNOSIS — Z23 Encounter for immunization: Secondary | ICD-10-CM

## 2013-07-09 DIAGNOSIS — R0602 Shortness of breath: Secondary | ICD-10-CM

## 2013-07-09 DIAGNOSIS — I10 Essential (primary) hypertension: Secondary | ICD-10-CM

## 2013-07-09 DIAGNOSIS — R9389 Abnormal findings on diagnostic imaging of other specified body structures: Secondary | ICD-10-CM

## 2013-07-09 NOTE — Addendum Note (Signed)
Addended by: Annamarie Major on: 07/09/2013 10:48 AM   Modules accepted: Orders

## 2013-07-09 NOTE — Progress Notes (Signed)
Subjective:    Patient ID: Elizabeth Silva, female    DOB: 03/31/1918, 79 y.o.   MRN: [id number]  HPI  78 year old female with history of COPD, HTN, diastolic heart failure, CRF here for 6 month follow up.  She was seen by myself 1 month ago for DOE: OV NOTE: ESSENTIAL HYPERTENSION -       Use extra hydralazine as recommended at last cardiology OV.       SOB (shortness of breath) -   No clear infection (no clear sign of COPD exacerbation, cough stable)... No physical sign other that increase BP of fluid overload. Felt better though with 2 days of increase lasix. Take one more day then stop. Will check BNP as well as for other causes of SOB.  EKG stable from June check. ? If due to anxiety. If not improving consider repeat CXR.   Today she reports  She is still having shortness of breath all the time, but stable and back at baseline. No cough, does have daily mucus production in AM, does fine after that.  She has good days and bad days overall. BP at home 125-150 systolic, occ using hydralazine extra as needed. No fluid overload.  ( She dis have change on CT thought to be from pneumonia, evaluated by repeat CT with Dr. Lavell Islam)  She does have some episodes of nervousness, felt trembly. Prior to a church meeting. Occuring only about 2 times  A month. She denies depression.  CRF: stable Cr 1.8-2.0          Review of Systems  Constitutional: Negative for fever and fatigue.  HENT: Negative for ear pain.   Eyes: Negative for pain.  Respiratory: Negative for chest tightness and shortness of breath.   Cardiovascular: Negative for chest pain, palpitations and leg swelling.  Gastrointestinal: Negative for nausea, abdominal pain, diarrhea and constipation.  Genitourinary: Negative for dysuria.  Psychiatric/Behavioral: Negative for dysphoric mood.       Objective:   Physical Exam  Constitutional: Vital signs are normal. She appears well-developed and well-nourished. She is  cooperative.  Non-toxic appearance. She does not appear ill. No distress.  Elderly female appears younger than stated age  HENT:  Head: Normocephalic.  Right Ear: Hearing, tympanic membrane, external ear and ear canal normal. Tympanic membrane is not erythematous, not retracted and not bulging.  Left Ear: Hearing, tympanic membrane, external ear and ear canal normal. Tympanic membrane is not erythematous, not retracted and not bulging.  Nose: No mucosal edema or rhinorrhea. Right sinus exhibits no maxillary sinus tenderness and no frontal sinus tenderness. Left sinus exhibits no maxillary sinus tenderness and no frontal sinus tenderness.  Mouth/Throat: Uvula is midline, oropharynx is clear and moist and mucous membranes are normal.  Eyes: Conjunctivae, EOM and lids are normal. Pupils are equal, round, and reactive to light. Lids are everted and swept, no foreign bodies found.  Neck: Trachea normal and normal range of motion. Neck supple. Carotid bruit is not present. No mass and no thyromegaly present.  Cardiovascular: Normal rate, regular rhythm, S1 normal, S2 normal, normal heart sounds, intact distal pulses and normal pulses.  Exam reveals no gallop and no friction rub.   No murmur heard. Pulmonary/Chest: Effort normal and breath sounds normal. Not tachypneic. No respiratory distress. She has no decreased breath sounds. She has no wheezes. She has no rhonchi. She has no rales.  Abdominal: Soft. Normal appearance and bowel sounds are normal. There is no tenderness.  Neurological: She is alert.  Skin: Skin is warm, dry and intact. No rash noted.  Psychiatric: Her speech is normal and behavior is normal. Judgment and thought content normal. Her mood appears not anxious. Cognition and memory are normal. She does not exhibit a depressed mood.          Assessment & Plan:

## 2013-07-09 NOTE — Patient Instructions (Addendum)
We will call with X-ray results. Schedule medicare wellness in 6 months with labs prior.

## 2013-07-09 NOTE — Assessment & Plan Note (Signed)
Will eval with CXR. She did have changes last year felt due to PNA scarring on Chest CT. May need CT follow up as well.  pt denies anxiety is a component

## 2013-07-09 NOTE — Assessment & Plan Note (Signed)
Well controlled. Continue current medication.  

## 2013-07-10 ENCOUNTER — Telehealth: Payer: Self-pay | Admitting: Family Medicine

## 2013-07-10 NOTE — Telephone Encounter (Signed)
Message copied by Excell Seltzer on Fri Jul 10, 2013  2:15 PM ------      Message from: Damita Lack      Created: Fri Jul 10, 2013  9:30 AM       Records received and placed in Dr. Daphine Deutscher in box ------

## 2013-07-10 NOTE — Telephone Encounter (Signed)
Reviewed Dr. Alma Downs records and past CT. No clear changes on this CXR. No new infiltrate Given age and previous stability.. We will not eval further with CT chest unless pts symptoms change further.  pt in agreement.

## 2013-07-17 ENCOUNTER — Encounter: Payer: Self-pay | Admitting: Family Medicine

## 2013-07-17 ENCOUNTER — Ambulatory Visit (INDEPENDENT_AMBULATORY_CARE_PROVIDER_SITE_OTHER): Payer: Medicare Other | Admitting: Family Medicine

## 2013-07-17 VITALS — BP 160/58 | HR 65 | Temp 98.2°F | Ht 61.0 in | Wt 139.0 lb

## 2013-07-17 DIAGNOSIS — R350 Frequency of micturition: Secondary | ICD-10-CM

## 2013-07-17 DIAGNOSIS — N390 Urinary tract infection, site not specified: Secondary | ICD-10-CM

## 2013-07-17 DIAGNOSIS — K219 Gastro-esophageal reflux disease without esophagitis: Secondary | ICD-10-CM

## 2013-07-17 LAB — POCT URINALYSIS DIPSTICK
Bilirubin, UA: NEGATIVE
Ketones, UA: NEGATIVE
pH, UA: 6

## 2013-07-17 LAB — POCT UA - MICROSCOPIC ONLY

## 2013-07-17 MED ORDER — CIPROFLOXACIN HCL 250 MG PO TABS: 250.0000 mg | ORAL_TABLET | Freq: Two times a day (BID) | ORAL | Status: DC

## 2013-07-17 NOTE — Patient Instructions (Addendum)
Decrease acid in stomach by decreasing triggers such as caffeine, alcohol, chocolate, citris, peppermint, spicy, tomatos.  Start prilosec daily for stomach irritation for next few weeks.  We will call with urine culture results.  Start cipro for urinary infection, call if not improving by end of course.  Increase water.

## 2013-07-17 NOTE — Progress Notes (Signed)
  Subjective:    Patient ID: Elizabeth Silva, female    DOB: [birth year], 79 y.o.   MRN: [id number]  HPI 79 year old female with  CK, urge incontinence and chronic dysuria presents with  abdominla burning and urinary frequency.  She reports  She has been having worsening symptoms in last week. She has burning in low abdomen when she is urinating. No abdominal pain otherwise.  Increase urinary frequency.  She had sudden onset of burning pain in rectum and vaginal... Thought from hemmorhoids. Started Prep H and stool softners... This helped it resolve. Still harder pellet stools, but had stooped this med. Not drinking enough water.   She has had a few days of knawing in stomach like she is hungry.    Review of Systems  Constitutional: Negative for fever and fatigue.  HENT: Negative for ear pain.   Eyes: Negative for pain.  Respiratory: Negative for chest tightness and shortness of breath.   Cardiovascular: Negative for chest pain, palpitations and leg swelling.  Gastrointestinal: Positive for abdominal pain.  Genitourinary: Negative for dysuria.       Objective:   Physical Exam  Constitutional: Vital signs are normal. She appears well-developed and well-nourished. She is cooperative.  Non-toxic appearance. She does not appear ill. No distress.  Elderly female appears younger than stated age  HENT:  Head: Normocephalic.  Right Ear: Hearing, tympanic membrane, external ear and ear canal normal. Tympanic membrane is not erythematous, not retracted and not bulging.  Left Ear: Hearing, tympanic membrane, external ear and ear canal normal. Tympanic membrane is not erythematous, not retracted and not bulging.  Nose: No mucosal edema or rhinorrhea. Right sinus exhibits no maxillary sinus tenderness and no frontal sinus tenderness. Left sinus exhibits no maxillary sinus tenderness and no frontal sinus tenderness.  Mouth/Throat: Uvula is midline, oropharynx is clear and moist and mucous membranes  are normal.  Eyes: Conjunctivae, EOM and lids are normal. Pupils are equal, round, and reactive to light. Lids are everted and swept, no foreign bodies found.  Neck: Trachea normal and normal range of motion. Neck supple. Carotid bruit is not present. No mass and no thyromegaly present.  Cardiovascular: Normal rate, regular rhythm, S1 normal, S2 normal, normal heart sounds, intact distal pulses and normal pulses.  Exam reveals no gallop and no friction rub.   No murmur heard. Pulmonary/Chest: Effort normal and breath sounds normal. Not tachypneic. No respiratory distress. She has no decreased breath sounds. She has no wheezes. She has no rhonchi. She has no rales.  Abdominal: Soft. Normal appearance and bowel sounds are normal. There is tenderness in the suprapubic area. There is no rigidity, no guarding and no CVA tenderness.  Neurological: She is alert.  Skin: Skin is warm, dry and intact. No rash noted.  Psychiatric: Her speech is normal and behavior is normal. Judgment and thought content normal. Her mood appears not anxious. Cognition and memory are normal. She does not exhibit a depressed mood.          Assessment & Plan:

## 2013-07-17 NOTE — Assessment & Plan Note (Signed)
Restart prilosec. Avoid acid triggers.

## 2013-07-17 NOTE — Assessment & Plan Note (Signed)
Treat with antibiotics for uncomplicated UTI in elderly lady.. 7 day course. Increase water.

## 2013-07-19 LAB — URINE CULTURE

## 2013-07-31 ENCOUNTER — Telehealth: Payer: Self-pay | Admitting: Family Medicine

## 2013-07-31 MED ORDER — CIPROFLOXACIN HCL 250 MG PO TABS: 250.0000 mg | ORAL_TABLET | Freq: Two times a day (BID) | ORAL | Status: DC

## 2013-07-31 NOTE — Telephone Encounter (Signed)
Dr. Ermalene Searing gave pt med last week for a UTI.  Pt states she needs a refill on her medication because she thought she was getting better but she is not better now.  Please call pt [phone]

## 2013-07-31 NOTE — Telephone Encounter (Signed)
Refill sent in. Please notify pt.

## 2013-07-31 NOTE — Telephone Encounter (Signed)
Patient advised.

## 2013-08-28 ENCOUNTER — Ambulatory Visit (INDEPENDENT_AMBULATORY_CARE_PROVIDER_SITE_OTHER): Payer: Medicare Other | Admitting: Family Medicine

## 2013-08-28 ENCOUNTER — Encounter: Payer: Self-pay | Admitting: Family Medicine

## 2013-08-28 VITALS — BP 140/52 | HR 65 | Temp 98.2°F | Ht 61.0 in | Wt 138.8 lb

## 2013-08-28 DIAGNOSIS — R5383 Other fatigue: Secondary | ICD-10-CM

## 2013-08-28 DIAGNOSIS — R5381 Other malaise: Secondary | ICD-10-CM

## 2013-08-28 DIAGNOSIS — R300 Dysuria: Secondary | ICD-10-CM

## 2013-08-28 LAB — POCT URINALYSIS DIPSTICK
Blood, UA: NEGATIVE
Glucose, UA: NEGATIVE
Nitrite, UA: NEGATIVE
Spec Grav, UA: 1.01
Urobilinogen, UA: 0.2

## 2013-08-28 LAB — CBC WITH DIFFERENTIAL/PLATELET
Basophils Absolute: 0.1 10*3/uL (ref 0.0–0.1)
Basophils Relative: 0.9 % (ref 0.0–3.0)
Eosinophils Relative: 1.9 % (ref 0.0–5.0)
HCT: 35.8 % — ABNORMAL LOW (ref 36.0–46.0)
Hemoglobin: 11.8 g/dL — ABNORMAL LOW (ref 12.0–15.0)
Lymphs Abs: 1.8 10*3/uL (ref 0.7–4.0)
MCV: 94.2 fl (ref 78.0–100.0)
Monocytes Absolute: 0.9 10*3/uL (ref 0.1–1.0)
Monocytes Relative: 10.5 % (ref 3.0–12.0)
Neutro Abs: 5.7 10*3/uL (ref 1.4–7.7)
Platelets: 220 10*3/uL (ref 150.0–400.0)
RBC: 3.8 Mil/uL — ABNORMAL LOW (ref 3.87–5.11)
WBC: 8.7 10*3/uL (ref 4.5–10.5)

## 2013-08-28 LAB — COMPREHENSIVE METABOLIC PANEL
Alkaline Phosphatase: 45 U/L (ref 39–117)
BUN: 50 mg/dL — ABNORMAL HIGH (ref 6–23)
CO2: 26 mEq/L (ref 19–32)
Creatinine, Ser: 1.8 mg/dL — ABNORMAL HIGH (ref 0.4–1.2)
GFR: 28.04 mL/min — ABNORMAL LOW (ref >60.00)
Glucose, Bld: 102 mg/dL — ABNORMAL HIGH (ref 70–99)
Sodium: 138 mEq/L (ref 135–145)
Total Bilirubin: 0.4 mg/dL (ref 0.3–1.2)
Total Protein: 7.6 g/dL (ref 6.0–8.3)

## 2013-08-28 LAB — VITAMIN B12: Vitamin B-12: 874 pg/mL (ref 211–911)

## 2013-08-28 NOTE — Patient Instructions (Signed)
Stop at lab on way out.  We will call with reuslts of labs and culture next week.  If severe symptoms, go to ER.

## 2013-08-28 NOTE — Progress Notes (Signed)
Pre-visit discussion using our clinic review tool. No additional management support is needed unless otherwise documented below in the visit note.  

## 2013-08-28 NOTE — Progress Notes (Signed)
  Subjective:    Patient ID: Elizabeth Silva, female    DOB: Feb 15, 1918, 79 y.o.   MRN: [id number]  HPI   79 year old female with CKD, chronic dysuria and urinary incontinence presents for  Possible new UTI. She was treated for a pan-sensitive enterobacter UTI  On 10/3 with cipro 250mg  BID x 7 days.  Most recent UTI prior was enterobacter in 07/2012.  She reports she has not had only mild improvement incontinence.  No burning when urine comes out.  No urinary frequency and urgency really... She just loses control of urine all the time.  Gala Murdoch does not seem to help with incontinence.   Has some skin burning where urine hits. She reports that she has been weak lately, no energy x 1 month.. This is what bothers her the most.  Occ shaky and nervous feeling x 3 weeks.  No fever.  Review of Systems  Constitutional: Positive for fatigue. Negative for fever.  HENT: Negative for ear pain.   Eyes: Negative for pain.  Respiratory: Negative for cough.        Stable SOB  Cardiovascular: Positive for leg swelling. Negative for chest pain.       Stable  Gastrointestinal: Positive for nausea. Negative for vomiting, abdominal pain, diarrhea, constipation, blood in stool and abdominal distention.       Intermittant nausea after activity.       Objective:   Physical Exam  Constitutional: Vital signs are normal. She appears well-developed and well-nourished. She is cooperative.  Non-toxic appearance. She does not appear ill. No distress.  Elderly female in NAD  HENT:  Head: Normocephalic.  Right Ear: Hearing, tympanic membrane, external ear and ear canal normal. Tympanic membrane is not erythematous, not retracted and not bulging.  Left Ear: Hearing, tympanic membrane, external ear and ear canal normal. Tympanic membrane is not erythematous, not retracted and not bulging.  Nose: No mucosal edema or rhinorrhea. Right sinus exhibits no maxillary sinus tenderness and no frontal sinus tenderness. Left  sinus exhibits no maxillary sinus tenderness and no frontal sinus tenderness.  Mouth/Throat: Uvula is midline, oropharynx is clear and moist and mucous membranes are normal.  Eyes: Conjunctivae, EOM and lids are normal. Pupils are equal, round, and reactive to light. Lids are everted and swept, no foreign bodies found.  Neck: Trachea normal and normal range of motion. Neck supple. Carotid bruit is not present. No mass and no thyromegaly present.  Cardiovascular: Normal rate, regular rhythm, S1 normal, S2 normal, normal heart sounds, intact distal pulses and normal pulses.  Exam reveals no gallop and no friction rub.   No murmur heard. Mild B edema  Pulmonary/Chest: Effort normal and breath sounds normal. Not tachypneic. No respiratory distress. She has no decreased breath sounds. She has no wheezes. She has no rhonchi. She has no rales.  Abdominal: Soft. Normal appearance and bowel sounds are normal. There is no tenderness.  Neurological: She is alert. She has normal strength and normal reflexes. No sensory deficit. She displays a negative Romberg sign. Gait normal.  No focal muscle weakness  Skin: Skin is warm, dry and intact. No rash noted.  Psychiatric: Her speech is normal and behavior is normal. Judgment and thought content normal. Her mood appears not anxious. Cognition and memory are normal. She does not exhibit a depressed mood.          Assessment & Plan:

## 2013-08-28 NOTE — Assessment & Plan Note (Addendum)
UA appers clear.  will send for culture to verify negative.  Will eval with further labs to consider anemia, thyroid change, electrolyte imbalance etc.  No clear sign of caridac or pulmonary source at this time.  MAy be secondary to medications but no recent changes.

## 2013-09-21 ENCOUNTER — Encounter: Payer: Self-pay | Admitting: Cardiovascular Disease

## 2013-09-21 ENCOUNTER — Ambulatory Visit (INDEPENDENT_AMBULATORY_CARE_PROVIDER_SITE_OTHER): Payer: Medicare Other | Admitting: Cardiovascular Disease

## 2013-09-21 VITALS — BP 130/52 | HR 61 | Ht 62.0 in | Wt 141.2 lb

## 2013-09-21 DIAGNOSIS — R0602 Shortness of breath: Secondary | ICD-10-CM

## 2013-09-21 DIAGNOSIS — R0989 Other specified symptoms and signs involving the circulatory and respiratory systems: Secondary | ICD-10-CM

## 2013-09-21 DIAGNOSIS — I5032 Chronic diastolic (congestive) heart failure: Secondary | ICD-10-CM

## 2013-09-21 DIAGNOSIS — G4700 Insomnia, unspecified: Secondary | ICD-10-CM

## 2013-09-21 DIAGNOSIS — R5381 Other malaise: Secondary | ICD-10-CM

## 2013-09-21 DIAGNOSIS — R5383 Other fatigue: Secondary | ICD-10-CM

## 2013-09-21 DIAGNOSIS — I10 Essential (primary) hypertension: Secondary | ICD-10-CM

## 2013-09-21 DIAGNOSIS — R011 Cardiac murmur, unspecified: Secondary | ICD-10-CM

## 2013-09-21 DIAGNOSIS — R079 Chest pain, unspecified: Secondary | ICD-10-CM

## 2013-09-21 NOTE — Assessment & Plan Note (Signed)
Etiology is not clear, possibly from sedentary lifestyle. Encouraged her to stay active, walking with her daughter outside the house as long as she has support.

## 2013-09-21 NOTE — Assessment & Plan Note (Signed)
Insomnia could be contributing to her daily fatigue. Suggested she try Benadryl or melatonin, increase her walking during the daytime

## 2013-09-21 NOTE — Progress Notes (Signed)
Patient ID: Elizabeth Silva, female    DOB: 01-Aug-1918, 79 y.o.   MRN: [id number]  HPI Comments:  79 yo with history of difficult to control HTN, COPD, hyperlipidemia, left bundle-branch block, moderate tricuspid valve regurgitation since 2011 with normal ejection fraction in 2012, presents today for routine followup.     Previous hospital admission for sepsis, recurrent pneumonia, diagnosed with C. Difficile.  treated with Flagyl and diarrhea improved. Notes from the hospital indicate Klebsiella urinary tract infection treated with antibiotics, C. difficile improving on Flagyl, bradycardia, recurrent pneumonia that was improving on antibiotics, sepsis that was treated and improved, chronic kidney disease.   Her biggest complaint today is that she is tired. She has some difficulty going to sleep. She does do significant stairs daily, lives in a split level house. Reports having poor energy for the past 3-4 years. Has some heart problems, otherwise no significant changes. Has a cane but does not like to use it. No significant lower extremity edema.  Does not go out of the house much, once a week maybe goes to [employer]    Previous  Echo was suggestive of diastolic dysfunction with normal EF (60-65%). Moderate TR      EKG shows normal sinus rhythm with left bundle branch block, rate 61 beats per minute      Outpatient Encounter Prescriptions as of 09/21/2013  Medication Sig  . acetaminophen (TYLENOL) 500 MG tablet Take 500 mg by mouth every 6 (six) hours as needed.    Marland Kitchen amLODipine (NORVASC) 5 MG tablet Take 1 tablet (5 mg total) by mouth 2 (two) times daily.  Marland Kitchen aspirin 81 MG EC tablet Take 81 mg by mouth daily.    . fesoterodine (TOVIAZ) 8 MG TB24 Take 1 tablet (8 mg total) by mouth daily.  . fluticasone (FLONASE) 50 MCG/ACT nasal spray Place 1 spray into the nose daily.   . furosemide (LASIX) 20 MG tablet Take 20 mg by mouth daily as needed.  . gabapentin (NEURONTIN) 600 MG tablet Take 1 tablet  (600 mg total) by mouth at bedtime.  . hydrALAZINE (APRESOLINE) 100 MG tablet Take 1 tablet (100 mg total) by mouth 3 (three) times daily.  . nebivolol (BYSTOLIC) 10 MG tablet Take 1 tablet (10 mg total) by mouth daily.  Marland Kitchen omeprazole (PRILOSEC) 40 MG capsule Take 1 capsule (40 mg total) by mouth daily.  . Probiotic Product (PROBIOTIC DAILY PO) Take 1 capsule by mouth daily.  . Red Yeast Rice 600 MG CAPS Take 1 capsule by mouth daily.    Marland Kitchen tiotropium (SPIRIVA) 18 MCG inhalation capsule Place 1 capsule (18 mcg total) into inhaler and inhale daily.  . vitamin E 1000 UNIT capsule Take 1,000 Units by mouth daily.       Review of Systems  Constitutional: Positive for fatigue.  HENT: Negative.   Eyes: Negative.   Respiratory: Negative.   Cardiovascular: Negative.   Gastrointestinal: Negative.   Endocrine: Negative.   Musculoskeletal: Positive for gait problem.  Skin: Negative.   Allergic/Immunologic: Negative.   Neurological: Positive for weakness.  Hematological: Negative.   Psychiatric/Behavioral: Negative.   All other systems reviewed and are negative.   BP 130/52  Pulse 61  Ht 5\' 2"  (1.575 m)  Wt 141 lb 4 oz (64.071 kg)  BMI 25.83 kg/m2  Physical Exam  Nursing note and vitals reviewed. Constitutional: She is oriented to person, place, and time. She appears well-developed and well-nourished.  HENT:  Head: Normocephalic.  Nose: Nose normal.  Mouth/Throat:  Oropharynx is clear and moist.  Eyes: Conjunctivae are normal. Pupils are equal, round, and reactive to light.  Neck: Normal range of motion. Neck supple. No JVD present. Carotid bruit is present.  Cardiovascular: Normal rate, regular rhythm, S1 normal, S2 normal and intact distal pulses.  Exam reveals no gallop and no friction rub.   Murmur heard.  Crescendo systolic murmur is present with a grade of 2/6  Pulmonary/Chest: Effort normal and breath sounds normal. No respiratory distress. She has no wheezes. She has no rales.  She exhibits no tenderness.  Abdominal: Soft. Bowel sounds are normal. She exhibits no distension. There is no tenderness.  Musculoskeletal: Normal range of motion. She exhibits no edema and no tenderness.  Lymphadenopathy:    She has no cervical adenopathy.  Neurological: She is alert and oriented to person, place, and time. Coordination normal.  Skin: Skin is warm and dry. No rash noted. No erythema.  Psychiatric: She has a normal mood and affect. Her behavior is normal. Judgment and thought content normal.    Assessment and Plan

## 2013-09-21 NOTE — Assessment & Plan Note (Signed)
Blood pressure is well controlled on today's visit. No changes made to the medications. 

## 2013-09-21 NOTE — Patient Instructions (Signed)
You are doing well. No medication changes were made.  We will schedule a carotid ultrasound for a bruit on both sides  Please call us if you have new issues that need to be addressed before your next appt.  Your physician wants you to follow-up in: 6 months.  You will receive a reminder letter in the mail two months in advance. If you don't receive a letter, please call our office to schedule the follow-up appointment.

## 2013-09-21 NOTE — Assessment & Plan Note (Signed)
Euvolemic on today's visit

## 2013-09-21 NOTE — Assessment & Plan Note (Signed)
Murmur is not very prominent. Likely aortic valve sclerosis, possible aortic valve stenosis mild

## 2013-09-29 ENCOUNTER — Encounter (INDEPENDENT_AMBULATORY_CARE_PROVIDER_SITE_OTHER): Payer: Medicare Other

## 2013-09-29 DIAGNOSIS — I6529 Occlusion and stenosis of unspecified carotid artery: Secondary | ICD-10-CM

## 2013-09-29 DIAGNOSIS — R0989 Other specified symptoms and signs involving the circulatory and respiratory systems: Secondary | ICD-10-CM

## 2013-10-14 ENCOUNTER — Encounter: Payer: Self-pay | Admitting: Family Medicine

## 2013-10-14 ENCOUNTER — Ambulatory Visit (INDEPENDENT_AMBULATORY_CARE_PROVIDER_SITE_OTHER): Payer: Medicare Other | Admitting: Family Medicine

## 2013-10-14 VITALS — BP 160/70 | HR 70 | Temp 97.6°F | Wt 139.5 lb

## 2013-10-14 DIAGNOSIS — J441 Chronic obstructive pulmonary disease with (acute) exacerbation: Secondary | ICD-10-CM

## 2013-10-14 MED ORDER — PREDNISONE 20 MG PO TABS: ORAL_TABLET | ORAL | Status: DC

## 2013-10-14 MED ORDER — AZITHROMYCIN 250 MG PO TABS: ORAL_TABLET | ORAL | Status: DC

## 2013-10-14 NOTE — Patient Instructions (Signed)
Rest, fluids.  Complete a course of antibiotics.  If wheeze not continuing to improve in next 48-72 hours fill prescription for steroid taper.  Use albuterol inhaler every 4-6 hours as needed for SOB or wheeze.   Follow up in 1-2 weeks if not improivng, call sooner if worsening.  Go to severe shortness of breath.

## 2013-10-14 NOTE — Progress Notes (Signed)
Pre-visit discussion using our clinic review tool. No additional management support is needed unless otherwise documented below in the visit note.  

## 2013-10-14 NOTE — Assessment & Plan Note (Signed)
Treat with antibiotics, inhaler.  If not continued significant improvement in SOB/ wheeze... Start pred taper.  Follow up if not improving as expected.

## 2013-10-14 NOTE — Progress Notes (Signed)
   Subjective:    Patient ID: Elizabeth Silva, female    DOB: 11-07-1917, 79 y.o.   MRN: [id number]  HPI Comments: Hx of COPD, HTN, CKD On spiriva daily, occ skips dose doses.  URI  This is a new problem. The current episode started 1 to 4 weeks ago. The problem has been gradually worsening. There has been no fever (low grade 99.2). The fever has been present for less than 1 day. Associated symptoms include chest pain, coughing and wheezing. Pertinent negatives include no abdominal pain, congestion, dysuria, ear pain, nausea, plugged ear sensation, rash, sinus pain, sneezing or sore throat. Associated symptoms comments: SOB, cough, nonproductive. She has tried inhaler use (mucinex DM) for the symptoms. The treatment provided mild relief.      Review of Systems  HENT: Negative for congestion, ear pain, sneezing and sore throat.   Respiratory: Positive for cough, shortness of breath and wheezing.        SOB with exertion, none at rest  Cardiovascular: Positive for chest pain.  Gastrointestinal: Negative for nausea and abdominal pain.  Genitourinary: Negative for dysuria.  Skin: Negative for rash.       Objective:   Physical Exam  Constitutional: Vital signs are normal. She appears well-developed and well-nourished. She is cooperative.  Non-toxic appearance. She does not appear ill. No distress.  Nontoxic appearing, no resp distress.  HENT:  Head: Normocephalic.  Right Ear: Hearing, tympanic membrane, external ear and ear canal normal. Tympanic membrane is not erythematous, not retracted and not bulging.  Left Ear: Hearing, tympanic membrane, external ear and ear canal normal. Tympanic membrane is not erythematous, not retracted and not bulging.  Nose: Mucosal edema and rhinorrhea present. Right sinus exhibits no maxillary sinus tenderness and no frontal sinus tenderness. Left sinus exhibits no maxillary sinus tenderness and no frontal sinus tenderness.  Mouth/Throat: Uvula is midline,  oropharynx is clear and moist and mucous membranes are normal.  Eyes: Conjunctivae, EOM and lids are normal. Pupils are equal, round, and reactive to light. Lids are everted and swept, no foreign bodies found.  Neck: Trachea normal and normal range of motion. Neck supple. Carotid bruit is not present. No mass and no thyromegaly present.  Cardiovascular: Normal rate, regular rhythm, S1 normal, S2 normal, normal heart sounds, intact distal pulses and normal pulses.  Exam reveals no gallop and no friction rub.   No murmur heard. Pulmonary/Chest: Effort normal. Not tachypneic. No respiratory distress. She has no decreased breath sounds. She has wheezes. She has no rhonchi. She has no rales.  occ scattered wheeze.  Neurological: She is alert.  Skin: Skin is warm, dry and intact. No rash noted.  Psychiatric: Her speech is normal and behavior is normal. Judgment normal. Her mood appears not anxious. Cognition and memory are normal. She does not exhibit a depressed mood.          Assessment & Plan:

## 2013-10-16 ENCOUNTER — Telehealth: Payer: Self-pay

## 2013-10-16 MED ORDER — ALBUTEROL SULFATE HFA 108 (90 BASE) MCG/ACT IN AERS: 2.0000 | INHALATION_SPRAY | Freq: Four times a day (QID) | RESPIRATORY_TRACT | Status: DC | PRN

## 2013-10-16 NOTE — Telephone Encounter (Signed)
Collie Siad left v/m pt seen 10/14/13 and albuterol inhaler was to be sent to [facility]. [facility] does not have rx. Collie Siad request sent to [facility].

## 2013-10-16 NOTE — Telephone Encounter (Signed)
Rx sent as requested.

## 2013-10-19 ENCOUNTER — Encounter: Payer: Self-pay | Admitting: Family Medicine

## 2013-10-26 ENCOUNTER — Other Ambulatory Visit: Payer: Self-pay | Admitting: Family Medicine

## 2013-10-27 MED ORDER — TIOTROPIUM BROMIDE MONOHYDRATE 18 MCG IN CAPS: 18.0000 ug | ORAL_CAPSULE | Freq: Every day | RESPIRATORY_TRACT | Status: DC

## 2013-10-27 MED ORDER — FUROSEMIDE 20 MG PO TABS: 20.0000 mg | ORAL_TABLET | Freq: Every day | ORAL | Status: DC | PRN

## 2013-10-27 MED ORDER — FLUTICASONE PROPIONATE 50 MCG/ACT NA SUSP: 1.0000 | Freq: Every day | NASAL | Status: DC

## 2013-10-27 MED ORDER — HYDRALAZINE HCL 100 MG PO TABS: 100.0000 mg | ORAL_TABLET | Freq: Three times a day (TID) | ORAL | Status: DC

## 2013-10-27 MED ORDER — FESOTERODINE FUMARATE ER 8 MG PO TB24: 8.0000 mg | ORAL_TABLET | Freq: Every day | ORAL | Status: DC

## 2013-10-27 MED ORDER — OMEPRAZOLE 40 MG PO CPDR: 40.0000 mg | DELAYED_RELEASE_CAPSULE | Freq: Every day | ORAL | Status: DC

## 2013-10-27 MED ORDER — GABAPENTIN 600 MG PO TABS: 600.0000 mg | ORAL_TABLET | Freq: Every day | ORAL | Status: DC

## 2013-10-27 MED ORDER — NEBIVOLOL HCL 10 MG PO TABS: 10.0000 mg | ORAL_TABLET | Freq: Every day | ORAL | Status: DC

## 2013-10-27 MED ORDER — AMLODIPINE BESYLATE 5 MG PO TABS: 5.0000 mg | ORAL_TABLET | Freq: Two times a day (BID) | ORAL | Status: DC

## 2013-10-27 MED ORDER — ALBUTEROL SULFATE HFA 108 (90 BASE) MCG/ACT IN AERS: 2.0000 | INHALATION_SPRAY | Freq: Four times a day (QID) | RESPIRATORY_TRACT | Status: DC | PRN

## 2013-10-27 MED ORDER — FERROUS FUMARATE 325 (106 FE) MG PO TABS: 1.0000 | ORAL_TABLET | Freq: Every day | ORAL | Status: AC

## 2013-10-27 NOTE — Addendum Note (Signed)
Addended by: Carter Kitten on: 10/27/2013 12:01 PM   Modules accepted: Orders

## 2013-10-27 NOTE — Telephone Encounter (Signed)
Prescriptions printed and signed by Dr. Diona Browner.  Mailed to patient at [facility]  [place] [address].

## 2013-10-27 NOTE — Telephone Encounter (Signed)
Printed rx  

## 2013-10-30 ENCOUNTER — Encounter: Payer: Self-pay | Admitting: Family Medicine

## 2013-11-12 ENCOUNTER — Telehealth: Payer: Self-pay | Admitting: Family Medicine

## 2013-11-12 NOTE — Telephone Encounter (Signed)
Relevant patient education assigned to patient using Emmi. ° °

## 2013-11-25 ENCOUNTER — Other Ambulatory Visit: Payer: Self-pay

## 2013-11-25 MED ORDER — HYDRALAZINE HCL 100 MG PO TABS: 100.0000 mg | ORAL_TABLET | Freq: Three times a day (TID) | ORAL | Status: DC

## 2013-11-25 NOTE — Telephone Encounter (Signed)
Pt request hydralazine 50 mg taking 2 tabs three times a day # 60 sent to [facility] while waiting on rx from mail order. Spoke with pt and advised Hydralazine 100 mg taking one tab three times a day was [facility] and will send # 30 to [facility]. Pt voiced understanding of changing mg and how to take Hydralazine 100 mg. I spoke with [place] to advise change in mg also and they will make sure person picking up med understands to only take 1 tab three times a day of Hydralazine 100 mg.

## 2013-12-21 ENCOUNTER — Ambulatory Visit: Payer: Self-pay | Admitting: Podiatry

## 2013-12-29 ENCOUNTER — Telehealth: Payer: Self-pay | Admitting: *Deleted

## 2013-12-29 ENCOUNTER — Telehealth: Payer: Self-pay | Admitting: Family Medicine

## 2013-12-29 DIAGNOSIS — E7800 Pure hypercholesterolemia, unspecified: Secondary | ICD-10-CM

## 2013-12-29 DIAGNOSIS — I10 Essential (primary) hypertension: Secondary | ICD-10-CM

## 2013-12-29 MED ORDER — ALBUTEROL SULFATE HFA 108 (90 BASE) MCG/ACT IN AERS: 2.0000 | INHALATION_SPRAY | Freq: Four times a day (QID) | RESPIRATORY_TRACT | Status: DC | PRN

## 2013-12-29 NOTE — Telephone Encounter (Signed)
Reference # [id number].  Melissa called from pharmacy and wants to know if it okay to change to Proair because of the coverage and Albuterol is not covered. Please advise.

## 2013-12-29 NOTE — Telephone Encounter (Signed)
Proair Rx sent electronically to [facility].

## 2013-12-29 NOTE — Telephone Encounter (Signed)
Yes.. Okay to change to proair. Send in rx if needed.. Same as albuterol.

## 2013-12-29 NOTE — Telephone Encounter (Signed)
Message copied by Jinny Sanders on Tue Dec 29, 2013 11:02 PM ------      Message from: Selinda Orion J      Created: Wed Dec 23, 2013  3:48 PM      Regarding: Lab orders for Wednesday, 3.18.15       Patient is scheduled for CPX labs, please order future labs, Thanks , Terri       ------

## 2013-12-30 ENCOUNTER — Other Ambulatory Visit (INDEPENDENT_AMBULATORY_CARE_PROVIDER_SITE_OTHER): Payer: Medicare Other

## 2013-12-30 DIAGNOSIS — E7800 Pure hypercholesterolemia, unspecified: Secondary | ICD-10-CM

## 2013-12-30 DIAGNOSIS — I10 Essential (primary) hypertension: Secondary | ICD-10-CM

## 2013-12-30 LAB — COMPREHENSIVE METABOLIC PANEL
ALT: 12 U/L (ref 0–35)
AST: 16 U/L (ref 0–37)
Albumin: 4.1 g/dL (ref 3.5–5.2)
Alkaline Phosphatase: 49 U/L (ref 39–117)
BILIRUBIN TOTAL: 0.5 mg/dL (ref 0.3–1.2)
BUN: 66 mg/dL — AB (ref 6–23)
CO2: 22 meq/L (ref 19–32)
CREATININE: 1.8 mg/dL — AB (ref 0.4–1.2)
Calcium: 9.5 mg/dL (ref 8.4–10.5)
Chloride: 111 mEq/L (ref 96–112)
GFR: 28.2 mL/min — AB (ref >60.00)
GLUCOSE: 108 mg/dL — AB (ref 70–99)
Potassium: 4.3 mEq/L (ref 3.5–5.1)
Sodium: 142 mEq/L (ref 135–145)
TOTAL PROTEIN: 7.3 g/dL (ref 6.0–8.3)

## 2013-12-30 LAB — LIPID PANEL
CHOLESTEROL: 193 mg/dL (ref 0–200)
HDL: 44.3 mg/dL (ref >39.00)
LDL Cholesterol: 116 mg/dL — ABNORMAL HIGH (ref 0–99)
Total CHOL/HDL Ratio: 4
Triglycerides: 166 mg/dL — ABNORMAL HIGH (ref 0.0–149.0)
VLDL: 33.2 mg/dL (ref 0.0–40.0)

## 2014-01-04 ENCOUNTER — Encounter: Payer: Self-pay | Admitting: Podiatry

## 2014-01-04 ENCOUNTER — Ambulatory Visit (INDEPENDENT_AMBULATORY_CARE_PROVIDER_SITE_OTHER): Payer: Medicare Other | Admitting: Podiatry

## 2014-01-04 VITALS — Ht 61.0 in | Wt 136.0 lb

## 2014-01-04 DIAGNOSIS — M79609 Pain in unspecified limb: Secondary | ICD-10-CM

## 2014-01-04 DIAGNOSIS — M2010 Hallux valgus (acquired), unspecified foot: Secondary | ICD-10-CM

## 2014-01-04 DIAGNOSIS — B351 Tinea unguium: Secondary | ICD-10-CM

## 2014-01-04 NOTE — Progress Notes (Signed)
   Subjective:    Patient ID: Elizabeth Silva, female    DOB: 1918-02-02, 80 y.o.   MRN: [id number]  HPI Comments: i need to have my toenails clipped. Thick painful      Review of Systems  HENT: Positive for hearing loss.   Respiratory: Positive for shortness of breath and wheezing.   Musculoskeletal: Positive for back pain.       Joint pain  difficulty walking    Neurological: Positive for light-headedness.  Hematological: Bruises/bleeds easily.  All other systems reviewed and are negative.       Objective:   Physical Exam: I reviewed her past medical history medications allergies surgeries and social history. Pulses are palpable bilateral. Neurologic sensorium is intact. Orthopedic evaluation demonstrates all joints distal to the ankle a full range of motion without crepitation. Cutaneous evaluation demonstrates supple well hydrated cutis with exception of thick yellow dystrophic lytic mycotic nails which are incurvated second digit of the right foot demonstrates a reactive distal porokeratotic lesion without complication.        Assessment & Plan:  Assessment: Pain in limb secondary to onychomycosis and distal clavus.  Plan: Debridement of nails 1 through 5 bilateral covered service secondary to pain followup with her as needed.

## 2014-01-07 ENCOUNTER — Encounter: Payer: Self-pay | Admitting: Family Medicine

## 2014-01-07 ENCOUNTER — Ambulatory Visit (INDEPENDENT_AMBULATORY_CARE_PROVIDER_SITE_OTHER): Payer: Medicare Other | Admitting: Family Medicine

## 2014-01-07 VITALS — BP 124/60 | HR 58 | Temp 97.7°F | Ht 61.5 in | Wt 142.2 lb

## 2014-01-07 DIAGNOSIS — N184 Chronic kidney disease, stage 4 (severe): Secondary | ICD-10-CM

## 2014-01-07 DIAGNOSIS — M25569 Pain in unspecified knee: Secondary | ICD-10-CM

## 2014-01-07 DIAGNOSIS — M25562 Pain in left knee: Secondary | ICD-10-CM

## 2014-01-07 DIAGNOSIS — J441 Chronic obstructive pulmonary disease with (acute) exacerbation: Secondary | ICD-10-CM

## 2014-01-07 DIAGNOSIS — M25552 Pain in left hip: Secondary | ICD-10-CM

## 2014-01-07 DIAGNOSIS — R7309 Other abnormal glucose: Secondary | ICD-10-CM

## 2014-01-07 DIAGNOSIS — M25559 Pain in unspecified hip: Secondary | ICD-10-CM

## 2014-01-07 DIAGNOSIS — Z0000 Encounter for general adult medical examination without abnormal findings: Secondary | ICD-10-CM

## 2014-01-07 DIAGNOSIS — Z23 Encounter for immunization: Secondary | ICD-10-CM

## 2014-01-07 DIAGNOSIS — R7303 Prediabetes: Secondary | ICD-10-CM

## 2014-01-07 DIAGNOSIS — M25561 Pain in right knee: Secondary | ICD-10-CM

## 2014-01-07 MED ORDER — PREDNISONE 20 MG PO TABS: ORAL_TABLET | ORAL | Status: DC

## 2014-01-07 NOTE — Patient Instructions (Addendum)
Work on low carb, low sweets diet.  Increase red yeast rice to 2400 mg divided daily. (ie. 2 tabs twice daily)  Start prednisone taper. Stop at front desk for referral to [facility].  Follow up  Next Tuesday, for lung recheck.

## 2014-01-07 NOTE — Progress Notes (Signed)
Pre visit review using our clinic review tool, if applicable. No additional management support is needed unless otherwise documented below in the visit note. 

## 2014-01-07 NOTE — Assessment & Plan Note (Signed)
Stable

## 2014-01-07 NOTE — Assessment & Plan Note (Signed)
Info given on low sugar diet.

## 2014-01-07 NOTE — Assessment & Plan Note (Signed)
No definite bacterial infeciton. Treat with steroid taper, albuterol, continue spiriva.  Close follow up

## 2014-01-07 NOTE — Assessment & Plan Note (Addendum)
Likely secondary  to OA. Refer to ortho for likely steroid injections/ X-ray Etc.  Starting prednisone for lungs.. This will likely help OA pain.  No sign of fracture or meniscal tear on exam.

## 2014-01-07 NOTE — Progress Notes (Signed)
I have personally reviewed the Medicare Annual Wellness questionnaire and have noted  1. The patient's medical and social history  2. Their use of alcohol, tobacco or illicit drugs  3. Their current medications and supplements  4. The patient's functional ability including ADL's, fall risks, home safety risks and hearing or visual  impairment.  5. Diet and physical activities  6. Evidence for depression or mood disorders  The patients weight, height, BMI and visual acuity have been recorded in the chart  I have made referrals, counseling and provided education to the patient based review of the above and I have provided the pt with a written personalized care plan for preventive services.    She has been having increased shortness of breath, wheeze and coughing. Has been worse in last 3 -4 weeks. No fever. Occ productive, white.. This is different for her.  COPD.Marland Kitchen Using spiriva but only every other day.   Using albuterol twice daily, when well not using at all.  B knee pain occuring when getting up and down.  Right knee worse than left. Some stiffness. No new fall. Does not have ortho MD. Using OC arthritis med... Helps minimaly.   Hypertension: Well controlled on current medication  BP Readings from Last 3 Encounters:  01/07/14 124/60  10/14/13 160/70  09/21/13 130/52  Using medication without problems or lightheadedness: None  Chest pain with exertion:None  Edema:stable wearing compression hose.  Short of breath:yes Average home BPs: 136/60-70 Other issues:   Diastolic CHF, euvolemic.. Followed by cardiology, Dr. Rockey Situ.. Last seen in 09/2013.   noted reviewed.Euvolemic.  Elevated Cholesterol: Improved control on red yeast rice 2400 mg daily for past 2 month. LDL not at goal <70.  Lab Results  Component Value Date   CHOL 193 12/30/2013   HDL 44.30 12/30/2013   LDLCALC 116* 12/30/2013   LDLDIRECT 122.1 08/17/2010   TRIG 166.0* 12/30/2013   CHOLHDL 4 12/30/2013  Using  medications without problems:None  Muscle aches: None  Diet compliance: Good  Exercise:None, limited  Other complaints:   Insomnia, off and on: 1-2 times a month. Does not nap.  Trouble falling asleep.  Denies depression, anxiety.  Gets up once a night to urinate.  Using tylenol PM one tablet a night... No SE. This helps minimally.    CKD, stable. Has not seen nephrology in a while.  She is not interested in returning.    Prediabetes  Review of Systems  Constitutional: Negative for fever, fatigue and unexpected weight change. , no blood noted in stool, urine, vaginal etc.  HENT: Negative for ear pain, congestion, sore throat, sneezing, trouble swallowing and sinus pressure.  Dry mouth  Eyes: Negative for pain and itching.  Respiratory: see above. Cardiovascular: Negative for chest pain, palpitations and leg swelling.  Gastrointestinal: Negative for nausea, abdominal pain, diarrhea, constipation and blood in stool.  Genitourinary: Negative for dysuria, hematuria, vaginal discharge, difficulty urinating and menstrual problem. Occ vaginal irritation, using estrogen cream. Concerned about uti. Some urinary frequency, and increase in odor of urine.  Skin: Negative for rash.  Neurological: Negative for syncope, weakness, light-headedness, numbness and headaches.  Psychiatric/Behavioral: Negative for confusion and dysphoric mood. The patient is not nervous/anxious.  Objective:   Physical Exam  Constitutional: Vital signs are normal. She appears well-developed and well-nourished. She is cooperative. Non-toxic appearance. She does not appear ill. No distress.  Elderly female in NAD  HENT:  Head: Normocephalic.  Right Ear: Hearing, tympanic membrane, external ear and ear canal normal. Tympanic  membrane is not erythematous, not retracted and not bulging.  Left Ear: Hearing, tympanic membrane, external ear and ear canal normal. Tympanic membrane is not erythematous, not retracted and not  bulging.  Nose: No mucosal edema or rhinorrhea. Right sinus exhibits no maxillary sinus tenderness and no frontal sinus tenderness. Left sinus exhibits no maxillary sinus tenderness and no frontal sinus tenderness.  Mouth/Throat: Uvula is midline, oropharynx is clear and moist and mucous membranes are normal.  Eyes: Conjunctivae, EOM and lids are normal. Pupils are equal, round, and reactive to light. No foreign bodies found.  Neck: Trachea normal and normal range of motion. Neck supple. Carotid bruit is not present. No mass and no thyromegaly present.  Cardiovascular: Normal rate, regular rhythm, S1 normal, S2 normal, normal heart sounds, intact distal pulses and normal pulses. Exam reveals no gallop and no friction rub.  No murmur heard.  Pulmonary/Chest: Effort normal. Not tachypneic. No respiratory distress. She has no decreased breath sounds. She has wheezes. She has no rhonchi. She has no rales. She exhibits no tenderness.  Diffuse intermittant wheeze  Abdominal: Soft. Normal appearance and bowel sounds are normal. There is no tenderness. There is no CVA tenderness.  Neurological: She is alert.  Skin: Skin is warm, dry and intact. No rash noted.  Psychiatric: Her speech is normal and behavior is normal. Judgment and thought content normal. Her mood appears not anxious. Cognition and memory are normal. She does not exhibit a depressed mood.   MSK: B ttp over jpoint line, neg Mcmurray, neg ant post drawer, decreased ROM, no effusion but B enlargement of B knees. Assessment & Plan:   AMW: The patient's preventative maintenance and recommended screening tests for an annual wellness exam were reviewed in full today.  Brought up to date unless services declined.  Counselled on the importance of diet, exercise, and its role in overall health and mortality.  The patient's FH and SH was reviewed, including their home life, tobacco status, and drug and alcohol status.   Vaccines: Uptodate with flu,  Td, zoster PNA. Due for prevnar. DXA: last in 2013 normal.  No indication for mammo, breast exam, pelvic/pap, colonoscopy at this age.

## 2014-01-12 ENCOUNTER — Encounter: Payer: Self-pay | Admitting: Family Medicine

## 2014-01-12 ENCOUNTER — Ambulatory Visit (INDEPENDENT_AMBULATORY_CARE_PROVIDER_SITE_OTHER)
Admission: RE | Admit: 2014-01-12 | Discharge: 2014-01-12 | Disposition: A | Discharge status: Home / Self Care | Payer: Medicare Other | Source: Ambulatory Visit | Attending: Family Medicine | Admitting: Family Medicine

## 2014-01-12 ENCOUNTER — Ambulatory Visit (INDEPENDENT_AMBULATORY_CARE_PROVIDER_SITE_OTHER): Payer: Medicare Other | Admitting: Family Medicine

## 2014-01-12 VITALS — BP 148/55 | HR 59 | Temp 98.0°F | Ht 61.75 in | Wt 142.5 lb

## 2014-01-12 DIAGNOSIS — J441 Chronic obstructive pulmonary disease with (acute) exacerbation: Secondary | ICD-10-CM

## 2014-01-12 DIAGNOSIS — R9389 Abnormal findings on diagnostic imaging of other specified body structures: Secondary | ICD-10-CM

## 2014-01-12 DIAGNOSIS — R05 Cough: Secondary | ICD-10-CM

## 2014-01-12 DIAGNOSIS — R059 Cough, unspecified: Secondary | ICD-10-CM

## 2014-01-12 DIAGNOSIS — R0602 Shortness of breath: Secondary | ICD-10-CM

## 2014-01-12 NOTE — Assessment & Plan Note (Signed)
Concerning history as new lung sounds  > on right lung.

## 2014-01-12 NOTE — Patient Instructions (Signed)
We will call with X-ray results. Go to ER if severe shortness of breath.

## 2014-01-12 NOTE — Assessment & Plan Note (Signed)
Concerning given rhonchi new in right lung, scattered wheeze. Repeat CXr today.

## 2014-01-12 NOTE — Progress Notes (Signed)
   Subjective:    Patient ID: Elizabeth Silva, female    DOB: April 04, 1918, 80 y.o.   MRN: [id number]  HPI  80 year old female presents for follow up COPD exacerbation.  She was seen on 3/26 and started on prednisone taper , but no antibiotics given felt viral trigger.  She uses albuterol prn and daily spiriva. She is still with shortness of breath, tightness. Not much better, but no worse.  Coughing, productive and congestion.  Using albuterol once a day.  02 96 %   Prednisone helped her knee pain  at lot. She still  Has upcoming ortho appt 4/8.     Review of Systems  Constitutional: Negative for fever and fatigue.  HENT: Negative for ear pain.   Eyes: Negative for pain.  Respiratory: Negative for chest tightness and shortness of breath.   Cardiovascular: Negative for chest pain, palpitations and leg swelling.  Gastrointestinal: Negative for abdominal pain.  Genitourinary: Negative for dysuria.       Objective:   Physical Exam  Constitutional: Vital signs are normal. She appears well-developed and well-nourished. She is cooperative.  Non-toxic appearance. She does not appear ill. No distress.  HENT:  Head: Normocephalic.  Right Ear: Hearing, tympanic membrane, external ear and ear canal normal. Tympanic membrane is not erythematous, not retracted and not bulging.  Left Ear: Hearing, tympanic membrane, external ear and ear canal normal. Tympanic membrane is not erythematous, not retracted and not bulging.  Nose: No mucosal edema or rhinorrhea. Right sinus exhibits no maxillary sinus tenderness and no frontal sinus tenderness. Left sinus exhibits no maxillary sinus tenderness and no frontal sinus tenderness.  Mouth/Throat: Uvula is midline, oropharynx is clear and moist and mucous membranes are normal.  Eyes: Conjunctivae, EOM and lids are normal. Pupils are equal, round, and reactive to light. Lids are everted and swept, no foreign bodies found.  Neck: Trachea normal and normal  range of motion. Neck supple. Carotid bruit is not present. No mass and no thyromegaly present.  Cardiovascular: Normal rate, regular rhythm, S1 normal, S2 normal, normal heart sounds, intact distal pulses and normal pulses.  Exam reveals no gallop and no friction rub.   No murmur heard. Pulmonary/Chest: Effort normal. Not tachypneic. No respiratory distress. She has no decreased breath sounds. She has wheezes. She has rhonchi in the right middle field, the right lower field and the left lower field. She has no rales.  Abdominal: Soft. Normal appearance and bowel sounds are normal. There is no tenderness.  Neurological: She is alert.  Skin: Skin is warm, dry and intact. No rash noted.  Psychiatric: Her speech is normal and behavior is normal. Judgment and thought content normal. Her mood appears not anxious. Cognition and memory are normal. She does not exhibit a depressed mood.          Assessment & Plan:

## 2014-01-12 NOTE — Assessment & Plan Note (Signed)
No better on prednisone as expected, but no sign of bacterial infection.

## 2014-01-12 NOTE — Progress Notes (Signed)
Pre visit review using our clinic review tool, if applicable. No additional management support is needed unless otherwise documented below in the visit note. 

## 2014-01-13 MED ORDER — FLUTICASONE-SALMETEROL 250-50 MCG/DOSE IN AEPB: 1.0000 | INHALATION_SPRAY | Freq: Two times a day (BID) | RESPIRATORY_TRACT | Status: DC

## 2014-01-13 NOTE — Addendum Note (Signed)
Addended by: Carter Kitten on: 01/13/2014 09:01 AM   Modules accepted: Orders

## 2014-01-14 ENCOUNTER — Telehealth: Payer: Self-pay | Admitting: Family Medicine

## 2014-01-14 NOTE — Telephone Encounter (Signed)
Message copied by Jinny Sanders on Thu Jan 14, 2014  1:31 PM ------      Message from: Carter Kitten      Created: Wed Jan 13, 2014  9:00 AM       Patient notified as instructed by telephone.  She is agreeable to starting Advair 250/50.      Follow up appointment scheduled for 02/02/2014 @ 2:45pm with Dr. Diona Browner. ------

## 2014-02-02 ENCOUNTER — Ambulatory Visit: Payer: Medicare Other | Admitting: Family Medicine

## 2014-02-05 ENCOUNTER — Ambulatory Visit (INDEPENDENT_AMBULATORY_CARE_PROVIDER_SITE_OTHER): Payer: Medicare Other | Admitting: Family Medicine

## 2014-02-05 ENCOUNTER — Encounter: Payer: Self-pay | Admitting: Family Medicine

## 2014-02-05 VITALS — BP 142/55 | HR 68 | Temp 98.1°F | Ht 61.75 in | Wt 137.0 lb

## 2014-02-05 DIAGNOSIS — R9389 Abnormal findings on diagnostic imaging of other specified body structures: Secondary | ICD-10-CM

## 2014-02-05 DIAGNOSIS — J449 Chronic obstructive pulmonary disease, unspecified: Secondary | ICD-10-CM

## 2014-02-05 DIAGNOSIS — J441 Chronic obstructive pulmonary disease with (acute) exacerbation: Secondary | ICD-10-CM

## 2014-02-05 DIAGNOSIS — R0602 Shortness of breath: Secondary | ICD-10-CM

## 2014-02-05 NOTE — Patient Instructions (Signed)
Continue current medications including spiriva and advair.  Follow up in 3 months for routine check.

## 2014-02-05 NOTE — Assessment & Plan Note (Signed)
Improved  symptoms on advair and spiriva.  Spirometry normal. Continue.

## 2014-02-05 NOTE — Assessment & Plan Note (Signed)
Lungs now clear on exam and  CXR stable over time with scarring RML>  NO clear re-eval needed unless SOB declining.

## 2014-02-05 NOTE — Progress Notes (Signed)
   Subjective:    Patient ID: Elizabeth Silva, female    DOB: 29-Dec-1917, 80 y.o.   MRN: [id number]  HPI  80 year old female with recent COPD exacerbation presents for follow up.  She was seen on 3/26 and started on prednisone taper , but no antibiotics given felt viral trigger.  She uses albuterol prn and daily spiriva.  She is still with shortness of breath, tightness. Not much better, but no worse.  Coughing, productive and congestion.  Using albuterol once a day. Seen again on 3/31:  CXR stable scarring  Started Advair 250/50 on 01/14/2014 in addition to spiriva. She has noted some improvement, about 25 % better SOB, phelgm moviung more. Has note used albuterol prn.  No fever.  Few days of abdominal pain, after stopped OTC vitamins, improved.    Review of Systems  Constitutional: Negative for fever and fatigue.  HENT: Negative for ear pain.   Eyes: Negative for pain.  Respiratory: Positive for cough and shortness of breath. Negative for chest tightness and wheezing.        Occ cough  Cardiovascular: Negative for chest pain, palpitations and leg swelling.  Gastrointestinal: Negative for abdominal pain.  Genitourinary: Negative for dysuria.       Objective:   Physical Exam  Constitutional: Vital signs are normal. She appears well-developed and well-nourished. She is cooperative.  Non-toxic appearance. She does not appear ill. No distress.  Elderly female in NAD  HENT:  Head: Normocephalic.  Right Ear: Hearing, tympanic membrane, external ear and ear canal normal. Tympanic membrane is not erythematous, not retracted and not bulging.  Left Ear: Hearing, tympanic membrane, external ear and ear canal normal. Tympanic membrane is not erythematous, not retracted and not bulging.  Nose: No mucosal edema or rhinorrhea. Right sinus exhibits no maxillary sinus tenderness and no frontal sinus tenderness. Left sinus exhibits no maxillary sinus tenderness and no frontal sinus tenderness.    Mouth/Throat: Uvula is midline, oropharynx is clear and moist and mucous membranes are normal.  Eyes: Conjunctivae, EOM and lids are normal. Pupils are equal, round, and reactive to light. Lids are everted and swept, no foreign bodies found.  Neck: Trachea normal and normal range of motion. Neck supple. Carotid bruit is not present. No mass and no thyromegaly present.  Cardiovascular: Normal rate, regular rhythm, S1 normal, S2 normal, normal heart sounds, intact distal pulses and normal pulses.  Exam reveals no gallop and no friction rub.   No murmur heard. Pulmonary/Chest: Effort normal and breath sounds normal. Not tachypneic. No respiratory distress. She has no decreased breath sounds. She has no wheezes. She has no rhonchi. She has no rales.  Abdominal: Soft. Normal appearance and bowel sounds are normal. There is no tenderness.  Neurological: She is alert.  Skin: Skin is warm, dry and intact. No rash noted.  Psychiatric: Her speech is normal and behavior is normal. Judgment and thought content normal. Her mood appears not anxious. Cognition and memory are normal. She does not exhibit a depressed mood.          Assessment & Plan:

## 2014-02-05 NOTE — Assessment & Plan Note (Signed)
Likely multifactorial from COPD, heart issues ( no fluid overload currently), age and deconditioning.

## 2014-02-05 NOTE — Progress Notes (Signed)
Pre visit review using our clinic review tool, if applicable. No additional management support is needed unless otherwise documented below in the visit note. 

## 2014-02-16 ENCOUNTER — Telehealth: Payer: Self-pay

## 2014-02-16 MED ORDER — TOLTERODINE TARTRATE ER 4 MG PO CP24: 4.0000 mg | ORAL_CAPSULE | Freq: Every day | ORAL | Status: DC

## 2014-02-16 NOTE — Telephone Encounter (Signed)
Pt has gone into the donut hole and Toviaz will cost pt $325.00 for 3 months.pt request less expensive med to replace Toviaz to [facility]. Pt request cb.

## 2014-02-16 NOTE — Telephone Encounter (Signed)
Patient notified as instructed by telephone. 

## 2014-02-16 NOTE — Telephone Encounter (Signed)
.  Notify pt med change made and rx sent to mail order.

## 2014-02-22 ENCOUNTER — Other Ambulatory Visit: Payer: Self-pay | Admitting: Family Medicine

## 2014-02-22 ENCOUNTER — Telehealth: Payer: Self-pay | Admitting: Family Medicine

## 2014-02-22 MED ORDER — HYDRALAZINE HCL 100 MG PO TABS: 100.0000 mg | ORAL_TABLET | Freq: Three times a day (TID) | ORAL | Status: DC

## 2014-02-22 NOTE — Telephone Encounter (Signed)
Ms. Hawbaker did not need refill on hydralzaine.  Please see comment below that.  She is actually needing Oxybutyin 5 mg.  Detrol is no longer covered by her insurance.

## 2014-02-22 NOTE — Telephone Encounter (Signed)
Rx sent to pharm

## 2014-02-23 ENCOUNTER — Encounter: Payer: Self-pay | Admitting: *Deleted

## 2014-02-23 MED ORDER — OXYBUTYNIN CHLORIDE 5 MG PO TABS: 5.0000 mg | ORAL_TABLET | Freq: Two times a day (BID) | ORAL | Status: DC

## 2014-02-23 NOTE — Telephone Encounter (Signed)
See below note.

## 2014-02-23 NOTE — Telephone Encounter (Signed)
MyChart message sent to patient with information below as instructed by Dr. Diona Browner.

## 2014-02-23 NOTE — Telephone Encounter (Signed)
Rx for generic sent in but let pt and daughter know.., this generic not ysued frequently due to being short acting and often needing to take it 4-5 times a day to be effective. Also more SE usually with this med.  She can try it but let me now if issues with it... Can increase to more frequently in day  if needed.

## 2014-03-29 ENCOUNTER — Encounter: Payer: Self-pay | Admitting: Cardiovascular Disease

## 2014-03-29 ENCOUNTER — Ambulatory Visit (INDEPENDENT_AMBULATORY_CARE_PROVIDER_SITE_OTHER): Payer: Medicare Other | Admitting: Cardiovascular Disease

## 2014-03-29 VITALS — BP 140/52 | HR 67 | Ht 61.0 in | Wt 137.8 lb

## 2014-03-29 DIAGNOSIS — E7800 Pure hypercholesterolemia, unspecified: Secondary | ICD-10-CM

## 2014-03-29 DIAGNOSIS — R5383 Other fatigue: Secondary | ICD-10-CM

## 2014-03-29 DIAGNOSIS — I10 Essential (primary) hypertension: Secondary | ICD-10-CM

## 2014-03-29 DIAGNOSIS — R5381 Other malaise: Secondary | ICD-10-CM

## 2014-03-29 DIAGNOSIS — I5032 Chronic diastolic (congestive) heart failure: Secondary | ICD-10-CM

## 2014-03-29 DIAGNOSIS — R0989 Other specified symptoms and signs involving the circulatory and respiratory systems: Secondary | ICD-10-CM

## 2014-03-29 NOTE — Assessment & Plan Note (Signed)
Blood pressure is well controlled on today's visit. No changes made to the medications. 

## 2014-03-29 NOTE — Patient Instructions (Signed)
You are doing well. No medication changes were made.  Please call us if you have new issues that need to be addressed before your next appt.  Your physician wants you to follow-up in: 6 months.  You will receive a reminder letter in the mail two months in advance. If you don't receive a letter, please call our office to schedule the follow-up appointment.   

## 2014-03-29 NOTE — Assessment & Plan Note (Signed)
40% carotid arterial disease

## 2014-03-29 NOTE — Progress Notes (Signed)
Patient ID: Elizabeth Silva, female    DOB: 27-Dec-1917, 80 y.o.   MRN: [id number]  HPI Comments: 80 yo with history of malignant HTN, COPD, hyperlipidemia, left bundle-branch block, moderate tricuspid valve regurgitation since 2011 with normal ejection fraction in 2012, presents today for routine followup.    In followup today, she reports that she is doing well. She uses compression hose frequently for prior history of leg edema. She denies any significant edema, no abnormal swelling, no significant shortness of breath with exertion. Continues to live independently. No recent falls.  She continues to have chronic fatigue. She lives on a split level, uses the stairs frequently. Chronic fatigue/poor energy poor several years. She's not using her cane. Does not go out of the house much, once a week maybe goes to [facility] admission for sepsis, recurrent pneumonia, diagnosed with C. Difficile.  treated with Flagyl and diarrhea improved. Notes from the hospital indicate Klebsiella urinary tract infection treated with antibiotics, C. difficile improving on Flagyl, bradycardia, recurrent pneumonia that was improving on antibiotics, sepsis that was treated and improved, chronic kidney disease.    Previous  Echo was suggestive of diastolic dysfunction with normal EF (60-65%). Moderate TR      EKG shows normal sinus rhythm with left bundle branch block, rate 67 beats per minute      Outpatient Encounter Prescriptions as of 03/29/2014  Medication Sig  . acetaminophen (TYLENOL) 500 MG tablet Take 500 mg by mouth every 6 (six) hours as needed.    Marland Kitchen albuterol (PROAIR HFA) 108 (90 BASE) MCG/ACT inhaler Inhale 2 puffs into the lungs every 6 (six) hours as needed for wheezing or shortness of breath.  Marland Kitchen amLODipine (NORVASC) 5 MG tablet Take 1 tablet (5 mg total) by mouth 2 (two) times daily.  Marland Kitchen aspirin 81 MG EC tablet Take 81 mg by mouth daily.    . ferrous fumarate (HEMOCYTE - 106 MG FE) 325  (106 FE) MG TABS tablet Take 1 tablet (106 mg of iron total) by mouth daily.  . fluticasone (FLONASE) 50 MCG/ACT nasal spray Place 1 spray into both nostrils daily.  . Fluticasone-Salmeterol (ADVAIR DISKUS) 250-50 MCG/DOSE AEPB Inhale 1 puff into the lungs 2 (two) times daily.  . furosemide (LASIX) 20 MG tablet Take 1 tablet (20 mg total) by mouth daily as needed.  . gabapentin (NEURONTIN) 600 MG tablet Take 1 tablet (600 mg total) by mouth at bedtime.  . hydrALAZINE (APRESOLINE) 100 MG tablet Take 1 tablet (100 mg total) by mouth 3 (three) times daily.  . nebivolol (BYSTOLIC) 10 MG tablet Take 1 tablet (10 mg total) by mouth daily.  Marland Kitchen omeprazole (PRILOSEC) 40 MG capsule Take 1 capsule (40 mg total) by mouth daily.  Marland Kitchen oxybutynin (DITROPAN) 5 MG tablet Take 1 tablet (5 mg total) by mouth 2 (two) times daily.  . Probiotic Product (PROBIOTIC DAILY PO) Take 1 capsule by mouth daily.  . Red Yeast Rice 600 MG CAPS Take 2 capsules by mouth 2 (two) times daily.   Marland Kitchen tiotropium (SPIRIVA) 18 MCG inhalation capsule Place 1 capsule (18 mcg total) into inhaler and inhale daily.  . vitamin E 1000 UNIT capsule Take 1,000 Units by mouth daily.      Review of Systems  Constitutional: Positive for fatigue.  HENT: Negative.   Eyes: Negative.   Respiratory: Negative.   Cardiovascular: Negative.   Gastrointestinal: Negative.   Endocrine: Negative.   Musculoskeletal: Positive for gait problem.  Skin: Negative.  Allergic/Immunologic: Negative.   Neurological: Positive for weakness.  Hematological: Negative.   Psychiatric/Behavioral: Negative.   All other systems reviewed and are negative.  BP 140/52  Pulse 67  Ht 5\' 1"  (1.549 m)  Wt 137 lb 12 oz (62.483 kg)  BMI 26.04 kg/m2  Physical Exam  Nursing note and vitals reviewed. Constitutional: She is oriented to person, place, and time. She appears well-developed and well-nourished.  HENT:  Head: Normocephalic.  Nose: Nose normal.  Mouth/Throat:  Oropharynx is clear and moist.  Eyes: Conjunctivae are normal. Pupils are equal, round, and reactive to light.  Neck: Normal range of motion. Neck supple. No JVD present. Carotid bruit is present.  Cardiovascular: Normal rate, regular rhythm, S1 normal, S2 normal and intact distal pulses.  Exam reveals no gallop and no friction rub.   Murmur heard.  Crescendo systolic murmur is present with a grade of 2/6  Pulmonary/Chest: Effort normal and breath sounds normal. No respiratory distress. She has no wheezes. She has no rales. She exhibits no tenderness.  Abdominal: Soft. Bowel sounds are normal. She exhibits no distension. There is no tenderness.  Musculoskeletal: Normal range of motion. She exhibits no edema and no tenderness.  Lymphadenopathy:    She has no cervical adenopathy.  Neurological: She is alert and oriented to person, place, and time. Coordination normal.  Skin: Skin is warm and dry. No rash noted. No erythema.  Psychiatric: She has a normal mood and affect. Her behavior is normal. Judgment and thought content normal.    Assessment and Plan

## 2014-03-29 NOTE — Assessment & Plan Note (Signed)
She appears relatively euvolemic on today's visit. She does not take Lasix

## 2014-03-29 NOTE — Assessment & Plan Note (Signed)
He continues to report having chronic fatigue. Symptoms are about the same as last year.  Suspect this is from age, deconditioning. No further testing at this time

## 2014-03-29 NOTE — Assessment & Plan Note (Signed)
She takes red yeast rice.

## 2014-04-05 ENCOUNTER — Ambulatory Visit (INDEPENDENT_AMBULATORY_CARE_PROVIDER_SITE_OTHER): Payer: Medicare Other | Admitting: Podiatry

## 2014-04-05 ENCOUNTER — Encounter: Payer: Self-pay | Admitting: Podiatry

## 2014-04-05 DIAGNOSIS — M79673 Pain in unspecified foot: Secondary | ICD-10-CM

## 2014-04-05 DIAGNOSIS — B351 Tinea unguium: Secondary | ICD-10-CM

## 2014-04-05 DIAGNOSIS — M79609 Pain in unspecified limb: Secondary | ICD-10-CM

## 2014-04-05 NOTE — Progress Notes (Signed)
She presents today chief complaint of painful elongated toenails.  Objective: Pulses are palpable bilateral. Nails are thick yellow dystrophic onychomycotic painful palpation with distal clavus second digit right foot.  Assessment: Pain in limb secondary to onychomycosis and porokeratosis.  Plan: Debridement of nails 1 through 5 bilateral is cover service secondary to pain. Debridement of all reactive hyperkeratosis.

## 2014-04-20 ENCOUNTER — Telehealth: Payer: Self-pay | Admitting: Family Medicine

## 2014-04-20 ENCOUNTER — Encounter: Payer: Self-pay | Admitting: Family Medicine

## 2014-04-20 ENCOUNTER — Ambulatory Visit (INDEPENDENT_AMBULATORY_CARE_PROVIDER_SITE_OTHER): Payer: Medicare Other | Admitting: Family Medicine

## 2014-04-20 VITALS — BP 124/60 | HR 70 | Temp 98.2°F | Wt 136.8 lb

## 2014-04-20 DIAGNOSIS — H9319 Tinnitus, unspecified ear: Secondary | ICD-10-CM

## 2014-04-20 DIAGNOSIS — M25562 Pain in left knee: Principal | ICD-10-CM

## 2014-04-20 DIAGNOSIS — M25569 Pain in unspecified knee: Secondary | ICD-10-CM

## 2014-04-20 DIAGNOSIS — M25561 Pain in right knee: Secondary | ICD-10-CM

## 2014-04-20 DIAGNOSIS — H9313 Tinnitus, bilateral: Secondary | ICD-10-CM | POA: Insufficient documentation

## 2014-04-20 DIAGNOSIS — I10 Essential (primary) hypertension: Secondary | ICD-10-CM

## 2014-04-20 MED ORDER — FLUTICASONE PROPIONATE 50 MCG/ACT NA SUSP: 2.0000 | Freq: Every day | NASAL | Status: DC

## 2014-04-20 MED ORDER — DICLOFENAC SODIUM 3 % TD GEL: TRANSDERMAL | Status: DC

## 2014-04-20 NOTE — Assessment & Plan Note (Signed)
No red flags. Not on any ototoxic meds.  Likely due to hearing loss. Has appt to have hearing aids adjusted.  Saw ENT in past felt no concern.

## 2014-04-20 NOTE — Assessment & Plan Note (Signed)
Stop meloxicam as ineffective.  trial of voltaren gel.

## 2014-04-20 NOTE — Patient Instructions (Addendum)
Stop meloxicam.  Change diclofenac gel on knees  3-4 times a day as needed.  If not improving , return to [facility] for possible injection. Follow up in 3 months 30 min OV.

## 2014-04-20 NOTE — Assessment & Plan Note (Signed)
Well controlled. Continue current medication.  

## 2014-04-20 NOTE — Telephone Encounter (Signed)
Relevant patient education assigned to patient using Emmi. ° °

## 2014-04-20 NOTE — Progress Notes (Signed)
   Subjective:    Patient ID: Elizabeth Silva, female    DOB: 06/23/1918, 80 y.o.   MRN: [id number]  Knee Pain     80 year old female with hx of HTN, CKD, COPD, PVD, high chol presents for 3 month follow up.   Hypertension: well controlled on hydralazine, amlodipine ,lasix. BP Readings from Last 3 Encounters:  04/20/14 124/60  03/29/14 140/52  02/05/14 142/55  Using medication without problems or lightheadedness: occ if bending over Chest pain with exertion:None Edema:None Short of breath: Occ Average home BPs: 120/70 Other issues: tinnitus/roaring in both years, low pitch 2 years, some better. No heartbeat sound. Moderate hearing loss, gradually worsening. Wearing hearing aids in both ears.     She saw Dr. Sabra Heck for B knee pain. Started on meloxicam 7.5 mg  daily. X-rays  Showed little arthritis. Tired knee braces but this hurt knee more.  She has not tried voltaren gel.       Review of Systems  Constitutional: Negative for fever and fatigue.  HENT: Negative for ear pain.   Eyes: Negative for pain.  Respiratory: Negative for chest tightness and shortness of breath.   Cardiovascular: Negative for chest pain, palpitations and leg swelling.  Gastrointestinal: Negative for abdominal pain.  Genitourinary: Negative for dysuria.       Objective:   Physical Exam  Constitutional: Vital signs are normal. She appears well-developed and well-nourished. She is cooperative.  Non-toxic appearance. She does not appear ill. No distress.  Elderly female in NAD  HENT:  Head: Normocephalic.  Right Ear: Hearing, tympanic membrane, external ear and ear canal normal. Tympanic membrane is not erythematous, not retracted and not bulging.  Left Ear: Hearing, tympanic membrane, external ear and ear canal normal. Tympanic membrane is not erythematous, not retracted and not bulging.  Nose: No mucosal edema or rhinorrhea. Right sinus exhibits no maxillary sinus tenderness and no frontal  sinus tenderness. Left sinus exhibits no maxillary sinus tenderness and no frontal sinus tenderness.  Mouth/Throat: Uvula is midline, oropharynx is clear and moist and mucous membranes are normal.  Eyes: Conjunctivae, EOM and lids are normal. Pupils are equal, round, and reactive to light. Lids are everted and swept, no foreign bodies found.  Neck: Trachea normal and normal range of motion. Neck supple. Carotid bruit is not present. No mass and no thyromegaly present.  Cardiovascular: Normal rate, regular rhythm, S1 normal, S2 normal, normal heart sounds, intact distal pulses and normal pulses.  Exam reveals no gallop and no friction rub.   No murmur heard. Pulmonary/Chest: Effort normal and breath sounds normal. Not tachypneic. No respiratory distress. She has no decreased breath sounds. She has no wheezes. She has no rhonchi. She has no rales.  Abdominal: Soft. Normal appearance and bowel sounds are normal. There is no tenderness.  Musculoskeletal:       Right knee: She exhibits decreased range of motion. Tenderness found.       Left knee: She exhibits decreased range of motion. Tenderness found.  Neurological: She is alert.  Skin: Skin is warm, dry and intact. No rash noted.  Psychiatric: Her speech is normal and behavior is normal. Judgment and thought content normal. Her mood appears not anxious. Cognition and memory are normal. She does not exhibit a depressed mood.          Assessment & Plan:

## 2014-04-20 NOTE — Progress Notes (Signed)
Pre visit review using our clinic review tool, if applicable. No additional management support is needed unless otherwise documented below in the visit note. 

## 2014-04-28 ENCOUNTER — Telehealth: Payer: Self-pay | Admitting: *Deleted

## 2014-04-28 NOTE — Telephone Encounter (Signed)
Received Prior Authorization from [facility] for Diclofenac Gel 3%.  Form placed in Dr. Rometta Emery in box to complete.

## 2014-04-29 ENCOUNTER — Other Ambulatory Visit: Payer: Self-pay | Admitting: Family Medicine

## 2014-04-29 MED ORDER — DICLOFENAC SODIUM 1 % TD GEL: 4.0000 g | Freq: Four times a day (QID) | TRANSDERMAL | Status: DC

## 2014-04-29 NOTE — Telephone Encounter (Addendum)
PA faxed to [facility] [phone].  Diclofenac 3% topical gel denied because the use in not supported by the FDA or by one of the Medicare approved references for treating bilateral knee pain.

## 2014-04-29 NOTE — Telephone Encounter (Signed)
Changed to 1%.  Sent in Rx.Hopefully will be covered now.

## 2014-04-30 NOTE — Telephone Encounter (Signed)
Pt called for status of PA for gel; explained to pt Dr Diona Browner changed to the 1% and sent rx on 04/29/14. Pt voiced understanding.

## 2014-05-10 ENCOUNTER — Other Ambulatory Visit: Payer: Self-pay

## 2014-05-10 MED ORDER — DICLOFENAC SODIUM 1 % TD GEL: 4.0000 g | Freq: Four times a day (QID) | TRANSDERMAL | Status: DC

## 2014-05-10 NOTE — Telephone Encounter (Signed)
Elizabeth Silva notified prescription has been sent to [facility].

## 2014-05-10 NOTE — Telephone Encounter (Signed)
Pt left v/m pt was notified by [facility] that they cannot get the diclofenac 1 % gel and advise pt to get from local pharmacy. Pt request cb when sent to [facility].

## 2014-06-11 IMAGING — CT NM PET TUM IMG LTD AREA
5 series · 23 of 25 positions shown · non-contrast
Comparison: none

REASON FOR EXAM: lung cancer right lung mass
COMMENTS:

[Series 3: ct wb 3.0 b30f · axial · 3.0mm · 0.98mm/px · z∈[-1352,-602]mm · 9 of 376 slices shown]
[im 1/376  soft-tissue]
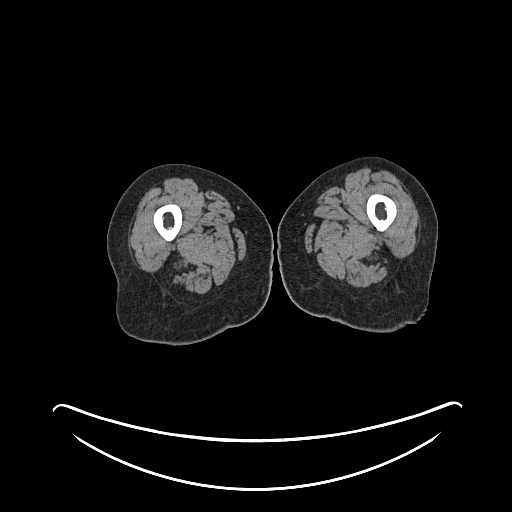
[im 42/376  soft-tissue]
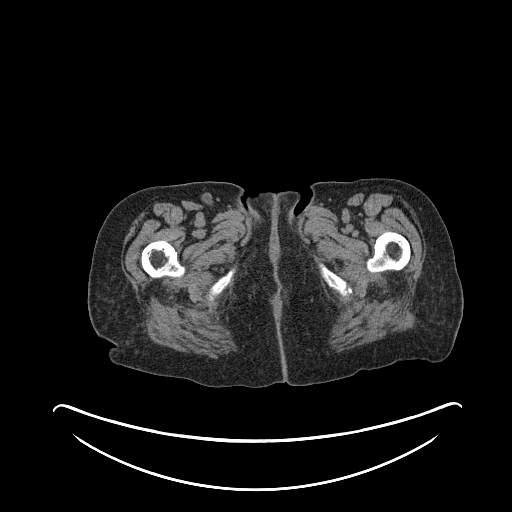
[im 84/376  soft-tissue]
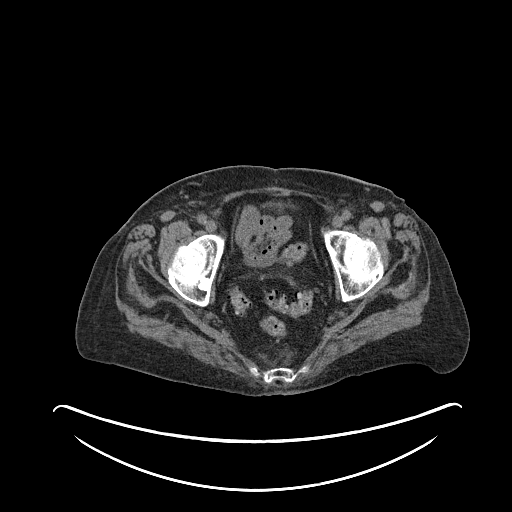
[im 126/376  soft-tissue]
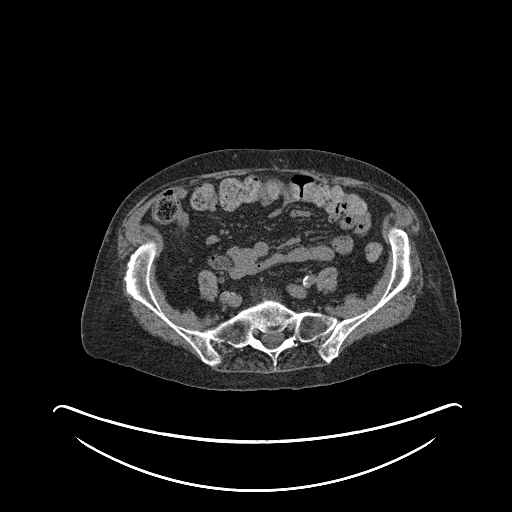
[im 167/376  soft-tissue]
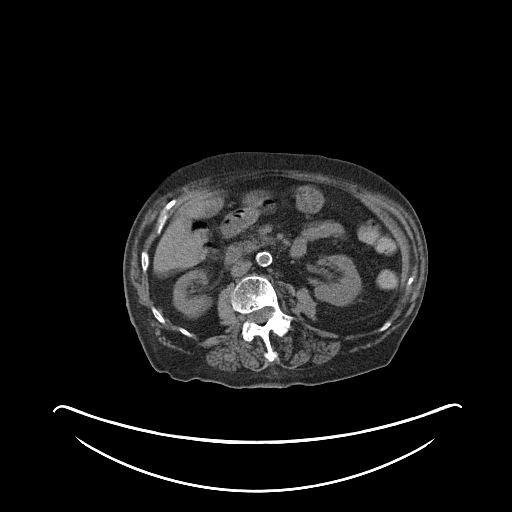
[im 209/376  soft-tissue]
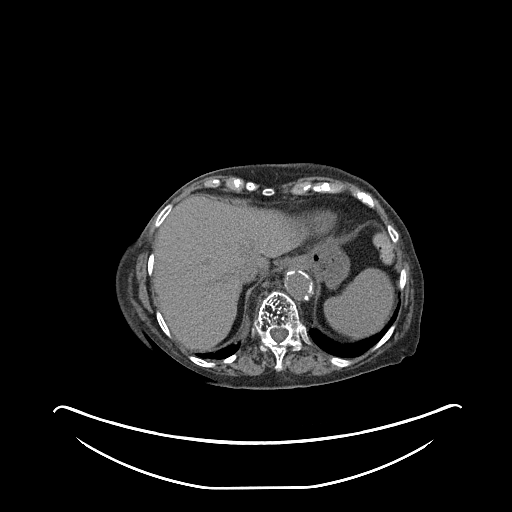
[im 292/376  soft-tissue]
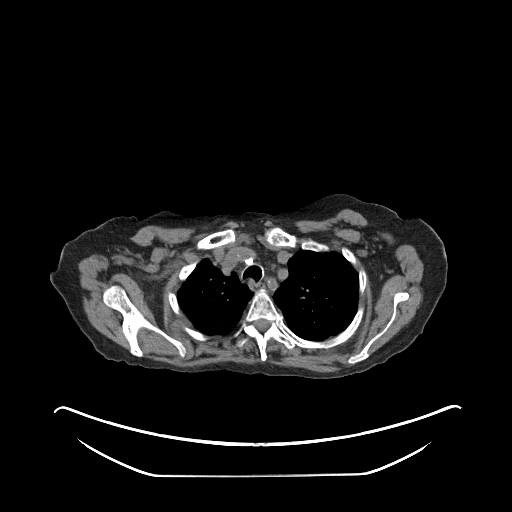
[im 334/376  soft-tissue]
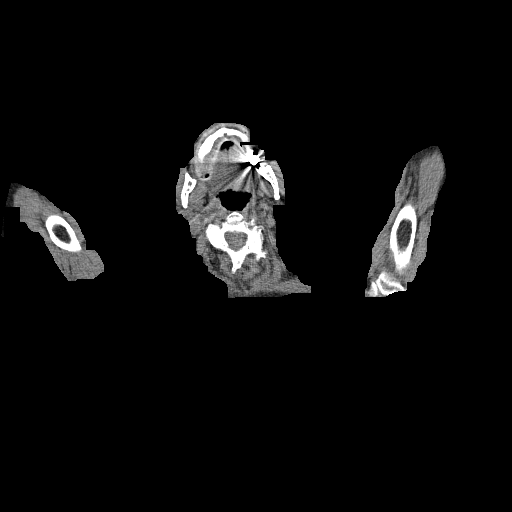
[im 376/376  soft-tissue]
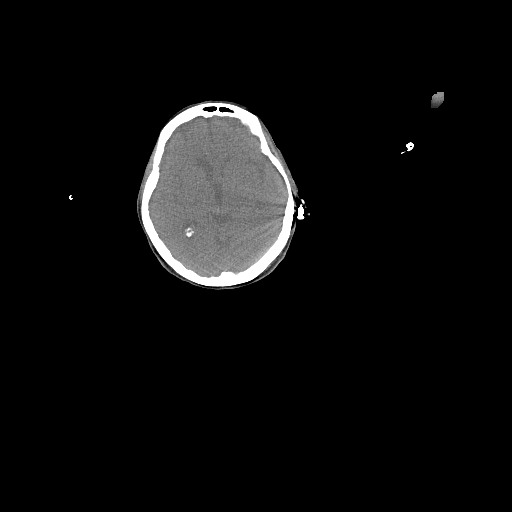

[Series 102: pet wb · axial · 5.0mm · 4.07mm/px · z∈[-1352,-602]mm · 7 of 251 slices shown]
[im 1/251]
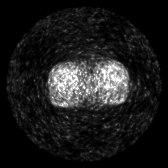
[im 42/251]
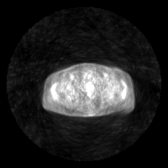
[im 84/251]
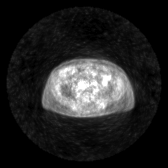
[im 126/251]
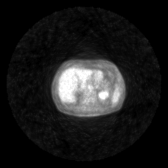
[im 167/251]
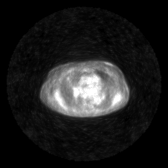
[im 209/251]
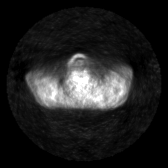
[im 251/251]
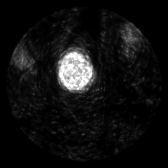

[Series 803: pet axial · 3 of 147 slices shown]
[im 1/147]
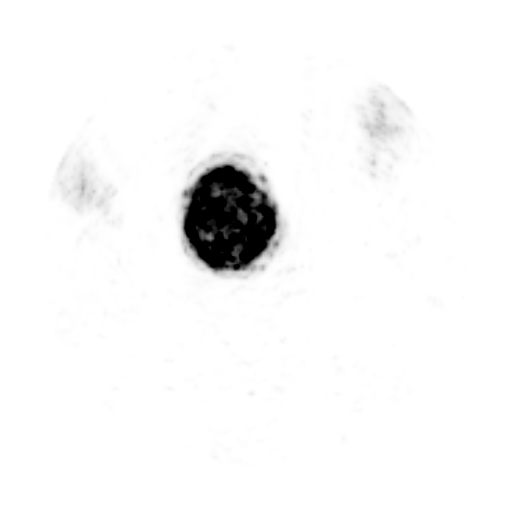
[im 98/147]
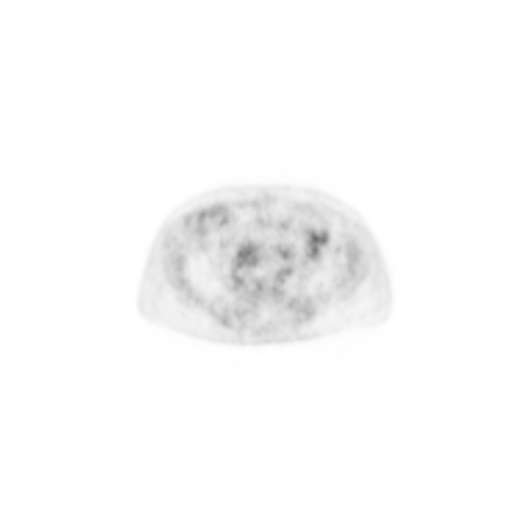
[im 147/147]
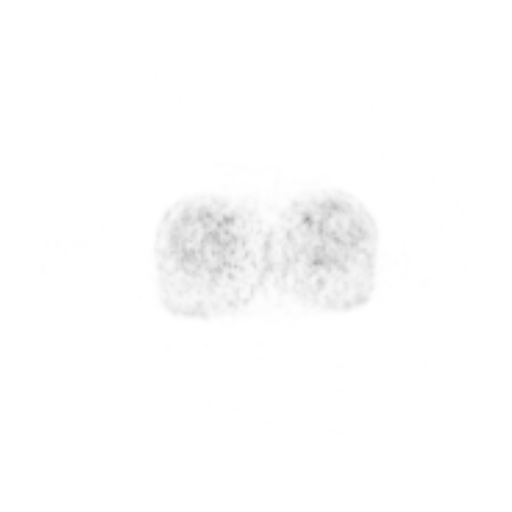

[Series 804: pet coronal · 2 of 64 slices shown]
[im 1/64]
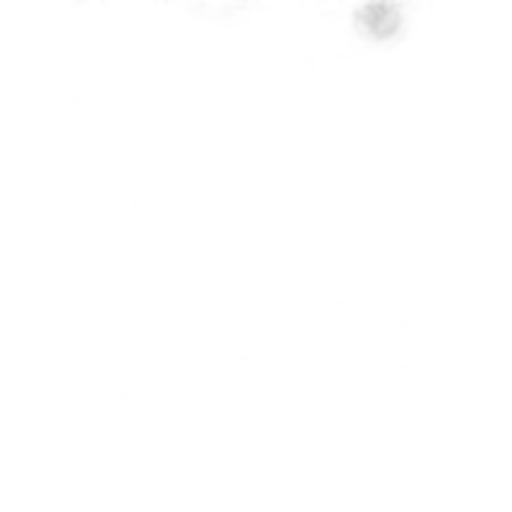
[im 64/64]
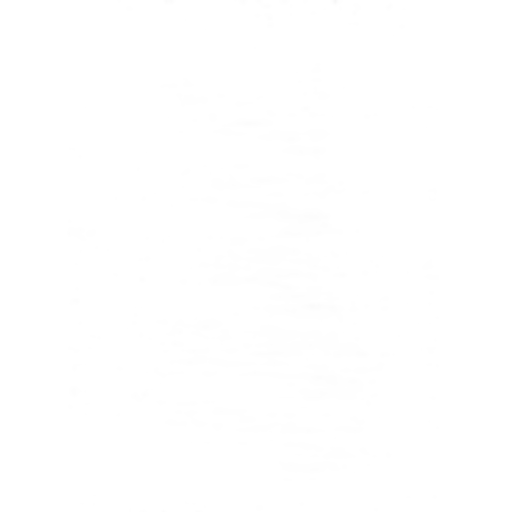

[Series 805: pet sagittal · 2 of 89 slices shown]
[im 1/89]
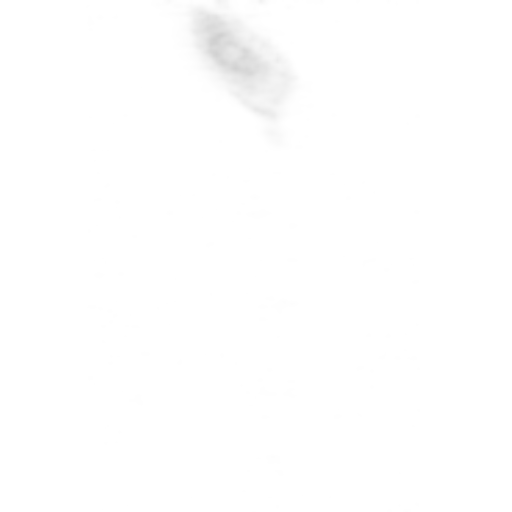
[im 89/89]
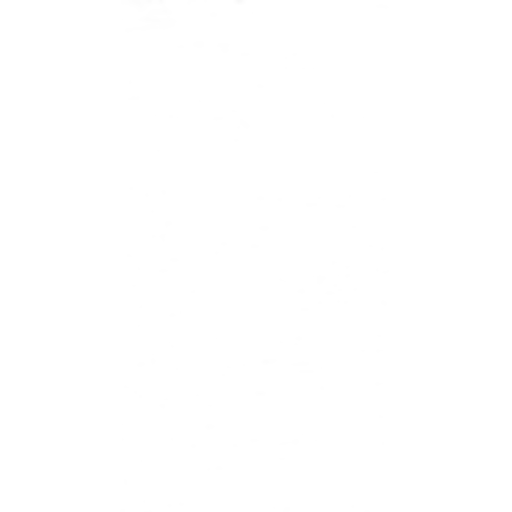

[23 of 25 positions shown; findings below may reference images not displayed]

PROCEDURE:     PET - PET/CT DX LUNG CA  - May 07, 2012  [DATE]

RESULT:     The patient has a fasting blood glucose level measuring 100
mg/dL. The patient received an injection of 12.01 mCi of fluorine 18 labeled
fluorodeoxyglucose in the left antecubital region at [DATE] p.m. with imaging
from the base of the brain into the thighs between the hours of [DATE] and
[DATE]. There is a history of a right lung mass. Noncontrast low-dose CT is
performed through the same regions as the PET acquisition for the purposes
of attenuation correction and fusion. The noncontrast CT, attenuation
corrected PET images and fused PET/CT data are reconstructed by the [employer] software in the axial, coronal and sagittal planes by the [employer]
software. A rotating three-dimensional maximum intensity projection image is
also created. Comparison is made to the diagnostic chest x-ray dated [facility]

The area of consolidation in the right lung shows tracer localization
similar to that seen in the mediastinum. Correlate for possible atelectasis.
This has a maximum SUV of 2.75. There is some uptake within a pretracheal to
precarinal lymph node slightly to the right of midline showing a maximum
uptake of 3.29 SUV. Subcarinal increased localization has an SUV of
maximum. There is fairly significant uptake within the musculature along the
posterior lateral thoracic region. Atherosclerotic calcification is present.
There is uptake in the right lung apex area of soft tissue density measuring
2.63 maximum SUV. There is rather diffuse localization within the bowel and
liver.
IMPRESSION: 1. Uptake in the area of consolidation in the right lung in the middle lobe
is similar to that of the mediastinum. Atelectasis or resolving infiltrate
could be considered. Mild uptake within mediastinal lymph nodes is also
nonspecific. Patchy area of slightly increased localization in the right
lung near the apex does not definitely correlate with significant soft
tissue density. There is some mild pleural thickening in density at the
apex. This could represent an area of misregistration or could represent
some mild pneumonitis.
2. Diffusely increased uptake within the liver and multiple loops of bowel
is nonspecific. There is some muscular uptake along the chest wall and in
the area the hips. This is diffuse and nonfocal. 3. Degenerative changes are
noted in the spine.

[REDACTED]

## 2014-06-14 ENCOUNTER — Telehealth: Payer: Self-pay

## 2014-06-14 NOTE — Telephone Encounter (Signed)
Pt would like Bystolic samples.

## 2014-06-14 NOTE — Telephone Encounter (Signed)
Placed samples of Bystolic 10 mg @ front desk for pick up.

## 2014-07-05 ENCOUNTER — Telehealth: Payer: Self-pay

## 2014-07-05 ENCOUNTER — Ambulatory Visit (INDEPENDENT_AMBULATORY_CARE_PROVIDER_SITE_OTHER): Payer: Medicare Other | Admitting: Podiatry

## 2014-07-05 DIAGNOSIS — M79673 Pain in unspecified foot: Secondary | ICD-10-CM

## 2014-07-05 DIAGNOSIS — B351 Tinea unguium: Secondary | ICD-10-CM

## 2014-07-05 DIAGNOSIS — M79609 Pain in unspecified limb: Secondary | ICD-10-CM

## 2014-07-05 NOTE — Telephone Encounter (Signed)
Placed samples of Bystolic 10 mg at front desk for pick up.

## 2014-07-05 NOTE — Progress Notes (Signed)
She presents today with a chief complaint of painful elongated toenails one through 5 bilateral.  Objective: Pulses are strongly palpable bilateral. Nails are thick yellow dystrophic and mycotic and painful palpation.  Assessment: Pain in limb secondary onychomycosis 1 through 5 bilateral.  Plan: Debridement of nails 1 through 5 bilateral covered service secondary to pain.

## 2014-07-05 NOTE — Telephone Encounter (Signed)
Pt would like Bystolic samples.

## 2014-07-20 ENCOUNTER — Encounter: Payer: Self-pay | Admitting: Family Medicine

## 2014-07-20 ENCOUNTER — Ambulatory Visit (INDEPENDENT_AMBULATORY_CARE_PROVIDER_SITE_OTHER): Payer: Medicare Other | Admitting: Family Medicine

## 2014-07-20 VITALS — BP 165/61 | HR 62 | Temp 97.6°F | Ht 61.0 in | Wt 134.0 lb

## 2014-07-20 DIAGNOSIS — M25562 Pain in left knee: Secondary | ICD-10-CM

## 2014-07-20 DIAGNOSIS — R1032 Left lower quadrant pain: Secondary | ICD-10-CM

## 2014-07-20 DIAGNOSIS — N390 Urinary tract infection, site not specified: Secondary | ICD-10-CM | POA: Insufficient documentation

## 2014-07-20 DIAGNOSIS — Z23 Encounter for immunization: Secondary | ICD-10-CM

## 2014-07-20 DIAGNOSIS — R1031 Right lower quadrant pain: Secondary | ICD-10-CM

## 2014-07-20 DIAGNOSIS — M25561 Pain in right knee: Secondary | ICD-10-CM

## 2014-07-20 DIAGNOSIS — N3000 Acute cystitis without hematuria: Secondary | ICD-10-CM

## 2014-07-20 LAB — POCT URINALYSIS DIPSTICK
Bilirubin, UA: NEGATIVE
Glucose, UA: NEGATIVE
KETONES UA: NEGATIVE
LEUKOCYTES UA: NEGATIVE
Nitrite, UA: NEGATIVE
RBC UA: NEGATIVE
Spec Grav, UA: 1.015
Urobilinogen, UA: 0.2
pH, UA: 6

## 2014-07-20 NOTE — Assessment & Plan Note (Signed)
Most likely secondary to constipation. Increase water, can hold iron. Start miralax, increase fiber.  Follow up if pain not resolving for further eval.

## 2014-07-20 NOTE — Progress Notes (Signed)
80 year old female with hx of HTN, CKD, COPD, PVD, high chol presents for 3 month follow up.   She has had burning in bottom of stomach and feel ill before BMs. Feels much better after BM. More constipation tham previously, using stool softners which has helped.  No pain with urination. No fever.  Sept 5 th has UTI, resolved now after cipro at walk in.  Hypertension: moderately controlled on hydralazine, amlodipine,bystolic,  lasix as needed.  BP Readings from Last 3 Encounters:  07/20/14 165/61  04/20/14 124/60  03/29/14 140/52  Using medication without problems or lightheadedness: occ if bending over  Chest pain with exertion:None  Edema:Occ Short of breath: None Average home BPs: not checking  She saw Dr. Sabra Heck for B OA knee pain.  Tired knee braces but this hurt knee more.  Tried voltaren gel.  Helped minimally.  Considering shots steroids in knee. She is tolerating pain at this time.  Review of Systems  Constitutional: Negative for fever and fatigue.  HENT: Negative for ear pain.  Eyes: Negative for pain.  Respiratory: Negative for chest tightness and shortness of breath.  Cardiovascular: Negative for chest pain, palpitations and leg swelling.  Gastrointestinal: Negative for abdominal pain.  Genitourinary: Negative for dysuria.  Objective:   Physical Exam  Constitutional: Vital signs are normal. She appears well-developed and well-nourished. She is cooperative. Non-toxic appearance. She does not appear ill. No distress.  Elderly female in NAD  HENT:  Head: Normocephalic.  Right Ear: Hearing, tympanic membrane, external ear and ear canal normal. Tympanic membrane is not erythematous, not retracted and not bulging.  Left Ear: Hearing, tympanic membrane, external ear and ear canal normal. Tympanic membrane is not erythematous, not retracted and not bulging.  Nose: No mucosal edema or rhinorrhea. Right sinus exhibits no maxillary sinus tenderness and no frontal sinus  tenderness. Left sinus exhibits no maxillary sinus tenderness and no frontal sinus tenderness.  Mouth/Throat: Uvula is midline, oropharynx is clear and moist and mucous membranes are normal.  Eyes: Conjunctivae, EOM and lids are normal. Pupils are equal, round, and reactive to light. Lids are everted and swept, no foreign bodies found.  Neck: Trachea normal and normal range of motion. Neck supple. Carotid bruit is not present. No mass and no thyromegaly present.  Cardiovascular: Normal rate, regular rhythm, S1 normal, S2 normal, normal heart sounds, intact distal pulses and normal pulses. Exam reveals no gallop and no friction rub.  No murmur heard.  Pulmonary/Chest: Effort normal and breath sounds normal. Not tachypneic. No respiratory distress. She has no decreased breath sounds. She has no wheezes. She has no rhonchi. She has no rales.  Abdominal: Soft. Normal appearance and bowel sounds are normal. There is no tenderness.  Musculoskeletal:  Right knee: She exhibits decreased range of motion. Tenderness found.  Left knee: She exhibits decreased range of motion. Tenderness found.  Neurological: She is alert.  Skin: Skin is warm, dry and intact. No rash noted.  Psychiatric: Her speech is normal and behavior is normal. Judgment and thought content normal. Her mood appears not anxious. Cognition and memory are normal. She does not exhibit a depressed mood.

## 2014-07-20 NOTE — Assessment & Plan Note (Signed)
Tolerable at this pnt on no med. Voltaren gel cost prohibitive and minimally helpful.

## 2014-07-20 NOTE — Patient Instructions (Addendum)
If knee pain worsening consider getting steroid injections with Dr. Sabra Heck. Check BP at home occ, goal < 150/90. Increase water as able, 48 ounce daily. Start miralax daily.

## 2014-07-20 NOTE — Progress Notes (Signed)
Pre visit review using our clinic review tool, if applicable. No additional management support is needed unless otherwise documented below in the visit note. 

## 2014-07-20 NOTE — Assessment & Plan Note (Signed)
UA shows UTI cleared with cipro.

## 2014-07-27 ENCOUNTER — Other Ambulatory Visit: Payer: Self-pay | Admitting: Family Medicine

## 2014-09-28 ENCOUNTER — Ambulatory Visit: Payer: Medicare Other | Admitting: Cardiovascular Disease

## 2014-09-29 ENCOUNTER — Ambulatory Visit: Payer: Medicare Other | Admitting: Podiatry

## 2014-10-04 ENCOUNTER — Telehealth: Payer: Self-pay | Admitting: Family Medicine

## 2014-10-04 ENCOUNTER — Ambulatory Visit (INDEPENDENT_AMBULATORY_CARE_PROVIDER_SITE_OTHER): Payer: Medicare Other | Admitting: Family Medicine

## 2014-10-04 ENCOUNTER — Encounter: Payer: Self-pay | Admitting: Family Medicine

## 2014-10-04 VITALS — BP 144/72 | HR 68 | Temp 97.7°F | Ht 61.0 in | Wt 132.5 lb

## 2014-10-04 DIAGNOSIS — J440 Chronic obstructive pulmonary disease with acute lower respiratory infection: Secondary | ICD-10-CM

## 2014-10-04 DIAGNOSIS — J441 Chronic obstructive pulmonary disease with (acute) exacerbation: Secondary | ICD-10-CM

## 2014-10-04 MED ORDER — PREDNISONE 20 MG PO TABS: ORAL_TABLET | ORAL | Status: DC

## 2014-10-04 MED ORDER — AZITHROMYCIN 250 MG PO TABS: ORAL_TABLET | ORAL | Status: DC

## 2014-10-04 NOTE — Progress Notes (Signed)
Dr. Frederico Hamman T. Aaryan Essman, MD, [facility] [place], [address] Phone: [phone] Fax: [phone]  10/04/2014  Patient: Elizabeth Silva, MRN: [id number], DOB: [birth year], 80 y.o.  Primary Physician:  Eliezer Lofts, MD  Chief Complaint: Cough; Fever; and Shortness of Breath  Subjective:   Elizabeth Silva is a 80 y.o. very pleasant female patient who presents with the following:  Yesterday had a temperature of 102, and then started to feel sick. Mostly in chest, some runny nose.  Pleasant elderly patient who I remember who has a history of COPD who uses Advair as well as Spiriva, and she presents with a MAXIMUM TEMPERATURE of 102, with some significant rattling in her chest and shortness of breath. Primary pulmonary complaints with some mild runny nose.  She is eating okay, but diminished a little bit.   Past Medical History, Surgical History, Social History, Family History, Problem List, Medications, and Allergies have been reviewed and updated if relevant.  ROS: GEN: Acute illness details above GI: Tolerating PO intake GU: maintaining adequate hydration and urination Pulm: No SOB Interactive and getting along well at home.  Otherwise, ROS is as per the HPI.   Objective:   BP 144/72 mmHg  Pulse 68  Temp(Src) 97.7 F (36.5 C) (Oral)  Ht 5\' 1"  (1.549 m)  Wt 132 lb 8 oz (60.102 kg)  BMI 25.05 kg/m2  SpO2 93%   GEN: A and O x 3. WDWN. NAD.    ENT: Nose clear, ext NML.  No LAD.  No JVD.  TM's clear. Oropharynx clear.  PULM: Normal WOB, no distress. Diffuse B rhonchi with mild wheezing CV: RRR, no M/G/R, No rubs, No JVD.   EXT: warm and well-perfused, No c/c/e. PSYCH: Pleasant and conversant.    Laboratory and Imaging Data:  Assessment and Plan:   COPD exacerbation  Bronchitis, chronic obstructive w acute bronchitis  Pulse ox 93% and in no acute distress. COPD exacerbation, more likely viral component, and  given this instance I think that we need to treat the patient with Zithromax additionally. Given that the patient is 41 and her pulmonary exam.  Reviewed red flags to the patient and her daughter  Follow-up: No Follow-up on file.  New Prescriptions   AZITHROMYCIN (ZITHROMAX) 250 MG TABLET    2 tabs po on day 1, then 1 tab po for 4 days   PREDNISONE (DELTASONE) 20 MG TABLET    2 tabs po for 5 days, then 1 tab for 5 days   No orders of the defined types were placed in this encounter.    Signed,  Maud Deed. Judia Arnott, MD   Patient's Medications  New Prescriptions   AZITHROMYCIN (ZITHROMAX) 250 MG TABLET    2 tabs po on day 1, then 1 tab po for 4 days   PREDNISONE (DELTASONE) 20 MG TABLET    2 tabs po for 5 days, then 1 tab for 5 days  Previous Medications   ACETAMINOPHEN (TYLENOL) 500 MG TABLET    Take 500 mg by mouth every 6 (six) hours as needed.     ALBUTEROL (PROAIR HFA) 108 (90 BASE) MCG/ACT INHALER    Inhale 2 puffs into the lungs every 6 (six) hours as needed for wheezing or shortness of breath.   AMLODIPINE (NORVASC) 5 MG TABLET    Take 1 tablet by mouth two  times daily   ASPIRIN 81 MG EC TABLET    Take 81  mg by mouth daily.     FERROUS FUMARATE (HEMOCYTE - 106 MG FE) 325 (106 FE) MG TABS TABLET    Take 1 tablet (106 mg of iron total) by mouth daily.   FLUTICASONE (FLONASE) 50 MCG/ACT NASAL SPRAY    Place 2 sprays into both nostrils daily.   FLUTICASONE-SALMETEROL (ADVAIR DISKUS) 250-50 MCG/DOSE AEPB    Inhale 1 puff into the lungs 2 (two) times daily.   FUROSEMIDE (LASIX) 20 MG TABLET    Take 1 tablet (20 mg total) by mouth daily as needed.   GABAPENTIN (NEURONTIN) 600 MG TABLET    Take 1 tablet (600 mg total) by mouth at bedtime.   HYDRALAZINE (APRESOLINE) 100 MG TABLET    Take 1 tablet (100 mg total) by mouth 3 (three) times daily.   NEBIVOLOL (BYSTOLIC) 10 MG TABLET    Take 1 tablet (10 mg total) by mouth daily.   OMEPRAZOLE (PRILOSEC) 40 MG CAPSULE    Take 1 capsule (40 mg  total) by mouth daily.   OXYBUTYNIN (DITROPAN) 5 MG TABLET    Take 1 tablet (5 mg total) by mouth 2 (two) times daily.   PROBIOTIC PRODUCT (PROBIOTIC DAILY PO)    Take 1 capsule by mouth daily.   TIOTROPIUM (SPIRIVA) 18 MCG INHALATION CAPSULE    Place 1 capsule (18 mcg total) into inhaler and inhale daily.   VITAMIN E 1000 UNIT CAPSULE    Take 1,000 Units by mouth daily.    Modified Medications   No medications on file  Discontinued Medications   No medications on file

## 2014-10-04 NOTE — Progress Notes (Signed)
Pre visit review using our clinic review tool, if applicable. No additional management support is needed unless otherwise documented below in the visit note. 

## 2014-10-11 ENCOUNTER — Other Ambulatory Visit: Payer: Medicare Other

## 2014-10-12 NOTE — Telephone Encounter (Signed)
ERROR

## 2014-10-14 ENCOUNTER — Encounter: Payer: Self-pay | Admitting: Family Medicine

## 2014-10-14 ENCOUNTER — Ambulatory Visit (INDEPENDENT_AMBULATORY_CARE_PROVIDER_SITE_OTHER): Payer: Medicare Other | Admitting: Family Medicine

## 2014-10-14 VITALS — BP 148/72 | HR 66 | Temp 97.9°F | Wt 128.5 lb

## 2014-10-14 DIAGNOSIS — I10 Essential (primary) hypertension: Secondary | ICD-10-CM

## 2014-10-14 DIAGNOSIS — J441 Chronic obstructive pulmonary disease with (acute) exacerbation: Secondary | ICD-10-CM

## 2014-10-14 NOTE — Progress Notes (Signed)
   Subjective:    Patient ID: Elizabeth Silva, female    DOB: 20-Nov-1917, 80 y.o.   MRN: [id number]  HPI  80 year old female pt presents for follow up on COPD exac on 12/21.  Saw Dr. Loletha Grayer, started on azithro and prednisone. Almost back to baseline SOB. 80% better.  She reports she is improved. Still some cough, no further fever. Occ wheeze. Good appetite. Resting a lot. She has not needed albuterol in past few days.  Continue advair and spiriva everyday.  Hx of HTN, CKD, COPD, PVD, high chol presents for 3 month follow up.   Hypertension: moderately controlled on hydralazine, amlodipine,bystolic, lasix as needed.  BP Readings from Last 3 Encounters:  10/14/14 148/72  10/04/14 144/72  07/20/14 165/61  Using medication without problems or lightheadedness: occ if bending over  Chest pain with exertion:None  Edema:Occ Short of breath: None Average home BPs: not checking  She saw Dr. Sabra Heck for B OA knee pain.  Tired knee braces but this hurt knee more.  Tried voltaren gel. Helped minimally. Considering shots steroids in knee. She is tolerating pain at this time.  Review of Systems  Constitutional: Negative for fever and fatigue.  HENT: Negative for ear pain.  Eyes: Negative for pain.  Respiratory: Negative for chest tightness and shortness of breath.  Cardiovascular: Negative for chest pain, palpitations and leg swelling.  Gastrointestinal: Negative for abdominal pain.  Genitourinary: Negative for dysuria.  Objective:   Physical Exam  Constitutional: Vital signs are normal. She appears well-developed and well-nourished. She is cooperative. Non-toxic appearance. She does not appear ill. No distress.  Elderly female in NAD  HENT:  Head: Normocephalic.  Right Ear: Hearing, tympanic membrane, external ear and ear canal normal. Tympanic membrane is not erythematous, not retracted and not bulging.  Left Ear: Hearing, tympanic membrane, external ear and ear canal  normal. Tympanic membrane is not erythematous, not retracted and not bulging.  Nose: No mucosal edema or rhinorrhea. Right sinus exhibits no maxillary sinus tenderness and no frontal sinus tenderness. Left sinus exhibits no maxillary sinus tenderness and no frontal sinus tenderness.  Mouth/Throat: Uvula is midline, oropharynx is clear and moist and mucous membranes are normal.  Eyes: Conjunctivae, EOM and lids are normal. Pupils are equal, round, and reactive to light. Lids are everted and swept, no foreign bodies found.  Neck: Trachea normal and normal range of motion. Neck supple. Carotid bruit is not present. No mass and no thyromegaly present.  Cardiovascular: Normal rate, regular rhythm, S1 normal, S2 normal, normal heart sounds, intact distal pulses and normal pulses. Exam reveals no gallop and no friction rub.  No murmur heard.  Pulmonary/Chest: Effort normal and breath sounds normal. Not tachypneic. No respiratory distress. She has no decreased breath sounds. She has no wheezes. She has no rhonchi. She has no rales.  Abdominal: Soft. Normal appearance and bowel sounds are normal. There is no tenderness.  Neurological: She is alert.  Skin: Skin is warm, dry and intact. No rash noted.  Psychiatric: Her speech is normal and behavior is normal. Judgment and thought content normal. Her mood appears not anxious. Cognition and memory are normal. She does not exhibit a depressed mood.      Review of Systems     Objective:   Physical Exam        Assessment & Plan:

## 2014-10-14 NOTE — Progress Notes (Signed)
Pre visit review using our clinic review tool, if applicable. No additional management support is needed unless otherwise documented below in the visit note. 

## 2014-10-14 NOTE — Assessment & Plan Note (Signed)
Well controlled. Continue current medication.  

## 2014-10-14 NOTE — Assessment & Plan Note (Signed)
Resolving. Continue albuterol prn, advair and spiriva daily.

## 2014-10-14 NOTE — Patient Instructions (Signed)
Schedule medicare wellness with labs prior in 01/2015.  Expect cough to gradually improve. Call if not continuing to improve or new fever, increae SOB.

## 2014-10-19 ENCOUNTER — Ambulatory Visit: Payer: Medicare Other | Admitting: Family Medicine

## 2014-10-22 ENCOUNTER — Ambulatory Visit: Payer: Medicare Other | Admitting: Family Medicine

## 2014-10-26 ENCOUNTER — Ambulatory Visit (INDEPENDENT_AMBULATORY_CARE_PROVIDER_SITE_OTHER): Payer: Medicare Other | Admitting: Cardiovascular Disease

## 2014-10-26 ENCOUNTER — Encounter: Payer: Self-pay | Admitting: Cardiovascular Disease

## 2014-10-26 ENCOUNTER — Ambulatory Visit: Payer: Medicare Other | Admitting: Family Medicine

## 2014-10-26 VITALS — BP 130/42 | HR 66 | Ht 61.0 in | Wt 128.5 lb

## 2014-10-26 DIAGNOSIS — R079 Chest pain, unspecified: Secondary | ICD-10-CM

## 2014-10-26 DIAGNOSIS — H543 Unqualified visual loss, both eyes: Secondary | ICD-10-CM | POA: Diagnosis not present

## 2014-10-26 DIAGNOSIS — I5032 Chronic diastolic (congestive) heart failure: Secondary | ICD-10-CM

## 2014-10-26 DIAGNOSIS — I10 Essential (primary) hypertension: Secondary | ICD-10-CM

## 2014-10-26 DIAGNOSIS — R010 Benign and innocent cardiac murmurs: Secondary | ICD-10-CM

## 2014-10-26 DIAGNOSIS — R011 Cardiac murmur, unspecified: Secondary | ICD-10-CM

## 2014-10-26 DIAGNOSIS — E7800 Pure hypercholesterolemia, unspecified: Secondary | ICD-10-CM

## 2014-10-26 DIAGNOSIS — I6529 Occlusion and stenosis of unspecified carotid artery: Secondary | ICD-10-CM | POA: Insufficient documentation

## 2014-10-26 DIAGNOSIS — I6522 Occlusion and stenosis of left carotid artery: Secondary | ICD-10-CM

## 2014-10-26 MED ORDER — LOVASTATIN 20 MG PO TABS: 20.0000 mg | ORAL_TABLET | Freq: Every day | ORAL | Status: DC

## 2014-10-26 NOTE — Assessment & Plan Note (Signed)
She is not taking Lasix. Relatively euvolemic on today's visit

## 2014-10-26 NOTE — Assessment & Plan Note (Signed)
Blood pressure on recheck 140/44. No medication changes made. She is concerned about her systolic pressure though mean arterial pressure is adequate. I'm concerned about her low diastolic pressure.

## 2014-10-26 NOTE — Assessment & Plan Note (Addendum)
Previously noted to have 40% carotid stenosis on the left. She would like to treat this aggressively. Lovastatin started

## 2014-10-26 NOTE — Progress Notes (Signed)
Patient ID: Elizabeth Silva, female    DOB: 1917-11-10, 81 y.o.   MRN: [id number]  HPI Comments: 81 yo with history of malignant HTN, COPD, hyperlipidemia, left bundle-branch block, moderate tricuspid valve regurgitation since 2011 with normal ejection fraction in 2012, presents today for routine followup of her hypertension.  She reports that overall she is feeling well. She is very concerned about vision loss in her left eye. 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 Ophthalmologist feels her vision loss is secondary to atherosclerotic disease and hypertension. She reports her blood pressure at home is typically 140/50 She denies any significant shortness of breath, no leg edema. In the past she did not want a statin. She no longer takes red yeast rice She does report having myalgias on simvastatin in the past She is very interested in doing everything she can for her vision  She Continues to live independently. No recent falls.   She lives on a split level, uses the stairs frequently. Chronic fatigue/poor energy poor several years. She's not using her cane. Does not go out of the house much, once a week maybe goes to [employer]  EKG on today's visit shows normal rhythm with rate 66 bpm, left bundle branch block well with   Other past medical history Previous hospital admission for sepsis, recurrent pneumonia, diagnosed with C. Difficile.  treated with Flagyl and diarrhea improved. Notes from the hospital indicate Klebsiella urinary tract infection treated with antibiotics, C. difficile improving on Flagyl, bradycardia, recurrent pneumonia that was improving on antibiotics, sepsis that was treated and improved, chronic kidney disease.    Previous  Echo was suggestive of diastolic dysfunction with normal EF (60-65%). Moderate TR         Allergies  Allergen Reactions  . Levofloxacin     REACTION: Burning in legs  . Penicillins     REACTION: Rash    Outpatient Encounter  Prescriptions as of 10/26/2014  Medication Sig  . acetaminophen (TYLENOL) 500 MG tablet Take 500 mg by mouth every 6 (six) hours as needed.    Marland Kitchen albuterol (PROAIR HFA) 108 (90 BASE) MCG/ACT inhaler Inhale 2 puffs into the lungs every 6 (six) hours as needed for wheezing or shortness of breath.  Marland Kitchen amLODipine (NORVASC) 5 MG tablet Take 1 tablet by mouth two  times daily  . aspirin 81 MG EC tablet Take 81 mg by mouth daily.    . ferrous fumarate (HEMOCYTE - 106 MG FE) 325 (106 FE) MG TABS tablet Take 1 tablet (106 mg of iron total) by mouth daily.  . fluticasone (FLONASE) 50 MCG/ACT nasal spray Place 2 sprays into both nostrils daily.  . Fluticasone-Salmeterol (ADVAIR DISKUS) 250-50 MCG/DOSE AEPB Inhale 1 puff into the lungs 2 (two) times daily.  . furosemide (LASIX) 20 MG tablet Take 1 tablet (20 mg total) by mouth daily as needed.  . gabapentin (NEURONTIN) 600 MG tablet Take 1 tablet (600 mg total) by mouth at bedtime.  . hydrALAZINE (APRESOLINE) 100 MG tablet Take 1 tablet (100 mg total) by mouth 3 (three) times daily.  . nebivolol (BYSTOLIC) 10 MG tablet Take 1 tablet (10 mg total) by mouth daily.  Marland Kitchen omeprazole (PRILOSEC) 40 MG capsule Take 1 capsule (40 mg total) by mouth daily.  Marland Kitchen oxybutynin (DITROPAN) 5 MG tablet Take 1 tablet (5 mg total) by mouth 2 (two) times daily.  . Probiotic Product (PROBIOTIC DAILY PO) Take 1 capsule by mouth daily.  Marland Kitchen tiotropium (SPIRIVA) 18 MCG inhalation capsule  Place 1 capsule (18 mcg total) into inhaler and inhale daily.  . vitamin E 1000 UNIT capsule Take 1,000 Units by mouth daily.    Marland Kitchen lovastatin (MEVACOR) 20 MG tablet Take 1 tablet (20 mg total) by mouth at bedtime.    Past Medical History  Diagnosis Date  . Allergic rhinitis   . COPD (chronic obstructive pulmonary disease)   . Diverticulosis of colon   . GERD (gastroesophageal reflux disease)   . Hyperlipidemia   . Hypertension     LE swelling with amlodipine, intolerant of ACEIs  . Osteoarthritis    . CKD (chronic kidney disease)   . Impaired vision     right eye, was told due to HTN  . LBBB (left bundle branch block)   . Diastolic CHF, acute     echo (4/11) with EF 60-65%,mild LVH, mild aortic insufficiency, mild MR, PA systolic pressure 62-94 mmHg, mild to moderate TR  . History of PFTs   . H/O Clostridium difficile infection Sept. 2013    Past Surgical History  Procedure Laterality Date  . Cardiac catheterization  1999    normal  . Total abdominal hysterectomy  1970    suspicious cells  . Cholecystectomy  1980  . Cervical disc surgery  2005    cerv. disc, dz. C5-C7, facet arthritis    Social History  reports that she quit smoking about 30 years ago. Her smoking use included Cigarettes. She smoked 1.00 pack per day. She has never used smokeless tobacco. She reports that she drinks alcohol. She reports that she does not use illicit drugs.  Family History family history includes Cancer in her brother; Colon cancer in her sister; Dementia in her father and sister; Diabetes in her brother; Heart block in her mother; Lung cancer in her brother.  Review of Systems  Constitutional: Positive for fatigue.  Eyes: Positive for visual disturbance.  Respiratory: Negative.   Cardiovascular: Negative.   Gastrointestinal: Negative.   Musculoskeletal: Positive for gait problem.  Neurological: Positive for weakness.  Hematological: Negative.   Psychiatric/Behavioral: Negative.   All other systems reviewed and are negative.  BP 130/42 mmHg  Pulse 66  Ht 5\' 1"  (1.549 m)  Wt 128 lb 8 oz (58.287 kg)  BMI 24.29 kg/m2  Physical Exam

## 2014-10-26 NOTE — Assessment & Plan Note (Signed)
We spent time with her today talking about blood pressure. No medication changes made. In terms of her cholesterol, she is willing to start a low-dose cholesterol medication. We will start lovastatin 20 mg daily

## 2014-10-26 NOTE — Assessment & Plan Note (Signed)
Lovastatin started at her request as above. Goal LDL less than 70

## 2014-10-26 NOTE — Patient Instructions (Signed)
You are doing well.  Pleas start lovastatin one a day for cholesterol  Please call us if you have new issues that need to be addressed before your next appt.  Your physician wants you to follow-up in: 6 months.  You will receive a reminder letter in the mail two months in advance. If you don't receive a letter, please call our office to schedule the follow-up appointment.

## 2014-11-13 ENCOUNTER — Other Ambulatory Visit: Payer: Self-pay | Admitting: Family Medicine

## 2014-11-15 ENCOUNTER — Other Ambulatory Visit: Payer: Self-pay | Admitting: *Deleted

## 2014-11-15 MED ORDER — FLUTICASONE-SALMETEROL 250-50 MCG/DOSE IN AEPB: 1.0000 | INHALATION_SPRAY | Freq: Two times a day (BID) | RESPIRATORY_TRACT | Status: DC

## 2014-11-15 MED ORDER — OMEPRAZOLE 40 MG PO CPDR: 40.0000 mg | DELAYED_RELEASE_CAPSULE | Freq: Every day | ORAL | Status: DC

## 2014-12-21 ENCOUNTER — Other Ambulatory Visit: Payer: Self-pay | Admitting: Family Medicine

## 2014-12-21 NOTE — Telephone Encounter (Signed)
Last office visit 10/14/2014.  Last refilled 10/27/2013 #90 with 3 refills.  Ok to refill?

## 2015-01-06 ENCOUNTER — Telehealth: Payer: Self-pay | Admitting: Family Medicine

## 2015-01-06 ENCOUNTER — Other Ambulatory Visit: Payer: Self-pay

## 2015-01-06 DIAGNOSIS — R7303 Prediabetes: Secondary | ICD-10-CM

## 2015-01-06 DIAGNOSIS — D631 Anemia in chronic kidney disease: Secondary | ICD-10-CM

## 2015-01-06 DIAGNOSIS — E7800 Pure hypercholesterolemia, unspecified: Secondary | ICD-10-CM

## 2015-01-06 DIAGNOSIS — N184 Chronic kidney disease, stage 4 (severe): Secondary | ICD-10-CM

## 2015-01-06 NOTE — Telephone Encounter (Signed)
-----   Message from Ellamae Sia sent at 12/31/2014  4:30 PM EDT ----- Regarding: Lab orders for Thursday, 3.24.16 Lab orders for a 3 month f/u

## 2015-01-06 NOTE — Telephone Encounter (Signed)
This was supposed to be a medicare wellness appt. See my last phone note.  Please reschedule her as such so we have time.

## 2015-01-06 NOTE — Telephone Encounter (Signed)
I have ask Barbera Setters to work on this to a lot enough time for a [employer] visit and not a follow up.

## 2015-01-14 ENCOUNTER — Encounter: Payer: Self-pay | Admitting: Family Medicine

## 2015-01-14 ENCOUNTER — Ambulatory Visit (INDEPENDENT_AMBULATORY_CARE_PROVIDER_SITE_OTHER): Payer: Medicare Other | Admitting: Family Medicine

## 2015-01-14 VITALS — BP 138/58 | HR 68 | Temp 97.6°F | Wt 130.4 lb

## 2015-01-14 DIAGNOSIS — N184 Chronic kidney disease, stage 4 (severe): Secondary | ICD-10-CM

## 2015-01-14 DIAGNOSIS — D631 Anemia in chronic kidney disease: Secondary | ICD-10-CM

## 2015-01-14 DIAGNOSIS — I5032 Chronic diastolic (congestive) heart failure: Secondary | ICD-10-CM

## 2015-01-14 DIAGNOSIS — N3000 Acute cystitis without hematuria: Secondary | ICD-10-CM

## 2015-01-14 DIAGNOSIS — R300 Dysuria: Secondary | ICD-10-CM | POA: Diagnosis not present

## 2015-01-14 DIAGNOSIS — R7303 Prediabetes: Secondary | ICD-10-CM

## 2015-01-14 DIAGNOSIS — I10 Essential (primary) hypertension: Secondary | ICD-10-CM

## 2015-01-14 DIAGNOSIS — E7800 Pure hypercholesterolemia, unspecified: Secondary | ICD-10-CM

## 2015-01-14 DIAGNOSIS — R440 Auditory hallucinations: Secondary | ICD-10-CM

## 2015-01-14 DIAGNOSIS — R7309 Other abnormal glucose: Secondary | ICD-10-CM | POA: Diagnosis not present

## 2015-01-14 LAB — CBC WITH DIFFERENTIAL/PLATELET
Basophils Absolute: 0.1 10*3/uL (ref 0.0–0.1)
Basophils Relative: 0.8 % (ref 0.0–3.0)
EOS PCT: 2.8 % (ref 0.0–5.0)
Eosinophils Absolute: 0.2 10*3/uL (ref 0.0–0.7)
HEMATOCRIT: 34.5 % — AB (ref 36.0–46.0)
HEMOGLOBIN: 11.3 g/dL — AB (ref 12.0–15.0)
LYMPHS ABS: 1.8 10*3/uL (ref 0.7–4.0)
Lymphocytes Relative: 20.7 % (ref 12.0–46.0)
MCHC: 32.9 g/dL (ref 30.0–36.0)
MCV: 92.8 fl (ref 78.0–100.0)
MONO ABS: 0.8 10*3/uL (ref 0.1–1.0)
MONOS PCT: 8.7 % (ref 3.0–12.0)
NEUTROS ABS: 5.9 10*3/uL (ref 1.4–7.7)
Neutrophils Relative %: 67 % (ref 43.0–77.0)
Platelets: 225 10*3/uL (ref 150.0–400.0)
RBC: 3.71 Mil/uL — ABNORMAL LOW (ref 3.87–5.11)
RDW: 14.6 % (ref 11.5–15.5)
WBC: 8.8 10*3/uL (ref 4.0–10.5)

## 2015-01-14 LAB — LIPID PANEL
Cholesterol: 162 mg/dL (ref 0–200)
HDL: 59.8 mg/dL (ref >39.00)
LDL Cholesterol: 72 mg/dL (ref 0–99)
NonHDL: 102.2
TRIGLYCERIDES: 150 mg/dL — AB (ref 0.0–149.0)
Total CHOL/HDL Ratio: 3
VLDL: 30 mg/dL (ref 0.0–40.0)

## 2015-01-14 LAB — COMPREHENSIVE METABOLIC PANEL
ALT: 11 U/L (ref 0–35)
AST: 19 U/L (ref 0–37)
Albumin: 4.4 g/dL (ref 3.5–5.2)
Alkaline Phosphatase: 57 U/L (ref 39–117)
BILIRUBIN TOTAL: 0.3 mg/dL (ref 0.2–1.2)
BUN: 52 mg/dL — ABNORMAL HIGH (ref 6–23)
CO2: 23 meq/L (ref 19–32)
Calcium: 9.8 mg/dL (ref 8.4–10.5)
Chloride: 110 mEq/L (ref 96–112)
Creatinine, Ser: 1.67 mg/dL — ABNORMAL HIGH (ref 0.40–1.20)
GFR: 30.29 mL/min — AB (ref >60.00)
GLUCOSE: 153 mg/dL — AB (ref 70–99)
Potassium: 4.5 mEq/L (ref 3.5–5.1)
Sodium: 139 mEq/L (ref 135–145)
Total Protein: 7.4 g/dL (ref 6.0–8.3)

## 2015-01-14 LAB — POCT URINALYSIS DIPSTICK
Bilirubin, UA: NEGATIVE
Blood, UA: NEGATIVE
Glucose, UA: NEGATIVE
Ketones, UA: NEGATIVE
Nitrite, UA: POSITIVE
SPEC GRAV UA: 1.025
Urobilinogen, UA: >=8
pH, UA: 6

## 2015-01-14 LAB — HEMOGLOBIN A1C: Hgb A1c MFr Bld: 5.4 % (ref 4.6–6.5)

## 2015-01-14 LAB — VITAMIN D 25 HYDROXY (VIT D DEFICIENCY, FRACTURES): VITD: 33.64 ng/mL (ref 30.00–100.00)

## 2015-01-14 MED ORDER — SULFAMETHOXAZOLE-TRIMETHOPRIM 800-160 MG PO TABS: 1.0000 | ORAL_TABLET | Freq: Two times a day (BID) | ORAL | Status: DC

## 2015-01-14 NOTE — Assessment & Plan Note (Signed)
Well controlled. Continue current medication.  

## 2015-01-14 NOTE — Progress Notes (Signed)
Subjective:    Patient ID: Elizabeth Silva, female    DOB: 1917/11/18, 81 y.o.   MRN: [id number]  HPI   37 yea r old female pt  With history of HTN, high cholesterol, COPD, CKD diastolic HF, frequent past UTI presetns for 3 months follow  Up.   Hypertension: well contorlled on bystolic, amlodipine, lasix BP Readings from Last 3 Encounters:  10/26/14 130/42  10/14/14 148/72  10/04/14 144/72  Using medication without problems or lightheadedness:  Chest pain with exertion: None Edema worse lately, has not taken fluid pill lately Short of breath:stable Average home BPs: not lately. Other issues:  Wt Readings from Last 3 Encounters:  01/14/15 130 lb 6.4 oz (59.149 kg)  10/26/14 128 lb 8 oz (58.287 kg)  10/14/14 128 lb 8 oz (58.287 kg)    She has been tired chronically. She has been more nervous lately working on income tax. She has been having a few weeks of dysuria, no frequency or urgency.  Continued incontinence.  Hx of carotid stenosis and high cholesterol, pt treating aggressively with statin. Per Dr. Donivan Scull last note.  5 nights ago she felt pressure on left cheek, woke her from a sleep. Heard a voice that said "move over here" Mildly confused at that time. No weakness,n numbness. Laid back down and it resolved in few seconds. Not witnessed. No new headache. Dr. Rockey Situ added lovastatin in 10/2014. NO SE.   Niece has NOT noted her acting differently at that time.    Review of Systems  Constitutional: Positive for fatigue. Negative for fever.  HENT: Negative for ear pain.   Eyes: Negative for pain.  Respiratory: Positive for shortness of breath. Negative for chest tightness.        Stable  Cardiovascular: Positive for leg swelling. Negative for chest pain and palpitations.  Gastrointestinal: Negative for abdominal pain.  Genitourinary: Negative for dysuria.  Neurological: Negative for light-headedness.       Objective:   Physical Exam  Constitutional: She is  oriented to person, place, and time. Vital signs are normal. She appears well-developed and well-nourished. She is cooperative.  Non-toxic appearance. She does not appear ill. No distress.  Elderly female in NAD  HENT:  Head: Normocephalic.  Right Ear: Hearing, tympanic membrane, external ear and ear canal normal. Tympanic membrane is not erythematous, not retracted and not bulging.  Left Ear: Hearing, tympanic membrane, external ear and ear canal normal. Tympanic membrane is not erythematous, not retracted and not bulging.  Nose: No mucosal edema or rhinorrhea. Right sinus exhibits no maxillary sinus tenderness and no frontal sinus tenderness. Left sinus exhibits no maxillary sinus tenderness and no frontal sinus tenderness.  Mouth/Throat: Uvula is midline, oropharynx is clear and moist and mucous membranes are normal.  Eyes: Conjunctivae, EOM and lids are normal. Pupils are equal, round, and reactive to light. Lids are everted and swept, no foreign bodies found.  Neck: Trachea normal and normal range of motion. Neck supple. Carotid bruit is not present. No thyroid mass and no thyromegaly present.  Cardiovascular: Normal rate, regular rhythm, S1 normal, S2 normal, normal heart sounds, intact distal pulses and normal pulses.  Exam reveals no gallop and no friction rub.   No murmur heard. Pulmonary/Chest: Effort normal and breath sounds normal. No tachypnea. No respiratory distress. She has no decreased breath sounds. She has no wheezes. She has no rhonchi. She has no rales.  Abdominal: Soft. Normal appearance and bowel sounds are normal. There is no hepatosplenomegaly. There  is no tenderness. There is no CVA tenderness. No hernia.  Neurological: She is alert and oriented to person, place, and time. She has normal strength and normal reflexes. No cranial nerve deficit or sensory deficit. She exhibits normal muscle tone. She displays a negative Romberg sign. Coordination and gait normal. GCS eye subscore  is 4. GCS verbal subscore is 5. GCS motor subscore is 6.  Nml cerebellar exam   No papilledema  Skin: Skin is warm, dry and intact. No rash noted.  Psychiatric: She has a normal mood and affect. Her speech is normal and behavior is normal. Judgment and thought content normal. Her mood appears not anxious. Cognition and memory are normal. Cognition and memory are not impaired. She does not exhibit a depressed mood. She exhibits normal recent memory and normal remote memory.          Assessment & Plan:

## 2015-01-14 NOTE — Assessment & Plan Note (Addendum)
One episode.  ? Dreaming vs secondary to UTI in elderly causing mental status changes vs due to anxiety.  No clear sign of neuro change on exam today.  Pt without signs of dementia per niece.  Will eval with labs, treat UTI, if continuing will consider further eval.

## 2015-01-14 NOTE — Assessment & Plan Note (Signed)
Abnormal UA, given typical symptoms, will start on antibiotics sulfa x 3 days. Send for culture to verify.

## 2015-01-14 NOTE — Assessment & Plan Note (Signed)
Due for re-eval now on lovastatin per Cards given carotid stenosis.

## 2015-01-14 NOTE — Assessment & Plan Note (Signed)
Mild volume overload.. Use lasix prn, take today.

## 2015-01-14 NOTE — Patient Instructions (Addendum)
Can use 20 mg lasix x 1 today, may repeat daily for a few days for peripheral swelling in ankles. Complete antibiotics course x 3 days. Call with culture and labs. Stop at lab on way out. Work on stress reduction and relaxation. Call if new neurologic changes .. like confusion, numbness weakness, hallucinations or worsening anxiety.

## 2015-01-14 NOTE — Addendum Note (Signed)
Addended by: Ellamae Sia on: 01/14/2015 03:11 PM   Modules accepted: Orders

## 2015-01-14 NOTE — Addendum Note (Signed)
Addended by: Amado Coe on: 01/14/2015 03:07 PM   Modules accepted: Orders

## 2015-01-14 NOTE — Assessment & Plan Note (Signed)
Due for re-eval. 

## 2015-01-14 NOTE — Progress Notes (Signed)
Pre visit review using our clinic review tool, if applicable. No additional management support is needed unless otherwise documented below in the visit note. 

## 2015-01-17 ENCOUNTER — Encounter: Payer: Self-pay | Admitting: *Deleted

## 2015-01-17 LAB — URINE CULTURE: Colony Count: >=100000

## 2015-02-01 NOTE — Consult Note (Signed)
PATIENT NAME:  Elizabeth Silva, Elizabeth Silva MR#:  [id number] DATE OF BIRTH:  1918/08/30  DATE OF CONSULTATION:  06/15/2012  REFERRING PHYSICIAN:   CONSULTING PHYSICIAN:  Allyne Gee, MD  REASON FOR CONSULTATION: Pneumonia versus lung mass.   HISTORY OF PRESENT ILLNESS: This is a 81 year old white female who is being seen by Dr. Oliva Bustard in work-up for possibility of lung mass. She was seen last in the cancer clinic on 05/14/2012. The patient is a patient of [facility]. She had come in for a lung nodule, to the cancer clinic, and had been treated recently for pneumonia. She had a PET scan done by Dr. Oliva Bustard and the PET scan was more or less showing uptake in the area of consolidation, in the right lung, in the middle lobe, and it was not really high uptake. There was mild uptake in the mediastinal lymph nodes. It is felt that she had possible pulmonary infection with slow resolution. However, there was a concern for whether or not this could be bronchoalveolar cell carcinoma. She presented this time to the hospital with increasing shortness of breath, a little bit of cough, and some weakness, and she is really not having any fevers or chills. When she came into the hospital, she was also noted to have a white count of 21.2.   PAST MEDICAL HISTORY:  1. As noted above for the ongoing pulmonary infiltrate.  2. Hyperlipidemia. 3. Chronic obstructive pulmonary disease. 4. Diverticulitis.  5. Hypertension.  6. Arthritis.   PAST SURGICAL HISTORY:  1. Hysterectomy. 2. Rhinoplasty.  3. Cholecystectomy.  4. Oophorectomy.   ALLERGIES: Penicillin, Levaquin, and potassium.   FAMILY HISTORY: Noncontributory.   SOCIAL HISTORY: Currently does not smoke or drink.  REVIEW OF SYSTEMS: A complete twelve-point review of system was done and is unremarkable, other than what is noted above in the history of present illness.   PHYSICAL EXAMINATION:   VITAL SIGNS: Temperature 98.9, pulse 84, respiratory rate 18, blood  pressure 132/54, and saturation 93%.  NECK: Neck appears to be supple. There is no JVD, no adenopathy, and no thyromegaly.   LUNGS: Few coarse breath sounds. No rhonchi or rales. Expansion was equal.   HEART: S1 and S2 is normal. Regular rhythm. No gallop or rub.   ABDOMEN: Soft and nontender.   EXTREMITIES: No cyanosis or clubbing. Pulses equal.   NEUROLOGIC: She was awake and alert and was moving all four extremities.   LABORATORY AND RADIOLOGIC DATA: Current white count is already mentioned above. Chemistries show a BUN of 44, creatinine 1.95, sodium 138, and potassium 4.1. Bilirubin and liver enzymes are basically fine. Troponin was negative.   CT and chest x-ray results: The chest x-ray results show possibility of a right upper lobe pneumonia with some areas of atelectasis.   IMPRESSION: Pneumonia, probable slow resolve with possible underlying lung nodule or malignancy.   PLAN: The patient is already being followed by oncology. At this stage, I would continue with antibiotics and repeat chest x-ray or CT scan, as suggested, and I think Dr. Oliva Bustard, according to what the patient's family is telling me, stated that they have a plan to repeat the CT so it may be     possible to do it while she is here in the hospital at this time.  Dr. Raul Del will be back on Monday and will take over care of the patient. I will make further recommendations as deemed necessary. Her prognosis overall remains guarded. ____________________________ Allyne Gee, MD sak:slb D: 06/15/2012  12:37:37 ET T: 06/15/2012 16:02:17 ET JOB#: [id number]  cc: Allyne Gee, MD, <Dictator> Allyne Gee MD ELECTRONICALLY SIGNED 07/21/2012 18:36

## 2015-02-01 NOTE — H&P (Signed)
PATIENT NAME:  Elizabeth Silva, [place] MR#:  [id number] DATE OF BIRTH:  09-Feb-1918  DATE OF ADMISSION:  06/15/2012  REFERRING PHYSICIAN: Lurline Hare, MD  PRIMARY CARE PHYSICIAN: Eliezer Lofts, MD  CHIEF COMPLAINT: Productive cough, weakness, and fever.   HISTORY OF PRESENT ILLNESS: This is a 81 year old female with significant past medical history of osteoarthritis, diverticulosis, chronic obstructive pulmonary disease, hyperlipidemia, hypertension, and C. difficile colitis who presents with the complaint of weakness and fever. The patient reports she had a fever at home at 100.5 as well as complains of progressive weakness, decreased oral intake and appetite, and productive sputum with cough. The patient took Tylenol at home so upon presentation to the ED she was already afebrile. She had leukocytosis of 21,000. Chest x-ray done did show right lung infiltrate. As well the patient has been complaining of irregular bowel movement, frequent but not watery, so she had a CT of the abdomen and pelvis done which only showed significant diverticulosis but no other abnormality. The patient has already been following with Dr. Raul Del as an outpatient from [facility] for evaluation for her recurrent pneumonia as she was recently treated in July of this year with Avelox course for her pneumonia without much improvement of the imaging where she did follow with outpatient oncology, Dr. Oliva Bustard, who PET scan done for her in July of this year with recommendation to repeat CT of the chest to follow resolution of the infiltrate, which is supposed to be scheduled in two weeks. The patient reports she has not been feeling well over the last week. The patient had blood cultures sent in the ED and started on Rocephin and Zithromax. The patient's blood work showed a creatinine of 1.95 and was reviewed with [facility] labs done last year when she was having chronic kidney disease with creatinine baseline around 2.   PAST  MEDICAL HISTORY:  1. Cervical disk disease.  2. Osteoarthritis.  3. Diverticulosis.  4. Chronic obstructive pulmonary disease.  5. Hyperlipidemia.  6. History of Clostridium difficile colitis.  7. Chronic kidney disease.  8. Hypertension.   PAST SURGICAL HISTORY:  1. Cholecystectomy.  2. Total abdominal hysterectomy and bilateral salpingo-oophorectomy.   ALLERGIES: Penicillin, levofloxacin, and potassium.   SOCIAL HISTORY: The patient does not smoke or drink alcohol.   FAMILY HISTORY: Significant for hypertension, diabetes, and heart disease.   HOME MEDICATIONS:  1. Vitamin E 1000 international units oral daily.  2. Tylenol extra-strength 1 tablet daily as needed.  3. Spiriva 18 mcg daily.  4. Proventil 2 puffs every four hours as needed.  5. Prilosec 40 mg daily.  6. Norvasc 5 mg daily.  7. Lasix 20 mg daily.  8. Hydralazine 50 mg three times daily. 9. Gabapentin 600 mg daily.  10. Fesoterodine 8 mg oral daily. 11. Bystolic 10 mg daily. 12. Aspirin 81 mg daily.   REVIEW OF SYSTEMS: CONSTITUTIONAL: The patient has complaints of fever of 100.5 at home. Complains of progressive fatigue and weakness. EYES: Denies blurry vision, double vision, or eye pain. ENT: Denies tinnitus, ear pain, hearing loss, or epistaxis. RESPIRATORY: Complains of cough, productive sputum, and has chronic obstructive pulmonary disease and progressive dyspnea. Denies any hemoptysis. CARDIOVASCULAR: Denies any chest pain, edema, arrhythmia, palpitations, or syncope. GASTROINTESTINAL: Complains of nausea. Denies any vomiting. Had frequent bowel movements but not watery. Denies any constipation, abdominal pain, hematemesis, or rectal bleed. GU: Denies dysuria, hematuria, or renal colic. ENDOCRINE: Denies polyuria, polydipsia, or heat or cold intolerance. HEMATOLOGY: Denies anemia,  easy bruising, or bleeding diathesis. INTEGUMENTARY: Denies any acne, rash, or itching. MUSCULOSKELETAL: Denies any neck pain, shoulder  pain, or knee pain. Has arthritis and lower back pain. NEURO: Denies any numbness, dysarthria, tremors, vertigo, or ataxia. PSYCHIATRIC: Denies anxiety, insomnia, bipolar disorder, schizophrenia, alcohol or substance abuse.   PHYSICAL EXAMINATION:   VITAL SIGNS: Temperature 98.5, pulse 76, respiratory rate 20, blood pressure 145/65, and saturating 92% on room air.   GENERAL: Frail, elderly female who looks comfortable in bed, in no apparent distress.   HEENT: Head atraumatic, normocephalic. Pupils are equal and reactive to light. Pink conjunctivae. Anicteric sclerae. Dry oral mucosa.   NECK: Supple. No thyromegaly. No JVD.   LUNGS:  Had fair air entry bilaterally with some rales in right lung field. No wheezing.   HEART: S1 and S2 heard. No rubs, murmurs, or gallops.   ABDOMEN: Soft, nontender, and nondistended. Bowel sounds present.   EXTREMITIES: No edema. No clubbing. No cyanosis.   PSYCHIATRIC: Appropriate affect. Awake and alert x3. Intact judgment and insight.   NEUROLOGIC: Cranial nerves grossly intact. Motor 5 out of 5. Sensation symmetrical. Reflexes symmetrical.   SKIN: Dry skin. Dry oral mucosa. Chronic lower extremity discoloration.  PERTINENT LABORATORY, DIAGNOSTIC AND RADIOLOGIC DATA: Glucose 120, BUN 44, creatinine 1.95, sodium 138, potassium 4.1, chloride 102, and CO2 25. White blood cells 21.2, hemoglobin 11.8, hematocrit 34.9, and platelets 236.   Urinalysis is showing trace leukocytes esterase and 11 white blood cells.   CT of the abdomen and pelvis without contrast is showing mild infiltrate in the right lower lobe which could represent an early pneumonia. Blader wall thickening with mild surrounding inflammation. Possible cystitis. Diverticulosis. Sigmoid colon without evidence of diverticulitis. Small nodule in the right middle lobe and in the subpleural right lower lobe, possibly inflammatory in nature, but indeterminant technically and mild infiltrate in the  right lower lobe which could represent an early pneumonia.   ASSESSMENT AND PLAN:  1. Sepsis. The patient had a fever of 100.5 at home and leukocytosis at 21,000. This is most likely related to pneumonia.  2. Recurrent pneumonia. The patient was treated as an outpatient with Avelox course last month with recurring of pneumonia again. The patient has been actively worked up for her recurrent pneumonia by Dr. Raul Del and Dr. Oliva Bustard from pulmonary and oncology service, for which she had PET scan done and scheduled to have CT done in two weeks. We will reconsult Dr. Raul Del to see  if any further imaging is needed at this point. The patient's CT of abdomen and pelvis did show some infiltrates in the right middle lobe of the lung and small nodule subpleural as well. The patient will be started on Rocephin and Zithromax. Blood culture and sputum culture will be sent. She will also be started on Mucinex and albuterol p.r.n.  3. Chronic obstructive pulmonary disease. Does not appear to be in exacerbation. We will continue the patient on albuterol as needed and Spiriva.  4. Leukocytosis. This is most likely related to pneumonia, but given the fact the patient has history of C. difficile and irregular bowel movements with recent antibiotics we will check C. difficile by PCR.  5. Chronic kidney disease. Appears to be at baseline. We will continue with fluids.  6. Hypertension. The patient appears to be clinically dehydrated. We will hold her Lasix and we will hold hydrazine. Once more stable can be resumed. Meanwhile we will continue her only on Bystolic.  7. Urinary tract infection. The patient  had positive urinalysis, already on Rocephin.  8. Deep vein thrombosis prophylaxis. Subcutaneous heparin.  9. GI prophylaxis. Protonix.  10. Code status. The patient is FULL CODE.   TOTAL TIME SPENT ON ADMISSION AND PATIENT CARE: 50 minutes.  ____________________________ Albertine Patricia, MD dse:slb D: 06/15/2012  03:59:27 ET     T: 06/15/2012 12:24:34 ET        JOB#: [id number] cc: Albertine Patricia, MD, <Dictator> Jinny Sanders, MD [facility] [place] Graciela Husbands MD ELECTRONICALLY SIGNED 06/16/2012 1:09

## 2015-02-01 NOTE — Consult Note (Signed)
Note Type Consult   HPI: Referred by Dr. Raul Del, PCP is Dr. Amy Bedsole([id number])  This 81 year old Female patient presents to the clinic for initial evaluation of (1)for lung nodule(1)  Chief Complaint:   [place] Patient (1)    Presenting Problem patient is here today for initial evaluation regarding lung nodule. states feeling well. offers no complaints.(1)    Negative Symptoms fever, chills, anorexia, weakness, nausea, vomiting, diarrhea, constipation, cough, shortness of breath, palpitations, burning with urination, urinary frequency, headache, numbness/tingling, back pain, hip pain, rash(1)    Other Symptoms patient reports coughing up mucous but not blood, appetite is good, patient states that her weight fluctuates, patient hurts in her joints frm arthritis, right leg swells at times,(1)   Subjective:  Chief Complaint/Diagnosis:   1.patient had abnormal CT scan April 23, 2012.  Abnormal soft tissue mass in the right middle lobe anteriorly.  Followed by PET scan because of the low SUV    HPI:   81 year old lady started having low-grade fever and cough in May of 2013.  Patient had a chest x-ray which was a normal so CT scan was done in June as described above revealed right middle lobe infiltration.  With questionable enlarged lymphadenopathy in mediastinal.  Patient was referred to a pulmonologist.  Patient had a PET scan done in here to discuss the results and further followup.  According to her she is feeling somewhat better.  No chest pain.  No hemoptysis.  Appetite has been stable.  Has not lost significant amount of weight.   Review of Systems:   Performance Status (ECOG): 1   Psych: no complaints   Pain ?: No complaints (0, none)   Review of Systems: All other systems were reviewed and found to be negative   Review of Systems:   patient does have some arthritis pain  Allergies:  PCN: Unknown  Potassium Chloride: Unknown  Significant History/PMH:   Renal Insufficiency:     Hyperlipidemia:    COPD:    Diverticulitis:    Cervical disc disease:    Arthritis:    HTN:    Oopherectomy:    Cholecystectomy:    Rhinoplasty:    Hysterectomy - Total:   Preventive Screening:   Has patient had any of the following test? Mammography (1)    Last Mammography: 2012(1)   Smoking History: Smoking History quit in 1960's(1)  PFSH:  Comments: Family history of colon cancer and lung cancer.  Hypertensin   Social History: negative alcohol   Comments: Has smoked for 30 years prior to quitting.  Patient quit smoking 30 years ago.   Additional Past Medical and Surgical History: As mentioned above   Home Medications: Medication Instructions Last Modified Date/Time  vitamin E 1000 intl units oral capsule 1 cap(s) orally once a day 31-Jul-13 10:25  aspirin 81 mg oral tablet 1 tab(s) orally once a day 31-Jul-13 10:25  meloxicam 15 mg oral tablet 1 tab(s) orally once a day 31-Jul-13 10:25  Tylenol Extra Strength PM 500 mg-25 mg oral tablet 1 tab(s) orally once a day (at bedtime) 73-UKG-25 42:70  Bystolic 10 mg oral tablet 1 tab(s) orally once a day 31-Jul-13 10:25  gabapentin 600 mg oral tablet 1 tab(s) orally once a day (at bedtime) 31-Jul-13 10:25  Enablex 15 mg oral tablet, extended release 1 tab(s) orally once a day 31-Jul-13 10:25  hydrALAZINE 50 mg oral tablet 1 tab(s) orally 3 times a day 31-Jul-13 10:25  Proventil HFA 2 puff(s) orally  every 4 hours 31-Jul-13 10:25  Mucinex 600 mg oral tablet, extended release 1 tab(s) orally every 12 hours 31-Jul-13 10:25  Norvasc 5 mg oral tablet 1 tab(s) orally once a day 31-Jul-13 10:25  Spiriva 18 mcg inhalation capsule 1 each inhaled once a day 31-Jul-13 10:25  Allegra 24 Hour Allergy oral tablet 1 tab(s) orally once a day 31-Jul-13 10:25  fluticasone 50 mcg/inh nasal spray 2 spray(s) nasal once a day (at bedtime) 31-Jul-13 10:25  clarithromycin 500 mg oral tablet 1 tab(s) orally every 12 hours 31-Jul-13 10:25    Vital Signs:   :: Ht(CM): 155 Wt(KG): 60.8 BSA: 1.5 Temp: 97.8 Pulse: 63 RR: 18  BP: 161/63   Physical Exam:   General: Even  at age 27 patient is   very  functional.  Used to play golf up to age 6   Mental Status: alert and oriented to person, place and time   Head, Ears, Nose,Throat: normal, no lesions or deformities   Neck, Thyroid: no thyroid tenderness, enlargement or nodule.  neck supple without massess or tenderness. no adenopathy.   Respiratory: Lungs: Scattered rhonchi.  No crepitations.  Air  entry diminish on both sides   Cardiovascular: regular rate and rhythm, no murmur, rub or gallop   Gastrointestinal: soft, non tender, no masses and normal bowel sounds   Musculoskeletal: no muscular asymmetry noted.  no swelling or tenderness of joints.  ROM upper and lower extremities normal   Skin: no rashes, ulcers, or lesions   Neurological: normal motor exam, gait, 5 x 5, strength, grip, pressure, flexor, extensor   Lymphatics: no cervical, axillary, or inguinal lymphadenopathy   Lab Results Review:   Lab Results     Review Pathology Report:  Pathology Reports:     Medical Imaging Results:    Review Medical Imaging   Chest Without Contrast 23-Apr-2012 13:45:00: IMPRESSION:   1. There is an abnormal soft tissue mass in the right middle lobe   anteriorly. This coupled with borderline enlarged mediastinal and hilar   lymph nodes is worrisome for malignancy. There is also a small right   pleural effusion layering posteriorly. Followup PET CT scanning is   recommended.  2. There are findings which likely reflect underlying COPD. Two areas of   patchy nodular density are seen in the left lung but are nonspecific.          Verified By: DAVID A. Martinique, M.D., MD Scan Lung Cancer Diagnosis 07-May-2012 13:42:00: IMPRESSION:    1. Uptake in the area of consolidation in the right lung inthe middle   lobe is similar to that of the mediastinum. Atelectasis or resolving   infiltrate could be  considered. Mild uptake within mediastinal lymph   nodes is also nonspecific. Patchy area of slightly increased localization   in the right lung near the apex does not definitely correlate with   significant soft tissue density. There is some mild pleural thickening in   density at the apex. This could represent an area of misregistration or   could represent some mild pneumonitis.  2. Diffusely increased uptake within the liver and multiple loops of   bowel is nonspecific. There is some muscular uptake along the chest wall   and in the area the hips. This is diffuse and nonfocal. 3. Degenerative   changes are noted in the spine.      Thankyou for the opportunity to contribute to the care of your patient.   Dictation Site: 1  Verified By: Sundra Aland, M.D., MD  Assessment and Plan:  Impression:   1.abnormal CT scan in this  81 year old lady scan has been reviewed independently along with the PET scan.  And also the case was discussed in tumor conference.  I had prolonged discussion with family (Face to face encounter with patient and family for 45 minutes answering all their questions and concerns regarding disease process and options of treatment) clinical picture is consistent with pulmonary infection which is gradually resolvingof malignancy like bronchoalveolar cancer with lower SUV cannot be ruled out in case conference consensus of opinion was  to  repeat CT scan without contrast in 6 weeks to be sure that pulmonary infiltrate has cleared up.pulmonary infiltrate increase his all remains persistent and biopsy can be arranged.  Patient and family is agreeable to it  CC Referral:   cc: Dr. Raul Del, PCP is Dr. Eliezer Lofts   Electronic Signatures: Jobe Gibbon (MD)  (Signed 31-Jul-13 13:08)  Authored: Note Type, History of Present Illness, CC/HPI, Review of Systems, ALLERGIES, PAST MEDICAL HISTORY, Preventive Screening, Smoking Cessation, Patient Family Social History, HOME  MEDICATIONS, Vital Signs, Physical Exam, Lab Results Review, Pathology Report Review, Rad Results Review, Assessment and Plan, CC Referring Physician   Last Updated: 31-Jul-13 13:08 by Jobe Gibbon (MD)  References: 1.  Data Referenced From "[place] Office Nurse Note" 14-May-2012 10:01 AM

## 2015-02-01 NOTE — Consult Note (Signed)
Brief Consult Note: Diagnosis: C. diff colitis.   Patient was seen by consultant.   Comments: C. diff colitis. Probable pneumonia. UTI.  Recommendations: Agree with PO Flagyl. Agree that broad spectrum antibiotics can not be discontinued and as leucocytosis and diarrhea as well as fever is improving will continue with current management.  Electronic Signatures: Jill Side (MD)  (Signed 03-Sep-13 12:14)  Authored: Brief Consult Note   Last Updated: 03-Sep-13 12:14 by Jill Side (MD)

## 2015-02-01 NOTE — Consult Note (Signed)
PATIENT NAME:  Elizabeth Silva, Elizabeth Silva MR#:  [id number] DATE OF BIRTH:  [birth year]  DATE OF CONSULTATION:  06/18/2012  REFERRING PHYSICIAN:   CONSULTING PHYSICIAN:  Jill Side, MD  REASON FOR CONSULTATION: Colitis.   HISTORY OF PRESENT ILLNESS: A 81 year old female with history of diverticulosis, chronic obstructive pulmonary disease, osteoarthritis, hyperlipidemia, history of C. difficile colitis in the past. The patient was admitted with fever, weakness as well as cough productive of sputum. She was noted to have a white cell count of 21,000. Chest x-ray did show some right lung infiltrate. The patient was also complaining of diarrhea, and CT scan of the abdomen was consistent with diverticulosis but no other acute abnormalities were noted. The patient also was noted to have urinary tract infection. The patient has been started on intravenous antibiotic as stool studies came back as positive for C. difficile, and patient was started on Flagyl as well. Patient has suspected lung mass and has been undergoing workup by the [place].   PAST MEDICAL HISTORY: Significant for:  1. History of osteoarthritis.   2. Chronic obstructive pulmonary disease.  3. Hyperlipidemia.  4. History of C. difficile colitis in the past. 5. Chronic kidney disease.  6. Hypertension.   PAST SURGICAL HISTORY: Cholecystectomy.   ALLERGIES: Penicillin, Levaquin, and potassium.   SOCIAL HISTORY: She does not smoke or drink.   FAMILY HISTORY: Unremarkable.   HOME MEDICATIONS:  1. Spiriva. 2. Tylenol. 3. Proventil. 4. Prilosec. 5. Norvasc. 6. Lasix. 7. Hydralazine. 8. Gabapentin. 9. Bystolic. 10. Aspirin.   REVIEW OF SYSTEMS: Positive for cough, fever. Cough is productive of sputum. The patient is also complaining of lower abdominal discomfort and diarrhea.   PHYSICAL EXAMINATION:  GENERAL: Frail-appearing, elderly female, does not appear to be toxic or septic though. Heart rate is in 60s and 70s.  Temperature is about 98.2, blood pressure 130/61.   LUNGS: Grossly clear with some scattered crackles bilaterally.   CARDIOVASCULAR: Regular rate and rhythm.   ABDOMEN: Slightly distended. Bowel sounds are positive and hyperactive. Mild lower abdominal tenderness was noted without any rebound or guarding.   NEUROLOGIC: Examination appears to be fairly unremarkable.   LABORATORIES: Her white cell count was 21,000 on admission, is down to 10.9. Hemoglobin is 10.8, hematocrit 32.5, and platelet count of 202,000. Troponin is less than 0.02. Serum lipase is normal. Liver enzymes are fairly unremarkable. Creatinine is 1.73. Stool for C. difficile toxin is positive. Urine culture showed more than 100,000 klebsiella. CT scan of abdomen and pelvis without contrast showed sigmoid diverticulosis but no evidence of diverticulitis; some thickening of the wall of the urinary bladder was noted.   ASSESSMENT AND PLAN: Patient with what appears to be questionable pneumonia with underlying lung mass which is not certain at this point. Patient also has klebsiella urinary tract infection. The patient is also complaining of diarrhea, and stool for C. difficile toxin is positive. Rather complicated situation. Currently patient is on multiple antibiotics including azithromycin, ceftriaxone as well as oral Flagyl. Her white cell count has resolved. Her diarrhea seems to be improving; and therefore, I agree with current management. Ideally the broad-spectrum      antibiotics should be discontinued but with klebsiella urinary tract infection as well as pneumonia, this would not be acceptable. We will continue her on present management for now and continue to follow her closely. Further recommendations to follow.    ____________________________ Jill Side, MD si:vtd D: 06/18/2012 20:46:28 ET T: 06/19/2012 09:43:23 ET JOB#: [id number]  cc: [employer]  Dionne Milo, MD, <Dictator> Jill Side MD ELECTRONICALLY SIGNED  06/25/2012 11:03

## 2015-02-01 NOTE — Consult Note (Signed)
History of Present Illness:   Reason for Consult Abnormal CT scan of the chest    HPI   81 year old lady started having low-grade fever and cough in May of 2013.  Patient had a chest x-ray which was a normal so CT scan was done in June as described above revealed right middle lobe infiltration.  With questionable enlarged lymphadenopathy in mediastinal.  Patient was referred to a pulmonologist.  Patient had a PET scan done in here to discuss the results and further followup.  According to her she is feeling somewhat better.  No chest pain.  No hemoptysis.  Appetite has been stable.  Has not lost significant amount of weight. 3, 2013 Patient started having chills fever increasing shortness of breath was admitted in the hospital we diagnosis of pneumonia.  CT scan shows progressive infiltrating right lower lobe.  However the as to evaluate patient.no hemoptysis.  No chest pain.  PFSH:   Comments Family history of colon cancer and lung cancer.  Hypertensin    Social History negative alcohol    Comments Has smoked for 30 years prior to quitting.  Patient quit smoking 30 years ago.    Additional Past Medical and Surgical History As mentioned above   Review of Systems:   General fever  chills  weakness    Performance Status (ECOG) 2    HEENT no complaints    Lungs cough  SOB    GI no complaints    GU no complaints    Musculoskeletal no complaints    Extremities no complaints    Skin no complaints    Neuro no complaints    Endocrine no complaints    Psych no complaints    Review of Systems   patient does have some arthritis pain  NURSING NOTES: **Vital Signs.:   03-Sep-13 04:30    Vital Signs Type: Routine    Temperature Temperature (F): 100.2    Celsius: 37.8    Temperature Source: Oral    Pulse Pulse: 70    Respirations Respirations: 20    Systolic BP Systolic BP: 381    Diastolic BP (mmHg) Diastolic BP (mmHg): 62    Mean BP: 90    Pulse Ox % Pulse Ox  %: 96    Pulse Ox Activity Level: At rest    Oxygen Delivery: 2L   Physical Exam:   General This is alert and oriented in mild respiratory distress.    HEENT: normal    Lungs: rales  crepitations  wheezing  Right lower lung    Cardiac: Tachycardia    Breast: not examined    Abdomen: soft  nontender  positive bowel sounds    Skin: intact    Extremities: No edema, rash or cyanosis    Neuro: AAOx3  cranial nerves intact  No focal symptom    Psych: normal appearance    Physical Exam No palpable lymphadenopathy     Renal Insufficiency:    Hyperlipidemia:    COPD:    Diverticulitis:    Cervical disc disease:    Arthritis:    HTN:    Oopherectomy: bilateral   Cholecystectomy:    Rhinoplasty: YEARS AGO   Hysterectomy - Total:    PCN: Unknown  Levofloxacin: GI Distress  Potassium Chloride: Unknown    vitamin E 1000 intl units oral capsule: 1 cap(s) orally once a day, Active, 0, None   aspirin 81 mg oral tablet: 1 tab(s) orally once a day, Active, 0, None  Tylenol Extra Strength PM 500 mg-25 mg oral tablet: 1 tab(s) orally once a day (at bedtime), Active, 0, None   Bystolic 10 mg oral tablet: 1 tab(s) orally once a day, Active, 0, None   gabapentin 600 mg oral tablet: 1 tab(s) orally once a day (at bedtime), Active, 0, None   hydrALAZINE 50 mg oral tablet: 1 tab(s) orally 3 times a day, Active, 0, None   Proventil HFA: 2 puff(s) orally every 4 hours, Active, 0, None   Norvasc 5 mg oral tablet: 1 tab(s) orally once a day, Active, 0, None   Spiriva 18 mcg inhalation capsule: 1 each inhaled once a day, Active, 0, None   Prilosec 40 mg oral delayed release capsule: 1 cap(s) orally once a day, Active, 0, None   Lasix 20 mg oral tablet: 1 tab(s) orally once a day, Active, 0, None   fesoterodine 8 mg oral tablet, extended release: 1 tab(s) orally once a day, Active, 0, None   red yeast rice 600 mg oral capsule: 1 cap(s) orally once a day, Active, 0,  None  Laboratory Results: Thyroid:  03-Sep-13 09:14    Thyroid Stimulating Hormone 0.670 (0.45-4.50 (International Unit)  ----------------------- Pregnant patients have  different reference  ranges for TSH:  - - - - - - - - - -  Pregnant, first trimetser:  0.36 - 2.50 uIU/mL)  Cardiology:  03-Sep-13 09:34    Ventricular Rate 77   Atrial Rate 77   P-R Interval 160   QRS Duration 136   QT 400   QTc 452   P Axis 41   R Axis -24   T Axis 63  Routine Chem:  03-Sep-13 03:19    Glucose, Serum 92   BUN  30   Creatinine (comp)  1.80   Sodium, Serum 142   Potassium, Serum 3.8   Chloride, Serum  111   CO2, Serum 24   Calcium (Total), Serum 8.8   Anion Gap 7   Osmolality (calc) 289   eGFR (African American)  28   eGFR (Non-African American)  24 (eGFR values <17m/min/1.73 m2 may be an indication of chronic kidney disease (CKD). Calculated eGFR is useful in patients with stable renal function. The eGFR calculation will not be reliable in acutely ill patients when serum creatinine is changing rapidly. It is not useful in  patients on dialysis. The eGFR calculation may not be applicable to patients at the low and high extremes of body sizes, pregnant women, and vegetarians.)    09:14    Magnesium, Serum 1.8 (1.8-2.4 THERAPEUTIC RANGE: 4-7 mg/dL TOXIC: > 10 mg/dL  -----------------------)  Cardiac:  03-Sep-13 09:14    Troponin I < 0.02 (0.00-0.05 0.05 ng/mL or less: NEGATIVE  Repeat testing in 3-6 hrs  if clinically indicated. >0.05 ng/mL: POTENTIAL  MYOCARDIAL INJURY. Repeat  testing in 3-6 hrs if  clinically indicated. NOTE: An increase or decrease  of 30% or more on serial  testing suggests a  clinically important change)   CK, Total 65 (Result(s) reported on 17 Jun 2012 at 10:59AM.)   CPK-MB, Serum 0.6 (Result(s) reported on 17 Jun 2012 at 09:58AM.)  Routine Hem:  03-Sep-13 03:19    WBC (CBC) 10.9   RBC (CBC)  3.55   Hemoglobin (CBC)  10.8   Hematocrit  (CBC)  32.5   Platelet Count (CBC) 202   MCV 92   MCH 30.4   MCHC 33.2   RDW  14.9   Neutrophil % 63.5  Lymphocyte % 19.2   Monocyte % 12.6   Eosinophil % 3.4   Basophil % 1.3   Neutrophil #  6.9   Lymphocyte # 2.1   Monocyte #  1.4   Eosinophil # 0.4   Basophil # 0.1 (Result(s) reported on 17 Jun 2012 at 03:57AM.)   Assessment and Plan:  Impression:   1.patient has progressive right lower lobe infiltrate.  Also had leukocytosis and low-grade fever.  Possibility of pulmonary infection with underlying pulmonary pathology like malignancy cannot be ruled out.  After patient is treated with few days of antibiotic we will repeat CT scan if infiltrate persists then possibility of biopsy versus bronchoscopy can be considered.. had detailed discussion with patient and family and they are in agreement with it  CC Referral:   cc: Dr. Raul Del, PCP is Dr. Eliezer Lofts   Electronic Signatures: Jobe Gibbon (MD)  (Signed 03-Sep-13 17:42)  Authored: HISTORY OF PRESENT ILLNESS, PFSH, ROS, NURSING NOTES, PE, PAST MEDICAL HISTORY, ALLERGIES, HOME MEDICATIONS, LABS, ASSESSMENT AND PLAN, CC Referring Physician   Last Updated: 03-Sep-13 17:42 by Jobe Gibbon (MD)

## 2015-02-01 NOTE — Discharge Summary (Signed)
PATIENT NAME:  Elizabeth Silva, Elizabeth Silva MR#:  [id number] DATE OF BIRTH:  1918/10/13  DATE OF ADMISSION:  06/15/2012 DATE OF DISCHARGE:  06/20/2012  DISCHARGE DIAGNOSES: 1. Sepsis, present on admission, now treated and resolved. 2. Recurrent pneumonia, clinically improving on antibiotic, will require outpatient oncology for evaluation of right-sided lung cancer. May require repeat computerized tomography scan per Dr. Oliva Bustard as an outpatient in a few weeks.  3. Clostridium difficile colitis, improving on Flagyl.  4. Bradycardia with occasional premature ventricular contractions, stable. He does have chronic left bundle branch block.  5. Klebsiella urinary tract infection, treated with antibiotics.  6. Acute on chronic kidney disease, stage III with a baseline creatinine of 1.7 to 1.8, now close to baseline.  7. Acute on chronic kidney disease, creatinine of 1.8 on 09/05 before discharge. May need close monitoring.   SECONDARY DIAGNOSES:  1. Cervical disk disease.  2. Osteoarthritis.  3. Diverticulosis. 4. Chronic obstructive pulmonary disease.  5. Hyperlipidemia.  6. History of Clostridium difficile colitis.  7. Chronic kidney disease, stage III.  8. Hypertension.   CONSULTATIONS:  1. Oncology, Dr. Oliva Bustard.  2. Gastroenterology  Dr. Dionne Milo.  3. Physical therapy.   LABORATORY, DIAGNOSTIC AND RADIOLOGICAL DATA: CT scan of the chest without contrast on 09/02 showed right upper and lower lobe infiltrate. Also, has density in the left upper lobe, possible emphysematous lung disease. CT scan the abdomen and pelvis without contrast on 09/01 showed sigmoid diverticulosis without acute diverticulitis. No perforation or abscess. Possible cystitis. Degenerative joint disease of the spine present. Minimal atelectasis at the lung bases with some nodularity in the right middle lobe. Chest x-ray on 09/01 showed right upper lobe pneumonia. Urinalysis on admission showed 11 WBCs, trace bacteria, trace leukocyte  esterase. Stool for C. difficile was positive. Blood cultures x2 were negative. Urine culture grew more than 100,000 colonies of Klebsiella pneumoniae.  HISTORY AND SHORT HOSPITAL COURSE: The patient is a 81 year old female with above-mentioned medical problems who was admitted for fever, cough, leukocytosis and was found to have pneumonia. She was started on IV antibiotics. She was evaluated by pulmonary, Dr. Humphrey Rolls, who agreed with current management. She was also having diarrhea for which a gastroenterology consultation was obtained. She was found to have stool for C. difficile positive and was started on oral Flagyl. Oncology consultation was obtained with Dr. Oliva Bustard as the patient had been following with him as an outpatient for possible malignancy. Dr. Oliva Bustard recommended continuing antibiotic and repeating CT scan in a few weeks as an outpatient to see if she has any underlying mass. The patient was slowly improving on antibiotic regimen along with Flagyl. Her diarrhea was also improved. She was slowly tolerating diet and was discharged home on 09/06 as she was close to baseline.   PERTINENT PHYSICAL EXAMINATION ON THE DATE OF DISCHARGE: On the date of discharge, her vital signs were as follows: Temperature 97.9, heart rate 75 per minute, respirations 20 per minute, blood pressure 145/68. She was saturating 94% on room air. CARDIOVASCULAR: S1, S2 normal. No murmurs, rubs, or gallops. LUNGS: Clear to auscultation bilaterally. No wheezing, rales, rhonchi, or crepitation. ABDOMEN: Soft, benign. NEUROLOGIC: Nonfocal examination. All other physical examination remained at baseline.   DISCHARGE MEDICATIONS:  1. Vitamin E 1000 international units once daily.  2. Aspirin 81 mg p.o. daily.  3. Tylenol extra strength PM, 500/25, one tablet p.o. at bedtime.  4. Gabapentin 600 mg p.o. at bedtime.  5. Hydralazine 50 mg p.o. 2 times daily.  6.  Proventil 2 puffs every four hours.  7. Norvasc 5 mg p.o. daily.   8. Spiriva once daily.  9. Prilosec 40 mg p.o. daily.  10. Lasix 20 mg p.o. daily.  11. Fesoterodine 8 milligrams p.o. daily. 12. Red yeast rice 600 mg p.o. daily.  13. Bystolic 10 mg half tablet p.o. daily.  14. Metronidazole 500 mg p.o. 3 times daily for nine more days. 15. Guaifenesin 600 mg p.o. b.i.d.  16. Ceftin 500 mg p.o. b.i.d. for five more days.   DISCHARGE DIET: Low sodium, low fat, low cholesterol.   DISCHARGE ACTIVITY: As tolerated.   DISCHARGE INSTRUCTIONS AND FOLLOW-UP:  1. The patient was instructed to follow-up with her primary care physician, Dr. Eliezer Lofts, in 1 to 2 weeks.  2. She will need follow-up with Dr. Forest Gleason in 1 to 2 weeks at [facility].  3. She will also need follow-up with Dr. Raul Del from pulmonary in 4 to 6 weeks.  4. She was set up to get home health physical therapy, nursing, and nursing aide by care management.  5. She will need CBC and basic metabolic panel check in one week with results forwarded to primary care physician for kidney function evaluation and monitoring.   TOTAL TIME DISCHARGING THIS PATIENT: 55 minutes.     ____________________________ Lucina Mellow. Manuella Ghazi, MD vss:ap D: 06/21/2012 14:52:29 ET T: 06/23/2012 13:32:35 ET JOB#: [id number]  cc: Rebecka Oelkers S. Manuella Ghazi, MD, <Dictator> Amy Cletis [place], MD Martie Lee. [place], MD Herbon E. Raul Del, MD Jill Side, MD Lucina Mellow [facility] MD ELECTRONICALLY SIGNED 06/23/2012 16:05

## 2015-02-01 NOTE — Consult Note (Signed)
Chief Complaint:   Subjective/Chief Complaint Feels better. One BM today. Leucocytosis improved.   VITAL SIGNS/ANCILLARY NOTES: **Vital Signs.:   05-Sep-13 08:13   Vital Signs Type Routine   Temperature Temperature (F) 98.8   Celsius 37.1   Pulse Pulse 73   Respirations Respirations 20   Systolic BP Systolic BP 161   Diastolic BP (mmHg) Diastolic BP (mmHg) 67   Mean BP 88   Pulse Ox % Pulse Ox % 94   Pulse Ox Activity Level  At rest   Oxygen Delivery Room Air/ 21 %   Lab Results: Hepatic:  05-Sep-13 04:54    Bilirubin, Total 0.3   Alkaline Phosphatase  189   SGPT (ALT) 41   SGOT (AST)  42   Total Protein, Serum  5.8   Albumin, Serum  2.6  Routine Chem:  05-Sep-13 04:54    Glucose, Serum  106   BUN  27   Creatinine (comp)  1.84   Sodium, Serum 140   Potassium, Serum 3.9   Chloride, Serum  108   CO2, Serum 22   Calcium (Total), Serum  8.4   Osmolality (calc) 285   eGFR (African American)  27   eGFR (Non-African American)  23 (eGFR values <81m/min/1.73 m2 may be an indication of chronic kidney disease (CKD). Calculated eGFR is useful in patients with stable renal function. The eGFR calculation will not be reliable in acutely ill patients when serum creatinine is changing rapidly. It is not useful in  patients on dialysis. The eGFR calculation may not be applicable to patients at the low and high extremes of body sizes, pregnant women, and vegetarians.)   Anion Gap 10  Routine Hem:  05-Sep-13 04:54    WBC (CBC)  11.3   RBC (CBC)  3.29   Hemoglobin (CBC)  9.5   Hematocrit (CBC)  30.0   Platelet Count (CBC) 246   MCV 91   MCH 29.0   MCHC  31.8   RDW  14.7   Neutrophil % 72.2   Lymphocyte % 11.3   Monocyte % 11.0   Eosinophil % 4.2   Basophil % 1.3   Neutrophil #  8.2   Lymphocyte # 1.3   Monocyte #  1.2   Eosinophil # 0.5   Basophil # 0.1 (Result(s) reported on 19 Jun 2012 at 05:38AM.)   Assessment/Plan:  Assessment/Plan:   Assessment Pneumonia/  UTI. C. diff colitis. Clinically improving.    Plan Continue Flagyl for a total of two weeks. No further recommendations. Will sign off. Please call on call GI if needed.   Electronic Signatures: IJill Side(MD)  (Signed 05-Sep-13 12:20)  Authored: Chief Complaint, VITAL SIGNS/ANCILLARY NOTES, Lab Results, Assessment/Plan   Last Updated: 05-Sep-13 12:20 by IJill Side(MD)

## 2015-03-02 ENCOUNTER — Other Ambulatory Visit: Payer: Self-pay | Admitting: Family Medicine

## 2015-03-07 DIAGNOSIS — H3531 Nonexudative age-related macular degeneration: Secondary | ICD-10-CM | POA: Diagnosis not present

## 2015-03-09 ENCOUNTER — Other Ambulatory Visit: Payer: Self-pay | Admitting: Family Medicine

## 2015-04-01 ENCOUNTER — Encounter: Payer: Self-pay | Admitting: Family Medicine

## 2015-04-01 ENCOUNTER — Ambulatory Visit (INDEPENDENT_AMBULATORY_CARE_PROVIDER_SITE_OTHER): Payer: Medicare Other | Admitting: Family Medicine

## 2015-04-01 VITALS — BP 158/60 | HR 72 | Temp 98.0°F | Wt 128.5 lb

## 2015-04-01 DIAGNOSIS — N3001 Acute cystitis with hematuria: Secondary | ICD-10-CM

## 2015-04-01 DIAGNOSIS — J441 Chronic obstructive pulmonary disease with (acute) exacerbation: Secondary | ICD-10-CM | POA: Diagnosis not present

## 2015-04-01 DIAGNOSIS — R350 Frequency of micturition: Secondary | ICD-10-CM | POA: Diagnosis not present

## 2015-04-01 LAB — POCT URINALYSIS DIPSTICK
Bilirubin, UA: NEGATIVE
Blood, UA: NEGATIVE
Glucose, UA: NEGATIVE
Ketones, UA: NEGATIVE
Nitrite, UA: NEGATIVE
Spec Grav, UA: 1.02
Urobilinogen, UA: 4
pH, UA: 6

## 2015-04-01 LAB — POCT UA - MICROSCOPIC ONLY

## 2015-04-01 MED ORDER — PREDNISONE 20 MG PO TABS: ORAL_TABLET | ORAL | Status: DC

## 2015-04-01 MED ORDER — CIPROFLOXACIN HCL 250 MG PO TABS: 250.0000 mg | ORAL_TABLET | Freq: Two times a day (BID) | ORAL | Status: DC

## 2015-04-01 NOTE — Progress Notes (Signed)
Subjective:    Patient ID: Elizabeth Silva, female    DOB: [birth year], 81 y.o.   MRN: [id number]  Urinary Tract Infection  This is a new problem. The current episode started 1 to 4 weeks ago. The problem has been gradually worsening. The quality of the pain is described as burning. The pain is moderate. There has been no fever. She is not sexually active. There is no history of pyelonephritis. Associated symptoms include frequency and hesitancy. Pertinent negatives include no chills, hematuria, nausea, urgency or vomiting. She has tried increased fluids for the symptoms. The treatment provided mild relief. Her past medical history is significant for recurrent UTIs. There is no history of catheterization, kidney stones, a single kidney, urinary stasis or a urological procedure.  Cough This is a new problem. The current episode started 1 to 4 weeks ago (2 week). The cough is productive of sputum. Associated symptoms include ear pain, nasal congestion, shortness of breath and wheezing. Pertinent negatives include no chills, fever, hemoptysis, sore throat or weight loss. Associated symptoms comments: occ right ear pain momentary.. The symptoms are aggravated by lying down. Risk factors: nonsmoker. Treatments tried: mucinex. The treatment provided moderate relief.   Had COPD exac in  09/2014 placed on azithromycin and prednsione.  She had had resolution of cough entirely, always with some baseline SOB.    Social History /Family History/Past Medical History reviewed and updated if needed.  Last UTI 01/2015 Klebsiella 07/2013 enterobacter   Review of Systems  Constitutional: Negative for fever, chills and weight loss.  HENT: Positive for ear pain. Negative for sore throat.   Respiratory: Positive for cough, shortness of breath and wheezing. Negative for hemoptysis.   Gastrointestinal: Positive for abdominal pain. Negative for nausea, vomiting, diarrhea and constipation.  Genitourinary: Positive for  hesitancy and frequency. Negative for urgency and hematuria.  Musculoskeletal: Negative for back pain.       No cva tenderness         Objective:   Physical Exam  Constitutional: Vital signs are normal. She appears well-developed and well-nourished. She is cooperative.  Non-toxic appearance. She does not appear ill. No distress.  Elderly female in NAD using cane  HENT:  Head: Normocephalic.  Right Ear: Hearing, tympanic membrane, external ear and ear canal normal. Tympanic membrane is not erythematous, not retracted and not bulging.  Left Ear: Hearing, tympanic membrane, external ear and ear canal normal. Tympanic membrane is not erythematous, not retracted and not bulging.  Nose: Nose normal. No mucosal edema or rhinorrhea. Right sinus exhibits no maxillary sinus tenderness and no frontal sinus tenderness. Left sinus exhibits no maxillary sinus tenderness and no frontal sinus tenderness.  Mouth/Throat: Uvula is midline, oropharynx is clear and moist and mucous membranes are normal. No oropharyngeal exudate.  Eyes: Conjunctivae, EOM and lids are normal. Pupils are equal, round, and reactive to light. Lids are everted and swept, no foreign bodies found. Right eye exhibits no discharge. Left eye exhibits no discharge. No scleral icterus.  Neck: Trachea normal and normal range of motion. Neck supple. Carotid bruit is not present. No thyroid mass and no thyromegaly present.  Cardiovascular: Normal rate, regular rhythm, S1 normal, S2 normal, normal heart sounds, intact distal pulses and normal pulses.  Exam reveals no gallop and no friction rub.   No murmur heard. Pulmonary/Chest: Effort normal. No tachypnea. No respiratory distress. She has no decreased breath sounds. She has wheezes in the right lower field and the left lower field. She has rhonchi  in the right lower field. She has no rales.  Abdominal: Soft. Normal appearance and bowel sounds are normal. There is tenderness in the suprapubic  area. There is no rigidity, no guarding and no CVA tenderness.  Mild ttp suprapubic  Neurological: She is alert.  Skin: Skin is warm, dry and intact. No rash noted.  Psychiatric: Her speech is normal and behavior is normal. Judgment and thought content normal. Her mood appears not anxious. Cognition and memory are normal. She does not exhibit a depressed mood.          Assessment & Plan:

## 2015-04-01 NOTE — Assessment & Plan Note (Signed)
UA and history concerning for infection. Pt with UTI in past. Send urine for culture. Will treat with cipro x 7 days to cover both respiratory infection and UTI

## 2015-04-01 NOTE — Patient Instructions (Signed)
We will call with culture results.  Treat COPD exacerbation and UTI with cipro x 7 days.  Complete prednisone taper. Push fluids.

## 2015-04-01 NOTE — Assessment & Plan Note (Signed)
Treat with antibiotics and prednisone taper. If not improving in 48 hours, re-eval.

## 2015-04-01 NOTE — Progress Notes (Signed)
Pre visit review using our clinic review tool, if applicable. No additional management support is needed unless otherwise documented below in the visit note. 

## 2015-04-04 LAB — URINE CULTURE

## 2015-04-16 ENCOUNTER — Other Ambulatory Visit: Payer: Self-pay | Admitting: Family Medicine

## 2015-04-17 MED ORDER — TIOTROPIUM BROMIDE MONOHYDRATE 18 MCG IN CAPS: ORAL_CAPSULE | RESPIRATORY_TRACT | Status: DC

## 2015-04-17 MED ORDER — AMLODIPINE BESYLATE 5 MG PO TABS: ORAL_TABLET | ORAL | Status: DC

## 2015-04-17 MED ORDER — NEBIVOLOL HCL 10 MG PO TABS: ORAL_TABLET | ORAL | Status: DC

## 2015-04-17 MED ORDER — OMEPRAZOLE 40 MG PO CPDR: DELAYED_RELEASE_CAPSULE | ORAL | Status: DC

## 2015-04-17 NOTE — Telephone Encounter (Signed)
Last office visit 04/01/2015.  Last refilled 12/21/2014 for #90 with no refills.  Ok to refill?

## 2015-04-18 MED ORDER — AMLODIPINE BESYLATE 5 MG PO TABS: ORAL_TABLET | ORAL | Status: DC

## 2015-04-18 MED ORDER — NEBIVOLOL HCL 10 MG PO TABS: ORAL_TABLET | ORAL | Status: DC

## 2015-04-18 MED ORDER — OMEPRAZOLE 40 MG PO CPDR: DELAYED_RELEASE_CAPSULE | ORAL | Status: DC

## 2015-04-18 MED ORDER — TIOTROPIUM BROMIDE MONOHYDRATE 18 MCG IN CAPS: ORAL_CAPSULE | RESPIRATORY_TRACT | Status: DC

## 2015-04-19 ENCOUNTER — Ambulatory Visit: Payer: Medicare Other | Admitting: Family Medicine

## 2015-04-26 ENCOUNTER — Encounter: Payer: Self-pay | Admitting: Cardiovascular Disease

## 2015-04-26 ENCOUNTER — Ambulatory Visit (INDEPENDENT_AMBULATORY_CARE_PROVIDER_SITE_OTHER): Payer: Medicare Other | Admitting: Cardiovascular Disease

## 2015-04-26 VITALS — BP 140/48 | HR 67 | Ht 61.0 in | Wt 127.0 lb

## 2015-04-26 DIAGNOSIS — I6522 Occlusion and stenosis of left carotid artery: Secondary | ICD-10-CM | POA: Diagnosis not present

## 2015-04-26 DIAGNOSIS — I5032 Chronic diastolic (congestive) heart failure: Secondary | ICD-10-CM

## 2015-04-26 DIAGNOSIS — E7800 Pure hypercholesterolemia, unspecified: Secondary | ICD-10-CM

## 2015-04-26 DIAGNOSIS — I10 Essential (primary) hypertension: Secondary | ICD-10-CM

## 2015-04-26 DIAGNOSIS — R5382 Chronic fatigue, unspecified: Secondary | ICD-10-CM

## 2015-04-26 DIAGNOSIS — I872 Venous insufficiency (chronic) (peripheral): Secondary | ICD-10-CM | POA: Diagnosis not present

## 2015-04-26 MED ORDER — LOVASTATIN 20 MG PO TABS: 20.0000 mg | ORAL_TABLET | Freq: Every day | ORAL | Status: DC

## 2015-04-26 NOTE — Assessment & Plan Note (Signed)
40% disease on the left. Recommended she continue on her lovastatin

## 2015-04-26 NOTE — Progress Notes (Signed)
Patient ID: Elizabeth Silva, female    DOB: [birth year], 81 y.o.   MRN: [id number]  HPI Comments: 81 yo with history of malignant HTN, COPD, hyperlipidemia, left bundle-branch block, moderate tricuspid valve regurgitation since 2011 with normal ejection fraction in 2012, presents today for routine followup of her hypertension. Ophthalmologist feels her vision loss is secondary to atherosclerotic disease and hypertension. vision loss in her left eye. 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  She reports that her blood pressure has been actively well-controlled with systolic pressure 299B, diastolic pressures in the 50 range Vision continues to cause major issues, some progression Gait is stable, no recent falls She was taking lovastatin 20 mg daily with 30 point drop in her cholesterol with no side effects. She stopped the medication as she did not feel it was doing anything She is currently in the doughnut hole and paying $400 for spiriva She does report periods of fatigue, sometimes does not sleep well  EKG on today's visit shows normal sinus rhythm with left bundle branch block  Other past medical history She does report having myalgias on simvastatin in the past  She Continues to live independently.   She lives on a split level, uses the stairs frequently. Chronic fatigue/poor energy poor several years. She's not using her cane. Does not go out of the house much, once a week maybe goes to [facility] for sepsis, recurrent pneumonia, diagnosed with C. Difficile.  treated with Flagyl and diarrhea improved. Notes from the hospital indicate Klebsiella urinary tract infection treated with antibiotics, C. difficile improving on Flagyl, bradycardia, recurrent pneumonia that was improving on antibiotics, sepsis that was treated and improved, chronic kidney disease.    Previous  Echo was suggestive of diastolic dysfunction with normal EF (60-65%). Moderate TR          Allergies  Allergen Reactions  . Levofloxacin     REACTION: Burning in legs  . Penicillins     REACTION: Rash    Outpatient Encounter Prescriptions as of 04/26/2015  Medication Sig  . acetaminophen (TYLENOL) 500 MG tablet Take 500 mg by mouth every 6 (six) hours as needed.    Marland Kitchen albuterol (PROAIR HFA) 108 (90 BASE) MCG/ACT inhaler Inhale 2 puffs into the lungs every 6 (six) hours as needed for wheezing or shortness of breath.  Marland Kitchen amLODipine (NORVASC) 5 MG tablet Take 1 tablet by mouth two  times daily  . aspirin 81 MG EC tablet Take 81 mg by mouth daily.    . ferrous fumarate (HEMOCYTE - 106 MG FE) 325 (106 FE) MG TABS tablet Take 1 tablet (106 mg of iron total) by mouth daily.  . fluticasone (FLONASE) 50 MCG/ACT nasal spray Place 2 sprays into both nostrils daily.  . Fluticasone-Salmeterol (ADVAIR DISKUS) 250-50 MCG/DOSE AEPB Inhale 1 puff into the lungs 2 (two) times daily.  . furosemide (LASIX) 20 MG tablet Take 1 tablet (20 mg total) by mouth daily as needed.  . gabapentin (NEURONTIN) 600 MG tablet Take 1 tablet by mouth at  bedtime  . hydrALAZINE (APRESOLINE) 100 MG tablet Take 1 tablet by mouth 3  times a day  . nebivolol (BYSTOLIC) 10 MG tablet Take 1 tablet by mouth  daily  . omeprazole (PRILOSEC) 40 MG capsule Take 1 capsule by mouth  daily  . oxybutynin (DITROPAN) 5 MG tablet Take 1 tablet by mouth two  times daily  . predniSONE (DELTASONE) 20 MG tablet 3 tabs by mouth daily  x 3 days, then 2 tabs by mouth daily x 2 days then 1 tab by mouth daily x 2 days  . Probiotic Product (PROBIOTIC DAILY PO) Take 1 capsule by mouth daily.  Marland Kitchen tiotropium (SPIRIVA HANDIHALER) 18 MCG inhalation capsule Inhale the contents of 1  capsule via HandiHaler  daily  . vitamin E 1000 UNIT capsule Take 1,000 Units by mouth daily.    Marland Kitchen lovastatin (MEVACOR) 20 MG tablet Take 1 tablet (20 mg total) by mouth at bedtime.  . [DISCONTINUED] ciprofloxacin (CIPRO) 250 MG tablet Take 1 tablet (250 mg total) by  mouth 2 (two) times daily. (Patient not taking: Reported on 04/26/2015)   No facility-administered encounter medications on file as of 04/26/2015.    Past Medical History  Diagnosis Date  . Allergic rhinitis   . COPD (chronic obstructive pulmonary disease)   . Diverticulosis of colon   . GERD (gastroesophageal reflux disease)   . Hyperlipidemia   . Hypertension     LE swelling with amlodipine, intolerant of ACEIs  . Osteoarthritis   . CKD (chronic kidney disease)   . Impaired vision     right eye, was told due to HTN  . LBBB (left bundle branch block)   . Diastolic CHF, acute     echo (4/11) with EF 60-65%,mild LVH, mild aortic insufficiency, mild MR, PA systolic pressure 50-09 mmHg, mild to moderate TR  . History of PFTs   . H/O Clostridium difficile infection Sept. 2013    Past Surgical History  Procedure Laterality Date  . Cardiac catheterization  1999    normal  . Total abdominal hysterectomy  1970    suspicious cells  . Cholecystectomy  1980  . Cervical disc surgery  2005    cerv. disc, dz. C5-C7, facet arthritis    Social History  reports that she quit smoking about 30 years ago. Her smoking use included Cigarettes. She smoked 1.00 pack per day. She has never used smokeless tobacco. She reports that she drinks alcohol. She reports that she does not use illicit drugs.  Family History family history includes Cancer in her brother; Colon cancer in her sister; Dementia in her father and sister; Diabetes in her brother; Heart block in her mother; Lung cancer in her brother.  Review of Systems  Constitutional: Positive for fatigue.  Eyes: Positive for visual disturbance.  Respiratory: Negative.   Cardiovascular: Negative.   Gastrointestinal: Negative.   Musculoskeletal: Positive for gait problem.  Neurological: Positive for weakness.  Hematological: Negative.   Psychiatric/Behavioral: Negative.   All other systems reviewed and are negative.  BP 140/48 mmHg  Pulse  67  Ht '5\' 1"'$  (1.549 m)  Wt 127 lb (57.607 kg)  BMI 24.01 kg/m2  Physical Exam  Constitutional: She is oriented to person, place, and time. She appears well-developed and well-nourished.  HENT:  Head: Normocephalic.  Nose: Nose normal.  Mouth/Throat: Oropharynx is clear and moist.  Eyes: Conjunctivae are normal. Pupils are equal, round, and reactive to light.  Neck: Normal range of motion. Neck supple. No JVD present. Carotid bruit is present.  Cardiovascular: Normal rate, regular rhythm, S1 normal, S2 normal and intact distal pulses.  Exam reveals no gallop and no friction rub.   Murmur heard.  Systolic murmur is present with a grade of 2/6  Pulmonary/Chest: Effort normal and breath sounds normal. No respiratory distress. She has no wheezes. She has no rales. She exhibits no tenderness.  Abdominal: Soft. Bowel sounds are normal. She exhibits no distension.  There is no tenderness.  Musculoskeletal: Normal range of motion. She exhibits no edema or tenderness.  Lymphadenopathy:    She has no cervical adenopathy.  Neurological: She is alert and oriented to person, place, and time. Coordination normal.  Skin: Skin is warm and dry. No rash noted. No erythema.  Psychiatric: She has a normal mood and affect. Her behavior is normal. Judgment and thought content normal.    Assessment and Plan  Nursing note and vitals reviewed.

## 2015-04-26 NOTE — Assessment & Plan Note (Signed)
Currently not taking Lasix. She feels breathing is stable No edema of the left lower extremity, trace to 1+ edema of the right lower extremity likely venous insufficiency. Compression hose in place

## 2015-04-26 NOTE — Assessment & Plan Note (Signed)
Recommended she restart her lovastatin 20 mg daily She had a 30 point drop in her cholesterol

## 2015-04-26 NOTE — Assessment & Plan Note (Signed)
Stable symptoms on the right, trace to mild edema. Compression hose in place. Minimal on the left

## 2015-04-26 NOTE — Assessment & Plan Note (Signed)
Blood pressure is well controlled on today's visit. No changes made to the medications. 

## 2015-04-26 NOTE — Assessment & Plan Note (Signed)
Chronic fatigue. Recommended she continue a regular walking or exercise program as tolerated

## 2015-04-26 NOTE — Patient Instructions (Addendum)
You are doing well. Please restart lovastatin once a day  Please take a lasix as needed for shortness of breath, worsening leg swelling on the left leg  Please call us if you have new issues that need to be addressed before your next appt.  Your physician wants you to follow-up in: 6 months.  You will receive a reminder letter in the mail two months in advance. If you don't receive a letter, please call our office to schedule the follow-up appointment.

## 2015-05-23 DIAGNOSIS — H43812 Vitreous degeneration, left eye: Secondary | ICD-10-CM | POA: Diagnosis not present

## 2015-05-23 DIAGNOSIS — H4311 Vitreous hemorrhage, right eye: Secondary | ICD-10-CM | POA: Diagnosis not present

## 2015-06-14 ENCOUNTER — Ambulatory Visit (INDEPENDENT_AMBULATORY_CARE_PROVIDER_SITE_OTHER): Payer: Medicare Other | Admitting: Family Medicine

## 2015-06-14 ENCOUNTER — Encounter: Payer: Self-pay | Admitting: Family Medicine

## 2015-06-14 ENCOUNTER — Telehealth: Payer: Self-pay

## 2015-06-14 VITALS — BP 140/40 | HR 66 | Temp 98.3°F | Ht 61.0 in | Wt 128.5 lb

## 2015-06-14 DIAGNOSIS — R300 Dysuria: Secondary | ICD-10-CM

## 2015-06-14 DIAGNOSIS — M153 Secondary multiple arthritis: Secondary | ICD-10-CM

## 2015-06-14 DIAGNOSIS — J441 Chronic obstructive pulmonary disease with (acute) exacerbation: Secondary | ICD-10-CM | POA: Diagnosis not present

## 2015-06-14 LAB — POCT URINALYSIS DIPSTICK
Bilirubin, UA: NEGATIVE
GLUCOSE UA: NEGATIVE
KETONES UA: NEGATIVE
Nitrite, UA: POSITIVE
Spec Grav, UA: 1.02
Urobilinogen, UA: 0.2
pH, UA: 5.5

## 2015-06-14 MED ORDER — ESTROGENS, CONJUGATED 0.625 MG/GM VA CREA: 1.0000 | TOPICAL_CREAM | Freq: Every day | VAGINAL | Status: DC

## 2015-06-14 MED ORDER — PREDNISONE 20 MG PO TABS: ORAL_TABLET | ORAL | Status: DC

## 2015-06-14 NOTE — Telephone Encounter (Signed)
Pt left v/m requesting cb about status of prednisone rx to [facility].

## 2015-06-14 NOTE — Telephone Encounter (Signed)
[facility] left v/m; pt thought prednisone rx was going to be prescribed today. Under 06/14/15 pt instructions noted start prednisone taper for COPD exacerbation but above that was noted prednisone rx d/c due to completed course.Please advise.

## 2015-06-14 NOTE — Assessment & Plan Note (Signed)
See if improves with course of prednisone. If not improving consider course of tramadol for pain vs. NSAIDs. Disucssed risk of SE to both.

## 2015-06-14 NOTE — Addendum Note (Signed)
Addended by: Eliezer Lofts E on: 06/14/2015 04:16 PM   Modules accepted: Orders, SmartSet

## 2015-06-14 NOTE — Telephone Encounter (Signed)
Sent!

## 2015-06-14 NOTE — Addendum Note (Signed)
Addended by: Carter Kitten on: 06/14/2015 01:55 PM   Modules accepted: Orders

## 2015-06-14 NOTE — Progress Notes (Signed)
Pre visit review using our clinic review tool, if applicable. No additional management support is needed unless otherwise documented below in the visit note. 

## 2015-06-14 NOTE — Assessment & Plan Note (Signed)
Likely viral trigger.. Treat with prednisone taper. If not improving,plan treat with antibitoics for bacterial infection.

## 2015-06-14 NOTE — Progress Notes (Signed)
   Subjective:    Patient ID: Elizabeth Silva, female    DOB: May 19, 1918, 81 y.o.   MRN: [id number]  HPI  81 year old female with history of CKD, COPD, HTN presents with new onset cough and cold symptoms. She reports that she has 1 week of cough, productive,  No congestion, no sinus pain, no ear pain. She has been more short of breath, some increase in wheezing, initially 100 F. Mucinex DM helped with her cough. No chest pain.  Fatigued.  On spiriva and advair.  2 nights ago had burning with urination at night. Drank a lot of water, improved on its own. No abd. pain, no flank pain.  Some low back pain.   Stiffness and soreness in hands (has deforming OA) worsening, cannot o[pen jars.  Wonders if she can use something stronger.  Using tylenol. In past has seen ORTHO, Dr. Sabra Heck for knee pain.. Tried meloxicam, did not help much.   Review of Systems  Constitutional: Positive for fatigue. Negative for fever.  HENT: Negative for ear pain.   Eyes: Negative for pain.  Cardiovascular: Negative for chest pain and leg swelling.  Gastrointestinal: Negative for abdominal pain.       Objective:   Physical Exam  Constitutional: Vital signs are normal. She appears well-developed and well-nourished. She is cooperative.  Non-toxic appearance. She does not appear ill. No distress.  Elderly ambulatory female with cane  HENT:  Head: Normocephalic.  Right Ear: Hearing, tympanic membrane, external ear and ear canal normal. Tympanic membrane is not erythematous, not retracted and not bulging.  Left Ear: Hearing, tympanic membrane, external ear and ear canal normal. Tympanic membrane is not erythematous, not retracted and not bulging.  Nose: No mucosal edema or rhinorrhea. Right sinus exhibits no maxillary sinus tenderness and no frontal sinus tenderness. Left sinus exhibits no maxillary sinus tenderness and no frontal sinus tenderness.  Mouth/Throat: Uvula is midline, oropharynx is clear and moist  and mucous membranes are normal.  Eyes: Conjunctivae, EOM and lids are normal. Pupils are equal, round, and reactive to light. Lids are everted and swept, no foreign bodies found.  Neck: Trachea normal and normal range of motion. Neck supple. Carotid bruit is not present. No thyroid mass and no thyromegaly present.  Cardiovascular: Normal rate, regular rhythm, S1 normal, S2 normal, normal heart sounds, intact distal pulses and normal pulses.  Exam reveals no gallop and no friction rub.   No murmur heard. Pulmonary/Chest: Effort normal. No tachypnea. No respiratory distress. She has no decreased breath sounds. She has wheezes. She has no rhonchi. She has no rales.  Scattered wheeze with cough  Abdominal: Soft. Normal appearance and bowel sounds are normal. There is no hepatosplenomegaly. There is no tenderness. There is no rigidity, no guarding and no CVA tenderness.  Musculoskeletal:       Lumbar back: She exhibits normal range of motion, no tenderness, no bony tenderness and no swelling.  No current low back pain, neg SLR.  Bilateral hands with deformity, swelling, no redness from OA in PIP, DIP and MCP joints  Neurological: She is alert.  Skin: Skin is warm, dry and intact. No rash noted.  Psychiatric: Her speech is normal and behavior is normal. Judgment and thought content normal. Her mood appears not anxious. Cognition and memory are normal. She does not exhibit a depressed mood.          Assessment & Plan:

## 2015-06-14 NOTE — Patient Instructions (Addendum)
Continue mucinex as needed for cough.  Start a prednisone taper for COPD Exacerbation.  If not improving SOB, cough in 72 hours, or recurrence of fever .Marland Kitchen Call for for antibiotic. Continue to to drink a lot of water.  We will call with urine culture results.  If pain in knees, low back and hands does not improve as expected, call for possible trial of tramadol.

## 2015-06-15 MED ORDER — PREDNISONE 20 MG PO TABS: ORAL_TABLET | ORAL | Status: DC

## 2015-06-15 NOTE — Telephone Encounter (Signed)
Spoke with Abbe Amsterdam at [facility] and gave prednisone rx per instruction; unable to reach pt by phone but spoke with Sharon Seller (per [facility]) and pt is out shopping this morning and Collie Siad will let pt know rx is at [facility]. Collie Siad will also cancel [facility] with [facility].

## 2015-06-17 ENCOUNTER — Other Ambulatory Visit: Payer: Self-pay | Admitting: *Deleted

## 2015-06-17 ENCOUNTER — Telehealth: Payer: Self-pay | Admitting: Family Medicine

## 2015-06-17 LAB — URINE CULTURE: Colony Count: >=100000

## 2015-06-17 MED ORDER — ESTROGENS, CONJUGATED 0.625 MG/GM VA CREA: TOPICAL_CREAM | VAGINAL | Status: DC

## 2015-06-17 MED ORDER — CIPROFLOXACIN HCL 250 MG PO TABS: 250.0000 mg | ORAL_TABLET | Freq: Two times a day (BID) | ORAL | Status: DC

## 2015-06-17 NOTE — Telephone Encounter (Signed)
Sent in antibiotic x 7 days for ecoli. Will check on sensitivities. Pt has tolerated cipro in past.

## 2015-06-21 ENCOUNTER — Ambulatory Visit: Payer: Medicare Other | Admitting: Family Medicine

## 2015-07-12 ENCOUNTER — Other Ambulatory Visit: Payer: Self-pay | Admitting: Family Medicine

## 2015-07-12 NOTE — Telephone Encounter (Signed)
Last office visit 06/14/2015.  Last refilled 04/18/2015 for #90 with no refills.  Ok to refill?

## 2015-07-22 ENCOUNTER — Telehealth: Payer: Self-pay

## 2015-07-22 MED ORDER — TRAMADOL HCL 50 MG PO TABS: 25.0000 mg | ORAL_TABLET | Freq: Three times a day (TID) | ORAL | Status: DC | PRN

## 2015-07-22 NOTE — Telephone Encounter (Signed)
Rx printed and faxed to [facility] at [phone].

## 2015-07-22 NOTE — Telephone Encounter (Signed)
Pt is requesting tramadol to [facility];other meds are not helping pain in hands, knees and back. Pt noted on AVS from 06/14/15 if pain did not get better after taking prednisone to cb for tramadol. Pt request cb.

## 2015-07-22 NOTE — Telephone Encounter (Signed)
Rx sent as requested.

## 2015-07-29 ENCOUNTER — Other Ambulatory Visit: Payer: Self-pay | Admitting: Family Medicine

## 2015-07-29 MED ORDER — TRAMADOL HCL 50 MG PO TABS: 25.0000 mg | ORAL_TABLET | Freq: Three times a day (TID) | ORAL | Status: DC | PRN

## 2015-07-29 NOTE — Telephone Encounter (Signed)
Last office visit 06/14/2015.  No on current medication list.  Refill?

## 2015-08-01 MED ORDER — TRAMADOL HCL 50 MG PO TABS: 25.0000 mg | ORAL_TABLET | Freq: Three times a day (TID) | ORAL | Status: DC | PRN

## 2015-08-01 NOTE — Addendum Note (Signed)
Addended by: Carter Kitten on: 08/01/2015 08:57 AM   Modules accepted: Orders

## 2015-08-01 NOTE — Telephone Encounter (Signed)
Tramadol Rx printed to fax into [facility].   Placed in Dr. Rometta Emery in box for signature.

## 2015-08-02 NOTE — Telephone Encounter (Signed)
Tramadol Rx faxed to [facility] [phone]. 

## 2015-08-12 ENCOUNTER — Ambulatory Visit (INDEPENDENT_AMBULATORY_CARE_PROVIDER_SITE_OTHER)
Admission: RE | Admit: 2015-08-12 | Discharge: 2015-08-12 | Disposition: A | Discharge status: Home / Self Care | Payer: Medicare Other | Source: Ambulatory Visit | Attending: Family Medicine | Admitting: Family Medicine

## 2015-08-12 ENCOUNTER — Ambulatory Visit (INDEPENDENT_AMBULATORY_CARE_PROVIDER_SITE_OTHER): Payer: Medicare Other | Admitting: Family Medicine

## 2015-08-12 ENCOUNTER — Encounter: Payer: Self-pay | Admitting: Family Medicine

## 2015-08-12 VITALS — BP 142/47 | HR 61 | Temp 98.1°F | Ht 61.0 in | Wt 130.0 lb

## 2015-08-12 DIAGNOSIS — Z23 Encounter for immunization: Secondary | ICD-10-CM | POA: Diagnosis not present

## 2015-08-12 DIAGNOSIS — M25561 Pain in right knee: Secondary | ICD-10-CM

## 2015-08-12 DIAGNOSIS — I10 Essential (primary) hypertension: Secondary | ICD-10-CM

## 2015-08-12 DIAGNOSIS — M179 Osteoarthritis of knee, unspecified: Secondary | ICD-10-CM | POA: Diagnosis not present

## 2015-08-12 NOTE — Assessment & Plan Note (Signed)
Eval with X-ray. Likely flare of posterior compartment OA versus new meniscal tear. Tramadol for pain.. Can use BID. ICe, elevate, brace pull on.

## 2015-08-12 NOTE — Progress Notes (Signed)
81 year old female pt With history of HTN, high cholesterol, COPD, CKD diastolic HF, frequent past UTI presetns for follow Up.   New onset pain in right knee. Twisted it 4 weeks ago. Decreased pain over time. Still having pain in posterior knee. No redness, right knee swells more than left regularly. Has used tramadol for pain, helps temporarily.  Occ pops with walking.   Hypertension: tolerable control on bystolic, amlodipine, lasix BP Readings from Last 3 Encounters:  08/12/15 142/47  06/14/15 140/40  04/26/15 140/48  Using medication without problems or lightheadedness:  Chest pain with exertion: None Edema: stable Short of breath:slightly worse lately going up and down steps. Average home BPs: not lately. Other issues:  Wt Readings from Last 3 Encounters:  08/12/15 130 lb (58.968 kg)  06/14/15 128 lb 8 oz (58.287 kg)  04/26/15 127 lb (57.607 kg)   Hx of carotid stenosis and high cholesterol, pt treating aggressively with statin. Per Dr. Donivan Scull last note.  LDL  Almost at goal < 70 last check in 01/2015.   Lab Results  Component Value Date   CHOL 162 01/14/2015   HDL 59.80 01/14/2015   LDLCALC 72 01/14/2015   LDLDIRECT 122.1 08/17/2010   TRIG 150.0* 01/14/2015   CHOLHDL 3 01/14/2015    Review of Systems  Constitutional: Positive for fatigue. Negative for fever.  HENT: Negative for ear pain.  Eyes: Negative for pain.  Respiratory: Positive for shortness of breath. Negative for chest tightness.   Stable  Cardiovascular: Positive for leg swelling. Negative for chest pain and palpitations.  Gastrointestinal: Negative for abdominal pain.  Genitourinary: Negative for dysuria.  Neurological: Negative for light-headedness.       Objective:   Physical Exam  Constitutional: She is oriented to person, place, and time. Vital signs are normal. She appears well-developed and well-nourished. She is cooperative. Non-toxic appearance. She does not appear ill.  No distress.  Elderly female in NAD  HENT:  Head: Normocephalic.  Right Ear: Hearing, tympanic membrane, external ear and ear canal normal. Tympanic membrane is not erythematous, not retracted and not bulging.  Left Ear: Hearing, tympanic membrane, external ear and ear canal normal. Tympanic membrane is not erythematous, not retracted and not bulging.  Nose: No mucosal edema or rhinorrhea. Right sinus exhibits no maxillary sinus tenderness and no frontal sinus tenderness. Left sinus exhibits no maxillary sinus tenderness and no frontal sinus tenderness.  Mouth/Throat: Uvula is midline, oropharynx is clear and moist and mucous membranes are normal.  Eyes: Conjunctivae, EOM and lids are normal. Pupils are equal, round, and reactive to light. Lids are everted and swept, no foreign bodies found.  Neck: Trachea normal and normal range of motion. Neck supple. Carotid bruit is not present. No thyroid mass and no thyromegaly present.  Cardiovascular: Normal rate, regular rhythm, S1 normal, S2 normal, normal heart sounds, intact distal pulses and normal pulses. Exam reveals no gallop and no friction rub.  No murmur heard. Pulmonary/Chest: Effort normal and breath sounds normal. No tachypnea. No respiratory distress. She has no decreased breath sounds. She has no wheezes. She has no rhonchi. She has no rales.  Abdominal: Soft. Normal appearance and bowel sounds are normal. There is no hepatosplenomegaly. There is no tenderness. There is no CVA tenderness. No hernia.   MSK: swelling and deformity in right knee, no redness,  There is increased warmth, ttp in right posterior knee, mildly positive MCMurray's, neg ant and post drawer, no ttp over bilateral lateral ligaments.  Skin: Skin  is warm, dry and intact. No rash noted.  Psychiatric: She has a normal mood and affect. Her speech is normal and behavior is normal. Judgment and thought content normal. Her mood appears not anxious. Cognition and memory are  normal. Cognition and memory are not impaired. She does not exhibit a depressed mood. She exhibits normal recent memory and normal remote memory.

## 2015-08-12 NOTE — Progress Notes (Signed)
Pre visit review using our clinic review tool, if applicable. No additional management support is needed unless otherwise documented below in the visit note. 

## 2015-08-12 NOTE — Assessment & Plan Note (Signed)
Well controlled. Continue current medication.  

## 2015-08-12 NOTE — Patient Instructions (Signed)
Limit stairs, and bending knee.   We will call with X-ray results.  Use tramadol 1-2 times a day for pain.  Elevate leg, ice  knee daily. Call if not improving for further evaluation.

## 2015-08-17 ENCOUNTER — Ambulatory Visit: Payer: Medicare Other | Admitting: Family Medicine

## 2015-08-22 ENCOUNTER — Ambulatory Visit (INDEPENDENT_AMBULATORY_CARE_PROVIDER_SITE_OTHER): Payer: Medicare Other | Admitting: Family Medicine

## 2015-08-22 ENCOUNTER — Encounter: Payer: Self-pay | Admitting: Family Medicine

## 2015-08-22 VITALS — BP 152/49 | HR 66 | Temp 97.6°F | Ht 61.0 in | Wt 129.8 lb

## 2015-08-22 DIAGNOSIS — M25561 Pain in right knee: Secondary | ICD-10-CM

## 2015-08-22 DIAGNOSIS — N184 Chronic kidney disease, stage 4 (severe): Secondary | ICD-10-CM

## 2015-08-22 MED ORDER — METHYLPREDNISOLONE ACETATE 40 MG/ML IJ SUSP
80.0000 mg | Freq: Once | INTRAMUSCULAR | Status: AC
Start: 2015-08-22 — End: 2015-08-22
  Administered 2015-08-22: 80 mg via INTRA_ARTICULAR

## 2015-08-22 NOTE — Progress Notes (Signed)
Pre visit review using our clinic review tool, if applicable. No additional management support is needed unless otherwise documented below in the visit note. 

## 2015-08-22 NOTE — Progress Notes (Signed)
Dr. Frederico Hamman T. Nivia Gervase, MD, [facility] [place], [address] Phone: [phone] Fax: [phone]  08/22/2015  Patient: Elizabeth Silva, MRN: [id number], DOB: May 19, 1918, 81 y.o.  Primary Physician:  Eliezer Lofts, MD   Chief Complaint  Patient presents with  . Knee Pain    Right   Subjective:   Elizabeth Silva is a 81 y.o. very pleasant female patient who presents with the following:  R knee pain:   Twisted a little bit. About four all the time. About the same. Still some baseline pain. Getting worse over time. This is been in a timeframe for about 2 weeks or so.  Prior to then, she also has knee pain baseline, now is worse compared to before.  She has a mild effusion, but she does not have any frank bruising.  Past Medical History, Surgical History, Social History, Family History, Problem List, Medications, and Allergies have been reviewed and updated if relevant.  Patient Active Problem List   Diagnosis Date Noted  . Acute pain of right knee 08/12/2015  . Auditory hallucination 01/14/2015  . Vision loss, bilateral 10/26/2014  . Carotid stenosis 10/26/2014  . Bilateral tinnitus 04/20/2014  . Prediabetes 01/07/2014  . Bilateral knee pain 01/07/2014  . Hip pain, left 01/07/2014  . COPD exacerbation ([place]) 10/14/2013  . Fatigue 08/28/2013  . Insomnia 01/02/2013  . Ganglion cyst 08/12/2012  . Abnormal chest CT 07/18/2012  . Anemia of chronic renal failure 12/25/2010  . CONSTIPATION 12/08/2010  . DIASTOLIC HEART FAILURE, CHRONIC 03/02/2010  . CAROTID BRUIT, RIGHT 03/02/2010  . INCONTINENCE, URGE 01/25/2010  . PERIPHERAL NEUROPATHY, IDIOPATHIC 05/10/2008  . DYSURIA, CHRONIC 02/27/2008  . PERIPHERAL VASCULAR DISEASE 05/05/2007  . Chronic venous insufficiency 05/05/2007  . ALLERGIC RHINITIS 05/05/2007  . COPD 05/05/2007  . GERD 05/05/2007  . DIVERTICULOSIS, COLON 05/05/2007  . Osteoarthritis, multiple sites  05/05/2007  . DEGENERATION, CERVICAL DISC 05/05/2007  . STRESS INCONTINENCE 05/05/2007  . HYPERCHOLESTEROLEMIA, PURE 04/07/2007  . RENAL STONE 04/07/2007  . Essential hypertension, benign 03/17/2007  . Chronic kidney disease, stage 4, severely decreased GFR ([facility]) 03/17/2007  . CARDIAC MURMUR 03/17/2007    Past Medical History  Diagnosis Date  . Allergic rhinitis   . COPD (chronic obstructive pulmonary disease) ([facility])   . Diverticulosis of colon   . GERD (gastroesophageal reflux disease)   . Hyperlipidemia   . Hypertension     LE swelling with amlodipine, intolerant of ACEIs  . Osteoarthritis   . CKD (chronic kidney disease)   . Impaired vision     right eye, was told due to HTN  . LBBB (left bundle branch block)   . Diastolic CHF, acute (HCC)     echo (4/11) with EF 60-65%,mild LVH, mild aortic insufficiency, mild MR, PA systolic pressure 93-26 mmHg, mild to moderate TR  . History of PFTs   . H/O Clostridium difficile infection Sept. 2013    Past Surgical History  Procedure Laterality Date  . Cardiac catheterization  1999    normal  . Total abdominal hysterectomy  1970    suspicious cells  . Cholecystectomy  1980  . Cervical disc surgery  2005    cerv. disc, dz. C5-C7, facet arthritis    Social History   Social History  . Marital Status: Married    Spouse Name: N/A  . Number of Children: N/A  . Years of Education: N/A   Occupational History  . Not on  file.   Social History Main Topics  . Smoking status: Former Smoker -- 1.00 packs/day    Types: Cigarettes    Quit date: 10/15/1984  . Smokeless tobacco: Never Used  . Alcohol Use: Yes  . Drug Use: No  . Sexual Activity: Not on file   Other Topics Concern  . Not on file   Social History Narrative   No living will, HCPOA: daughter Wilburn Mylar    Family History  Problem Relation Age of Onset  . Heart block Mother     complete  . Dementia Father   . Colon cancer Sister   . Dementia Sister   . Lung  cancer Brother   . Cancer Brother     sinus  . Diabetes Brother     Allergies  Allergen Reactions  . Levofloxacin     REACTION: Burning in legs  . Penicillins     REACTION: Rash    Medication list reviewed and updated in full in [facility].  GEN: No fevers, chills. Nontoxic. Primarily MSK c/o today. MSK: Detailed in the HPI GI: tolerating PO intake without difficulty Neuro: No numbness, parasthesias, or tingling associated. Otherwise the pertinent positives of the ROS are noted above.   Objective:   BP 152/49 mmHg  Pulse 66  Temp(Src) 97.6 F (36.4 C) (Oral)  Ht '5\' 1"'$  (1.549 m)  Wt 129 lb 12 oz (58.854 kg)  BMI 24.53 kg/m2   GEN: WDWN, NAD, Non-toxic, Alert & Oriented x 3 HEENT: Atraumatic, Normocephalic.  Ears and Nose: No external deformity. EXTR: No clubbing/cyanosis/edema NEURO: Normal gait.  PSYCH: Normally interactive. Conversant. Not depressed or anxious appearing.  Calm demeanor.   Knee:  R Gait: Normal heel toe pattern, antalgia ROM: 0-115 Effusion: mild Echymosis or edema: none Patellar tendon NT Painful PLICA: neg Patellar grind: negative Medial and lateral patellar facet loading: negative medial and lateral joint lines: mild ttp Mcmurray's neg Flexion-pinch pos Varus and valgus stress: stable Lachman: neg Ant and Post drawer: neg Hip abduction, IR, ER: WNL Hip flexion str: 5/5 Hip abd: 5/5 Quad: 5/5 VMO atrophy: mild Hamstring concentric and eccentric: 5/5   Radiology: Dg Knee Ap/lat W/sunrise Right  08/12/2015  CLINICAL DATA:  Pain in posterior RIGHT knee EXAM: RIGHT KNEE 3 VIEWS COMPARISON:  None. FINDINGS: Joint space narrowing of the medial and lateral compartment with associated sclerosis. No significant osteophytosis. There is osteophytosis of the superior aspect of the patella. No joint effusion. No fracture IMPRESSION: Tricompartmental osteoarthritis without acute findings. Electronically Signed   By: Suzy Bouchard M.D.    On: 08/12/2015 14:32    Assessment and Plan:   Right knee pain - Plan: methylPREDNISolone acetate (DEPO-MEDROL) injection 80 mg  Chronic kidney disease, stage 4, severely decreased GFR (HCC)   Tricompartmental arthritis is noted, but not advance her age, given that she is 81 years old.  More likely pure arthritis exacerbation,, but cannot exclude degenerative meniscal tear also.  In a patient with renal disease, we will do an intra-articular corticosteroid injection to try to calm down the patient's symptoms and her arthritis exacerbation.  Knee Injection, R Patient verbally consented to procedure. Risks (including potential rare risk of infection), benefits, and alternatives explained. Sterilely prepped with Chloraprep. Ethyl cholride used for anesthesia. 8 cc Lidocaine 1% mixed with 2 mL Depo-Medrol 40 mg injected using the anteromedial approach without difficulty. No complications with procedure and tolerated well. Patient had decreased pain post-injection.   Follow-up: No Follow-up on file.  Signed,  Miaya Lafontant T. Ahmari Garton, MD   Patient's Medications  New Prescriptions   No medications on file  Previous Medications   ACETAMINOPHEN (TYLENOL) 500 MG TABLET    Take 500 mg by mouth every 6 (six) hours as needed.     ADVAIR DISKUS 250-50 MCG/DOSE AEPB    Inhale 1 puff into the  lungs two times daily   ALBUTEROL (PROAIR HFA) 108 (90 BASE) MCG/ACT INHALER    Inhale 2 puffs into the lungs every 6 (six) hours as needed for wheezing or shortness of breath.   AMLODIPINE (NORVASC) 5 MG TABLET    Take 1 tablet by mouth two  times daily   ASPIRIN 81 MG EC TABLET    Take 81 mg by mouth daily.     CONJUGATED ESTROGENS (PREMARIN) VAGINAL CREAM    Use 1 applicatorful 2 to 3 times per week.   FERROUS FUMARATE (HEMOCYTE - 106 MG FE) 325 (106 FE) MG TABS TABLET    Take 1 tablet (106 mg of iron total) by mouth daily.   FLUTICASONE (FLONASE) 50 MCG/ACT NASAL SPRAY    Place 2 sprays into both nostrils  daily.   FUROSEMIDE (LASIX) 20 MG TABLET    Take 1 tablet (20 mg total) by mouth daily as needed.   GABAPENTIN (NEURONTIN) 600 MG TABLET    Take 1 tablet by mouth at  bedtime   HYDRALAZINE (APRESOLINE) 100 MG TABLET    Take 1 tablet by mouth 3  times a day   LOVASTATIN (MEVACOR) 20 MG TABLET    Take 1 tablet (20 mg total) by mouth at bedtime.   NEBIVOLOL (BYSTOLIC) 10 MG TABLET    Take 1 tablet by mouth  daily   OMEPRAZOLE (PRILOSEC) 40 MG CAPSULE    Take 1 capsule by mouth  daily   OXYBUTYNIN (DITROPAN) 5 MG TABLET    Take 1 tablet by mouth two  times daily   PROBIOTIC PRODUCT (PROBIOTIC DAILY PO)    Take 1 capsule by mouth daily.   TIOTROPIUM (SPIRIVA HANDIHALER) 18 MCG INHALATION CAPSULE    Inhale the contents of 1  capsule via HandiHaler  daily   TRAMADOL (ULTRAM) 50 MG TABLET    Take 0.5-1 tablets (25-50 mg total) by mouth every 8 (eight) hours as needed for moderate pain.   VITAMIN E 1000 UNIT CAPSULE    Take 1,000 Units by mouth daily.    Modified Medications   No medications on file  Discontinued Medications   No medications on file

## 2015-09-07 ENCOUNTER — Encounter: Payer: Self-pay | Admitting: Family Medicine

## 2015-09-07 ENCOUNTER — Ambulatory Visit (INDEPENDENT_AMBULATORY_CARE_PROVIDER_SITE_OTHER): Payer: Medicare Other | Admitting: Family Medicine

## 2015-09-07 DIAGNOSIS — R300 Dysuria: Secondary | ICD-10-CM

## 2015-09-07 LAB — POCT URINALYSIS DIPSTICK
BILIRUBIN UA: NEGATIVE
GLUCOSE UA: NEGATIVE
Ketones, UA: NEGATIVE
Nitrite, UA: NEGATIVE
Protein, UA: 100
RBC UA: NEGATIVE
SPEC GRAV UA: 1.02
UROBILINOGEN UA: 0.2
pH, UA: 5

## 2015-09-07 MED ORDER — CEPHALEXIN 250 MG PO CAPS: 250.0000 mg | ORAL_CAPSULE | Freq: Two times a day (BID) | ORAL | Status: DC

## 2015-09-07 NOTE — Progress Notes (Signed)
Subjective:  Patient ID: Elizabeth Silva, female    DOB: 06-25-1918  Age: 81 y.o. MRN: [id number]  CC: ? UTI  HPI:  81 year old female with a history of frequent UTI/chronic dysuria presents with complaints of burning with urination.  Patient reports that she's been expansion burning with urination since last night. She's also noticed some frequency. No associated fever. She has had some abdominal pain but attributes this to recent constipation. No flank pain. No fevers or chills. Upon review of the EMR it does appear the patient has frequent UTIs.  Social Hx   Social History   Social History  . Marital Status: Married    Spouse Name: N/A  . Number of Children: N/A  . Years of Education: N/A   Social History Main Topics  . Smoking status: Former Smoker -- 1.00 packs/day    Types: Cigarettes    Quit date: 10/15/1984  . Smokeless tobacco: Never Used  . Alcohol Use: Yes  . Drug Use: No  . Sexual Activity: Not Asked   Other Topics Concern  . None   Social History Narrative   No living will, HCPOA: daughter Wilburn Mylar   Review of Systems  Constitutional: Negative for fever.  Genitourinary: Positive for dysuria and frequency. Negative for flank pain.   Objective:  BP 140/56 mmHg  Pulse 67  Temp(Src) 98.3 F (36.8 C) (Oral)  Ht '5\' 1"'$  (1.549 m)  Wt 125 lb 4 oz (56.813 kg)  BMI 23.68 kg/m2  SpO2 97%  BP/Weight 09/07/2015 08/22/2015 50/53/9767  Systolic BP 341 937 902  Diastolic BP 56 49 47  Wt. (Lbs) 125.25 129.75 130  BMI 23.68 24.53 24.58   Physical Exam  Constitutional: She appears well-developed. No distress.  Cardiovascular: Normal rate and regular rhythm.   Pulmonary/Chest: Effort normal.  Abdominal: Soft. She exhibits no distension. There is no tenderness. There is no rebound and no guarding.  Neurological: She is alert.  Psychiatric: She has a normal mood and affect.  Vitals reviewed.  Lab Results  Component Value Date   WBC 8.8 01/14/2015   HGB 11.3*  01/14/2015   HCT 34.5* 01/14/2015   PLT 225.0 01/14/2015   GLUCOSE 153* 01/14/2015   CHOL 162 01/14/2015   TRIG 150.0* 01/14/2015   HDL 59.80 01/14/2015   LDLDIRECT 122.1 08/17/2010   LDLCALC 72 01/14/2015   ALT 11 01/14/2015   AST 19 01/14/2015   NA 139 01/14/2015   K 4.5 01/14/2015   CL 110 01/14/2015   CREATININE 1.67* 01/14/2015   BUN 52* 01/14/2015   CO2 23 01/14/2015   TSH 1.74 08/28/2013   HGBA1C 5.4 01/14/2015   Results for orders placed or performed in visit on 09/07/15 (from the past 24 hour(s))  POCT Urinalysis Dipstick     Status: Abnormal   Collection Time: 09/07/15  2:11 PM  Result Value Ref Range   Color, UA dark yellow    Clarity, UA clear    Glucose, UA neg    Bilirubin, UA neg    Ketones, UA neg    Spec Grav, UA 1.020    Blood, UA neg    pH, UA 5.0    Protein, UA 100    Urobilinogen, UA 0.2    Nitrite, UA neg    Leukocytes, UA Trace (A) Negative    Assessment & Plan:   Problem List Items Addressed This Visit    DYSURIA, CHRONIC    Patient with symptoms of UTI. Urine only remarkable for trace  leukocytes. Given age, comorbidities, and history of frequent UTIs I have elected to empirically treat her with antibiotics while awaiting culture. Rx for Keflex sent (this was renally dosed).       Relevant Orders   POCT Urinalysis Dipstick (Completed)   Urine culture      Meds ordered this encounter  Medications  . cephALEXin (KEFLEX) 250 MG capsule    Sig: Take 1 capsule (250 mg total) by mouth 2 (two) times daily.    Dispense:  14 capsule    Refill:  0    Follow-up: Return if symptoms worsen or fail to improve.  [place]

## 2015-09-07 NOTE — Patient Instructions (Signed)
It was nice to see you today.  Take the antibiotic (Keflex) twice daily for 1 week.  Follow up as needed or if you worsen.  Take care  Dr. Lacinda Axon

## 2015-09-07 NOTE — Progress Notes (Signed)
Pre visit review using our clinic review tool, if applicable. No additional management support is needed unless otherwise documented below in the visit note. 

## 2015-09-07 NOTE — Assessment & Plan Note (Signed)
Patient with symptoms of UTI. Urine only remarkable for trace leukocytes. Given age, comorbidities, and history of frequent UTIs I have elected to empirically treat her with antibiotics while awaiting culture. Rx for Keflex sent (this was renally dosed).

## 2015-09-09 LAB — URINE CULTURE

## 2015-10-04 ENCOUNTER — Encounter: Payer: Self-pay | Admitting: Family Medicine

## 2015-10-04 ENCOUNTER — Telehealth: Payer: Self-pay | Admitting: Family Medicine

## 2015-10-04 ENCOUNTER — Ambulatory Visit (INDEPENDENT_AMBULATORY_CARE_PROVIDER_SITE_OTHER)
Admission: RE | Admit: 2015-10-04 | Discharge: 2015-10-04 | Disposition: A | Discharge status: Home / Self Care | Payer: Medicare Other | Source: Ambulatory Visit | Attending: Family Medicine | Admitting: Family Medicine

## 2015-10-04 ENCOUNTER — Ambulatory Visit: Payer: Medicare Other | Admitting: Family Medicine

## 2015-10-04 ENCOUNTER — Ambulatory Visit (INDEPENDENT_AMBULATORY_CARE_PROVIDER_SITE_OTHER): Payer: Medicare Other | Admitting: Family Medicine

## 2015-10-04 VITALS — BP 140/40 | HR 76 | Temp 97.9°F | Wt 130.0 lb

## 2015-10-04 DIAGNOSIS — R0602 Shortness of breath: Secondary | ICD-10-CM

## 2015-10-04 DIAGNOSIS — R0789 Other chest pain: Secondary | ICD-10-CM | POA: Diagnosis not present

## 2015-10-04 DIAGNOSIS — S81802A Unspecified open wound, left lower leg, initial encounter: Secondary | ICD-10-CM

## 2015-10-04 DIAGNOSIS — J449 Chronic obstructive pulmonary disease, unspecified: Secondary | ICD-10-CM | POA: Diagnosis not present

## 2015-10-04 DIAGNOSIS — S81812A Laceration without foreign body, left lower leg, initial encounter: Secondary | ICD-10-CM | POA: Insufficient documentation

## 2015-10-04 LAB — CBC WITH DIFFERENTIAL/PLATELET
BASOS PCT: 1.3 % (ref 0.0–3.0)
Basophils Absolute: 0.1 10*3/uL (ref 0.0–0.1)
EOS PCT: 3.1 % (ref 0.0–5.0)
Eosinophils Absolute: 0.3 10*3/uL (ref 0.0–0.7)
HEMATOCRIT: 33.2 % — AB (ref 36.0–46.0)
HEMOGLOBIN: 10.7 g/dL — AB (ref 12.0–15.0)
Lymphocytes Relative: 17.9 % (ref 12.0–46.0)
Lymphs Abs: 1.8 10*3/uL (ref 0.7–4.0)
MCHC: 32.3 g/dL (ref 30.0–36.0)
MCV: 89.9 fl (ref 78.0–100.0)
MONOS PCT: 10.7 % (ref 3.0–12.0)
Monocytes Absolute: 1.1 10*3/uL — ABNORMAL HIGH (ref 0.1–1.0)
Neutro Abs: 6.8 10*3/uL (ref 1.4–7.7)
Neutrophils Relative %: 67 % (ref 43.0–77.0)
Platelets: 362 10*3/uL (ref 150.0–400.0)
RBC: 3.69 Mil/uL — AB (ref 3.87–5.11)
RDW: 14.9 % (ref 11.5–15.5)
WBC: 10.2 10*3/uL (ref 4.0–10.5)

## 2015-10-04 LAB — BASIC METABOLIC PANEL WITH GFR
BUN: 59 mg/dL — ABNORMAL HIGH (ref 6–23)
CO2: 23 meq/L (ref 19–32)
Calcium: 9.7 mg/dL (ref 8.4–10.5)
Chloride: 106 meq/L (ref 96–112)
Creatinine, Ser: 1.87 mg/dL — ABNORMAL HIGH (ref 0.40–1.20)
GFR: 26.54 mL/min — ABNORMAL LOW (ref >60.00)
Glucose, Bld: 150 mg/dL — ABNORMAL HIGH (ref 70–99)
Potassium: 4.2 meq/L (ref 3.5–5.1)
Sodium: 140 meq/L (ref 135–145)

## 2015-10-04 LAB — TROPONIN I: TNIDX: 0.01 ug/L (ref 0.00–0.06)

## 2015-10-04 LAB — BRAIN NATRIURETIC PEPTIDE: Pro B Natriuretic peptide (BNP): 561 pg/mL — ABNORMAL HIGH (ref 0.0–100.0)

## 2015-10-04 NOTE — Progress Notes (Signed)
BP 140/40 mmHg  Pulse 76  Temp(Src) 97.9 F (36.6 C) (Oral)  Wt 130 lb (58.968 kg)  SpO2 96%   CC: chest tightness  Subjective:    Patient ID: Elizabeth Silva, female    DOB: 1917/12/17, 81 y.o.   MRN: [id number]  HPI: Elizabeth Silva is a 81 y.o. female presenting on 10/04/2015 for Shortness of Breath   Presents with daughter. Over the last 4 days noticing chest tightness associated with dyspnea. When this started she also noticed increased cough with increased mucous production. Mild lightheadedness. Noticed leg swelling over the past few days - has taken PRN lasix for last 2 days. Worse with exertion. Better with rest.   Denies fevers/chills, ear or tooth pain, chest pain, palpitations. No wheezing or dizziness. No significant congestion, sneezing, ST or PNDrainage.   Known COPD, CKD, diastolic CHF. Complaint with advair 250/50 1 puff BID and spiriva nightly. No recent albuterol use.  Has not been around smokers.  She did receive keflex course for UTI last month, this resolved her symptoms.  Normal cardiac catheterization 1999.  Last echo 2012 - nl EF, normal wall motion.   Relevant past medical, surgical, family and social history reviewed and updated as indicated. Interim medical history since our last visit reviewed. Allergies and medications reviewed and updated. Current Outpatient Prescriptions on File Prior to Visit  Medication Sig  . acetaminophen (TYLENOL) 500 MG tablet Take 500 mg by mouth every 6 (six) hours as needed.    Marland Kitchen ADVAIR DISKUS 250-50 MCG/DOSE AEPB Inhale 1 puff into the  lungs two times daily  . albuterol (PROAIR HFA) 108 (90 BASE) MCG/ACT inhaler Inhale 2 puffs into the lungs every 6 (six) hours as needed for wheezing or shortness of breath.  Marland Kitchen amLODipine (NORVASC) 5 MG tablet Take 1 tablet by mouth two  times daily  . aspirin 81 MG EC tablet Take 81 mg by mouth daily.    Marland Kitchen conjugated estrogens (PREMARIN) vaginal cream Use 1 applicatorful 2 to 3 times per  week.  . ferrous fumarate (HEMOCYTE - 106 MG FE) 325 (106 FE) MG TABS tablet Take 1 tablet (106 mg of iron total) by mouth daily.  . fluticasone (FLONASE) 50 MCG/ACT nasal spray Place 2 sprays into both nostrils daily.  . furosemide (LASIX) 20 MG tablet Take 1 tablet (20 mg total) by mouth daily as needed.  . gabapentin (NEURONTIN) 600 MG tablet Take 1 tablet by mouth at  bedtime  . hydrALAZINE (APRESOLINE) 100 MG tablet Take 1 tablet by mouth 3  times a day  . lovastatin (MEVACOR) 20 MG tablet Take 1 tablet (20 mg total) by mouth at bedtime.  . nebivolol (BYSTOLIC) 10 MG tablet Take 1 tablet by mouth  daily  . omeprazole (PRILOSEC) 40 MG capsule Take 1 capsule by mouth  daily  . oxybutynin (DITROPAN) 5 MG tablet Take 1 tablet by mouth two  times daily  . Probiotic Product (PROBIOTIC DAILY PO) Take 1 capsule by mouth daily.  Marland Kitchen tiotropium (SPIRIVA HANDIHALER) 18 MCG inhalation capsule Inhale the contents of 1  capsule via HandiHaler  daily  . traMADol (ULTRAM) 50 MG tablet Take 0.5-1 tablets (25-50 mg total) by mouth every 8 (eight) hours as needed for moderate pain.  . vitamin E 1000 UNIT capsule Take 1,000 Units by mouth daily.     No current facility-administered medications on file prior to visit.    Review of Systems Per HPI unless specifically indicated in ROS section  Objective:    BP 140/40 mmHg  Pulse 76  Temp(Src) 97.9 F (36.6 C) (Oral)  Wt 130 lb (58.968 kg)  SpO2 96%  Wt Readings from Last 3 Encounters:  10/04/15 130 lb (58.968 kg)  09/07/15 125 lb 4 oz (56.813 kg)  08/22/15 129 lb 12 oz (58.854 kg)    Physical Exam  Constitutional: She appears well-developed and well-nourished. No distress.  HENT:  Head: Normocephalic and atraumatic.  Right Ear: External ear and ear canal normal.  Left Ear: External ear and ear canal normal.  Nose: No mucosal edema or rhinorrhea. Right sinus exhibits no maxillary sinus tenderness and no frontal sinus tenderness. Left sinus  exhibits no maxillary sinus tenderness and no frontal sinus tenderness.  Mouth/Throat: Uvula is midline, oropharynx is clear and moist and mucous membranes are normal. No oropharyngeal exudate, posterior oropharyngeal edema, posterior oropharyngeal erythema or tonsillar abscesses.  Hearing aides in place Mild nasal mucosal inflammation  Eyes: Conjunctivae and EOM are normal. Pupils are equal, round, and reactive to light. No scleral icterus.  Neck: Normal range of motion. Neck supple.  Cardiovascular: Normal rate, regular rhythm and intact distal pulses.   Murmur (3/6 systolic murmur) heard. Pulmonary/Chest: Effort normal. No respiratory distress. She has no wheezes. She has rhonchi (faint RLL). She has no rales.  Mildly coarse  Musculoskeletal: She exhibits edema (1-2+ pitting edema BLE).  L anterior shin with 3 cm diameter wound skin abrasion without surrounding erythema dressed with abx ointment, adherent dressing, kerlex and coban.   Lymphadenopathy:    She has no cervical adenopathy.  Skin: Skin is warm and dry. No rash noted.  Nursing note and vitals reviewed.     Assessment & Plan:   Problem List Items Addressed This Visit    Skin tear of left lower leg without complication    No infection today. Dressed with abx ointment. Wound care discussed      Chest tightness - Primary    Exam not consistent with COPD exacerbation.  Anticipate mild CHF flare with worsened pedal edema noted today.  Will check labwork including CBC, BMP, BNP, TnI and CXR today along with EKG.  Treat with prednisone course she has at home as well as scheduled lasix for next 3 days to complete 5 days of lasix.  F/u in 3d with PCP for recheck. Pt and daughter agree with plan.  EKG - LBBB, overall unchanged from prior EKG      Relevant Orders   CBC with Differential/Platelet   Basic metabolic panel   Brain natriuretic peptide   DG Chest 2 View   Troponin I    Other Visit Diagnoses    SOB (shortness of  breath)        Relevant Orders    EKG 12-Lead (Completed)        Follow up plan: Return in about 3 days (around 10/07/2015), or if symptoms worsen or fail to improve, for follow up visit.

## 2015-10-04 NOTE — Telephone Encounter (Signed)
°  °  °     °    °  °  °   ° °  Patient Name: OLIVER NEUWIRTH  DOB: 1918/01/02    Initial Comment Caller states mother is having chest tightness;    Nurse Assessment  Nurse: Orvan Seen, RN, Jacquilin Date/Time (Eastern Time): 10/04/2015 9:30:28 AM  Confirm and document reason for call. If symptomatic, describe symptoms. ---Caller states mother is having chest tightness- Caller is not with the patient but states she doesn't think it is to bad because she is out shopping right now.  Has the patient traveled out of the country within the last 30 days? ---Not Applicable  Does the patient have any new or worsening symptoms? ---Yes  Will a triage be completed? ---Yes  Related visit to physician within the last 2 weeks? ---No  Does the PT have any chronic conditions? (i.e. diabetes, asthma, etc.) ---Yes  Is this a behavioral health or substance abuse call? ---No     Guidelines    Guideline Title Affirmed Question Affirmed Notes  Chest Pain [1] Chest pain lasts > 5 minutes AND [2] described as crushing, pressure-like, or heavy    Final Disposition User   Call EMS 911 Now Orvan Seen, RN, Jacquilin    Comments  Caller denies wanting to call EMS- Caller states she just wants to make a appointment. Spoke with Rena in the office she states she will talk with the daughter. Warm transfer to office.   Referrals  GO TO FACILITY REFUSED   Disagree/Comply: Disagree  Disagree/Comply Reason: Disagree with instructions

## 2015-10-04 NOTE — Telephone Encounter (Signed)
Pts daughter Collie Siad [facility] signed) said pt just started with chest tightness; pts mom is shopping this morning with no cell phone and Collie Siad request afternoon appt. No more SOB than usual. No wheezing, cough or fever. Pt has hx of emphysema and COPD. Collie Siad does not think pt should go to ED. Dr Darnell Level said OK to schedule 30 min appt today at 2:15.Advised if pt condition changes or worsens to take pt to ED prior to appt. Collie Siad voiced understanding.

## 2015-10-04 NOTE — Patient Instructions (Addendum)
Xray and EKG today. I think this may be CHF flare up due to swelling in the legs.  Treat with lasix '20mg'$  daily for next 3 days for leg swelling. Labs today.  You may also start prednisone course you have at home for possible inflammation in lungs. Return in 3 days for follow up with Dr Diona Browner, sooner if worsening. Watch for fever >101 or worsening cough and let us know sooner if that happens.

## 2015-10-04 NOTE — Assessment & Plan Note (Signed)
No infection today. Dressed with abx ointment. Wound care discussed

## 2015-10-04 NOTE — Telephone Encounter (Signed)
To see today

## 2015-10-04 NOTE — Assessment & Plan Note (Addendum)
Exam not consistent with COPD exacerbation.  Anticipate mild CHF flare with worsened pedal edema noted today.  Will check labwork including CBC, BMP, BNP, TnI and CXR today along with EKG.  Treat with prednisone course she has at home as well as scheduled lasix for next 3 days to complete 5 days of lasix.  F/u in 3d with PCP for recheck. Pt and daughter agree with plan.  EKG - LBBB, overall unchanged from prior EKG

## 2015-10-05 ENCOUNTER — Other Ambulatory Visit: Payer: Self-pay | Admitting: Family Medicine

## 2015-10-05 MED ORDER — DOXYCYCLINE HYCLATE 100 MG PO CAPS: 100.0000 mg | ORAL_CAPSULE | Freq: Two times a day (BID) | ORAL | Status: DC

## 2015-10-07 ENCOUNTER — Encounter: Payer: Self-pay | Admitting: Family Medicine

## 2015-10-07 ENCOUNTER — Ambulatory Visit (INDEPENDENT_AMBULATORY_CARE_PROVIDER_SITE_OTHER): Payer: Medicare Other | Admitting: Family Medicine

## 2015-10-07 VITALS — BP 134/40 | HR 64 | Temp 97.6°F | Wt 127.0 lb

## 2015-10-07 DIAGNOSIS — R0789 Other chest pain: Secondary | ICD-10-CM

## 2015-10-07 DIAGNOSIS — J441 Chronic obstructive pulmonary disease with (acute) exacerbation: Secondary | ICD-10-CM

## 2015-10-07 DIAGNOSIS — S81802A Unspecified open wound, left lower leg, initial encounter: Secondary | ICD-10-CM | POA: Diagnosis not present

## 2015-10-07 DIAGNOSIS — J189 Pneumonia, unspecified organism: Secondary | ICD-10-CM | POA: Diagnosis not present

## 2015-10-07 DIAGNOSIS — S81812A Laceration without foreign body, left lower leg, initial encounter: Secondary | ICD-10-CM

## 2015-10-07 NOTE — Progress Notes (Signed)
Pre visit review using our clinic review tool, if applicable. No additional management support is needed unless otherwise documented below in the visit note. 

## 2015-10-07 NOTE — Patient Instructions (Addendum)
Take one more day of lasix then go back to as needed.  Complete antibiotics and prednisone taper.  Call if not improving as expected or increase in shortness of breath.  Keep area on left leg clean and dry.

## 2015-10-07 NOTE — Progress Notes (Signed)
Subjective:    Patient ID: Elizabeth Silva, female    DOB: 05/24/1918, 81 y.o.   MRN: [id number]  HPI 81 year old female presents for follow up on CXR diagnosed CA PNA.  She was seen by Dr. Darnell Level on  12/20 with chest tightness. Felt fluid overloaded  She had cbc: nml wbc, slight decrease in anemia, Hg 10.7  BNP elevated at 561.Marland Kitchen Started on lasix daily x 5 days. Cr stable CKD.  troponin I nml.  CXR: IMPRESSION: COPD. Mild cardiomegaly. Right middle lobe airspace opacity with small right effusion. Findings concerning for pneumonia.  Started on  prednisone taper, doxycycline 100 mg BID.  Today she reports: She continues to feel tired. Chest tightness is better, cough better, no fever. Swelling in legs better.   Wt Readings from Last 3 Encounters:  10/07/15 127 lb (57.607 kg)  10/04/15 130 lb (58.968 kg)  09/07/15 125 lb 4 oz (56.813 kg)      Review of Systems  Constitutional: Positive for fatigue. Negative for fever.  HENT: Negative for ear pain.   Eyes: Negative for pain.  Respiratory: Positive for shortness of breath. Negative for chest tightness and wheezing.   Cardiovascular: Negative for chest pain and leg swelling.  Gastrointestinal: Negative for abdominal pain.       Objective:   Physical Exam  Constitutional: Vital signs are normal. She appears well-developed and well-nourished. She is cooperative.  Non-toxic appearance. She does not appear ill. No distress.  Elderly female in NAD  HENT:  Head: Normocephalic.  Right Ear: Hearing, tympanic membrane, external ear and ear canal normal. Tympanic membrane is not erythematous, not retracted and not bulging.  Left Ear: Hearing, tympanic membrane, external ear and ear canal normal. Tympanic membrane is not erythematous, not retracted and not bulging.  Nose: Mucosal edema and rhinorrhea present. Right sinus exhibits no maxillary sinus tenderness and no frontal sinus tenderness. Left sinus exhibits no maxillary sinus  tenderness and no frontal sinus tenderness.  Mouth/Throat: Uvula is midline, oropharynx is clear and moist and mucous membranes are normal.  Eyes: Conjunctivae, EOM and lids are normal. Pupils are equal, round, and reactive to light. Lids are everted and swept, no foreign bodies found.  Neck: Trachea normal and normal range of motion. Neck supple. Carotid bruit is not present. No thyroid mass and no thyromegaly present.  Cardiovascular: Normal rate, regular rhythm, S1 normal, S2 normal, intact distal pulses and normal pulses.  Exam reveals no gallop and no friction rub.   Murmur heard.  Systolic murmur is present with a grade of 3/6  Pulmonary/Chest: Effort normal and breath sounds normal. No tachypnea. No respiratory distress. She has no decreased breath sounds. She has no wheezes. She has no rhonchi. She has no rales.  Neurological: She is alert.  Skin: Skin is warm, dry and intact. No rash noted.  She exhibits edema (1-2+ pitting edema BLE).  L anterior shin with 3 cm diameter wound skinabrasion , 1cm oblong mobile piece of attached dead tissue  without surrounding erythema   Psychiatric: Her speech is normal and behavior is normal. Judgment normal. Her mood appears not anxious. Cognition and memory are normal. She does not exhibit a depressed mood.          Assessment & Plan:   Community acquired pneumonia in setting of COPD exacerbation:  Pt improving on oral antibiotics on day 1/10, no fever and no hypoxia. o indication for hospitalization.  Continue antibiotics and prednisone taper.  Continued fatigue is  expected at this point.   Pt also with abrasion left anterior calf: 1 cm oblong area of dead tissue debrided with sterile scissors, no bleeding, no complications, no pain per pt Well healing otherwise.

## 2015-10-14 ENCOUNTER — Emergency Department: Payer: Medicare Other

## 2015-10-14 ENCOUNTER — Emergency Department
Admission: EM | Admit: 2015-10-14 | Discharge: 2015-10-14 | Disposition: A | Discharge status: Home / Self Care | Payer: Medicare Other | Attending: Emergency Medicine | Admitting: Emergency Medicine

## 2015-10-14 ENCOUNTER — Encounter: Payer: Self-pay | Admitting: Emergency Medicine

## 2015-10-14 ENCOUNTER — Telehealth: Payer: Self-pay | Admitting: Family Medicine

## 2015-10-14 DIAGNOSIS — J441 Chronic obstructive pulmonary disease with (acute) exacerbation: Secondary | ICD-10-CM | POA: Insufficient documentation

## 2015-10-14 DIAGNOSIS — I129 Hypertensive chronic kidney disease with stage 1 through stage 4 chronic kidney disease, or unspecified chronic kidney disease: Secondary | ICD-10-CM | POA: Diagnosis not present

## 2015-10-14 DIAGNOSIS — Z79899 Other long term (current) drug therapy: Secondary | ICD-10-CM | POA: Diagnosis not present

## 2015-10-14 DIAGNOSIS — J40 Bronchitis, not specified as acute or chronic: Secondary | ICD-10-CM | POA: Diagnosis not present

## 2015-10-14 DIAGNOSIS — Z7982 Long term (current) use of aspirin: Secondary | ICD-10-CM | POA: Diagnosis not present

## 2015-10-14 DIAGNOSIS — N184 Chronic kidney disease, stage 4 (severe): Secondary | ICD-10-CM | POA: Diagnosis not present

## 2015-10-14 DIAGNOSIS — Z87891 Personal history of nicotine dependence: Secondary | ICD-10-CM | POA: Diagnosis not present

## 2015-10-14 DIAGNOSIS — R531 Weakness: Secondary | ICD-10-CM | POA: Diagnosis not present

## 2015-10-14 DIAGNOSIS — Z880 Allergy status to penicillin: Secondary | ICD-10-CM | POA: Diagnosis not present

## 2015-10-14 DIAGNOSIS — R05 Cough: Secondary | ICD-10-CM | POA: Diagnosis not present

## 2015-10-14 DIAGNOSIS — Z792 Long term (current) use of antibiotics: Secondary | ICD-10-CM | POA: Insufficient documentation

## 2015-10-14 DIAGNOSIS — M6281 Muscle weakness (generalized): Secondary | ICD-10-CM | POA: Diagnosis not present

## 2015-10-14 LAB — CBC
HEMATOCRIT: 35.8 % (ref 35.0–47.0)
Hemoglobin: 11.6 g/dL — ABNORMAL LOW (ref 12.0–16.0)
MCH: 28.8 pg (ref 26.0–34.0)
MCHC: 32.3 g/dL (ref 32.0–36.0)
MCV: 89.4 fL (ref 80.0–100.0)
Platelets: 303 10*3/uL (ref 150–440)
RBC: 4.01 MIL/uL (ref 3.80–5.20)
RDW: 15 % — ABNORMAL HIGH (ref 11.5–14.5)
WBC: 12.5 10*3/uL — AB (ref 3.6–11.0)

## 2015-10-14 LAB — BASIC METABOLIC PANEL
Anion gap: 8 (ref 5–15)
BUN: 79 mg/dL — AB (ref 6–20)
CHLORIDE: 108 mmol/L (ref 101–111)
CO2: 22 mmol/L (ref 22–32)
CREATININE: 1.99 mg/dL — AB (ref 0.44–1.00)
Calcium: 9 mg/dL (ref 8.9–10.3)
GFR calc Af Amer: 23 mL/min — ABNORMAL LOW (ref >60)
GFR calc non Af Amer: 20 mL/min — ABNORMAL LOW (ref >60)
Glucose, Bld: 145 mg/dL — ABNORMAL HIGH (ref 65–99)
POTASSIUM: 4.7 mmol/L (ref 3.5–5.1)
SODIUM: 138 mmol/L (ref 135–145)

## 2015-10-14 LAB — TROPONIN I

## 2015-10-14 LAB — BRAIN NATRIURETIC PEPTIDE: B Natriuretic Peptide: 443 pg/mL — ABNORMAL HIGH (ref 0.0–100.0)

## 2015-10-14 MED ORDER — AZITHROMYCIN 250 MG PO TABS
500.0000 mg | ORAL_TABLET | Freq: Once | ORAL | Status: AC
Start: 2015-10-14 — End: 2015-10-14
  Administered 2015-10-14: 500 mg via ORAL
  Filled 2015-10-14: qty 2

## 2015-10-14 MED ORDER — AZITHROMYCIN 250 MG PO TABS: 250.0000 mg | ORAL_TABLET | Freq: Every day | ORAL | Status: DC

## 2015-10-14 NOTE — Telephone Encounter (Signed)
If her pneumonia not improving with current treatment, weakness etc.  I agree with ER visit for emergent eval.

## 2015-10-14 NOTE — Telephone Encounter (Signed)
Pt grand daughter in law stayed the night with pt last night. Pt is not getting any better. She is weak, and light headed, low energy. They almost took her to the hospital last night. She has not had any improvement with the medication prescribed. She is being treated for pneumonia. Pt states she is feeling so weak she is sick on her stomach.  [place]

## 2015-10-14 NOTE — ED Notes (Addendum)
Patient was diagnosed with Pneumonia 1 week ago by PCP and subsequently started on antibiotics. Patient states she has + cough when drinking which" burns her mouth" and + generalized weakness. Denies CP, ShOB

## 2015-10-14 NOTE — Discharge Instructions (Signed)
Upper Respiratory Infection, Adult Most upper respiratory infections (URIs) are a viral infection of the air passages leading to the lungs. A URI affects the nose, throat, and upper air passages. The most common type of URI is nasopharyngitis and is typically referred to as "the common cold." URIs run their course and usually go away on their own. Most of the time, a URI does not require medical attention, but sometimes a bacterial infection in the upper airways can follow a viral infection. This is called a secondary infection. Sinus and middle ear infections are common types of secondary upper respiratory infections. Bacterial pneumonia can also complicate a URI. A URI can worsen asthma and chronic obstructive pulmonary disease (COPD). Sometimes, these complications can require emergency medical care and may be life threatening.  CAUSES Almost all URIs are caused by viruses. A virus is a type of germ and can spread from one person to another.  RISKS FACTORS You may be at risk for a URI if:   You smoke.   You have chronic heart or lung disease.  You have a weakened defense (immune) system.   You are very young or very old.   You have nasal allergies or asthma.  You work in crowded or poorly ventilated areas.  You work in health care facilities or schools. SIGNS AND SYMPTOMS  Symptoms typically develop 2-3 days after you come in contact with a cold virus. Most viral URIs last 7-10 days. However, viral URIs from the influenza virus (flu virus) can last 14-18 days and are typically more severe. Symptoms may include:   Runny or stuffy (congested) nose.   Sneezing.   Cough.   Sore throat.   Headache.   Fatigue.   Fever.   Loss of appetite.   Pain in your forehead, behind your eyes, and over your cheekbones (sinus pain).  Muscle aches.  DIAGNOSIS  Your health care provider may diagnose a URI by:  Physical exam.  Tests to check that your symptoms are not due to  another condition such as:  Strep throat.  Sinusitis.  Pneumonia.  Asthma. TREATMENT  A URI goes away on its own with time. It cannot be cured with medicines, but medicines may be prescribed or recommended to relieve symptoms. Medicines may help:  Reduce your fever.  Reduce your cough.  Relieve nasal congestion. HOME CARE INSTRUCTIONS   Take medicines only as directed by your health care provider.   Gargle warm saltwater or take cough drops to comfort your throat as directed by your health care provider.  Use a warm mist humidifier or inhale steam from a shower to increase air moisture. This may make it easier to breathe.  Drink enough fluid to keep your urine clear or pale yellow.   Eat soups and other clear broths and maintain good nutrition.   Rest as needed.   Return to work when your temperature has returned to normal or as your health care provider advises. You may need to stay home longer to avoid infecting others. You can also use a face mask and careful hand washing to prevent spread of the virus.  Increase the usage of your inhaler if you have asthma.   Do not use any tobacco products, including cigarettes, chewing tobacco, or electronic cigarettes. If you need help quitting, ask your health care provider. PREVENTION  The best way to protect yourself from getting a cold is to practice good hygiene.   Avoid oral or hand contact with people with cold   symptoms.   Wash your hands often if contact occurs.  There is no clear evidence that vitamin C, vitamin E, echinacea, or exercise reduces the chance of developing a cold. However, it is always recommended to get plenty of rest, exercise, and practice good nutrition.  SEEK MEDICAL CARE IF:   You are getting worse rather than better.   Your symptoms are not controlled by medicine.   You have chills.  You have worsening shortness of breath.  You have brown or red mucus.  You have yellow or brown nasal  discharge.  You have pain in your face, especially when you bend forward.  You have a fever.  You have swollen neck glands.  You have pain while swallowing.  You have white areas in the back of your throat. SEEK IMMEDIATE MEDICAL CARE IF:   You have severe or persistent:  Headache.  Ear pain.  Sinus pain.  Chest pain.  You have chronic lung disease and any of the following:  Wheezing.  Prolonged cough.  Coughing up blood.  A change in your usual mucus.  You have a stiff neck.  You have changes in your:  Vision.  Hearing.  Thinking.  Mood. MAKE SURE YOU:   Understand these instructions.  Will watch your condition.  Will get help right away if you are not doing well or get worse.   This information is not intended to replace advice given to you by your health care provider. Make sure you discuss any questions you have with your health care provider.   Document Released: 03/27/2001 Document Revised: 02/15/2015 Document Reviewed: 01/06/2014 Elsevier Interactive Patient Education 2016 [employer].  

## 2015-10-14 NOTE — Telephone Encounter (Signed)
Elizabeth Silva daughter Collie Siad called. She is really concerned about Elizabeth Silva. They would like to know if they need to take Elizabeth Silva to hospital or can something else be done. Please advise

## 2015-10-14 NOTE — ED Provider Notes (Addendum)
[facility] Emergency Department Provider Note  Time seen: 3:22 PM  I have reviewed the triage vital signs and the nursing notes.   HISTORY  Chief Complaint Weakness    HPI Elizabeth Silva is a 81 y.o. female with a past medical history of COPD, gastric reflux, hyperlipidemia, hypertension, CK D, heart failure, who presents the emergency department with cough and congestion. According to the patient one week ago on 10/07/15 she was diagnosed with pneumonia by her primary care provider. States she was placed on doxycycline as well as prednisone. Patient states she continues to cough and feels generalized weakness that she came to the emergency department for evaluation. Denies chest pain. Does feel short of breath. Denies leg swelling. Denies fever.Describes her symptoms as moderate. Does have a productive cough.     Past Medical History  Diagnosis Date  . Allergic rhinitis   . COPD (chronic obstructive pulmonary disease) ([place])   . Diverticulosis of colon   . GERD (gastroesophageal reflux disease)   . Hyperlipidemia   . Hypertension     LE swelling with amlodipine, intolerant of ACEIs  . Osteoarthritis   . CKD (chronic kidney disease)   . Impaired vision     right eye, was told due to HTN  . LBBB (left bundle branch block)   . Diastolic CHF, acute (HCC)     echo (4/11) with EF 60-65%,mild LVH, mild aortic insufficiency, mild MR, PA systolic pressure 03-21 mmHg, mild to moderate TR  . History of PFTs   . H/O Clostridium difficile infection Sept. 2013    Patient Active Problem List   Diagnosis Date Noted  . Pneumonia, community acquired 10/07/2015  . Chest tightness 10/04/2015  . Skin tear of left lower leg without complication 22/48/2500  . Acute pain of right knee 08/12/2015  . Auditory hallucination 01/14/2015  . Vision loss, bilateral 10/26/2014  . Carotid stenosis 10/26/2014  . Bilateral tinnitus 04/20/2014  . Prediabetes 01/07/2014  .  Bilateral knee pain 01/07/2014  . Hip pain, left 01/07/2014  . COPD exacerbation ([place]) 10/14/2013  . Fatigue 08/28/2013  . Insomnia 01/02/2013  . Abnormal chest CT 07/18/2012  . Anemia of chronic renal failure 12/25/2010  . CONSTIPATION 12/08/2010  . DIASTOLIC HEART FAILURE, CHRONIC 03/02/2010  . CAROTID BRUIT, RIGHT 03/02/2010  . INCONTINENCE, URGE 01/25/2010  . PERIPHERAL NEUROPATHY, IDIOPATHIC 05/10/2008  . DYSURIA, CHRONIC 02/27/2008  . PERIPHERAL VASCULAR DISEASE 05/05/2007  . Chronic venous insufficiency 05/05/2007  . ALLERGIC RHINITIS 05/05/2007  . COPD 05/05/2007  . GERD 05/05/2007  . DIVERTICULOSIS, COLON 05/05/2007  . Osteoarthritis, multiple sites 05/05/2007  . DEGENERATION, CERVICAL DISC 05/05/2007  . STRESS INCONTINENCE 05/05/2007  . HYPERCHOLESTEROLEMIA, PURE 04/07/2007  . RENAL STONE 04/07/2007  . Essential hypertension, benign 03/17/2007  . Chronic kidney disease, stage 4, severely decreased GFR ([place]) 03/17/2007  . CARDIAC MURMUR 03/17/2007    Past Surgical History  Procedure Laterality Date  . Cardiac catheterization  1999    normal  . Total abdominal hysterectomy  1970    suspicious cells  . Cholecystectomy  1980  . Cervical disc surgery  2005    cerv. disc, dz. C5-C7, facet arthritis    Current Outpatient Rx  Name  Route  Sig  Dispense  Refill  . acetaminophen (TYLENOL) 500 MG tablet   Oral   Take 500 mg by mouth every 6 (six) hours as needed.           Marland Kitchen acidophilus (RISAQUAD) CAPS capsule  Oral   Take 1 capsule by mouth daily.         Marland Kitchen ADVAIR DISKUS 250-50 MCG/DOSE AEPB      Inhale 1 puff into the  lungs two times daily   180 each   0   . albuterol (PROAIR HFA) 108 (90 BASE) MCG/ACT inhaler   Inhalation   Inhale 2 puffs into the lungs every 6 (six) hours as needed for wheezing or shortness of breath.   3 Inhaler   1   . amLODipine (NORVASC) 5 MG tablet      Take 1 tablet by mouth two  times daily   180 tablet   0   .  aspirin 81 MG EC tablet   Oral   Take 81 mg by mouth daily.           . ferrous fumarate (HEMOCYTE - 106 MG FE) 325 (106 FE) MG TABS tablet   Oral   Take 1 tablet (106 mg of iron total) by mouth daily.   90 each   3   . fluticasone (FLONASE) 50 MCG/ACT nasal spray   Each Nare   Place 2 sprays into both nostrils daily. Patient taking differently: Place 2 sprays into both nostrils as needed.    48 g   3   . furosemide (LASIX) 20 MG tablet   Oral   Take 1 tablet (20 mg total) by mouth daily as needed.   90 tablet   3   . gabapentin (NEURONTIN) 600 MG tablet      Take 1 tablet by mouth at  bedtime   90 tablet   3   . hydrALAZINE (APRESOLINE) 100 MG tablet      Take 1 tablet by mouth 3  times a day   270 tablet   1   . lovastatin (MEVACOR) 20 MG tablet   Oral   Take 1 tablet (20 mg total) by mouth at bedtime.   90 tablet   3   . nebivolol (BYSTOLIC) 10 MG tablet      Take 1 tablet by mouth  daily   90 tablet   1   . omeprazole (PRILOSEC) 40 MG capsule      Take 1 capsule by mouth  daily Patient taking differently: Take 40 mg by mouth as needed. Take 1 capsule by mouth  daily   90 capsule   1   . oxybutynin (DITROPAN) 5 MG tablet      Take 1 tablet by mouth two  times daily   180 tablet   0   . tiotropium (SPIRIVA HANDIHALER) 18 MCG inhalation capsule      Inhale the contents of 1  capsule via HandiHaler  daily   90 capsule   1   . traMADol (ULTRAM) 50 MG tablet   Oral   Take 0.5-1 tablets (25-50 mg total) by mouth every 8 (eight) hours as needed for moderate pain. Patient taking differently: Take 50 mg by mouth daily.    45 tablet   0   . vitamin E 1000 UNIT capsule   Oral   Take 1,000 Units by mouth daily.           Marland Kitchen doxycycline (VIBRAMYCIN) 100 MG capsule   Oral   Take 1 capsule (100 mg total) by mouth 2 (two) times daily.   20 capsule   0   . predniSONE (DELTASONE) 20 MG tablet      3 tabs by mouth daily x  3 days, then 2 tabs by  mouth daily x 2 days then 1 tab by mouth daily x 2 days   15 tablet   0     Allergies Levofloxacin and Penicillins  Family History  Problem Relation Age of Onset  . Heart block Mother     complete  . Dementia Father   . Colon cancer Sister   . Dementia Sister   . Lung cancer Brother   . Cancer Brother     sinus  . Diabetes Brother     Social History Social History  Substance Use Topics  . Smoking status: Former Smoker -- 1.00 packs/day    Types: Cigarettes    Quit date: 10/15/1984  . Smokeless tobacco: Never Used  . Alcohol Use: Yes    Review of Systems Constitutional: Negative for fever. ENT: Positive for congestion Cardiovascular: Negative for chest pain. Respiratory: Positive for mild shortness of breath. Positive for cough Gastrointestinal: Negative for abdominal pain, vomiting and diarrhea. Musculoskeletal: Negative for back pain. Neurological: Negative for headache 10-point ROS otherwise negative.  ____________________________________________   PHYSICAL EXAM:  VITAL SIGNS: ED Triage Vitals  Enc Vitals Group     BP 10/14/15 1412 152/57 mmHg     Pulse Rate 10/14/15 1412 65     Resp 10/14/15 1412 18     Temp 10/14/15 1412 97.8 F (36.6 C)     Temp Source 10/14/15 1412 Oral     SpO2 10/14/15 1447 97 %     Weight 10/14/15 1412 125 lb (56.7 kg)     Height 10/14/15 1412 '5\' 1"'$  (1.549 m)     Head Cir --      Peak Flow --      Pain Score --      Pain Loc --      Pain Edu? --      Excl. in [place]? --     Constitutional: Alert and oriented. Well appearing and in no distress. Eyes: Normal exam ENT   Head: Normocephalic and atraumatic   Mouth/Throat: Mucous membranes are moist. Cardiovascular: Normal rate, regular rhythm. No murmur Respiratory: Normal respiratory effort without tachypnea nor retractions. Breath sounds are clear and equal bilaterally. No wheezes/rales/rhonchi. Gastrointestinal: Soft and nontender. No distention.  Musculoskeletal:  Nontender with normal range of motion in all extremities. No lower extremity tenderness or edema. Neurologic:  Normal speech and language. No gross focal neurologic deficits Skin:  Skin is warm, dry and intact.  Psychiatric: Mood and affect are normal. Speech and behavior are normal.  ____________________________________________    EKG  EKG reviewed and interpreted by myself shows sinus rhythm at 62 bpm, widened QRS, left axis deviation, nonspecific ST changes. Morphology most consistent with left bundle branch block.  ____________________________________________    RADIOLOGY  Chest x-ray shows no acute abnormalities.  ____________________________________________   INITIAL IMPRESSION / ASSESSMENT AND PLAN / ED COURSE  Pertinent labs & imaging results that were available during my care of the patient were reviewed by me and considered in my medical decision making (see chart for details).  Patient presents the emergency department with cough, and generalized weakness. Patient states she has been taking prednisone and doxycycline, but continues to cough and feels the weakness has increased. We will check labs, chest x-ray, EKG, and closely monitor in the emergency department.  Chest x-ray shows no acute abnormality. Labs are largely at the patient's baseline including a negative troponin. Slight white blood cell elevation however the patient has just finished a course  of prednisone. Vitals are within normal limits. I discussed with the patient admission for her generalized weakness versus discharge home on Zithromax. The patient much prefers to go home at this time. Family member states the patient can stay with her the next several days to ensure that she is improving. I discussed return precautions, they are agreeable to return if symptoms or weakness worsens.  ____________________________________________   FINAL CLINICAL IMPRESSION(S) / ED DIAGNOSES  Cough Generalized  weakness Bronchitis  Harvest Dark, MD 10/14/15 1647  Harvest Dark, MD 10/26/15 1530

## 2015-10-14 NOTE — Telephone Encounter (Signed)
Pt daughter Collie Siad notified of Dr. Rometta Emery comments and verbalized understanding.

## 2015-10-18 ENCOUNTER — Other Ambulatory Visit: Payer: Self-pay | Admitting: Family Medicine

## 2015-10-18 ENCOUNTER — Ambulatory Visit: Payer: Medicare Other | Admitting: Family Medicine

## 2015-10-24 ENCOUNTER — Telehealth: Payer: Self-pay | Admitting: *Deleted

## 2015-10-24 NOTE — Telephone Encounter (Signed)
Last office visit 10/07/2015.  Last refilled 08/01/2015 for #45 with no refills.  Ok to refill?  Please print to [facility].

## 2015-10-25 ENCOUNTER — Ambulatory Visit: Payer: Medicare Other | Admitting: Cardiovascular Disease

## 2015-10-25 MED ORDER — TRAMADOL HCL 50 MG PO TABS: 50.0000 mg | ORAL_TABLET | Freq: Every day | ORAL | Status: DC

## 2015-10-25 NOTE — Telephone Encounter (Signed)
Rx faxed to [facility] [phone]

## 2015-10-27 ENCOUNTER — Ambulatory Visit (INDEPENDENT_AMBULATORY_CARE_PROVIDER_SITE_OTHER): Payer: Medicare Other | Admitting: Family Medicine

## 2015-10-27 ENCOUNTER — Encounter: Payer: Self-pay | Admitting: Family Medicine

## 2015-10-27 ENCOUNTER — Other Ambulatory Visit: Payer: Self-pay | Admitting: Internal Medicine

## 2015-10-27 VITALS — BP 139/49 | HR 52 | Temp 98.3°F | Ht 61.0 in | Wt 124.5 lb

## 2015-10-27 DIAGNOSIS — R0600 Dyspnea, unspecified: Secondary | ICD-10-CM | POA: Diagnosis not present

## 2015-10-27 DIAGNOSIS — R1310 Dysphagia, unspecified: Secondary | ICD-10-CM | POA: Insufficient documentation

## 2015-10-27 NOTE — Progress Notes (Signed)
Subjective:    Patient ID: Elizabeth Silva, female    DOB: Oct 13, 1918, 82 y.o.   MRN: [id number]  HPI   82 year old female presents with continued cough, shortness of breath.   She was treated in 09/2015 several visits for COPD exac and CA PNA with  Doxy and prednisone taper.   She was seen in ED on 12/30 for  Weakness. Chest x-ray showed no acute abnormality. Labs were largely at the patient's baseline including a negative troponin. EKG stable. Slight white blood cell elevation however the patient had just finished a course of prednisone. Discharged home on Zithromax.  Today she reports she noted minimal improvement in congestion, but worse again in last week. She remains short of breath. Mucus is white.  She is wheezing. She has burning mouth and throat.  no fever.  Using mucinex and coricidin in last week.   She using advair and spiriva.  She has not needed the albuterol rescue.   She also noted in November prior to all these issues she feels she was exposured to fumes from the furnace. No issue with the furnace.  She is moving into assisted living in next week.  Daughter feels the house is very hot. Now has humidifier. Daughter has noted she coughs when she drinks water or eats meals.   Review of Systems  Constitutional: Positive for fatigue. Negative for fever.  HENT: Negative for ear pain.   Eyes: Negative for pain.  Respiratory: Positive for shortness of breath. Negative for chest tightness and wheezing.   Cardiovascular: Negative for chest pain and leg swelling.  Gastrointestinal: Negative for abdominal pain.       Objective:   Physical Exam  Constitutional: Vital signs are normal. She appears well-developed and well-nourished. She is cooperative.  Non-toxic appearance. She does not appear ill. No distress.  Elderly female in NAD  HENT:  Head: Normocephalic.  Right Ear: Hearing, tympanic membrane, external ear and ear canal normal. Tympanic membrane is not  erythematous, not retracted and not bulging.  Left Ear: Hearing, tympanic membrane, external ear and ear canal normal. Tympanic membrane is not erythematous, not retracted and not bulging.  Nose: Mucosal edema and rhinorrhea present. Right sinus exhibits no maxillary sinus tenderness and no frontal sinus tenderness. Left sinus exhibits no maxillary sinus tenderness and no frontal sinus tenderness.  Mouth/Throat: Uvula is midline, oropharynx is clear and moist and mucous membranes are normal.  Eyes: Conjunctivae, EOM and lids are normal. Pupils are equal, round, and reactive to light. Lids are everted and swept, no foreign bodies found.  Neck: Trachea normal and normal range of motion. Neck supple. Carotid bruit is not present. No thyroid mass and no thyromegaly present.  Cardiovascular: Normal rate, regular rhythm, S1 normal, S2 normal, intact distal pulses and normal pulses.  Exam reveals no gallop and no friction rub.   Murmur heard.  Systolic murmur is present with a grade of 3/6  Pulmonary/Chest: Effort normal and breath sounds normal. No tachypnea. No respiratory distress. She has no decreased breath sounds. She has no wheezes. She has no rhonchi. She has no rales.  Neurological: She is alert.  Skin: Skin is warm, dry and intact. No rash noted.  No peripheral edema    Psychiatric: Her speech is normal and behavior is normal. Judgment normal. Her mood appears not anxious. Cognition and memory are normal. She does not exhibit a depressed mood.          Assessment & Plan:

## 2015-10-27 NOTE — Progress Notes (Signed)
Pre visit review using our clinic review tool, if applicable. No additional management support is needed unless otherwise documented below in the visit note. 

## 2015-10-27 NOTE — Patient Instructions (Addendum)
Stop at front desk for referral to  swallowing study, Chest CT.  Increae advair to 2 times a day. Continue spriva.  Use albuterol as needed. Follow up in 2 weeks 30 min OV.

## 2015-11-01 ENCOUNTER — Ambulatory Visit: Payer: Medicare Other

## 2015-11-03 ENCOUNTER — Telehealth: Payer: Self-pay

## 2015-11-03 DIAGNOSIS — M25561 Pain in right knee: Secondary | ICD-10-CM

## 2015-11-03 DIAGNOSIS — M159 Polyosteoarthritis, unspecified: Secondary | ICD-10-CM

## 2015-11-03 NOTE — Telephone Encounter (Signed)
[name] Sue(DPR signed) left v/m requesting prescription for walker faxed to [place]; pt moved into assisted living this week; pt was seen 10/27/15; pt has 15 min(not 30 min as noted in 10/27/15 office note) f/u appt on 11/18/15. Collie Siad request cb.

## 2015-11-04 NOTE — Telephone Encounter (Signed)
DME order for walker with seat placed in EPIC.  Melissa Stenson with [facility] notified order has been placed.  Collie Siad (daughter) notified walker has been ordered through [facility]

## 2015-11-04 NOTE — Telephone Encounter (Signed)
In donna's outbox.

## 2015-11-07 ENCOUNTER — Other Ambulatory Visit: Payer: Self-pay | Admitting: *Deleted

## 2015-11-07 ENCOUNTER — Other Ambulatory Visit: Payer: Self-pay | Admitting: Family Medicine

## 2015-11-07 ENCOUNTER — Ambulatory Visit
Admission: RE | Admit: 2015-11-07 | Discharge: 2015-11-07 | Disposition: A | Discharge status: Home / Self Care | Payer: Medicare Other | Source: Ambulatory Visit | Attending: Family Medicine | Admitting: Family Medicine

## 2015-11-07 DIAGNOSIS — R918 Other nonspecific abnormal finding of lung field: Secondary | ICD-10-CM | POA: Diagnosis not present

## 2015-11-07 DIAGNOSIS — I2510 Atherosclerotic heart disease of native coronary artery without angina pectoris: Secondary | ICD-10-CM | POA: Diagnosis not present

## 2015-11-07 DIAGNOSIS — I700 Atherosclerosis of aorta: Secondary | ICD-10-CM | POA: Insufficient documentation

## 2015-11-07 DIAGNOSIS — R1310 Dysphagia, unspecified: Secondary | ICD-10-CM | POA: Diagnosis not present

## 2015-11-07 DIAGNOSIS — I517 Cardiomegaly: Secondary | ICD-10-CM | POA: Diagnosis not present

## 2015-11-07 DIAGNOSIS — R0600 Dyspnea, unspecified: Secondary | ICD-10-CM | POA: Diagnosis not present

## 2015-11-07 DIAGNOSIS — R0602 Shortness of breath: Secondary | ICD-10-CM | POA: Diagnosis not present

## 2015-11-08 ENCOUNTER — Other Ambulatory Visit: Payer: Self-pay | Admitting: Family Medicine

## 2015-11-08 NOTE — Telephone Encounter (Signed)
Spoke with Collie Siad and pt has not gotten tramadol from [facility]. Collie Siad will contact [facility] to see if mailed and if so when; Collie Siad will cb if needed.

## 2015-11-08 NOTE — Telephone Encounter (Signed)
Spoke with Elizabeth Silva.  I advised the original Rx was faxed on 10/25/2015.  I did refax Rx to [facility] today at 4:00 pm  Advised they may want to call them in the morning to make sure they received the faxed Rx this time and to advise them that she is out of her Tramadol so they can expedite shipment.

## 2015-11-08 NOTE — Telephone Encounter (Signed)
Collie Siad spoke with pt and [facility] had [facility] and [facility] not [facility] for tramadol. Collie Siad request cb at [phone] or pt [phone].

## 2015-11-10 ENCOUNTER — Ambulatory Visit: Payer: Medicare Other | Admitting: Family Medicine

## 2015-11-14 ENCOUNTER — Telehealth: Payer: Self-pay | Admitting: Family Medicine

## 2015-11-14 NOTE — Telephone Encounter (Signed)
Elizabeth Silva called wanting to talk to Elizabeth Silva about Elizabeth Silva ct results She stating what they read on mychart the impression from radiologist was concerning for lung cancer. She wanted to know if it was cancer do they need to tell Elizabeth Silva because she would only worry about it if there was not treatment What would Elizabeth Silva advise

## 2015-11-14 NOTE — Telephone Encounter (Signed)
Norma notified by telephone that Dr. Diona Browner would call her tomorrow to discuss this in detail with her.

## 2015-11-14 NOTE — Telephone Encounter (Signed)
Let norma know that I will call her tommorow to discuss in detail.

## 2015-11-15 NOTE — Telephone Encounter (Signed)
Discussed in detail with [facility]. CT showed one area concerning for low grade adenocarcinoma, but appears slow change compared to the 2014 Recommended letting pt know about results but reassure her.  Agreed comfort care best at this age and with her co-morbidities. Constance Holster did not want further biopsy and eval done. She will verify with her sisters.  Collie Siad, sister will come to OV with pt to discuss the results on Friday.

## 2015-11-15 NOTE — Telephone Encounter (Signed)
Called and left a message for Constance Holster to return phone call today before 4:30

## 2015-11-18 ENCOUNTER — Encounter: Payer: Self-pay | Admitting: Family Medicine

## 2015-11-18 ENCOUNTER — Ambulatory Visit (INDEPENDENT_AMBULATORY_CARE_PROVIDER_SITE_OTHER): Payer: Medicare Other | Admitting: Family Medicine

## 2015-11-18 VITALS — BP 130/42 | HR 68 | Temp 97.9°F | Ht 61.0 in | Wt 124.5 lb

## 2015-11-18 DIAGNOSIS — J449 Chronic obstructive pulmonary disease, unspecified: Secondary | ICD-10-CM

## 2015-11-18 DIAGNOSIS — I10 Essential (primary) hypertension: Secondary | ICD-10-CM

## 2015-11-18 DIAGNOSIS — R1310 Dysphagia, unspecified: Secondary | ICD-10-CM

## 2015-11-18 DIAGNOSIS — J441 Chronic obstructive pulmonary disease with (acute) exacerbation: Secondary | ICD-10-CM | POA: Diagnosis not present

## 2015-11-18 MED ORDER — TRAMADOL HCL 50 MG PO TABS: 50.0000 mg | ORAL_TABLET | Freq: Two times a day (BID) | ORAL | Status: DC | PRN

## 2015-11-18 NOTE — Progress Notes (Signed)
Pre visit review using our clinic review tool, if applicable. No additional management support is needed unless otherwise documented below in the visit note. 

## 2015-11-18 NOTE — Progress Notes (Signed)
   Subjective:    Patient ID: Elizabeth Silva, female    DOB: 04/29/1918, 82 y.o.   MRN: [id number]  HPI   82 year old female presents for follow up.  She feels that she continues to cough,  shortness of breath but better than in the past. and feeling overall well being is better.  She is feeling somewhat weak. Body pain improved, but still with knee pain. Tramadol helps .Marland Kitchen Taking once a day, would like to take a second dose later in the day.  She has increased the inhaler.. Advair.. Not sure if it has helped.    She feels better overall.  She doing well with the new walker.  She has moved to an assisted living.  She is eating well.   Wt Readings from Last 3 Encounters:  11/18/15 124 lb 8 oz (56.473 kg)  10/27/15 124 lb 8 oz (56.473 kg)  10/14/15 125 lb (56.7 kg)   Dysphagia,coughing with swallowing. She fels it is not happening as much as in the past. Continued but she is not interested in swallowing study at this time.  BP Readings from Last 3 Encounters:  11/18/15 130/42  10/27/15 139/49  10/14/15 145/43     Review of Systems  Constitutional: Positive for fatigue. Negative for fever.  HENT: Negative for ear pain.   Eyes: Negative for pain.  Respiratory: Positive for cough.   Cardiovascular: Negative for chest pain and leg swelling.  Gastrointestinal: Negative for abdominal pain.       Objective:   Physical Exam  Constitutional: Vital signs are normal. She appears well-developed and well-nourished. She is cooperative.  Non-toxic appearance. She does not appear ill. No distress.  Elderly female in NAD  HENT:  Head: Normocephalic.  Right Ear: Hearing, tympanic membrane, external ear and ear canal normal. Tympanic membrane is not erythematous, not retracted and not bulging.  Left Ear: Hearing, tympanic membrane, external ear and ear canal normal. Tympanic membrane is not erythematous, not retracted and not bulging.  Nose: Mucosal edema and rhinorrhea present. Right  sinus exhibits no maxillary sinus tenderness and no frontal sinus tenderness. Left sinus exhibits no maxillary sinus tenderness and no frontal sinus tenderness.  Mouth/Throat: Uvula is midline, oropharynx is clear and moist and mucous membranes are normal.  Eyes: Conjunctivae, EOM and lids are normal. Pupils are equal, round, and reactive to light. Lids are everted and swept, no foreign bodies found.  Neck: Trachea normal and normal range of motion. Neck supple. Carotid bruit is not present. No thyroid mass and no thyromegaly present.  Cardiovascular: Normal rate, regular rhythm, S1 normal, S2 normal, intact distal pulses and normal pulses.  Exam reveals no gallop and no friction rub.   Murmur heard.  Systolic murmur is present with a grade of 3/6  Pulmonary/Chest: Effort normal and breath sounds normal. No tachypnea. No respiratory distress. She has no decreased breath sounds. She has no wheezes. She has no rhonchi. She has no rales.  Neurological: She is alert.  Skin: Skin is warm, dry and intact. No rash noted.  No peripheral edema    Psychiatric: Her speech is normal and behavior is normal. Judgment normal. Her mood appears not anxious. Cognition and memory are normal. She does not exhibit a depressed mood.          Assessment & Plan:

## 2015-11-18 NOTE — Assessment & Plan Note (Signed)
Given persistent symptoms despite treatment with antibiotics. Increase advair and refer for CT chest to re-eval.  Use albuterol as needed.

## 2015-11-18 NOTE — Patient Instructions (Addendum)
Increase tramadol to twice a day as needed for knee pain.  Stay on higher dose of Advair. Keep appt as scheduled.

## 2015-11-18 NOTE — Assessment & Plan Note (Signed)
Will send for swallowing evaluation.

## 2015-11-19 DIAGNOSIS — J029 Acute pharyngitis, unspecified: Secondary | ICD-10-CM | POA: Diagnosis not present

## 2015-11-23 DIAGNOSIS — H6981 Other specified disorders of Eustachian tube, right ear: Secondary | ICD-10-CM | POA: Diagnosis not present

## 2015-11-23 DIAGNOSIS — H6501 Acute serous otitis media, right ear: Secondary | ICD-10-CM | POA: Diagnosis not present

## 2015-11-25 ENCOUNTER — Ambulatory Visit: Payer: Medicare Other

## 2015-12-05 ENCOUNTER — Other Ambulatory Visit: Payer: Self-pay | Admitting: Family Medicine

## 2015-12-07 ENCOUNTER — Encounter: Payer: Self-pay | Admitting: Cardiovascular Disease

## 2015-12-07 ENCOUNTER — Ambulatory Visit (INDEPENDENT_AMBULATORY_CARE_PROVIDER_SITE_OTHER): Payer: Medicare Other | Admitting: Cardiovascular Disease

## 2015-12-07 VITALS — BP 122/50 | HR 65 | Ht 61.0 in | Wt 122.5 lb

## 2015-12-07 DIAGNOSIS — R0602 Shortness of breath: Secondary | ICD-10-CM

## 2015-12-07 DIAGNOSIS — I6523 Occlusion and stenosis of bilateral carotid arteries: Secondary | ICD-10-CM

## 2015-12-07 DIAGNOSIS — I10 Essential (primary) hypertension: Secondary | ICD-10-CM

## 2015-12-07 DIAGNOSIS — E7800 Pure hypercholesterolemia, unspecified: Secondary | ICD-10-CM

## 2015-12-07 DIAGNOSIS — R0989 Other specified symptoms and signs involving the circulatory and respiratory systems: Secondary | ICD-10-CM

## 2015-12-07 DIAGNOSIS — I5032 Chronic diastolic (congestive) heart failure: Secondary | ICD-10-CM | POA: Diagnosis not present

## 2015-12-07 DIAGNOSIS — I739 Peripheral vascular disease, unspecified: Secondary | ICD-10-CM

## 2015-12-07 DIAGNOSIS — R0789 Other chest pain: Secondary | ICD-10-CM | POA: Diagnosis not present

## 2015-12-07 DIAGNOSIS — R011 Cardiac murmur, unspecified: Secondary | ICD-10-CM

## 2015-12-07 NOTE — Patient Instructions (Signed)
You are doing well. No medication changes were made.  We will order a carotid ultrasound for bruit both sides  Please call us if you have new issues that need to be addressed before your next appt.  Your physician wants you to follow-up in: 6 months.  You will receive a reminder letter in the mail two months in advance. If you don't receive a letter, please call our office to schedule the follow-up appointment.

## 2015-12-07 NOTE — Progress Notes (Signed)
Patient ID: Elizabeth Silva, female    DOB: 08/19/1918, 82 y.o.   MRN: [id number]  HPI Comments: 82 yo with history of malignant HTN, COPD, hyperlipidemia, left bundle-branch block, moderate tricuspid valve regurgitation since 2011 with normal ejection fraction in 2012, presents today for routine followup of her hypertension. Ophthalmologist feels her vision loss is secondary to atherosclerotic disease and hypertension. vision loss in her left eye. 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 History of carotid arterial disease  In follow-up today, she reports that her blood pressure has been fine Denies any lightheadedness or dizziness on standing Had a fall 6 months ago, none since then Previously stopped her lovastatin, reports she is taking this again  Recent CT scan of her chest for possible pneumonia shows diffuse aortic atherosclerosis, coronary artery disease noted in the proximal LAD Images reviewed with her in detail  Vision continues to cause major issues, some progression  EKG on today's visit shows normal sinus rhythm with rate 65 bpm, left bundle branch block  Other past medical history She does report having myalgias on simvastatin in the past  She Continues to live independently.   She lives on a split level, uses the stairs frequently. Chronic fatigue/poor energy poor several years. She's not using her cane. Does not go out of the house much, once a week maybe goes to [place] admission for sepsis, recurrent pneumonia, diagnosed with C. Difficile.  treated with Flagyl and diarrhea improved. Notes from the hospital indicate Klebsiella urinary tract infection treated with antibiotics, C. difficile improving on Flagyl, bradycardia, recurrent pneumonia that was improving on antibiotics, sepsis that was treated and improved, chronic kidney disease.    Previous  Echo was suggestive of diastolic dysfunction with normal EF (60-65%). Moderate TR          Allergies  Allergen Reactions  . Levofloxacin     REACTION: Burning in legs  . Penicillins     REACTION: Rash    Outpatient Encounter Prescriptions as of 12/07/2015  Medication Sig  . acetaminophen (TYLENOL) 500 MG tablet Take 500 mg by mouth every 6 (six) hours as needed.    Marland Kitchen acidophilus (RISAQUAD) CAPS capsule Take 1 capsule by mouth daily.  Marland Kitchen ADVAIR DISKUS 250-50 MCG/DOSE AEPB Inhale 1 puff into the  lungs two times daily  . albuterol (PROAIR HFA) 108 (90 BASE) MCG/ACT inhaler Inhale 2 puffs into the lungs every 6 (six) hours as needed for wheezing or shortness of breath.  Marland Kitchen amLODipine (NORVASC) 5 MG tablet Take 1 tablet by mouth two  times daily  . aspirin 81 MG EC tablet Take 81 mg by mouth daily.    Marland Kitchen BYSTOLIC 10 MG tablet Take 1 tablet by mouth  daily  . ferrous fumarate (HEMOCYTE - 106 MG FE) 325 (106 FE) MG TABS tablet Take 1 tablet (106 mg of iron total) by mouth daily.  . fluticasone (FLONASE) 50 MCG/ACT nasal spray Place 2 sprays into both nostrils daily. (Patient taking differently: Place 2 sprays into both nostrils as needed. )  . furosemide (LASIX) 20 MG tablet Take 1 tablet (20 mg total) by mouth daily as needed.  . gabapentin (NEURONTIN) 600 MG tablet Take 1 tablet by mouth at  bedtime  . hydrALAZINE (APRESOLINE) 100 MG tablet Take 1 tablet by mouth 3  times a day  . lovastatin (MEVACOR) 20 MG tablet Take 1 tablet (20 mg total) by mouth at bedtime.  Marland Kitchen omeprazole (PRILOSEC) 40 MG capsule Take  1 capsule by mouth  daily (Patient taking differently: Take 40 mg by mouth as needed. Take 1 capsule by mouth  daily)  . oxybutynin (DITROPAN) 5 MG tablet Take 1 tablet by mouth two  times daily  . SPIRIVA HANDIHALER 18 MCG inhalation capsule Inhale the contents of 1  capsule via HandiHaler  daily  . traMADol (ULTRAM) 50 MG tablet Take 1 tablet (50 mg total) by mouth 2 (two) times daily as needed for moderate pain.  . vitamin E 1000 UNIT capsule Take 1,000 Units by mouth daily.      No facility-administered encounter medications on file as of 12/07/2015.    Past Medical History  Diagnosis Date  . Allergic rhinitis   . COPD (chronic obstructive pulmonary disease) ([place])   . Diverticulosis of colon   . GERD (gastroesophageal reflux disease)   . Hyperlipidemia   . Hypertension     LE swelling with amlodipine, intolerant of ACEIs  . Osteoarthritis   . CKD (chronic kidney disease)   . Impaired vision     right eye, was told due to HTN  . LBBB (left bundle branch block)   . Diastolic CHF, acute ([facility])     echo (4/11) with EF 60-65%,mild LVH, mild aortic insufficiency, mild MR, PA systolic pressure 97-98 mmHg, mild to moderate TR  . History of PFTs   . H/O Clostridium difficile infection Sept. 2013    Past Surgical History  Procedure Laterality Date  . Cardiac catheterization  1999    normal  . Total abdominal hysterectomy  1970    suspicious cells  . Cholecystectomy  1980  . Cervical disc surgery  2005    cerv. disc, dz. C5-C7, facet arthritis    Social History  reports that she quit smoking about 31 years ago. Her smoking use included Cigarettes. She smoked 1.00 pack per day. She has never used smokeless tobacco. She reports that she drinks alcohol. She reports that she does not use illicit drugs.  Family History family history includes Cancer in her brother; Colon cancer in her sister; Dementia in her father and sister; Diabetes in her brother; Heart block in her mother; Lung cancer in her brother.  Review of Systems  Eyes: Positive for visual disturbance.  Respiratory: Negative.   Cardiovascular: Negative.   Gastrointestinal: Negative.   Musculoskeletal: Positive for gait problem.  Hematological: Negative.   Psychiatric/Behavioral: Negative.   All other systems reviewed and are negative.  BP 122/50 mmHg  Pulse 65  Ht '5\' 1"'$  (1.549 m)  Wt 122 lb 8 oz (55.566 kg)  BMI 23.16 kg/m2  Physical Exam  Constitutional: She is oriented to person,  place, and time. She appears well-developed and well-nourished.  HENT:  Head: Normocephalic.  Nose: Nose normal.  Mouth/Throat: Oropharynx is clear and moist.  Eyes: Conjunctivae are normal. Pupils are equal, round, and reactive to light.  Neck: Normal range of motion. Neck supple. No JVD present. Carotid bruit is present.  Cardiovascular: Normal rate, regular rhythm, S1 normal, S2 normal and intact distal pulses.  Exam reveals no gallop and no friction rub.   Murmur heard.  Systolic murmur is present with a grade of 2/6  Pulmonary/Chest: Effort normal and breath sounds normal. No respiratory distress. She has no wheezes. She has no rales. She exhibits no tenderness.  Abdominal: Soft. Bowel sounds are normal. She exhibits no distension. There is no tenderness.  Musculoskeletal: Normal range of motion. She exhibits no edema or tenderness.  Lymphadenopathy:  She has no cervical adenopathy.  Neurological: She is alert and oriented to person, place, and time. Coordination normal.  Skin: Skin is warm and dry. No rash noted. No erythema.  Psychiatric: She has a normal mood and affect. Her behavior is normal. Judgment and thought content normal.    Assessment and Plan  Nursing note and vitals reviewed.

## 2015-12-07 NOTE — Assessment & Plan Note (Signed)
She denies any episodes of chest tightness She does have coronary artery disease As she is asymptomatic, no further testing at this time, we'll treat medically

## 2015-12-07 NOTE — Assessment & Plan Note (Signed)
History of carotid stenosis by ultrasound in 2014  More prominent bruit on today's visit bilaterally  Repeat carotid ultrasound ordered

## 2015-12-07 NOTE — Assessment & Plan Note (Signed)
Appears relatively euvolemic on today's visit. She takes Lasix as needed

## 2015-12-07 NOTE — Assessment & Plan Note (Addendum)
Murmur on exam likely secondary to aortic valve sclerosis, unable to exclude mild stenosis   Total encounter time more than 25 minutes  Greater than 50% was spent in counseling and coordination of care with the patient

## 2015-12-07 NOTE — Assessment & Plan Note (Signed)
Significant aortic atherosclerosis noted on CT scan of the chest Including coronary artery disease Recommended aggressive cholesterol management. She reports that she is taking her lovastatin No recent lipid panel available

## 2015-12-07 NOTE — Assessment & Plan Note (Signed)
Blood pressure is well controlled on today's visit. No changes made to the medications. 

## 2015-12-07 NOTE — Assessment & Plan Note (Signed)
Recommended that she stay on her lovastatin. Ideally LDL should be less than 70

## 2015-12-14 ENCOUNTER — Encounter
Admission: RE | Admit: 2015-12-14 | Discharge: 2015-12-14 | Disposition: A | Discharge status: Home / Self Care | Payer: Medicare Other | Source: Ambulatory Visit | Attending: Internal Medicine | Admitting: Internal Medicine

## 2015-12-22 NOTE — Assessment & Plan Note (Signed)
Improved and almost back at her baseline.

## 2015-12-22 NOTE — Assessment & Plan Note (Signed)
Well controlled. Continue current medication.  

## 2015-12-22 NOTE — Assessment & Plan Note (Signed)
On higher dose advair, not sue if helped much.. continue for now, consider repeat spirometry at follow up.

## 2015-12-22 NOTE — Assessment & Plan Note (Signed)
Improved, refuses swallowing study at this time.

## 2015-12-27 ENCOUNTER — Other Ambulatory Visit: Payer: Self-pay | Admitting: Family Medicine

## 2016-01-09 ENCOUNTER — Ambulatory Visit: Payer: Medicare Other

## 2016-01-09 DIAGNOSIS — I6523 Occlusion and stenosis of bilateral carotid arteries: Secondary | ICD-10-CM | POA: Diagnosis not present

## 2016-01-09 DIAGNOSIS — R0989 Other specified symptoms and signs involving the circulatory and respiratory systems: Secondary | ICD-10-CM

## 2016-01-10 ENCOUNTER — Encounter: Payer: Self-pay | Admitting: Family Medicine

## 2016-01-10 ENCOUNTER — Encounter: Payer: Medicare Other | Admitting: Family Medicine

## 2016-01-10 ENCOUNTER — Ambulatory Visit (INDEPENDENT_AMBULATORY_CARE_PROVIDER_SITE_OTHER): Payer: Medicare Other | Admitting: Family Medicine

## 2016-01-10 VITALS — BP 124/64 | HR 60 | Temp 98.4°F | Ht 61.0 in | Wt 119.8 lb

## 2016-01-10 DIAGNOSIS — E7800 Pure hypercholesterolemia, unspecified: Secondary | ICD-10-CM | POA: Diagnosis not present

## 2016-01-10 DIAGNOSIS — Z0000 Encounter for general adult medical examination without abnormal findings: Secondary | ICD-10-CM | POA: Diagnosis not present

## 2016-01-10 DIAGNOSIS — M25561 Pain in right knee: Secondary | ICD-10-CM

## 2016-01-10 DIAGNOSIS — I10 Essential (primary) hypertension: Secondary | ICD-10-CM

## 2016-01-10 DIAGNOSIS — N184 Chronic kidney disease, stage 4 (severe): Secondary | ICD-10-CM | POA: Diagnosis not present

## 2016-01-10 DIAGNOSIS — D631 Anemia in chronic kidney disease: Secondary | ICD-10-CM

## 2016-01-10 DIAGNOSIS — Z7189 Other specified counseling: Secondary | ICD-10-CM

## 2016-01-10 DIAGNOSIS — R7303 Prediabetes: Secondary | ICD-10-CM

## 2016-01-10 LAB — COMPREHENSIVE METABOLIC PANEL
ALK PHOS: 52 U/L (ref 39–117)
ALT: 8 U/L (ref 0–35)
AST: 17 U/L (ref 0–37)
Albumin: 4.1 g/dL (ref 3.5–5.2)
BILIRUBIN TOTAL: 0.3 mg/dL (ref 0.2–1.2)
BUN: 38 mg/dL — AB (ref 6–23)
CO2: 27 meq/L (ref 19–32)
CREATININE: 1.67 mg/dL — AB (ref 0.40–1.20)
Calcium: 9.7 mg/dL (ref 8.4–10.5)
Chloride: 105 mEq/L (ref 96–112)
GFR: 30.22 mL/min — AB (ref >60.00)
GLUCOSE: 95 mg/dL (ref 70–99)
POTASSIUM: 4.6 meq/L (ref 3.5–5.1)
Sodium: 141 mEq/L (ref 135–145)
Total Protein: 7.3 g/dL (ref 6.0–8.3)

## 2016-01-10 LAB — CBC WITH DIFFERENTIAL/PLATELET
Basophils Absolute: 0.2 10*3/uL — ABNORMAL HIGH (ref 0.0–0.1)
Basophils Relative: 2.1 % (ref 0.0–3.0)
EOS PCT: 1.6 % (ref 0.0–5.0)
Eosinophils Absolute: 0.2 10*3/uL (ref 0.0–0.7)
HCT: 33.9 % — ABNORMAL LOW (ref 36.0–46.0)
Hemoglobin: 11.1 g/dL — ABNORMAL LOW (ref 12.0–15.0)
LYMPHS ABS: 1.9 10*3/uL (ref 0.7–4.0)
Lymphocytes Relative: 17.8 % (ref 12.0–46.0)
MCHC: 32.7 g/dL (ref 30.0–36.0)
MCV: 87.6 fl (ref 78.0–100.0)
MONO ABS: 1.1 10*3/uL — AB (ref 0.1–1.0)
Monocytes Relative: 10.2 % (ref 3.0–12.0)
NEUTROS ABS: 7.3 10*3/uL (ref 1.4–7.7)
NEUTROS PCT: 68.3 % (ref 43.0–77.0)
PLATELETS: 260 10*3/uL (ref 150.0–400.0)
RBC: 3.87 Mil/uL (ref 3.87–5.11)
RDW: 15.3 % (ref 11.5–15.5)
WBC: 10.7 10*3/uL — ABNORMAL HIGH (ref 4.0–10.5)

## 2016-01-10 LAB — VITAMIN D 25 HYDROXY (VIT D DEFICIENCY, FRACTURES): VITD: 41.05 ng/mL (ref 30.00–100.00)

## 2016-01-10 LAB — LIPID PANEL
CHOLESTEROL: 135 mg/dL (ref 0–200)
HDL: 40.9 mg/dL (ref >39.00)
LDL Cholesterol: 60 mg/dL (ref 0–99)
NONHDL: 93.99
Total CHOL/HDL Ratio: 3
Triglycerides: 168 mg/dL — ABNORMAL HIGH (ref 0.0–149.0)
VLDL: 33.6 mg/dL (ref 0.0–40.0)

## 2016-01-10 LAB — HEMOGLOBIN A1C: HEMOGLOBIN A1C: 5.6 % (ref 4.6–6.5)

## 2016-01-10 NOTE — Patient Instructions (Addendum)
Stop at lab on way out.  Use tramadol for pain.  Schedule appt wth Dr. Lorelei Pont for knee injection.

## 2016-01-10 NOTE — Assessment & Plan Note (Signed)
Due for re-eval. 

## 2016-01-10 NOTE — Progress Notes (Signed)
Pre visit review using our clinic review tool, if applicable. No additional management support is needed unless otherwise documented below in the visit note. 

## 2016-01-10 NOTE — Assessment & Plan Note (Addendum)
Due for re-eval. Eval secondary anemia and vit D.

## 2016-01-10 NOTE — Assessment & Plan Note (Signed)
No acute injury noted. Most likely OA flare. NSAIDs contraindicated given age, CV risk and CKD.  Refer to Dr. Loletha Grayer for repeat steroid injection.

## 2016-01-10 NOTE — Assessment & Plan Note (Signed)
Well controlled. Continue current medication.  

## 2016-01-10 NOTE — Progress Notes (Signed)
I have personally reviewed the Medicare Annual Wellness questionnaire and have noted 1. The patient's medical and social history 2. Their use of alcohol, tobacco or illicit drugs 3. Their current medications and supplements 4. The patient's functional ability including ADL's, fall risks, home safety risks and hearing or visual             impairment. 5. Diet and physical activities 6. Evidence for depression or mood disorders 7.         Updated provider list Cognitive evaluation was performed and recorded on pt medicare questionnaire form. The patients weight, height, BMI and visual acuity have been recorded in the chart  I have made referrals, counseling and provided education to the patient based review of the above and I have provided the pt with a written personalized care plan for preventive services.   Now [facility] now. Doing well over all there.  Right knee pain, worse in past few weeks hx of OA. NSAIDS contraindicated. In past steroid injection helped temporarily.  Tylenol does not help at all.  Tramadol helps some.   07/2015 Tricompartmental osteoarthritis  Hypertension: Well controlled on current medication  BP Readings from Last 3 Encounters:  01/10/16 124/64  12/07/15 122/50  11/18/15 130/42  Using medication without problems or lightheadedness: None  Chest pain with exertion:None  Edema:stable wearing compression hose.  Short of breath:yes, stable Average home BPs: 136/60-70 Other issues:   Eating well now at home place but continued weight loss. Not using ensure because cannot open. Daughter will fix this issue.  Wt Readings from Last 3 Encounters:  01/10/16 119 lb 12 oz (54.318 kg)  12/07/15 122 lb 8 oz (55.566 kg)  11/18/15 124 lb 8 oz (95.621 kg)    Diastolic CHF, euvolemic.. Followed by cardiology, Dr. Rockey Situ.  Elevated Cholesterol:  LDL previously at goal <70 on lovastatin 20 mg daily Lab Results  Component Value Date   CHOL 162 01/14/2015   HDL  59.80 01/14/2015   LDLCALC 72 01/14/2015   LDLDIRECT 122.1 08/17/2010   TRIG 150.0* 01/14/2015   CHOLHDL 3 01/14/2015  Using medications without problems:None  Muscle aches: None  Diet compliance: Good  Exercise:upper body stretches Other complaints:   Insomnia, off and on: 1-2 times a month. Does not nap.  Trouble falling asleep.  Denies depression, anxiety.  Gets up once a night to urinate.  Using tylenol PM one tablet a night... No SE. This helps minimally.    CKD, stable. Has not seen nephrology in a while. She is not interested in returning.   Prediabetes due for re-eval.   Unusual experience after waking up in chair yesterday.. Felt like she was in a box, but could move somewhat. Some items were upside down. TV, etc. Blinked a few times and it resolved. No other confusion, neuro changes etc.  No relation to tramadol or no new med.  Review of Systems  Constitutional: Negative for fever, fatigue and unexpected weight change. , no blood noted in stool, urine, vaginal etc.  HENT: Negative for ear pain, congestion, sore throat, sneezing, trouble swallowing and sinus pressure.  Dry mouth  Eyes: Negative for pain and itching.  Respiratory: see above. Cardiovascular: Negative for chest pain, palpitations and leg swelling.  Gastrointestinal: Negative for nausea, abdominal pain, diarrhea, constipation and blood in stool.  Genitourinary: Negative for dysuria, hematuria, vaginal discharge, difficulty urinating and menstrual problem. Occ vaginal irritation, using estrogen cream. Concerned about uti. Some urinary frequency, and increase in odor of urine.  Skin: Negative for rash.  Neurological: Negative for syncope, weakness, light-headedness, numbness and headaches.  Psychiatric/Behavioral: Negative for confusion and dysphoric mood. The patient is not nervous/anxious.  Objective:   Physical Exam  Constitutional: Vital signs are normal. She appears well-developed  and well-nourished. She is cooperative. Non-toxic appearance. She does not appear ill. No distress.  Elderly female in NAD  HENT:  Head: Normocephalic.  Right Ear: Hearing, tympanic membrane, external ear and ear canal normal. Tympanic membrane is not erythematous, not retracted and not bulging.  Left Ear: Hearing, tympanic membrane, external ear and ear canal normal. Tympanic membrane is not erythematous, not retracted and not bulging.  Nose: No mucosal edema or rhinorrhea. Right sinus exhibits no maxillary sinus tenderness and no frontal sinus tenderness. Left sinus exhibits no maxillary sinus tenderness and no frontal sinus tenderness.  Mouth/Throat: Uvula is midline, oropharynx is clear and moist and mucous membranes are normal.  Eyes: Conjunctivae, EOM and lids are normal. Pupils are equal, round, and reactive to light. No foreign bodies found.  Neck: Trachea normal and normal range of motion. Neck supple. Carotid bruit is not present. No mass and no thyromegaly present.  Cardiovascular: Normal rate, regular rhythm, S1 normal, S2 normal, normal heart sounds, intact distal pulses and normal pulses. Exam reveals no gallop and no friction rub.  No murmur heard.  Pulmonary/Chest: Effort normal. Not tachypneic. No respiratory distress. She has no decreased breath sounds. No wheeze, r/r. Abdominal: Soft. Normal appearance and bowel sounds are normal. There is no tenderness. There is no CVA tenderness.  Neurological: She is alert.  Skin: Skin is warm, dry and intact. No rash noted.  Psychiatric: Her speech is normal and behavior is normal. Judgment and thought content normal. Her mood appears not anxious. Cognition and memory are normal. She does not exhibit a depressed mood.  MSK: B ttp over joint line, medially on right knee, neg Mcmurray, neg ant post drawer, decreased ROM, no effusion but B enlargement of B knees. Assessment & Plan:   AMW: The patient's preventative maintenance and  recommended screening tests for an annual wellness exam were reviewed in full today.  Brought up to date unless services declined.  Counselled on the importance of diet, exercise, and its role in overall health and mortality.  The patient's FH and SH was reviewed, including their home life, tobacco status, and drug and alcohol status.   Vaccines: Uptodate with flu, Td, zoster PNA. prevnar. DXA: last in 2013 normal.  No indication for mammo, breast exam, pelvic/pap, colonoscopy at this age. nonsmoker

## 2016-01-10 NOTE — Assessment & Plan Note (Signed)
Has HCPOA and living will ( reviewed 2017)

## 2016-01-10 NOTE — Assessment & Plan Note (Signed)
On lovastatin, pt wishes to continue.

## 2016-01-11 DIAGNOSIS — R112 Nausea with vomiting, unspecified: Secondary | ICD-10-CM | POA: Diagnosis not present

## 2016-01-11 DIAGNOSIS — R100 Acute abdomen: Secondary | ICD-10-CM | POA: Diagnosis not present

## 2016-01-12 ENCOUNTER — Encounter: Payer: Self-pay | Admitting: *Deleted

## 2016-01-12 ENCOUNTER — Emergency Department: Payer: Medicare Other

## 2016-01-12 ENCOUNTER — Observation Stay
Admission: EM | Admit: 2016-01-12 | Discharge: 2016-01-13 | Disposition: A | Discharge status: Skilled Nursing Facility | Payer: Medicare Other | Attending: Internal Medicine | Admitting: Internal Medicine

## 2016-01-12 ENCOUNTER — Encounter: Payer: Self-pay | Admitting: Emergency Medicine

## 2016-01-12 DIAGNOSIS — M1990 Unspecified osteoarthritis, unspecified site: Secondary | ICD-10-CM | POA: Insufficient documentation

## 2016-01-12 DIAGNOSIS — N3941 Urge incontinence: Secondary | ICD-10-CM | POA: Diagnosis not present

## 2016-01-12 DIAGNOSIS — Z881 Allergy status to other antibiotic agents status: Secondary | ICD-10-CM | POA: Insufficient documentation

## 2016-01-12 DIAGNOSIS — D72829 Elevated white blood cell count, unspecified: Secondary | ICD-10-CM | POA: Diagnosis not present

## 2016-01-12 DIAGNOSIS — K219 Gastro-esophageal reflux disease without esophagitis: Secondary | ICD-10-CM | POA: Diagnosis not present

## 2016-01-12 DIAGNOSIS — N393 Stress incontinence (female) (male): Secondary | ICD-10-CM | POA: Insufficient documentation

## 2016-01-12 DIAGNOSIS — Z79899 Other long term (current) drug therapy: Secondary | ICD-10-CM | POA: Insufficient documentation

## 2016-01-12 DIAGNOSIS — Z801 Family history of malignant neoplasm of trachea, bronchus and lung: Secondary | ICD-10-CM | POA: Diagnosis not present

## 2016-01-12 DIAGNOSIS — I10 Essential (primary) hypertension: Secondary | ICD-10-CM | POA: Diagnosis present

## 2016-01-12 DIAGNOSIS — I6529 Occlusion and stenosis of unspecified carotid artery: Secondary | ICD-10-CM | POA: Insufficient documentation

## 2016-01-12 DIAGNOSIS — R0902 Hypoxemia: Secondary | ICD-10-CM | POA: Insufficient documentation

## 2016-01-12 DIAGNOSIS — R011 Cardiac murmur, unspecified: Secondary | ICD-10-CM | POA: Insufficient documentation

## 2016-01-12 DIAGNOSIS — D631 Anemia in chronic kidney disease: Secondary | ICD-10-CM | POA: Diagnosis not present

## 2016-01-12 DIAGNOSIS — R1013 Epigastric pain: Secondary | ICD-10-CM | POA: Diagnosis not present

## 2016-01-12 DIAGNOSIS — G4700 Insomnia, unspecified: Secondary | ICD-10-CM | POA: Diagnosis not present

## 2016-01-12 DIAGNOSIS — K921 Melena: Secondary | ICD-10-CM | POA: Insufficient documentation

## 2016-01-12 DIAGNOSIS — Z791 Long term (current) use of non-steroidal anti-inflammatories (NSAID): Secondary | ICD-10-CM | POA: Diagnosis not present

## 2016-01-12 DIAGNOSIS — M25561 Pain in right knee: Secondary | ICD-10-CM | POA: Insufficient documentation

## 2016-01-12 DIAGNOSIS — I5032 Chronic diastolic (congestive) heart failure: Secondary | ICD-10-CM | POA: Diagnosis not present

## 2016-01-12 DIAGNOSIS — I739 Peripheral vascular disease, unspecified: Secondary | ICD-10-CM | POA: Diagnosis not present

## 2016-01-12 DIAGNOSIS — J309 Allergic rhinitis, unspecified: Secondary | ICD-10-CM | POA: Diagnosis not present

## 2016-01-12 DIAGNOSIS — D62 Acute posthemorrhagic anemia: Secondary | ICD-10-CM | POA: Diagnosis not present

## 2016-01-12 DIAGNOSIS — N184 Chronic kidney disease, stage 4 (severe): Secondary | ICD-10-CM | POA: Diagnosis not present

## 2016-01-12 DIAGNOSIS — Z800 Family history of malignant neoplasm of digestive organs: Secondary | ICD-10-CM | POA: Diagnosis not present

## 2016-01-12 DIAGNOSIS — E7800 Pure hypercholesterolemia, unspecified: Secondary | ICD-10-CM | POA: Insufficient documentation

## 2016-01-12 DIAGNOSIS — G629 Polyneuropathy, unspecified: Secondary | ICD-10-CM | POA: Diagnosis not present

## 2016-01-12 DIAGNOSIS — R112 Nausea with vomiting, unspecified: Secondary | ICD-10-CM | POA: Diagnosis not present

## 2016-01-12 DIAGNOSIS — Z9889 Other specified postprocedural states: Secondary | ICD-10-CM | POA: Diagnosis not present

## 2016-01-12 DIAGNOSIS — E785 Hyperlipidemia, unspecified: Secondary | ICD-10-CM | POA: Insufficient documentation

## 2016-01-12 DIAGNOSIS — J449 Chronic obstructive pulmonary disease, unspecified: Secondary | ICD-10-CM | POA: Diagnosis not present

## 2016-01-12 DIAGNOSIS — Z9049 Acquired absence of other specified parts of digestive tract: Secondary | ICD-10-CM | POA: Insufficient documentation

## 2016-01-12 DIAGNOSIS — I130 Hypertensive heart and chronic kidney disease with heart failure and stage 1 through stage 4 chronic kidney disease, or unspecified chronic kidney disease: Secondary | ICD-10-CM | POA: Insufficient documentation

## 2016-01-12 DIAGNOSIS — Z7951 Long term (current) use of inhaled steroids: Secondary | ICD-10-CM | POA: Insufficient documentation

## 2016-01-12 DIAGNOSIS — Z7982 Long term (current) use of aspirin: Secondary | ICD-10-CM | POA: Diagnosis not present

## 2016-01-12 DIAGNOSIS — Z808 Family history of malignant neoplasm of other organs or systems: Secondary | ICD-10-CM | POA: Insufficient documentation

## 2016-01-12 DIAGNOSIS — Z880 Allergy status to penicillin: Secondary | ICD-10-CM | POA: Insufficient documentation

## 2016-01-12 DIAGNOSIS — Z833 Family history of diabetes mellitus: Secondary | ICD-10-CM | POA: Diagnosis not present

## 2016-01-12 DIAGNOSIS — K922 Gastrointestinal hemorrhage, unspecified: Secondary | ICD-10-CM | POA: Diagnosis not present

## 2016-01-12 DIAGNOSIS — Z87442 Personal history of urinary calculi: Secondary | ICD-10-CM | POA: Diagnosis not present

## 2016-01-12 DIAGNOSIS — I872 Venous insufficiency (chronic) (peripheral): Secondary | ICD-10-CM | POA: Insufficient documentation

## 2016-01-12 DIAGNOSIS — I447 Left bundle-branch block, unspecified: Secondary | ICD-10-CM | POA: Insufficient documentation

## 2016-01-12 DIAGNOSIS — Z818 Family history of other mental and behavioral disorders: Secondary | ICD-10-CM | POA: Diagnosis not present

## 2016-01-12 DIAGNOSIS — Z90710 Acquired absence of both cervix and uterus: Secondary | ICD-10-CM | POA: Diagnosis not present

## 2016-01-12 DIAGNOSIS — R1110 Vomiting, unspecified: Secondary | ICD-10-CM

## 2016-01-12 DIAGNOSIS — Z87891 Personal history of nicotine dependence: Secondary | ICD-10-CM | POA: Insufficient documentation

## 2016-01-12 DIAGNOSIS — Z8249 Family history of ischemic heart disease and other diseases of the circulatory system: Secondary | ICD-10-CM | POA: Insufficient documentation

## 2016-01-12 DIAGNOSIS — R9389 Abnormal findings on diagnostic imaging of other specified body structures: Secondary | ICD-10-CM

## 2016-01-12 DIAGNOSIS — I700 Atherosclerosis of aorta: Secondary | ICD-10-CM | POA: Insufficient documentation

## 2016-01-12 DIAGNOSIS — R109 Unspecified abdominal pain: Secondary | ICD-10-CM | POA: Diagnosis not present

## 2016-01-12 HISTORY — DX: Gastrointestinal hemorrhage, unspecified: K92.2

## 2016-01-12 LAB — COMPREHENSIVE METABOLIC PANEL
ALT: 11 U/L — ABNORMAL LOW (ref 14–54)
AST: 20 U/L (ref 15–41)
Albumin: 4.3 g/dL (ref 3.5–5.0)
Alkaline Phosphatase: 59 U/L (ref 38–126)
Anion gap: 10 (ref 5–15)
BILIRUBIN TOTAL: 0.6 mg/dL (ref 0.3–1.2)
BUN: 45 mg/dL — AB (ref 6–20)
CO2: 25 mmol/L (ref 22–32)
CREATININE: 1.81 mg/dL — AB (ref 0.44–1.00)
Calcium: 9.2 mg/dL (ref 8.9–10.3)
Chloride: 103 mmol/L (ref 101–111)
GFR calc Af Amer: 26 mL/min — ABNORMAL LOW (ref >60)
GFR, EST NON AFRICAN AMERICAN: 22 mL/min — AB (ref >60)
Glucose, Bld: 156 mg/dL — ABNORMAL HIGH (ref 65–99)
POTASSIUM: 4.1 mmol/L (ref 3.5–5.1)
Sodium: 138 mmol/L (ref 135–145)
TOTAL PROTEIN: 7.6 g/dL (ref 6.5–8.1)

## 2016-01-12 LAB — CBC
HCT: 30.1 % — ABNORMAL LOW (ref 35.0–47.0)
HCT: 29.6 % — ABNORMAL LOW (ref 35.0–47.0)
HEMATOCRIT: 28.5 % — AB (ref 35.0–47.0)
HEMATOCRIT: 38.2 % (ref 35.0–47.0)
HEMOGLOBIN: 9.8 g/dL — AB (ref 12.0–16.0)
HEMOGLOBIN: 9.3 g/dL — AB (ref 12.0–16.0)
Hemoglobin: 12.2 g/dL (ref 12.0–16.0)
Hemoglobin: 9.6 g/dL — ABNORMAL LOW (ref 12.0–16.0)
MCH: 28.2 pg (ref 26.0–34.0)
MCH: 28.3 pg (ref 26.0–34.0)
MCH: 28.5 pg (ref 26.0–34.0)
MCH: 28.7 pg (ref 26.0–34.0)
MCHC: 32 g/dL (ref 32.0–36.0)
MCHC: 32.3 g/dL (ref 32.0–36.0)
MCHC: 32.6 g/dL (ref 32.0–36.0)
MCHC: 32.7 g/dL (ref 32.0–36.0)
MCV: 86.4 fL (ref 80.0–100.0)
MCV: 87.5 fL (ref 80.0–100.0)
MCV: 88.3 fL (ref 80.0–100.0)
MCV: 88.9 fL (ref 80.0–100.0)
PLATELETS: 184 10*3/uL (ref 150–440)
PLATELETS: 200 10*3/uL (ref 150–440)
PLATELETS: 249 10*3/uL (ref 150–440)
Platelets: 176 10*3/uL (ref 150–440)
RBC: 3.3 MIL/uL — ABNORMAL LOW (ref 3.80–5.20)
RBC: 3.33 MIL/uL — AB (ref 3.80–5.20)
RBC: 3.44 MIL/uL — ABNORMAL LOW (ref 3.80–5.20)
RBC: 4.33 MIL/uL (ref 3.80–5.20)
RDW: 14.8 % — AB (ref 11.5–14.5)
RDW: 14.8 % — ABNORMAL HIGH (ref 11.5–14.5)
RDW: 14.9 % — AB (ref 11.5–14.5)
RDW: 14.9 % — AB (ref 11.5–14.5)
WBC: 10.3 10*3/uL (ref 3.6–11.0)
WBC: 19.6 10*3/uL — AB (ref 3.6–11.0)
WBC: 9 10*3/uL (ref 3.6–11.0)
WBC: 9.4 10*3/uL (ref 3.6–11.0)

## 2016-01-12 LAB — URINALYSIS COMPLETE WITH MICROSCOPIC (ARMC ONLY)
BACTERIA UA: NONE SEEN
Bilirubin Urine: NEGATIVE
Glucose, UA: NEGATIVE mg/dL
HGB URINE DIPSTICK: NEGATIVE
KETONES UR: NEGATIVE mg/dL
Leukocytes, UA: NEGATIVE
Nitrite: NEGATIVE
Protein, ur: 100 mg/dL — AB
SPECIFIC GRAVITY, URINE: 1.015 (ref 1.005–1.030)
SQUAMOUS EPITHELIAL / LPF: NONE SEEN
pH: 5 (ref 5.0–8.0)

## 2016-01-12 LAB — TYPE AND SCREEN
ABO/RH(D): A POS
ANTIBODY SCREEN: NEGATIVE

## 2016-01-12 LAB — ABO/RH: ABO/RH(D): A POS

## 2016-01-12 LAB — BASIC METABOLIC PANEL
Anion gap: 4 — ABNORMAL LOW (ref 5–15)
BUN: 41 mg/dL — AB (ref 6–20)
CALCIUM: 8.3 mg/dL — AB (ref 8.9–10.3)
CHLORIDE: 110 mmol/L (ref 101–111)
CO2: 25 mmol/L (ref 22–32)
CREATININE: 1.59 mg/dL — AB (ref 0.44–1.00)
GFR, EST AFRICAN AMERICAN: 30 mL/min — AB (ref >60)
GFR, EST NON AFRICAN AMERICAN: 26 mL/min — AB (ref >60)
Glucose, Bld: 104 mg/dL — ABNORMAL HIGH (ref 65–99)
Potassium: 4.3 mmol/L (ref 3.5–5.1)
SODIUM: 139 mmol/L (ref 135–145)

## 2016-01-12 LAB — PROTIME-INR
INR: 1.1
Prothrombin Time: 14.4 seconds (ref 11.4–15.0)

## 2016-01-12 LAB — MRSA PCR SCREENING: MRSA BY PCR: NEGATIVE

## 2016-01-12 LAB — TROPONIN I: Troponin I: <0.03 ng/mL (ref <0.031)

## 2016-01-12 LAB — LIPASE, BLOOD: Lipase: 18 U/L (ref 11–51)

## 2016-01-12 MED ORDER — PANTOPRAZOLE SODIUM 40 MG IV SOLR
40.0000 mg | Freq: Two times a day (BID) | INTRAVENOUS | Status: DC
  Administered 2016-01-12 – 2016-01-13 (×3): 40 mg via INTRAVENOUS
  Filled 2016-01-12 (×3): qty 40

## 2016-01-12 MED ORDER — FERROUS FUMARATE 324 (106 FE) MG PO TABS
1.0000 | ORAL_TABLET | Freq: Every day | ORAL | Status: DC
  Administered 2016-01-13: 106 mg via ORAL
  Filled 2016-01-12: qty 1

## 2016-01-12 MED ORDER — ONDANSETRON HCL 4 MG PO TABS: 4.0000 mg | ORAL_TABLET | Freq: Four times a day (QID) | ORAL | Status: DC | PRN

## 2016-01-12 MED ORDER — ONDANSETRON HCL 4 MG/2ML IJ SOLN: 4.0000 mg | Freq: Four times a day (QID) | INTRAMUSCULAR | Status: DC | PRN

## 2016-01-12 MED ORDER — HYDRALAZINE HCL 50 MG PO TABS
50.0000 mg | ORAL_TABLET | Freq: Three times a day (TID) | ORAL | Status: DC
  Administered 2016-01-12: 50 mg via ORAL
  Filled 2016-01-12: qty 1

## 2016-01-12 MED ORDER — SODIUM CHLORIDE 0.9% FLUSH
3.0000 mL | Freq: Two times a day (BID) | INTRAVENOUS | Status: DC
  Administered 2016-01-12 – 2016-01-13 (×2): 3 mL via INTRAVENOUS

## 2016-01-12 MED ORDER — FLUTICASONE PROPIONATE 50 MCG/ACT NA SUSP
2.0000 | Freq: Every day | NASAL | Status: DC
  Administered 2016-01-12 – 2016-01-13 (×2): 2 via NASAL
  Filled 2016-01-12: qty 16

## 2016-01-12 MED ORDER — MOMETASONE FURO-FORMOTEROL FUM 200-5 MCG/ACT IN AERO
2.0000 | INHALATION_SPRAY | Freq: Two times a day (BID) | RESPIRATORY_TRACT | Status: DC
  Administered 2016-01-12 – 2016-01-13 (×3): 2 via RESPIRATORY_TRACT
  Filled 2016-01-12: qty 8.8

## 2016-01-12 MED ORDER — TIOTROPIUM BROMIDE MONOHYDRATE 18 MCG IN CAPS
18.0000 ug | ORAL_CAPSULE | Freq: Every day | RESPIRATORY_TRACT | Status: DC
  Administered 2016-01-12 – 2016-01-13 (×2): 18 ug via RESPIRATORY_TRACT
  Filled 2016-01-12: qty 5

## 2016-01-12 MED ORDER — PANTOPRAZOLE SODIUM 40 MG IV SOLR
40.0000 mg | Freq: Once | INTRAVENOUS | Status: AC
  Administered 2016-01-12: 40 mg via INTRAVENOUS
  Filled 2016-01-12: qty 40

## 2016-01-12 MED ORDER — NEBIVOLOL HCL 5 MG PO TABS
10.0000 mg | ORAL_TABLET | Freq: Every day | ORAL | Status: DC
  Administered 2016-01-12 – 2016-01-13 (×2): 10 mg via ORAL
  Filled 2016-01-12 (×3): qty 2

## 2016-01-12 MED ORDER — HYDRALAZINE HCL 50 MG PO TABS: 100.0000 mg | ORAL_TABLET | Freq: Three times a day (TID) | ORAL | Status: DC

## 2016-01-12 MED ORDER — PRAVASTATIN SODIUM 20 MG PO TABS
20.0000 mg | ORAL_TABLET | Freq: Every day | ORAL | Status: DC
  Administered 2016-01-12: 20 mg via ORAL
  Filled 2016-01-12: qty 1

## 2016-01-12 MED ORDER — ACETAMINOPHEN 325 MG PO TABS
650.0000 mg | ORAL_TABLET | Freq: Four times a day (QID) | ORAL | Status: DC | PRN
  Administered 2016-01-13 (×2): 650 mg via ORAL
  Filled 2016-01-12 (×2): qty 2

## 2016-01-12 MED ORDER — AMLODIPINE BESYLATE 5 MG PO TABS
5.0000 mg | ORAL_TABLET | Freq: Every day | ORAL | Status: DC
  Administered 2016-01-12 – 2016-01-13 (×2): 5 mg via ORAL
  Filled 2016-01-12 (×3): qty 1

## 2016-01-12 MED ORDER — ALBUTEROL SULFATE (2.5 MG/3ML) 0.083% IN NEBU: 2.5000 mg | INHALATION_SOLUTION | Freq: Four times a day (QID) | RESPIRATORY_TRACT | Status: DC | PRN

## 2016-01-12 MED ORDER — GABAPENTIN 600 MG PO TABS
600.0000 mg | ORAL_TABLET | Freq: Every day | ORAL | Status: DC
  Administered 2016-01-12: 600 mg via ORAL
  Filled 2016-01-12: qty 1

## 2016-01-12 MED ORDER — OXYBUTYNIN CHLORIDE 5 MG PO TABS
5.0000 mg | ORAL_TABLET | Freq: Two times a day (BID) | ORAL | Status: DC
  Administered 2016-01-12 – 2016-01-13 (×3): 5 mg via ORAL
  Filled 2016-01-12 (×3): qty 1

## 2016-01-12 MED ORDER — SODIUM CHLORIDE 0.9 % IV SOLN
INTRAVENOUS | Status: AC
  Administered 2016-01-12: 10:00:00 via INTRAVENOUS

## 2016-01-12 MED ORDER — ACETAMINOPHEN 650 MG RE SUPP: 650.0000 mg | Freq: Four times a day (QID) | RECTAL | Status: DC | PRN

## 2016-01-12 MED ORDER — SODIUM CHLORIDE 0.9 % IV BOLUS (SEPSIS)
1000.0000 mL | Freq: Once | INTRAVENOUS | Status: AC
  Administered 2016-01-12: 1000 mL via INTRAVENOUS

## 2016-01-12 NOTE — Progress Notes (Signed)
[facility] [place] NAME: Elizabeth Silva    MR#:  [id number]  DATE OF BIRTH:  [birth year]  SUBJECTIVE:  CHIEF COMPLAINT:   Chief Complaint  Patient presents with  . Emesis  . Melena  No active bleeding, nausea or vomiting or diarrhea. NPO.  REVIEW OF SYSTEMS:  CONSTITUTIONAL: No fever, has weakness.  EYES: No blurred or double vision.  EARS, NOSE, AND THROAT: No tinnitus or ear pain.  RESPIRATORY: No cough, shortness of breath, wheezing or hemoptysis.  CARDIOVASCULAR: No chest pain, orthopnea, edema.  GASTROINTESTINAL: No nausea, vomiting, diarrhea or abdominal pain. No melena or bloody stool. GENITOURINARY: No dysuria, hematuria.  ENDOCRINE: No polyuria, nocturia,  HEMATOLOGY: No anemia, easy bruising or bleeding SKIN: No rash or lesion. MUSCULOSKELETAL: No joint pain or arthritis.   NEUROLOGIC: No tingling, numbness, weakness.  PSYCHIATRY: No anxiety or depression.   DRUG ALLERGIES:   Allergies  Allergen Reactions  . Levofloxacin     REACTION: Burning in legs  . Penicillins     REACTION: Rash    VITALS:  Blood pressure 128/34, pulse 64, temperature 98.9 F (37.2 C), temperature source Oral, resp. rate 18, height '5\' 1"'$  (1.549 m), weight 54.432 kg (120 lb), SpO2 92 %.  PHYSICAL EXAMINATION:  GENERAL:  82 y.o.-year-old patient lying in the bed with no acute distress.  EYES: Pupils equal, round, reactive to light and accommodation. No scleral icterus. Extraocular muscles intact.  HEENT: Head atraumatic, normocephalic. Oropharynx and nasopharynx clear.  NECK:  Supple, no jugular venous distention. No thyroid enlargement, no tenderness.  LUNGS: Normal breath sounds bilaterally, no wheezing, rales,rhonchi or crepitation. No use of accessory muscles of respiration.  CARDIOVASCULAR: S1, S2 normal. No murmurs, rubs, or gallops.  ABDOMEN: Soft, nontender, nondistended. Bowel sounds present. No organomegaly or mass.   EXTREMITIES: No pedal edema, cyanosis, or clubbing.  NEUROLOGIC: Cranial nerves II through XII are intact. Muscle strength 4/5 in all extremities. Sensation intact. Gait not checked.  PSYCHIATRIC: The patient is alert and oriented x 3.  SKIN: No obvious rash, lesion, or ulcer.    LABORATORY PANEL:   CBC  Recent Labs Lab 01/12/16 1340  WBC 9.4  HGB 9.6*  HCT 29.6*  PLT 184   ------------------------------------------------------------------------------------------------------------------  Chemistries   Recent Labs Lab 01/12/16 0022 01/12/16 0852  NA 138 139  K 4.1 4.3  CL 103 110  CO2 25 25  GLUCOSE 156* 104*  BUN 45* 41*  CREATININE 1.81* 1.59*  CALCIUM 9.2 8.3*  AST 20  --   ALT 11*  --   ALKPHOS 59  --   BILITOT 0.6  --    ------------------------------------------------------------------------------------------------------------------  Cardiac Enzymes  Recent Labs Lab 01/12/16 0022  TROPONINI <0.03   ------------------------------------------------------------------------------------------------------------------  RADIOLOGY:  Dg Chest Port 1 View  01/12/2016  CLINICAL DATA:  82 year old female with nausea vomiting. EXAM: PORTABLE CHEST 1 VIEW COMPARISON:  Chest CT dated 11/07/2015 and radiograph dated 10/14/2015 FINDINGS: There is an 18 mm hazy nodular density in the left lower lung field superimposed on the left fifth anterior rib corresponding to the nodule seen on the CT. A 22 mm hazy nodular density noted at the left lung base. There is a 2.7 x 3.9 cm confluent area of hazy density in the right lung base likely corresponding atelectatic changes noted on the CT in the right middle lobe. There is no pleural effusion or pneumothorax. Mild cardiomegaly. There is atherosclerotic calcification of the aortic arch. No acute  osseous pathology. IMPRESSION: Bibasilar hazy nodular densities as described corresponding to the nodules seen on the CT. No pleural effusion,  or pneumothorax. Electronically Signed   By: Anner Crete M.D.   On: 01/12/2016 01:35    EKG:   Orders placed or performed during the hospital encounter of 01/12/16  . EKG 12-Lead  . EKG 12-Lead  . ED EKG  . ED EKG    ASSESSMENT AND PLAN:   1. Nausea vomiting with associated upper abdominal pain. Improved. zofran prn. IVF. NPO.  2. GIB with Melena, anemia with acute blood loss. Hb decreased to 9.6 from 12.1. But no active bleeding since admission.  Per GI, - Monitor Hgb, transfuse if <7 - Continue IV Protonix '40mg'$  q 12hrs - Patient is a poor candidate for EGD d/t age and comorbidities  3. Abnormal chest x-ray with bilateral nodules. History of bilateral pulmonary nodules per CT done in January 2017, suspicious for malignancy but patient's family refused for further workup.   4. Leukocytosis, likely reactive. Improved.  5. Hypertension, stable on home medications.  6. COPD, stable on home medications.  7. CKD stage 4, stable clinically.    All the records are reviewed and case discussed with Care Management/Social Workerr. Management plans discussed with the patient, her daughter and they are in agreement.  CODE STATUS: full code.  TOTAL TIME TAKING CARE OF THIS PATIENT: 38 minutes.  Greater than 50% time was spent on coordination of care and face-to-face counseling.  POSSIBLE D/C IN 2 DAYS, DEPENDING ON CLINICAL CONDITION.   Demetrios Loll M.D on 01/12/2016 at 3:25 PM  Between 7am to 6pm - Pager - [phone]  After 6pm go to [email or web address] - password [email or web address] [facility]  [facility]  Office  [phone]  CC: Primary care physician; Eliezer Lofts, MD

## 2016-01-12 NOTE — NC FL2 (Signed)
[place] LEVEL OF CARE SCREENING TOOL     IDENTIFICATION  Patient Name: Elizabeth Silva Birthdate: 04-23-1918 Sex: female Admission Date (Current Location): 01/12/2016  [place] and [place] Number:  Engineering geologist and Address:  [facility], [address] [place], [place] [address]      Provider Number: [id number]  Attending Physician Name and Address:  Demetrios Loll, MD  Relative Name and Phone Number:       Current Level of Care: Hospital Recommended Level of Care: [place] Prior Approval Number:    Date Approved/Denied:   PASRR Number:  [id number])  Discharge Plan: SNF    Current Diagnoses: Patient Active Problem List   Diagnosis Date Noted  . Vomiting 01/12/2016  . Melena 01/12/2016  . Abnormal chest x-ray 01/12/2016  . Right knee pain 01/10/2016  . Advance directive discussed with patient 01/10/2016  . Dysphagia 10/27/2015  . Chest tightness 10/04/2015  . Vision loss, bilateral 10/26/2014  . Carotid stenosis 10/26/2014  . Bilateral tinnitus 04/20/2014  . Prediabetes 01/07/2014  . Bilateral knee pain 01/07/2014  . Hip pain, left 01/07/2014  . COPD exacerbation ([place]) 10/14/2013  . Fatigue 08/28/2013  . Insomnia 01/02/2013  . Anemia of chronic renal failure 12/25/2010  . CONSTIPATION 12/08/2010  . DIASTOLIC HEART FAILURE, CHRONIC 03/02/2010  . CAROTID BRUIT, RIGHT 03/02/2010  . INCONTINENCE, URGE 01/25/2010  . PERIPHERAL NEUROPATHY, IDIOPATHIC 05/10/2008  . DYSURIA, CHRONIC 02/27/2008  . PERIPHERAL VASCULAR DISEASE 05/05/2007  . Chronic venous insufficiency 05/05/2007  . ALLERGIC RHINITIS 05/05/2007  . COPD, mild ([place]) 05/05/2007  . GERD 05/05/2007  . DIVERTICULOSIS, COLON 05/05/2007  . Osteoarthritis, multiple sites 05/05/2007  . DEGENERATION, CERVICAL DISC 05/05/2007  . STRESS INCONTINENCE 05/05/2007  . HYPERCHOLESTEROLEMIA, PURE 04/07/2007  . RENAL STONE 04/07/2007  . Essential  hypertension, benign 03/17/2007  . Chronic kidney disease, stage 4, severely decreased GFR ([place]) 03/17/2007  . CARDIAC MURMUR 03/17/2007    Orientation RESPIRATION BLADDER Height & Weight     Self  O2 (4 Liters Oxygen ) Continent Weight: 120 lb (54.432 kg) Height:  '5\' 1"'$  (154.9 cm)  BEHAVIORAL SYMPTOMS/MOOD NEUROLOGICAL BOWEL NUTRITION STATUS   (none )  (none ) Continent Diet (NPO )  AMBULATORY STATUS COMMUNICATION OF NEEDS Skin   Extensive Assist Verbally Normal                       Personal Care Assistance Level of Assistance  Bathing, Feeding, Dressing Bathing Assistance: Limited assistance Feeding assistance: Independent Dressing Assistance: Limited assistance     Functional Limitations Info  Sight, Hearing, Speech Sight Info: Impaired Hearing Info: Adequate Speech Info: Impaired    SPECIAL CARE FACTORS FREQUENCY  PT (By licensed PT), OT (By licensed OT)     PT Frequency:  (5) OT Frequency:  (5)            Contractures      Additional Factors Info  Code Status, Allergies Code Status Info:  (Full Code. ) Allergies Info:  (Levofloxacin, Penicillins)           Current Medications (01/12/2016):  This is the current hospital active medication list Current Facility-Administered Medications  Medication Dose Route Frequency Provider Last Rate Last Dose  . 0.9 %  sodium chloride infusion   Intravenous Continuous Juluis Mire, MD 75 mL/hr at 01/12/16 0950    . acetaminophen (TYLENOL) tablet 650 mg  650 mg Oral Q6H PRN Juluis Mire, MD  Or  . acetaminophen (TYLENOL) suppository 650 mg  650 mg Rectal Q6H PRN Juluis Mire, MD      . albuterol (PROVENTIL) (2.5 MG/3ML) 0.083% nebulizer solution 2.5 mg  2.5 mg Nebulization Q6H PRN Juluis Mire, MD      . amLODipine (NORVASC) tablet 5 mg  5 mg Oral Daily Juluis Mire, MD   5 mg at 01/12/16 0950  . ferrous fumarate (HEMOCYTE - 106 mg FE) tablet 106 mg of iron  1 tablet Oral Daily Juluis Mire, MD   106 mg of iron at 01/12/16 0955  . fluticasone (FLONASE) 50 MCG/ACT nasal spray 2 spray  2 spray Each Nare Daily Juluis Mire, MD   2 spray at 01/12/16 [phone]  . gabapentin (NEURONTIN) tablet 600 mg  600 mg Oral QHS Juluis Mire, MD      . hydrALAZINE (APRESOLINE) tablet 100 mg  100 mg Oral 3 times per day Juluis Mire, MD      . mometasone-formoterol [facility]) 200-5 MCG/ACT inhaler 2 puff  2 puff Inhalation BID Juluis Mire, MD   2 puff at 01/12/16 0955  . nebivolol (BYSTOLIC) tablet 10 mg  10 mg Oral Daily Juluis Mire, MD   10 mg at 01/12/16 0954  . ondansetron (ZOFRAN) tablet 4 mg  4 mg Oral Q6H PRN Juluis Mire, MD       Or  . ondansetron [facility]) injection 4 mg  4 mg Intravenous Q6H PRN Juluis Mire, MD      . oxybutynin (DITROPAN) tablet 5 mg  5 mg Oral BID Juluis Mire, MD   5 mg at 01/12/16 0950  . pantoprazole (PROTONIX) injection 40 mg  40 mg Intravenous Q12H Juluis Mire, MD   40 mg at 01/12/16 0950  . pravastatin (PRAVACHOL) tablet 20 mg  20 mg Oral q1800 Juluis Mire, MD      . sodium chloride flush (NS) 0.9 % injection 3 mL  3 mL Intravenous Q12H Juluis Mire, MD      . tiotropium [facility]) inhalation capsule 18 mcg  18 mcg Inhalation Daily Juluis Mire, MD   18 mcg at 01/12/16 8377     Discharge Medications: Please see discharge summary for a list of discharge medications.  Relevant Imaging Results:  Relevant Lab Results:   Additional Information  (SSN: [id number])  Loralyn Freshwater, LCSW

## 2016-01-12 NOTE — ED Provider Notes (Signed)
[facility] Emergency Department Provider Note  ____________________________________________  Time seen: Approximately 2:22 AM  I have reviewed the triage vital signs and the nursing notes.   HISTORY  Chief Complaint Emesis and Melena    HPI Elizabeth Silva is a 82 y.o. female sent to the ED from nursing home via EMS with a chief complaint of vomiting and melena for the past 3 hours.Patient received 4 mg IV Zofran per EMS. Patient reports vomiting and 2-3 episodes of black, tarry stools which she thought was secondary to her iron tablets. Patient initially reported lower abdominal pain, but no pain currently. Room air oxygen saturations were 87% on arrival. Patient denies recent fever, chills, chest pain, shortness of breath, diarrhea. Denies use of anticoagulants. Nothing makes her symptoms worse; IV Zofran made her vomiting better.   Past Medical History  Diagnosis Date  . Allergic rhinitis   . COPD (chronic obstructive pulmonary disease) ([place])   . Diverticulosis of colon   . GERD (gastroesophageal reflux disease)   . Hyperlipidemia   . Hypertension     LE swelling with amlodipine, intolerant of ACEIs  . Osteoarthritis   . CKD (chronic kidney disease)   . Impaired vision     right eye, was told due to HTN  . LBBB (left bundle branch block)   . Diastolic CHF, acute (HCC)     echo (4/11) with EF 60-65%,mild LVH, mild aortic insufficiency, mild MR, PA systolic pressure 16-10 mmHg, mild to moderate TR  . History of PFTs   . H/O Clostridium difficile infection Sept. 2013    Patient Active Problem List   Diagnosis Date Noted  . Right knee pain 01/10/2016  . Advance directive discussed with patient 01/10/2016  . Dysphagia 10/27/2015  . Chest tightness 10/04/2015  . Vision loss, bilateral 10/26/2014  . Carotid stenosis 10/26/2014  . Bilateral tinnitus 04/20/2014  . Prediabetes 01/07/2014  . Bilateral knee pain 01/07/2014  . Hip pain, left 01/07/2014   . COPD exacerbation ([place]) 10/14/2013  . Fatigue 08/28/2013  . Insomnia 01/02/2013  . Anemia of chronic renal failure 12/25/2010  . CONSTIPATION 12/08/2010  . DIASTOLIC HEART FAILURE, CHRONIC 03/02/2010  . CAROTID BRUIT, RIGHT 03/02/2010  . INCONTINENCE, URGE 01/25/2010  . PERIPHERAL NEUROPATHY, IDIOPATHIC 05/10/2008  . DYSURIA, CHRONIC 02/27/2008  . PERIPHERAL VASCULAR DISEASE 05/05/2007  . Chronic venous insufficiency 05/05/2007  . ALLERGIC RHINITIS 05/05/2007  . COPD, mild ([place]) 05/05/2007  . GERD 05/05/2007  . DIVERTICULOSIS, COLON 05/05/2007  . Osteoarthritis, multiple sites 05/05/2007  . DEGENERATION, CERVICAL DISC 05/05/2007  . STRESS INCONTINENCE 05/05/2007  . HYPERCHOLESTEROLEMIA, PURE 04/07/2007  . RENAL STONE 04/07/2007  . Essential hypertension, benign 03/17/2007  . Chronic kidney disease, stage 4, severely decreased GFR ([place]) 03/17/2007  . CARDIAC MURMUR 03/17/2007    Past Surgical History  Procedure Laterality Date  . Cardiac catheterization  1999    normal  . Total abdominal hysterectomy  1970    suspicious cells  . Cholecystectomy  1980  . Cervical disc surgery  2005    cerv. disc, dz. C5-C7, facet arthritis    Current Outpatient Rx  Name  Route  Sig  Dispense  Refill  . acetaminophen (TYLENOL) 500 MG tablet   Oral   Take 500 mg by mouth every 6 (six) hours as needed.           Marland Kitchen acidophilus (RISAQUAD) CAPS capsule   Oral   Take 1 capsule by mouth daily.         Marland Kitchen  ADVAIR DISKUS 250-50 MCG/DOSE AEPB      Inhale 1 puff into the  lungs two times daily   180 each   1   . albuterol (PROAIR HFA) 108 (90 BASE) MCG/ACT inhaler   Inhalation   Inhale 2 puffs into the lungs every 6 (six) hours as needed for wheezing or shortness of breath.   3 Inhaler   1   . amLODipine (NORVASC) 5 MG tablet      Take 1 tablet by mouth two  times daily   180 tablet   1   . aspirin 81 MG EC tablet   Oral   Take 81 mg by mouth daily.           Marland Kitchen BYSTOLIC  10 MG tablet      Take 1 tablet by mouth  daily   90 tablet   1   . ferrous fumarate (HEMOCYTE - 106 MG FE) 325 (106 FE) MG TABS tablet   Oral   Take 1 tablet (106 mg of iron total) by mouth daily.   90 each   3   . fluticasone (FLONASE) 50 MCG/ACT nasal spray   Each Nare   Place 2 sprays into both nostrils daily. Patient taking differently: Place 2 sprays into both nostrils as needed.    48 g   3   . furosemide (LASIX) 20 MG tablet   Oral   Take 1 tablet (20 mg total) by mouth daily as needed.   90 tablet   3   . gabapentin (NEURONTIN) 600 MG tablet      Take 1 tablet by mouth at  bedtime   90 tablet   3   . hydrALAZINE (APRESOLINE) 100 MG tablet      Take 1 tablet by mouth 3  times a day   270 tablet   1   . lovastatin (MEVACOR) 20 MG tablet   Oral   Take 1 tablet (20 mg total) by mouth at bedtime.   90 tablet   3   . omeprazole (PRILOSEC) 40 MG capsule      Take 1 capsule by mouth  daily Patient taking differently: Take 40 mg by mouth as needed. Take 1 capsule by mouth  daily   90 capsule   1   . oxybutynin (DITROPAN) 5 MG tablet      Take 1 tablet by mouth two  times daily   180 tablet   1   . SPIRIVA HANDIHALER 18 MCG inhalation capsule      Inhale the contents of 1  capsule via HandiHaler  daily   90 capsule   1   . traMADol (ULTRAM) 50 MG tablet   Oral   Take 1 tablet (50 mg total) by mouth 2 (two) times daily as needed for moderate pain.   180 tablet   0   . vitamin E 1000 UNIT capsule   Oral   Take 1,000 Units by mouth daily.             Allergies Levofloxacin and Penicillins  Family History  Problem Relation Age of Onset  . Heart block Mother     complete  . Dementia Father   . Colon cancer Sister   . Dementia Sister   . Lung cancer Brother   . Cancer Brother     sinus  . Diabetes Brother     Social History Social History  Substance Use Topics  . Smoking status: Former Smoker --  1.00 packs/day    Types:  Cigarettes    Quit date: 10/15/1984  . Smokeless tobacco: Never Used  . Alcohol Use: 0.0 oz/week    0 Standard drinks or equivalent per week    Review of Systems Constitutional: No fever/chills. Eyes: No visual changes. ENT: No sore throat. Cardiovascular: Denies chest pain. Respiratory: Denies shortness of breath. Gastrointestinal: Positive for lower abdominal pain, nausea, vomiting and dark, tarry stools.  No diarrhea.  No constipation. Genitourinary: Negative for dysuria. Musculoskeletal: Negative for back pain. Skin: Negative for rash. Neurological: Negative for headaches, focal weakness or numbness.  10-point ROS otherwise negative.  ____________________________________________   PHYSICAL EXAM:  VITAL SIGNS: ED Triage Vitals  Enc Vitals Group     BP 01/12/16 0012 176/61 mmHg     Pulse Rate 01/12/16 0012 84     Resp 01/12/16 0012 20     Temp 01/12/16 0012 98.1 F (36.7 C)     Temp Source 01/12/16 0012 Oral     SpO2 01/12/16 0012 87 %     Weight 01/12/16 0012 123 lb 3.8 oz (55.9 kg)     Height 01/12/16 0012 '5\' 1"'$  (1.549 m)     Head Cir --      Peak Flow --      Pain Score 01/12/16 0016 0     Pain Loc --      Pain Edu? --      Excl. in [place]? --     Constitutional: Alert and oriented. Pale appearing and in no acute distress. Eyes: Conjunctivae are normal. PERRL. EOMI. Head: Atraumatic. Nose: No congestion/rhinnorhea. Mouth/Throat: Mucous membranes are moist.  Oropharynx non-erythematous. Neck: No stridor.   Cardiovascular: Normal rate, regular rhythm. Grossly normal heart sounds.  Good peripheral circulation. Respiratory: Normal respiratory effort.  No retractions. Lungs CTAB. Gastrointestinal: Soft and nontender. No distention. No abdominal bruits. No CVA tenderness. Musculoskeletal: No lower extremity tenderness nor edema.  No joint effusions. Neurologic:  Normal speech and language. No gross focal neurologic deficits are appreciated.  Skin:  Skin is warm,  dry and intact. No rash noted. Psychiatric: Mood and affect are normal. Speech and behavior are normal.  ____________________________________________   LABS (all labs ordered are listed, but only abnormal results are displayed)  Labs Reviewed  COMPREHENSIVE METABOLIC PANEL - Abnormal; Notable for the following:    Glucose, Bld 156 (*)    BUN 45 (*)    Creatinine, Ser 1.81 (*)    ALT 11 (*)    GFR calc non Af Amer 22 (*)    GFR calc Af Amer 26 (*)    All other components within normal limits  CBC - Abnormal; Notable for the following:    WBC 19.6 (*)    RDW 14.8 (*)    All other components within normal limits  URINALYSIS COMPLETEWITH MICROSCOPIC ([facility] ONLY) - Abnormal; Notable for the following:    Color, Urine YELLOW (*)    APPearance CLEAR (*)    Protein, ur 100 (*)    All other components within normal limits  LIPASE, BLOOD  PROTIME-INR  TROPONIN I  TYPE AND SCREEN  ABO/RH   ____________________________________________  EKG  ED ECG REPORT I, Justyn Langham J, the attending physician, personally viewed and interpreted this ECG.   Date: 01/12/2016  EKG Time: 0012  Rate: 85  Rhythm: normal EKG, normal sinus rhythm  Axis: LAD  Intervals:left bundle branch block  ST&T Change: Nonspecific  ____________________________________________  RADIOLOGY  Portable chest x-ray (viewed by me,  interpreted per Dr. Quintella Reichert): Bibasilar hazy nodular densities as described corresponding to the nodules seen on the CT. No pleural effusion, or pneumothorax. ____________________________________________   PROCEDURES  Procedure(s) performed:   Rectal exam: External exam WNL. Dark, tarry stool obtained on gloved finger. Heme +.   Critical Care performed: No  ____________________________________________   INITIAL IMPRESSION / ASSESSMENT AND PLAN / ED COURSE  Pertinent labs & imaging results that were available during my care of the patient were reviewed by me and considered in  my medical decision making (see chart for details).  82 year old female who presents with vomiting, melena and hypoxia. Initial H and H stable. Noted leukocytosis with bibasilar hazy nodular densities on chest x-ray. This is stable compared to prior chest CT. Leukocytosis cytosis likely secondary to stress reaction as patient does not complain of fever or cough. IV Protonix ordered for suspected GI bleed. Will discuss with hospitalist for admission. ____________________________________________   FINAL CLINICAL IMPRESSION(S) / ED DIAGNOSES  Final diagnoses:  Upper GI bleed  Melena  Hypoxia      Paulette Blanch, MD 01/12/16 [phone]

## 2016-01-12 NOTE — Clinical Social Work Note (Signed)
Clinical Social Work Assessment  Patient Details  Name: Elizabeth Silva MRN: [id number] Date of Birth: [birth year]  Date of referral:  01/12/16               Reason for consult:  Facility Placement, Other (Comment Required) ([facility] is from [facility] independent living. )                Permission sought to share information with:  Chartered certified accountant granted to share information::  Yes, Verbal Permission Granted  Name::      [place]::   [place]   Relationship::     Contact Information:     Housing/Transportation Living arrangements for the past 2 months:  [place] of Information:  Patient, Adult Children, Facility Patient Interpreter Needed:  None Criminal Activity/Legal Involvement Pertinent to Current Situation/Hospitalization:  No - Comment as needed Significant Relationships:  Adult Children Lives with:  Self Do you feel safe going back to the place where you live?  Yes Need for family participation in patient care:  Yes (Comment)  Care giving concerns:  Patient is an independent living resident at [facility].    Social Worker assessment / plan:  Holiday representative (CSW) received consult that patient is from a facility. CSW attempted to meet with patient however she was asleep. CSW contacted [facility]. Per Horris Latino patient is completely independent and stays in the independent living section. Per Horris Latino patient can return if appropriate. PT is pending. CSW contacted patient's daughter Elizabeth Silva [phone]. Per daughter patient has been at [facility] since January 2017. Daughter prefers for patient to return to independent living however is agreeable to SNF if needed. Daughter prefers [employer]   FL2 complete and faxed out. CSW will continue to follow and assist as needed.   Employment status:  Disabled (Comment on whether or not currently receiving Disability),  Retired Nurse, adult PT Recommendations:  Not assessed at this time Information / Referral to community resources:  [place], Other (Comment Required) ([place] VS SNF )  Patient/Family's Response to care:  Patient's daughter prefers for patient to go back to independent living but is agreeable to SNF.   Patient/Family's Understanding of and Emotional Response to Diagnosis, Current Treatment, and Prognosis:  Daughter was pleasant and thanked CSW for calling.   Emotional Assessment Appearance:  Appears stated age Attitude/Demeanor/Rapport:  Lethargic Affect (typically observed):  Pleasant Orientation:  Oriented to Self, Fluctuating Orientation (Suspected and/or reported Sundowners), Oriented to Place Alcohol / Substance use:  Not Applicable Psych involvement (Current and /or in the community):  No (Comment)  Discharge Needs  Concerns to be addressed:  Discharge Planning Concerns Readmission within the last 30 days:  No Current discharge risk:  Chronically ill Barriers to Discharge:  Continued Medical Work up   Loralyn Freshwater, LCSW 01/12/2016, 11:50 AM

## 2016-01-12 NOTE — H&P (Signed)
[facility] [place]    MR#:  [id number]  DATE OF BIRTH:  Aug 11, 1918  DATE OF ADMISSION:  01/12/2016  PRIMARY CARE PHYSICIAN: Eliezer Lofts, MD   REQUESTING/REFERRING PHYSICIAN: Beather Arbour  CHIEF COMPLAINT:   Chief Complaint  Patient presents with  . Emesis  . Melena    HISTORY OF PRESENT ILLNESS:  Elizabeth Silva  is a 82 y.o. female with a known history of hypertension, COPD, CK D stage III brought in with the complaints of sudden onset of nausea vomiting with upper abdominal pain which started last night. Patient's daughter states that she was doing absolutely well yesterday morning and had a checkup with her primary care practitioner for a routine office visit. Later in the night assisted living facility staff noted patient having vomiting with upper abdominal pain, also noted to have melena, and sent ED for further evaluation. Patient received antiemetic medication by the EMS.  Evaluation in the ED revealed stable vital signs and afebrile. Lab work revealed WBC of 19.6, H&H 12.2/38, BUN/creatinine 45/1.81. LFTs normal. Troponin less than 0.03. Chest x-ray bilateral basilar nodules. EKG normal sinus rhythm with ventricular rate of 80 bpm. Rectal examination performed by the ED physician revealed guaiac positive stools. Hospitalist service was consulted for further management.  At the current time patient resting in bed comfortably, states did not have any further episodes of vomiting after coming to the hospital. She denies any chest pain, palpitations, dizziness, cough, recent fever or illness. According to the patient's daughter, she was diagnosed to have bilateral pulmonary nodules per CT chest done in January 2017 which was suspicious for possible malignancy, but patient's family decided not to have any further workup. Patient denies history of chronic NSAID use. Denies any diarrhea.  PAST MEDICAL HISTORY:   Past Medical  History  Diagnosis Date  . Allergic rhinitis   . COPD (chronic obstructive pulmonary disease) ([place])   . Diverticulosis of colon   . GERD (gastroesophageal reflux disease)   . Hyperlipidemia   . Hypertension     LE swelling with amlodipine, intolerant of ACEIs  . Osteoarthritis   . CKD (chronic kidney disease)   . Impaired vision     right eye, was told due to HTN  . LBBB (left bundle branch block)   . Diastolic CHF, acute (HCC)     echo (4/11) with EF 60-65%,mild LVH, mild aortic insufficiency, mild MR, PA systolic pressure 93-71 mmHg, mild to moderate TR  . History of PFTs   . H/O Clostridium difficile infection Sept. 2013    PAST SURGICAL HISTORY:   Past Surgical History  Procedure Laterality Date  . Cardiac catheterization  1999    normal  . Total abdominal hysterectomy  1970    suspicious cells  . Cholecystectomy  1980  . Cervical disc surgery  2005    cerv. disc, dz. C5-C7, facet arthritis    SOCIAL HISTORY:   Social History  Substance Use Topics  . Smoking status: Former Smoker -- 1.00 packs/day    Types: Cigarettes    Quit date: 10/15/1984  . Smokeless tobacco: Never Used  . Alcohol Use: 0.0 oz/week    0 Standard drinks or equivalent per week    FAMILY HISTORY:   Family History  Problem Relation Age of Onset  . Heart block Mother     complete  . Dementia Father   . Colon cancer Sister   . Dementia Sister   .  Lung cancer Brother   . Cancer Brother     sinus  . Diabetes Brother     DRUG ALLERGIES:   Allergies  Allergen Reactions  . Levofloxacin     REACTION: Burning in legs  . Penicillins     REACTION: Rash    REVIEW OF SYSTEMS:   Review of Systems  Constitutional: Negative.  Negative for fever, chills, weight loss and malaise/fatigue.  HENT: Negative.   Eyes: Negative.   Respiratory: Negative.  Negative for cough, hemoptysis and shortness of breath.   Cardiovascular: Negative.  Negative for chest pain and palpitations.   Gastrointestinal: Positive for nausea, vomiting, abdominal pain and melena. Negative for diarrhea.  Genitourinary: Negative.   Musculoskeletal: Negative.   Skin: Negative.  Negative for rash.  Neurological: Negative.  Negative for dizziness, sensory change, focal weakness and headaches.    MEDICATIONS AT HOME:   Prior to Admission medications   Medication Sig Start Date End Date Taking? Authorizing Provider  acetaminophen (TYLENOL) 500 MG tablet Take 500 mg by mouth every 6 (six) hours as needed.     Yes Historical Provider, MD  acidophilus (RISAQUAD) CAPS capsule Take 1 capsule by mouth daily.   Yes Historical Provider, MD  ADVAIR DISKUS 250-50 MCG/DOSE AEPB Inhale 1 puff into the  lungs two times daily 12/27/15  Yes Amy E Bedsole, MD  albuterol (PROAIR HFA) 108 (90 BASE) MCG/ACT inhaler Inhale 2 puffs into the lungs every 6 (six) hours as needed for wheezing or shortness of breath. 12/29/13  Yes Amy Cletis Athens, MD  amLODipine (NORVASC) 5 MG tablet Take 1 tablet by mouth two  times daily 11/08/15  Yes Amy E Bedsole, MD  aspirin 81 MG EC tablet Take 81 mg by mouth daily.     Yes Historical Provider, MD  BYSTOLIC 10 MG tablet Take 1 tablet by mouth  daily 11/08/15  Yes Amy E Bedsole, MD  ferrous fumarate (HEMOCYTE - 106 MG FE) 325 (106 FE) MG TABS tablet Take 1 tablet (106 mg of iron total) by mouth daily. 10/27/13  Yes Amy E Diona Browner, MD  fluticasone (FLONASE) 50 MCG/ACT nasal spray Place 2 sprays into both nostrils daily. Patient taking differently: Place 2 sprays into both nostrils as needed.  04/20/14  Yes Amy Cletis Athens, MD  furosemide (LASIX) 20 MG tablet Take 1 tablet (20 mg total) by mouth daily as needed. 10/27/13  Yes Amy Cletis Athens, MD  gabapentin (NEURONTIN) 600 MG tablet Take 1 tablet by mouth at  bedtime 07/12/15  Yes Amy E Bedsole, MD  hydrALAZINE (APRESOLINE) 100 MG tablet Take 1 tablet by mouth 3  times a day 10/18/15  Yes Amy E Bedsole, MD  lovastatin (MEVACOR) 20 MG tablet Take 1 tablet  (20 mg total) by mouth at bedtime. 04/26/15  Yes Minna Merritts, MD  omeprazole (PRILOSEC) 40 MG capsule Take 1 capsule by mouth  daily Patient taking differently: Take 40 mg by mouth as needed. Take 1 capsule by mouth  daily 04/18/15  Yes Amy E Diona Browner, MD  oxybutynin (DITROPAN) 5 MG tablet Take 1 tablet by mouth two  times daily 10/18/15  Yes Amy Cletis Athens, MD  SPIRIVA HANDIHALER 18 MCG inhalation capsule Inhale the contents of 1  capsule via HandiHaler  daily 12/05/15  Yes Amy E Bedsole, MD  traMADol (ULTRAM) 50 MG tablet Take 1 tablet (50 mg total) by mouth 2 (two) times daily as needed for moderate pain. 11/18/15  Yes Amy Cletis Athens, MD  vitamin E 1000 UNIT capsule Take 1,000 Units by mouth daily.     Yes Historical Provider, MD      VITAL SIGNS:  Blood pressure 129/46, pulse 76, temperature 98.1 F (36.7 C), temperature source Oral, resp. rate 28, height '5\' 1"'$  (1.549 m), weight 55.9 kg (123 lb 3.8 oz), SpO2 97 %.  PHYSICAL EXAMINATION:  Physical Exam  Constitutional: She is oriented to person, place, and time. She appears well-developed and well-nourished.  HENT:  Head: Normocephalic and atraumatic.  Nose: Nose normal.  Mouth/Throat: Oropharynx is clear and moist. No oropharyngeal exudate.  Eyes: Conjunctivae and EOM are normal. Pupils are equal, round, and reactive to light. Right eye exhibits no discharge.  Neck: Normal range of motion. Neck supple.  Cardiovascular: Normal rate, regular rhythm and normal heart sounds.   Respiratory: Effort normal and breath sounds normal. No respiratory distress. She has no wheezes. She has no rales.  GI: Soft. Bowel sounds are normal. She exhibits no distension. There is no tenderness.  Musculoskeletal: Normal range of motion.  Neurological: She is alert and oriented to person, place, and time. She has normal reflexes.  Skin: Skin is warm.  Psychiatric: She has a normal mood and affect. Her behavior is normal.   LABORATORY PANEL:   CBC  Recent  Labs Lab 01/12/16 0022  WBC 19.6*  HGB 12.2  HCT 38.2  PLT 249   ------------------------------------------------------------------------------------------------------------------  Chemistries   Recent Labs Lab 01/12/16 0022  NA 138  K 4.1  CL 103  CO2 25  GLUCOSE 156*  BUN 45*  CREATININE 1.81*  CALCIUM 9.2  AST 20  ALT 11*  ALKPHOS 59  BILITOT 0.6   ------------------------------------------------------------------------------------------------------------------  Cardiac Enzymes  Recent Labs Lab 01/12/16 0022  TROPONINI <0.03   ------------------------------------------------------------------------------------------------------------------  RADIOLOGY:  Dg Chest Port 1 View  01/12/2016  CLINICAL DATA:  82 year old female with nausea vomiting. EXAM: PORTABLE CHEST 1 VIEW COMPARISON:  Chest CT dated 11/07/2015 and radiograph dated 10/14/2015 FINDINGS: There is an 18 mm hazy nodular density in the left lower lung field superimposed on the left fifth anterior rib corresponding to the nodule seen on the CT. A 22 mm hazy nodular density noted at the left lung base. There is a 2.7 x 3.9 cm confluent area of hazy density in the right lung base likely corresponding atelectatic changes noted on the CT in the right middle lobe. There is no pleural effusion or pneumothorax. Mild cardiomegaly. There is atherosclerotic calcification of the aortic arch. No acute osseous pathology. IMPRESSION: Bibasilar hazy nodular densities as described corresponding to the nodules seen on the CT. No pleural effusion, or pneumothorax. Electronically Signed   By: Anner Crete M.D.   On: 01/12/2016 01:35    EKG:   Orders placed or performed during the hospital encounter of 01/12/16  . EKG 12-Lead normal sinus rhythm with ventricular of 80 bpm, LBBB   . EKG 12-Lead  . ED EKG  . ED EKG    IMPRESSION AND PLAN:   1. Nausea vomiting with associated upper abdominal pain. 2. Melena,reported by  assisted living facility staff, guaiac positive per ED physician's note. UGI bleed. Rule out peptic ulcer disease. H&H normal, patient is hemodynamically stable. 3. Abnormal chest x-ray with bilateral nodules. History of bilateral pulmonary nodules per CT done in January 2017, suspicious for malignancy but patient's family refused for further workup.  4. Leukocytosis, likely reactive. No history of fever, patient not in a sick. 5. Hypertension, stable on home medications. 6.  COPD, stable on home medications. 7. CK D, creatinine 1.81, stable clinically.  Plan: Admit to telemetry for observation with, nothing by mouth, close hemodynamic monitoring, continue IV Protonix. Serial H&H monitoring. GI consultation requested for further evaluation advice.         Monitor for temperature, follow-up CBC. Consider further workup if WBC remains elevated.         Gentle IV hydration, avoid nephrotoxic agents, follow-up BMP.         Continue home medications for hypertension and COPD.      All the records are reviewed and case discussed with ED provider. Management plans discussed with the patient, family and they are in agreement.  CODE STATUS: Full code  TOTAL TIME TAKING CARE OF THIS PATIENT: 50 minutes.    Azucena Freed N M.D on 01/12/2016 at 4:56 AM  Between 7am to 6pm - Pager - [phone]  After 6pm go to [email or web address] - password [email or web address] [facility]  [facility]  Office  [phone]  CC: Primary care physician; Eliezer Lofts, MD

## 2016-01-12 NOTE — ED Notes (Signed)
Pt to rm 9 via EMS from [facility] [place].  EMS report pt vomiting and melena x 3 hours.  EMS gave 4 zofran IV.  Pt reports vomiting continuously, and 2-3 episodes of black, tarry stools.  Pt reports abd pain with BMs, but no current pain.  o2sat upon arrival 87% on RA, pt placed on 2L via [place].  Pt NAD at this time, respirations equal and unlabored, skin warm and dry.

## 2016-01-12 NOTE — Consult Note (Signed)
GI Inpatient Consult Note  Reason for Consult: Vomiting and abdominal pain   Attending Requesting Consult: Dr. Reece Levy  History of Present Illness: Elizabeth Silva is a 82 y.o. female with a history of HTN, HLD, LBBB, KD stage III, COPD, and h/o C diff infection admitted with vomiting.  Patient is drowsy and difficult to arouse during visit and no family members are present, so the history is largely obtained from provider notes.  Patient presented to the [facility] ED from assisted living via EMS for acute onset of nausea, vomiting, and 2-3 episodes of melena beginning around 10pm last night.  Patient also reported epigastric pain while in the ED.  She was evaluated by her PCP on 3/28 for a medicare wellness visit, and denied acute concerns at that time.  ED evaluation demonstrated:  VSS. Brown, heme + stool  Labs: WBCs 19.6 (10.7 on 3/28), Hgb 12.2 (11.1), BUN 45 (38), Cr 1.81 (1.67) CXR: bilateral basilar nodules Ct chest: enlarging ground-glass nodular opacities in LUL and LLL; solid spiculated nodule in LLL, enlarged significantly from prior CT; nodular airspace opacities in RLL c/w small airway disease; cardiomegaly; heavily calcified aorta  Review of H&P indicates patient has not had further vomiting or melena since admission.  She is not on NSAIDs or anticoagulation.  No prior h/o PUD.  She is presently resting comfortably in NAD, denies pain or nausea while awake.  Of note, serial Hgb returned at 9.8.  WBCs now improved at 10.3.  She was started on IV protonix '40mg'$  q 12hrs BID.  Past Medical History:  Past Medical History  Diagnosis Date  . Allergic rhinitis   . COPD (chronic obstructive pulmonary disease) ([place])   . Diverticulosis of colon   . GERD (gastroesophageal reflux disease)   . Hyperlipidemia   . Hypertension     LE swelling with amlodipine, intolerant of ACEIs  . Osteoarthritis   . CKD (chronic kidney disease)   . Impaired vision     right eye, was told due to HTN  . LBBB  (left bundle branch block)   . Diastolic CHF, acute (HCC)     echo (4/11) with EF 60-65%,mild LVH, mild aortic insufficiency, mild MR, PA systolic pressure 28-41 mmHg, mild to moderate TR  . History of PFTs   . H/O Clostridium difficile infection Sept. 2013  . GI bleed     Problem List: Patient Active Problem List   Diagnosis Date Noted  . Vomiting 01/12/2016  . Melena 01/12/2016  . Abnormal chest x-ray 01/12/2016  . Right knee pain 01/10/2016  . Advance directive discussed with patient 01/10/2016  . Dysphagia 10/27/2015  . Chest tightness 10/04/2015  . Vision loss, bilateral 10/26/2014  . Carotid stenosis 10/26/2014  . Bilateral tinnitus 04/20/2014  . Prediabetes 01/07/2014  . Bilateral knee pain 01/07/2014  . Hip pain, left 01/07/2014  . COPD exacerbation ([place]) 10/14/2013  . Fatigue 08/28/2013  . Insomnia 01/02/2013  . Anemia of chronic renal failure 12/25/2010  . CONSTIPATION 12/08/2010  . DIASTOLIC HEART FAILURE, CHRONIC 03/02/2010  . CAROTID BRUIT, RIGHT 03/02/2010  . INCONTINENCE, URGE 01/25/2010  . PERIPHERAL NEUROPATHY, IDIOPATHIC 05/10/2008  . DYSURIA, CHRONIC 02/27/2008  . PERIPHERAL VASCULAR DISEASE 05/05/2007  . Chronic venous insufficiency 05/05/2007  . ALLERGIC RHINITIS 05/05/2007  . COPD, mild ([place]) 05/05/2007  . GERD 05/05/2007  . DIVERTICULOSIS, COLON 05/05/2007  . Osteoarthritis, multiple sites 05/05/2007  . DEGENERATION, CERVICAL DISC 05/05/2007  . STRESS INCONTINENCE 05/05/2007  . HYPERCHOLESTEROLEMIA, PURE 04/07/2007  . RENAL  STONE 04/07/2007  . Essential hypertension, benign 03/17/2007  . Chronic kidney disease, stage 4, severely decreased GFR ([place]) 03/17/2007  . CARDIAC MURMUR 03/17/2007    Past Surgical History: Past Surgical History  Procedure Laterality Date  . Cardiac catheterization  1999    normal  . Total abdominal hysterectomy  1970    suspicious cells  . Cholecystectomy  1980  . Cervical disc surgery  2005    cerv. disc, dz.  C5-C7, facet arthritis    Allergies: Allergies  Allergen Reactions  . Levofloxacin     REACTION: Burning in legs  . Penicillins     REACTION: Rash    Home Medications: Prescriptions prior to admission  Medication Sig Dispense Refill Last Dose  . acetaminophen (TYLENOL) 500 MG tablet Take 500 mg by mouth every 6 (six) hours as needed.     Taking  . acidophilus (RISAQUAD) CAPS capsule Take 1 capsule by mouth daily.   Taking  . ADVAIR DISKUS 250-50 MCG/DOSE AEPB Inhale 1 puff into the  lungs two times daily 180 each 1 Taking  . albuterol (PROAIR HFA) 108 (90 BASE) MCG/ACT inhaler Inhale 2 puffs into the lungs every 6 (six) hours as needed for wheezing or shortness of breath. 3 Inhaler 1 Taking  . amLODipine (NORVASC) 5 MG tablet Take 1 tablet by mouth two  times daily 180 tablet 1 Taking  . aspirin 81 MG EC tablet Take 81 mg by mouth daily.     Taking  . BYSTOLIC 10 MG tablet Take 1 tablet by mouth  daily 90 tablet 1 Taking  . ferrous fumarate (HEMOCYTE - 106 MG FE) 325 (106 FE) MG TABS tablet Take 1 tablet (106 mg of iron total) by mouth daily. 90 each 3 Taking  . fluticasone (FLONASE) 50 MCG/ACT nasal spray Place 2 sprays into both nostrils daily. (Patient taking differently: Place 2 sprays into both nostrils as needed. ) 48 g 3 Taking  . furosemide (LASIX) 20 MG tablet Take 1 tablet (20 mg total) by mouth daily as needed. 90 tablet 3 Taking  . gabapentin (NEURONTIN) 600 MG tablet Take 1 tablet by mouth at  bedtime 90 tablet 3 Taking  . hydrALAZINE (APRESOLINE) 100 MG tablet Take 1 tablet by mouth 3  times a day 270 tablet 1 Taking  . lovastatin (MEVACOR) 20 MG tablet Take 1 tablet (20 mg total) by mouth at bedtime. 90 tablet 3 Taking  . omeprazole (PRILOSEC) 40 MG capsule Take 1 capsule by mouth  daily (Patient taking differently: Take 40 mg by mouth as needed. Take 1 capsule by mouth  daily) 90 capsule 1 Taking  . oxybutynin (DITROPAN) 5 MG tablet Take 1 tablet by mouth two  times daily  180 tablet 1 Taking  . SPIRIVA HANDIHALER 18 MCG inhalation capsule Inhale the contents of 1  capsule via HandiHaler  daily 90 capsule 1 Taking  . traMADol (ULTRAM) 50 MG tablet Take 1 tablet (50 mg total) by mouth 2 (two) times daily as needed for moderate pain. 180 tablet 0 Taking  . vitamin E 1000 UNIT capsule Take 1,000 Units by mouth daily.     Taking   Home medication reconciliation was completed with the patient.   Scheduled Inpatient Medications:   . amLODipine  5 mg Oral Daily  . Ferrous Fumarate  1 tablet Oral Daily  . fluticasone  2 spray Each Nare Daily  . gabapentin  600 mg Oral QHS  . hydrALAZINE  100 mg Oral 3 times  per day  . mometasone-formoterol  2 puff Inhalation BID  . nebivolol  10 mg Oral Daily  . oxybutynin  5 mg Oral BID  . pantoprazole (PROTONIX) IV  40 mg Intravenous Q12H  . pravastatin  20 mg Oral q1800  . sodium chloride flush  3 mL Intravenous Q12H  . tiotropium  18 mcg Inhalation Daily    Continuous Inpatient Infusions:   . sodium chloride 75 mL/hr at 01/12/16 0950    PRN Inpatient Medications:  acetaminophen **OR** acetaminophen, albuterol, ondansetron **OR** ondansetron (ZOFRAN) IV  Family History: family history includes Cancer in her brother; Colon cancer in her sister; Dementia in her father and sister; Diabetes in her brother; Heart block in her mother; Lung cancer in her brother.    Social History:   reports that she quit smoking about 31 years ago. Her smoking use included Cigarettes. She smoked 1.00 pack per day. She has never used smokeless tobacco. She reports that she drinks alcohol. She reports that she does not use illicit drugs.   Review of Systems: Constitutional: Weight is stable.  Eyes: No changes in vision. ENT: No oral lesions, sore throat.  GI: see HPI.  Heme/Lymph: No easy bruising.  CV: No chest pain.  GU: No hematuria.  Integumentary: No rashes.  Neuro: No headaches.  Psych: No depression/anxiety.  Endocrine: No  heat/cold intolerance.  Allergic/Immunologic: No urticaria.  Resp: No cough, SOB.  Musculoskeletal: No joint swelling.    Physical Examination: BP 113/30 mmHg  Pulse 66  Temp(Src) 99.4 F (37.4 C) (Oral)  Resp 18  Ht '5\' 1"'$  (1.549 m)  Wt 54.432 kg (120 lb)  BMI 22.69 kg/m2  SpO2 100% Gen: NAD, drowsy, difficulty staying awake HEENT: PEERLA, EOMI Neck: supple, no JVD or thyromegaly Chest: CTA bilaterally, no wheezes, crackles, or other adventitious sounds CV: RRR, no m/g/c/r Abd: soft, NT, ND, +BS in all four quadrants; no HSM, guarding, ridigity, or rebound tenderness Ext: no edema, well perfused with 2+ pulses Skin: no rash or lesions noted Lymph: no LAD  Data: Lab Results  Component Value Date   WBC 10.3 01/12/2016   HGB 9.8* 01/12/2016   HCT 30.1* 01/12/2016   MCV 87.5 01/12/2016   PLT 200 01/12/2016    Recent Labs Lab 01/10/16 1231 01/12/16 0022 01/12/16 0852  HGB 11.1* 12.2 9.8*   Lab Results  Component Value Date   NA 139 01/12/2016   K 4.3 01/12/2016   CL 110 01/12/2016   CO2 25 01/12/2016   BUN 41* 01/12/2016   CREATININE 1.59* 01/12/2016   Lab Results  Component Value Date   ALT 11* 01/12/2016   AST 20 01/12/2016   ALKPHOS 59 01/12/2016   BILITOT 0.6 01/12/2016    Recent Labs Lab 01/12/16 0022  INR 1.10   Assessment/Plan: Ms. Garretson is a 82 y.o. female with a history of HTN, HLD, LBBB, KD stage III, COPD, and h/o C diff infection admitted with vomiting.  In the ED, patient reported acute onset of epigastric pain, nausea, vomiting, and 2-3 melena last night.  No further vomiting or melena since admission.  VSS.  Hgb trending down from 12.2 to 9.8.  Patient is drowsy and difficult to arouse.  Melena and anemia likely due to bleeding/oozing from gastric ulcer.  Recommend continuing IV PPI BID and close Hgb monitoring; patient is a poor candidate for EGD given her age and comorbidities.  Will continue to follow.  Further recs per Dr. Rayann Heman.    Recommendations: - Monitor  Hgb, transfuse if <7 - Continue IV Protonix '40mg'$  q 12hrs - Patient is a poor candidate for EGD d/t age and comorbidities  Thank you for the consult. We will follow along with you. Please call with questions or concerns.  Lavera Guise, PA-C [facility] Phone: [phone] Pager: [phone]

## 2016-01-12 NOTE — Progress Notes (Addendum)
No nausea or vomiting or bloody stools today

## 2016-01-12 NOTE — Evaluation (Signed)
Physical Therapy Evaluation Patient Details Name: Elizabeth Silva MRN: [id number] DOB: 02-10-1918 Today's Date: 01/12/2016   History of Present Illness  Pt is a 82 y.o. female with PMH of HTN, COPD, CKD, and osteoarthritis.  Pt presented with nausea, vomitting and upper abdominal pain.  Pt admitted for upper GI bleed.      Clinical Impression  Prior to admission pt reported she was independent with RW; however, pt reported she has had one fall in the last six months.  Pt lives in an assisted living facility. Pt was min assist for bed mobility, sit to stand (2 sets), stand pivot transfer and ambulation of 3 feet (all transfers and ambulation with RW).  Session was modified due to pt fatigue. Prior to admittance, pt and pt's daughter reported that she ambulated to the dining hall and back with RW three times a day.  Pt appeared fatigued during three feet ambulating.  Due to aforementioned function and strength deficits, pt is in need of skilled physical therapy.  It is recommended that pt is discharged to SNF when medically appropriate.     Follow Up Recommendations SNF    Equipment Recommendations  None recommended by PT    Recommendations for Other Services       Precautions / Restrictions Precautions Precautions: Fall Restrictions Weight Bearing Restrictions: No      Mobility  Bed Mobility Overal bed mobility: Needs Assistance Bed Mobility: Rolling;Sidelying to Sit Rolling: Min guard Sidelying to sit: Min assist       General bed mobility comments: increased time, assist with trunk and LE   Transfers Overall transfer level: Needs assistance Equipment used: Rolling walker (2 wheeled) Transfers: Sit to/from [facility] Sit to Stand: Min assist (2 sets) Stand pivot transfers: Min assist       General transfer comment: increased time, VC's and tactile cues for hand and walker placement   Ambulation/Gait Ambulation/Gait assistance: Min assist Ambulation  Distance (Feet): 3 Feet Assistive device: Rolling walker (2 wheeled) Gait Pattern/deviations: Decreased step length - right;Decreased step length - left;Step-to pattern Gait velocity: decreased       Stairs            Wheelchair Mobility    Modified Rankin (Stroke Patients Only)       Balance Overall balance assessment: Needs assistance Sitting-balance support: Feet supported Sitting balance-Leahy Scale: Fair     Standing balance support: Bilateral upper extremity supported (RW ) Standing balance-Leahy Scale: Fair                               Pertinent Vitals/Pain Pain Assessment: 0-10 Pain Score: 6  Pain Location: L LE Pain Descriptors / Indicators: Aching;Discomfort Pain Intervention(s): Limited activity within patient's tolerance;Monitored during session;Repositioned  See flow sheet for vitals.     Home Living Family/patient expects to be discharged to:: Assisted living               Home Equipment: Walker - 2 wheels      Prior Function Level of Independence: Independent with assistive device(s)         Comments: Pt reported she used RW to walk to the dining hall three times a day but did not use RW for room mobility      Hand Dominance        Extremity/Trunk Assessment   Upper Extremity Assessment: Generalized weakness    shoulder flexors, shoulder abductors, elbow flexors, elbow  extensors: B at least a 3/5  Grip:  B decreased strength        Lower Extremity Assessment: Generalized weakness   Dorsiflexors:  B at least a 3/5  Quadriceps, Hamstrings, Hip flexors, hip abductors: R: at least a 3/5 L: 2+/5      Cervical / Trunk Assessment: Normal  Communication   Communication: No difficulties  Cognition Arousal/Alertness: Awake/alert Behavior During Therapy: WFL for tasks assessed/performed Overall Cognitive Status: Within Functional Limits for tasks assessed                      General Comments    Nursing was contacted and cleared pt for physical therapy.  Pt was agreeable and session was modified due to fatigue.  [name] was present during the entire session.      Exercises        Assessment/Plan    PT Assessment Patient needs continued PT services  PT Diagnosis Generalized weakness   PT Problem List Decreased strength;Decreased range of motion;Decreased activity tolerance;Pain  PT Treatment Interventions DME instruction;Gait training;Functional mobility training;Therapeutic activities;Therapeutic exercise;Patient/family education   PT Goals (Current goals can be found in the Care Plan section) Acute Rehab PT Goals Patient Stated Goal: to go back to her facility PT Goal Formulation: With patient Time For Goal Achievement: 01/26/16 Potential to Achieve Goals: Fair    Frequency Min 2X/week   Barriers to discharge        Co-evaluation               End of Session Equipment Utilized During Treatment: Gait belt;Oxygen (2 L/min O2 via nasal cannula ) Activity Tolerance: Patient limited by fatigue Patient left: in chair;with call bell/phone within reach;with chair alarm set;with family/visitor present Nurse Communication: Mobility status         Time: 5643-3295 PT Time Calculation (min) (ACUTE ONLY): 25 min   Charges:         PT G Codes:       Mittie Bodo, SPT Mittie Bodo 01/12/2016, 3:11 PM

## 2016-01-12 NOTE — ED Notes (Signed)
Pt o2sat 88% on 2L [place].  Pt increased to 3L, repositioned to sit more upright.  Pt o2sat 95%

## 2016-01-12 NOTE — Care Management Obs Status (Signed)
[place] NOTIFICATION   Patient Details  Name: Elizabeth Silva MRN: [id number] Date of Birth: [birth year]   Medicare Observation Status Notification Given:  Yes    Marshell Garfinkel, RN 01/12/2016, 8:10 AM

## 2016-01-12 NOTE — ED Notes (Signed)
Attempted to call report, floor states unable to take report at this time.  Next shift nurse to call report

## 2016-01-12 NOTE — ED Notes (Signed)
Pt transported to inpatient unit

## 2016-01-12 NOTE — ED Notes (Signed)
Pt resting in bed, eyes closed, resp even, will continue to monitor

## 2016-01-12 NOTE — Care Management Note (Signed)
Case Management Note  Patient Details  Name: Elizabeth Silva MRN: [id number] Date of Birth: 01-10-1918  Subjective/Objective:                  Met with patient to discuss discharge planning. Patient is [facility] Medicare OBS and will not require 3-night inpatient stay for SNF if recommended by PT. Patient is from [facility] where she states she ambulated with a walker. She is interested in rehab at [facility]. O2 is new for this patient. She is alert and oriented times 4. Her PCP is Dr. Diona Browner.  Action/Plan:  List of home health care agencies left at patient's bedside. RNCM will continue to follow. CSW updated.   Expected Discharge Date:                  Expected Discharge Plan:     In-House Referral:     Discharge planning Services  CM Consult  Post Acute Care Choice:  Home Health Choice offered to:  Patient  DME Arranged:    DME Agency:     HH Arranged:    [place] Agency:     Status of Service:  In process, will continue to follow  Medicare Important Message Given:    Date Medicare IM Given:    Medicare IM give by:    Date Additional Medicare IM Given:    Additional Medicare Important Message give by:     If discussed at [place] of Stay Meetings, dates discussed:    Additional Comments:  Marshell Garfinkel, RN 01/12/2016, 11:16 AM

## 2016-01-12 NOTE — Progress Notes (Signed)
Pt with 9 beat run of flutter. Vss. Dr  Bridgett Larsson notified. Pt asymptomatic

## 2016-01-12 NOTE — Progress Notes (Signed)
DR REIN ON ROUNDS FOR EVALUATION. MD REPORTS START PT ON CLEAR LIQUID DIET. NO REASON FOR SCOPE AT THIS TIME

## 2016-01-12 NOTE — Progress Notes (Signed)
OT Cancellation Note  Patient Details Name: Elizabeth Silva MRN: [id number] DOB: 11-03-1917   Cancelled Treatment:    Reason Eval/Treat Not Completed: Other (comment) (pt. declined secondary to abdominal pain and vomiting during the night.. Diastolic BP low this a.m.Marland Kitchen) Harrel Carina, MS, OTR/L  Harrel Carina 01/12/2016, 12:03 PM

## 2016-01-12 NOTE — ED Notes (Signed)
Hospitalist at bedside 

## 2016-01-12 NOTE — Clinical Social Work Placement (Signed)
   CLINICAL SOCIAL WORK PLACEMENT  NOTE  Date:  01/12/2016  Patient Details  Name: Elizabeth Silva MRN: [id number] Date of Birth: 02-03-1918  Clinical Social Work is seeking post-discharge placement for this patient at the [place] level of care (*CSW will initial, date and re-position this form in  chart as items are completed):  Yes   Patient/family provided with [place] Work Department's list of facilities offering this level of care within the geographic area requested by the patient (or if unable, by the patient's family).  Yes   Patient/family informed of their freedom to choose among providers that offer the needed level of care, that participate in Medicare, Medicaid or managed care program needed by the patient, have an available bed and are willing to accept the patient.  Yes   Patient/family informed of [place]'s ownership interest in [facility] and [facility], as well as of the fact that they are under no obligation to receive care at these facilities.  PASRR submitted to EDS on 01/12/16     PASRR number received on 01/12/16     Existing PASRR number confirmed on       FL2 transmitted to all facilities in geographic area requested by pt/family on 01/12/16     FL2 transmitted to all facilities within larger geographic area on       Patient informed that his/her managed care company has contracts with or will negotiate with certain facilities, including the following:            Patient/family informed of bed offers received.  Patient chooses bed at       Physician recommends and patient chooses bed at      Patient to be transferred to   on  .  Patient to be transferred to facility by       Patient family notified on   of transfer.  Name of family member notified:        PHYSICIAN       Additional Comment:    _______________________________________________ Loralyn Freshwater, LCSW 01/12/2016, 11:47 AM

## 2016-01-12 NOTE — Progress Notes (Signed)
PT is recommending SNF. Clinical Social Worker (CSW) met with patient and her daughter Collie Siad at bedside. CSW presented bed offers. Daughter chose [facility]. Daughter inquired about patient's observation status. CSW explained the difference between inpatient and observation. Patient has a managed Medicare plan through [facility] which does not require a 3 night observation stay to pay for SNF. Plan is for patient to D/C to [facility] when stable. CSW will continue to follow and assist as needed.   Blima Rich, LCSW [phone]

## 2016-01-12 NOTE — Progress Notes (Signed)
Diastolic bp low. Pt on high doses of hydralazine . Dr Bridgett Larsson notified. md will make chg in bp meds

## 2016-01-13 DIAGNOSIS — R0902 Hypoxemia: Secondary | ICD-10-CM | POA: Diagnosis not present

## 2016-01-13 DIAGNOSIS — I447 Left bundle-branch block, unspecified: Secondary | ICD-10-CM | POA: Diagnosis not present

## 2016-01-13 DIAGNOSIS — R1013 Epigastric pain: Secondary | ICD-10-CM | POA: Diagnosis not present

## 2016-01-13 DIAGNOSIS — D649 Anemia, unspecified: Secondary | ICD-10-CM | POA: Diagnosis not present

## 2016-01-13 DIAGNOSIS — J309 Allergic rhinitis, unspecified: Secondary | ICD-10-CM | POA: Diagnosis not present

## 2016-01-13 DIAGNOSIS — M1990 Unspecified osteoarthritis, unspecified site: Secondary | ICD-10-CM | POA: Diagnosis not present

## 2016-01-13 DIAGNOSIS — I872 Venous insufficiency (chronic) (peripheral): Secondary | ICD-10-CM | POA: Diagnosis not present

## 2016-01-13 DIAGNOSIS — E639 Nutritional deficiency, unspecified: Secondary | ICD-10-CM | POA: Diagnosis not present

## 2016-01-13 DIAGNOSIS — N189 Chronic kidney disease, unspecified: Secondary | ICD-10-CM | POA: Diagnosis not present

## 2016-01-13 DIAGNOSIS — I509 Heart failure, unspecified: Secondary | ICD-10-CM | POA: Diagnosis not present

## 2016-01-13 DIAGNOSIS — I5032 Chronic diastolic (congestive) heart failure: Secondary | ICD-10-CM | POA: Diagnosis not present

## 2016-01-13 DIAGNOSIS — Z7951 Long term (current) use of inhaled steroids: Secondary | ICD-10-CM | POA: Diagnosis not present

## 2016-01-13 DIAGNOSIS — Z7982 Long term (current) use of aspirin: Secondary | ICD-10-CM | POA: Diagnosis not present

## 2016-01-13 DIAGNOSIS — M25569 Pain in unspecified knee: Secondary | ICD-10-CM | POA: Diagnosis not present

## 2016-01-13 DIAGNOSIS — R262 Difficulty in walking, not elsewhere classified: Secondary | ICD-10-CM | POA: Diagnosis not present

## 2016-01-13 DIAGNOSIS — J449 Chronic obstructive pulmonary disease, unspecified: Secondary | ICD-10-CM | POA: Diagnosis not present

## 2016-01-13 DIAGNOSIS — K219 Gastro-esophageal reflux disease without esophagitis: Secondary | ICD-10-CM | POA: Diagnosis not present

## 2016-01-13 DIAGNOSIS — K921 Melena: Secondary | ICD-10-CM | POA: Diagnosis not present

## 2016-01-13 DIAGNOSIS — I700 Atherosclerosis of aorta: Secondary | ICD-10-CM | POA: Diagnosis not present

## 2016-01-13 DIAGNOSIS — D631 Anemia in chronic kidney disease: Secondary | ICD-10-CM | POA: Diagnosis not present

## 2016-01-13 DIAGNOSIS — N184 Chronic kidney disease, stage 4 (severe): Secondary | ICD-10-CM | POA: Diagnosis not present

## 2016-01-13 DIAGNOSIS — I739 Peripheral vascular disease, unspecified: Secondary | ICD-10-CM | POA: Diagnosis not present

## 2016-01-13 DIAGNOSIS — D72829 Elevated white blood cell count, unspecified: Secondary | ICD-10-CM | POA: Diagnosis not present

## 2016-01-13 DIAGNOSIS — I130 Hypertensive heart and chronic kidney disease with heart failure and stage 1 through stage 4 chronic kidney disease, or unspecified chronic kidney disease: Secondary | ICD-10-CM | POA: Diagnosis not present

## 2016-01-13 DIAGNOSIS — E785 Hyperlipidemia, unspecified: Secondary | ICD-10-CM | POA: Diagnosis not present

## 2016-01-13 DIAGNOSIS — R011 Cardiac murmur, unspecified: Secondary | ICD-10-CM | POA: Diagnosis not present

## 2016-01-13 DIAGNOSIS — Z7409 Other reduced mobility: Secondary | ICD-10-CM | POA: Diagnosis not present

## 2016-01-13 DIAGNOSIS — M6281 Muscle weakness (generalized): Secondary | ICD-10-CM | POA: Diagnosis not present

## 2016-01-13 DIAGNOSIS — E7800 Pure hypercholesterolemia, unspecified: Secondary | ICD-10-CM | POA: Diagnosis not present

## 2016-01-13 DIAGNOSIS — R112 Nausea with vomiting, unspecified: Secondary | ICD-10-CM | POA: Diagnosis not present

## 2016-01-13 DIAGNOSIS — Z79899 Other long term (current) drug therapy: Secondary | ICD-10-CM | POA: Diagnosis not present

## 2016-01-13 DIAGNOSIS — K922 Gastrointestinal hemorrhage, unspecified: Secondary | ICD-10-CM | POA: Diagnosis not present

## 2016-01-13 DIAGNOSIS — R404 Transient alteration of awareness: Secondary | ICD-10-CM | POA: Diagnosis not present

## 2016-01-13 DIAGNOSIS — D62 Acute posthemorrhagic anemia: Secondary | ICD-10-CM | POA: Diagnosis not present

## 2016-01-13 DIAGNOSIS — R918 Other nonspecific abnormal finding of lung field: Secondary | ICD-10-CM | POA: Diagnosis not present

## 2016-01-13 DIAGNOSIS — R350 Frequency of micturition: Secondary | ICD-10-CM | POA: Diagnosis not present

## 2016-01-13 MED ORDER — PANTOPRAZOLE SODIUM 40 MG PO TBEC: 40.0000 mg | DELAYED_RELEASE_TABLET | Freq: Every day | ORAL | Status: DC

## 2016-01-13 NOTE — Progress Notes (Addendum)
Doctor Bridgett Larsson put order to discharge patient if doctor Rein GI agree. Doctor Rayann Heman paged and he called back at 1350 to give verbal agreement about patient discharge. [place] called at [employer], rapport given to [place], Therapist, sports.

## 2016-01-13 NOTE — Discharge Summary (Signed)
[place] [facility] [place] NAME: Elizabeth Silva    MR#:  [id number]  DATE OF BIRTH:  03/10/1918  DATE OF ADMISSION:  01/12/2016 ADMITTING PHYSICIAN: Demetrios Loll, MD  DATE OF DISCHARGE: 01/13/2016 PRIMARY CARE PHYSICIAN: Eliezer Lofts, MD    ADMISSION DIAGNOSIS:  Melena [K92.1] Upper GI bleed [K92.2] Hypoxia [R09.02]   DISCHARGE DIAGNOSIS:  GIB with Melena anemia with acute blood loss. Nausea vomiting with associated upper abdominal pain  SECONDARY DIAGNOSIS:   Past Medical History  Diagnosis Date  . Allergic rhinitis   . COPD (chronic obstructive pulmonary disease) ([place])   . Diverticulosis of colon   . GERD (gastroesophageal reflux disease)   . Hyperlipidemia   . Hypertension     LE swelling with amlodipine, intolerant of ACEIs  . Osteoarthritis   . CKD (chronic kidney disease)   . Impaired vision     right eye, was told due to HTN  . LBBB (left bundle branch block)   . Diastolic CHF, acute (HCC)     echo (4/11) with EF 60-65%,mild LVH, mild aortic insufficiency, mild MR, PA systolic pressure 32-35 mmHg, mild to moderate TR  . History of PFTs   . H/O Clostridium difficile infection Sept. 2013  . GI bleed     HOSPITAL COURSE:   1. Nausea vomiting with associated upper abdominal pain. Improved. On zofran prn. IVF.  2. GIB with Melena, anemia with acute blood loss. Hb decreased to 9.6 from 12.1. But stable and no active bleeding since admission.  Per GI, - Monitor Hgb, transfuse if <7 Treated with IV Protonix '40mg'$  q 12hrs, change to po bid. - Patient is a poor candidate for EGD d/t age and comorbidities  3. Abnormal chest x-ray with bilateral nodules. History of bilateral pulmonary nodules per CT done in January 2017, suspicious for malignancy but patient's family refused for further workup.   4. Leukocytosis, likely reactive. Improved.  5. Hypertension, stable on home medications. But diastolic BP is low, discontinue  hydralazine.  6. COPD, stable on home medications.  7. CKD stage 4, stable clinically.   DISCHARGE CONDITIONS:   Stable, discharge to SNF today.   CONSULTS OBTAINED:  Treatment Team:  Josefine Class, MD  DRUG ALLERGIES:   Allergies  Allergen Reactions  . Levofloxacin     REACTION: Burning in legs  . Penicillins     REACTION: Rash    DISCHARGE MEDICATIONS:   Current Discharge Medication List    START taking these medications   Details  pantoprazole (PROTONIX) 40 MG tablet Take 1 tablet (40 mg total) by mouth daily. Qty: 60 tablet, Refills: 0      CONTINUE these medications which have NOT CHANGED   Details  acetaminophen (TYLENOL) 500 MG tablet Take 500 mg by mouth every 6 (six) hours as needed.      acidophilus (RISAQUAD) CAPS capsule Take 1 capsule by mouth daily.    ADVAIR DISKUS 250-50 MCG/DOSE AEPB Inhale 1 puff into the  lungs two times daily Qty: 180 each, Refills: 1    albuterol (PROAIR HFA) 108 (90 BASE) MCG/ACT inhaler Inhale 2 puffs into the lungs every 6 (six) hours as needed for wheezing or shortness of breath. Qty: 3 Inhaler, Refills: 1    amLODipine (NORVASC) 5 MG tablet Take 1 tablet by mouth two  times daily Qty: 180 tablet, Refills: 1    aspirin 81 MG EC tablet Take 81 mg by mouth daily.  BYSTOLIC 10 MG tablet Take 1 tablet by mouth  daily Qty: 90 tablet, Refills: 1    ferrous fumarate (HEMOCYTE - 106 MG FE) 325 (106 FE) MG TABS tablet Take 1 tablet (106 mg of iron total) by mouth daily. Qty: 90 each, Refills: 3    fluticasone (FLONASE) 50 MCG/ACT nasal spray Place 2 sprays into both nostrils daily. Qty: 48 g, Refills: 3    furosemide (LASIX) 20 MG tablet Take 1 tablet (20 mg total) by mouth daily as needed. Qty: 90 tablet, Refills: 3    gabapentin (NEURONTIN) 600 MG tablet Take 1 tablet by mouth at  bedtime Qty: 90 tablet, Refills: 3    lovastatin (MEVACOR) 20 MG tablet Take 1 tablet (20 mg total) by mouth at bedtime. Qty:  90 tablet, Refills: 3    oxybutynin (DITROPAN) 5 MG tablet Take 1 tablet by mouth two  times daily Qty: 180 tablet, Refills: 1    SPIRIVA HANDIHALER 18 MCG inhalation capsule Inhale the contents of 1  capsule via HandiHaler  daily Qty: 90 capsule, Refills: 1    vitamin E 1000 UNIT capsule Take 1,000 Units by mouth daily.        STOP taking these medications     hydrALAZINE (APRESOLINE) 100 MG tablet      omeprazole (PRILOSEC) 40 MG capsule      traMADol (ULTRAM) 50 MG tablet          DISCHARGE INSTRUCTIONS:    If you experience worsening of your admission symptoms, develop shortness of breath, life threatening emergency, suicidal or homicidal thoughts you must seek medical attention immediately by calling 911 or calling your MD immediately  if symptoms less severe.  You Must read complete instructions/literature along with all the possible adverse reactions/side effects for all the Medicines you take and that have been prescribed to you. Take any new Medicines after you have completely understood and accept all the possible adverse reactions/side effects.   Please note  You were cared for by a hospitalist during your hospital stay. If you have any questions about your discharge medications or the care you received while you were in the hospital after you are discharged, you can call the unit and asked to speak with the hospitalist on call if the hospitalist that took care of you is not available. Once you are discharged, your primary care physician will handle any further medical issues. Please note that NO REFILLS for any discharge medications will be authorized once you are discharged, as it is imperative that you return to your primary care physician (or establish a relationship with a primary care physician if you do not have one) for your aftercare needs so that they can reassess your need for medications and monitor your lab values.    Today   SUBJECTIVE   No  complaint.   VITAL SIGNS:  Blood pressure 133/36, pulse 66, temperature 97.9 F (36.6 C), temperature source Oral, resp. rate 16, height '5\' 1"'$  (1.549 m), weight 58.06 kg (128 lb), SpO2 97 %.  I/O:   Intake/Output Summary (Last 24 hours) at 01/13/16 1233 Last data filed at 01/13/16 4627  Gross per 24 hour  Intake    360 ml  Output      5 ml  Net    355 ml    PHYSICAL EXAMINATION:  GENERAL:  82 y.o.-year-old patient lying in the bed with no acute distress.  EYES: Pupils equal, round, reactive to light and accommodation. No scleral icterus. Extraocular  muscles intact.  HEENT: Head atraumatic, normocephalic. Oropharynx and nasopharynx clear.  NECK:  Supple, no jugular venous distention. No thyroid enlargement, no tenderness.  LUNGS: Normal breath sounds bilaterally, no wheezing, rales,rhonchi or crepitation. No use of accessory muscles of respiration.  CARDIOVASCULAR: S1, S2 normal. No murmurs, rubs, or gallops.  ABDOMEN: Soft, non-tender, non-distended. Bowel sounds present. No organomegaly or mass.  EXTREMITIES: No pedal edema, cyanosis, or clubbing.  NEUROLOGIC: Cranial nerves II through XII are intact. Muscle strength 5/5 in all extremities. Sensation intact. Gait not checked.  PSYCHIATRIC: The patient is alert and oriented x 3.  SKIN: No obvious rash, lesion, or ulcer.   DATA REVIEW:   CBC  Recent Labs Lab 01/12/16 2038  WBC 9.0  HGB 9.3*  HCT 28.5*  PLT 176    Chemistries   Recent Labs Lab 01/12/16 0022 01/12/16 0852  NA 138 139  K 4.1 4.3  CL 103 110  CO2 25 25  GLUCOSE 156* 104*  BUN 45* 41*  CREATININE 1.81* 1.59*  CALCIUM 9.2 8.3*  AST 20  --   ALT 11*  --   ALKPHOS 59  --   BILITOT 0.6  --     Cardiac Enzymes  Recent Labs Lab 01/12/16 0022  TROPONINI <0.03    Microbiology Results  Results for orders placed or performed during the hospital encounter of 01/12/16  MRSA PCR Screening     Status: None   Collection Time: 01/12/16 11:54 AM   Result Value Ref Range Status   MRSA by PCR NEGATIVE NEGATIVE Final    Comment:        The GeneXpert MRSA Assay (FDA approved for NASAL specimens only), is one component of a comprehensive MRSA colonization surveillance program. It is not intended to diagnose MRSA infection nor to guide or monitor treatment for MRSA infections.     RADIOLOGY:  Dg Chest Port 1 View  01/12/2016  CLINICAL DATA:  82 year old female with nausea vomiting. EXAM: PORTABLE CHEST 1 VIEW COMPARISON:  Chest CT dated 11/07/2015 and radiograph dated 10/14/2015 FINDINGS: There is an 18 mm hazy nodular density in the left lower lung field superimposed on the left fifth anterior rib corresponding to the nodule seen on the CT. A 22 mm hazy nodular density noted at the left lung base. There is a 2.7 x 3.9 cm confluent area of hazy density in the right lung base likely corresponding atelectatic changes noted on the CT in the right middle lobe. There is no pleural effusion or pneumothorax. Mild cardiomegaly. There is atherosclerotic calcification of the aortic arch. No acute osseous pathology. IMPRESSION: Bibasilar hazy nodular densities as described corresponding to the nodules seen on the CT. No pleural effusion, or pneumothorax. Electronically Signed   By: Anner Crete M.D.   On: 01/12/2016 01:35        Management plans discussed with the patient, her daughter and they are in agreement.  CODE STATUS:     Code Status Orders        Start     Ordered   01/12/16 0806  Full code   Continuous     01/12/16 0805    Code Status History    Date Active Date Inactive Code Status Order ID Comments User Context   This patient has a current code status but no historical code status.    Advance Directive Documentation        Most Recent Value   Type of Advance Directive  [place]  Pre-existing out of facility DNR order (yellow form or pink MOST form)     "MOST" Form in Place?        TOTAL  TIME TAKING CARE OF THIS PATIENT: 35 minutes.    Demetrios Loll M.D on 01/13/2016 at 12:33 PM  Between 7am to 6pm - Pager - [phone]  After 6pm go to [email or web address] - password [email or web address] [facility]  [facility]  Office  [phone]  CC: Primary care physician; Eliezer Lofts, MD

## 2016-01-13 NOTE — Progress Notes (Signed)
Patient discharged at 1620 to [facility] via EMS. Patient A&Ox4 no complain of pain or discomfort at discharge.

## 2016-01-13 NOTE — Progress Notes (Signed)
Spoke with Apolonio Schneiders, [facility] rep at [phone], to notify of non-emergent EMS transport.  Auth notification reference given as [id number].   Service date range good for 90 days from 01/13/16.   Gap exception requested to determine if services can be considered at an in-network level.

## 2016-01-13 NOTE — Clinical Social Work Placement (Signed)
   CLINICAL SOCIAL WORK PLACEMENT  NOTE  Date:  01/13/2016  Patient Details  Name: Elizabeth Silva MRN: [id number] Date of Birth: [birth year]  Clinical Social Work is seeking post-discharge placement for this patient at the [place] level of care (*CSW will initial, date and re-position this form in  chart as items are completed):  Yes   Patient/family provided with [place] Work Department's list of facilities offering this level of care within the geographic area requested by the patient (or if unable, by the patient's family).  Yes   Patient/family informed of their freedom to choose among providers that offer the needed level of care, that participate in Medicare, Medicaid or managed care program needed by the patient, have an available bed and are willing to accept the patient.  Yes   Patient/family informed of [place]'s ownership interest in [facility] and [facility], as well as of the fact that they are under no obligation to receive care at these facilities.  PASRR submitted to EDS on 01/12/16     PASRR number received on 01/12/16     Existing PASRR number confirmed on       FL2 transmitted to all facilities in geographic area requested by pt/family on 01/12/16     FL2 transmitted to all facilities within larger geographic area on       Patient informed that his/her managed care company has contracts with or will negotiate with certain facilities, including the following:        Yes   Patient/family informed of bed offers received.  Patient chooses bed at [facility]     Physician recommends and patient chooses bed at  [facility])    [facility]  Patient to be transferred to facility by [facility]     Patient family notified on 01/13/16 of transfer.  Name of family member notified:  [name]     PHYSICIAN       Additional Comment:     _______________________________________________ Darden Dates, LCSW 01/13/2016, 4:11 PM

## 2016-01-13 NOTE — Clinical Social Work Note (Signed)
Pt is ready for discharge today to [facility]. Facility has received discharge information and is ready to admit pt. [name] is agreeable to discharge plan, pt was sleeping soundly. RN called report and EMS will provide transportation. CSW is signing off as no further needs identified.   Darden Dates, MSW, LCSW Clinical Social Worker  [phone]

## 2016-01-14 ENCOUNTER — Encounter
Admission: RE | Admit: 2016-01-14 | Discharge: 2016-01-14 | Disposition: A | Discharge status: Home / Self Care | Payer: Medicare Other | Source: Ambulatory Visit | Attending: Internal Medicine | Admitting: Internal Medicine

## 2016-01-16 DIAGNOSIS — I509 Heart failure, unspecified: Secondary | ICD-10-CM | POA: Diagnosis not present

## 2016-01-16 DIAGNOSIS — D649 Anemia, unspecified: Secondary | ICD-10-CM | POA: Diagnosis not present

## 2016-01-16 DIAGNOSIS — R918 Other nonspecific abnormal finding of lung field: Secondary | ICD-10-CM | POA: Diagnosis not present

## 2016-01-16 DIAGNOSIS — J449 Chronic obstructive pulmonary disease, unspecified: Secondary | ICD-10-CM | POA: Diagnosis not present

## 2016-01-16 DIAGNOSIS — M25569 Pain in unspecified knee: Secondary | ICD-10-CM | POA: Diagnosis not present

## 2016-01-24 ENCOUNTER — Ambulatory Visit: Payer: Medicare Other | Admitting: Family Medicine

## 2016-01-25 ENCOUNTER — Ambulatory Visit: Payer: Medicare Other | Admitting: Family Medicine

## 2016-01-30 ENCOUNTER — Telehealth: Payer: Self-pay | Admitting: Cardiovascular Disease

## 2016-01-30 MED ORDER — HYDRALAZINE HCL 100 MG PO TABS: 100.0000 mg | ORAL_TABLET | Freq: Three times a day (TID) | ORAL | Status: DC

## 2016-01-30 NOTE — Telephone Encounter (Signed)
Pt c/o BP issue: STAT if pt c/o blurred vision, one-sided weakness or slurred speech  1. What are your last 5 BP readings?  01/30/16 122/49 01/29/16 am 155/59 pm 174/54 2. Are you having any other symptoms (ex. Dizziness, headache, blurred vision, passed out)?  Having some blurred Vision 3. What is your BP issue?  Dr Ouida Sills at [facility] took patient off medication  Amlodipine and Hydralazine  Not sure if they need to be placed back on it.  Please advise

## 2016-01-30 NOTE — Telephone Encounter (Signed)
Spoke w/ Collie Siad.  She reports that pt had stomach virus and was hospitalized for dehydration. She was sent to [facility] for recovery and while there, Dr. Ouida Sills d/c'd her amlodipine & hydralazine, as BPs were low and pt was sedentary.  Pt is now back at [facility], has resumed her ADLs and her BP is creeping up.  Advised her to have pt resume amlodipine & hydralazine, as this change was most likely temporary.  Asked her to continue to monitor BP and call back if she is concerned about readings.  She is appreciative and will call back w/ any further questions or concerns.

## 2016-02-13 ENCOUNTER — Telehealth: Payer: Self-pay | Admitting: Cardiovascular Disease

## 2016-02-13 NOTE — Telephone Encounter (Signed)
Pt c/o BP issue: STAT if pt c/o blurred vision, one-sided weakness or slurred speech  1. What are your last 5 BP readings? 02/11/16  2:00 pm laying down 120/36 15 min later standing up 116/40  2. Are you having any other symptoms (ex. Dizziness, headache, blurred vision, passed out)? Patient fell in the middle of the night going to the bathroom  3. What is your BP issue? Daughter feels like she is  On too much bp meds.  Please call daughter Collie Siad at [phone]

## 2016-02-13 NOTE — Telephone Encounter (Signed)
Left message for Collie Siad to call back.

## 2016-02-13 NOTE — Telephone Encounter (Signed)
She could perhaps stop the amlodipine 5 mg at nighttime, continue the 5 mg in the morning Would continue to monitor pressure

## 2016-02-13 NOTE — Telephone Encounter (Signed)
Spoke w/ Collie Siad.  Advised her of Dr. Donivan Scull recommendation.  Asked her to call back if readings and sx do not improve.

## 2016-02-13 NOTE — Telephone Encounter (Signed)
Spoke w/ pt's daughter Collie Siad. She reports that pt got up to use the restroom the other night, did not dangle on the side of the bed long enough and fell after standing up. Pt's other daughter is RN, did orthostatics on pt and she feels that pt is on too many BP meds. Collie Siad does not have BP readings for pt, called pt's room @ [phone] to speak w/ pt.  She reports BP 4/29 120/40 4/30 141/58, 126/34 Pt feels fine overall, but thinks her BP dropped before she fell; also reports that she fell about 3 mos ago and feels that her BP dropped at that time, as well. She would like to make Dr. Rockey Situ aware and see if he is agreeable to reducing pt's meds. Advised her that I will make him aware and call back w/ his recommendation.

## 2016-02-14 ENCOUNTER — Telehealth: Payer: Self-pay | Admitting: Family Medicine

## 2016-02-14 DIAGNOSIS — R296 Repeated falls: Secondary | ICD-10-CM

## 2016-02-14 NOTE — Telephone Encounter (Signed)
[facility] Representative Burke Keels stopped by to ask you to place a Home Health Referral for Physical Therapy for Elizabeth Silva. The inhouse Therapy Dept is out of network for her [facility]. She recently had a fall off the comode and hit her head. The DX can be History of Falls and [facility] would be able to provide her with the in house PT that she needs. Please place the [facility] Referral for this patient. Resident notes are in your inbox for review and scanning.

## 2016-02-20 ENCOUNTER — Telehealth: Payer: Self-pay | Admitting: Family Medicine

## 2016-02-20 DIAGNOSIS — I872 Venous insufficiency (chronic) (peripheral): Secondary | ICD-10-CM | POA: Diagnosis not present

## 2016-02-20 DIAGNOSIS — D631 Anemia in chronic kidney disease: Secondary | ICD-10-CM | POA: Diagnosis not present

## 2016-02-20 DIAGNOSIS — M150 Primary generalized (osteo)arthritis: Secondary | ICD-10-CM | POA: Diagnosis not present

## 2016-02-20 DIAGNOSIS — H543 Unqualified visual loss, both eyes: Secondary | ICD-10-CM | POA: Diagnosis not present

## 2016-02-20 DIAGNOSIS — R7303 Prediabetes: Secondary | ICD-10-CM | POA: Diagnosis not present

## 2016-02-20 DIAGNOSIS — N184 Chronic kidney disease, stage 4 (severe): Secondary | ICD-10-CM | POA: Diagnosis not present

## 2016-02-20 DIAGNOSIS — J449 Chronic obstructive pulmonary disease, unspecified: Secondary | ICD-10-CM | POA: Diagnosis not present

## 2016-02-20 DIAGNOSIS — R262 Difficulty in walking, not elsewhere classified: Secondary | ICD-10-CM | POA: Diagnosis not present

## 2016-02-20 DIAGNOSIS — I739 Peripheral vascular disease, unspecified: Secondary | ICD-10-CM | POA: Diagnosis not present

## 2016-02-20 DIAGNOSIS — I5032 Chronic diastolic (congestive) heart failure: Secondary | ICD-10-CM | POA: Diagnosis not present

## 2016-02-20 DIAGNOSIS — M5030 Other cervical disc degeneration, unspecified cervical region: Secondary | ICD-10-CM | POA: Diagnosis not present

## 2016-02-20 DIAGNOSIS — I130 Hypertensive heart and chronic kidney disease with heart failure and stage 1 through stage 4 chronic kidney disease, or unspecified chronic kidney disease: Secondary | ICD-10-CM | POA: Diagnosis not present

## 2016-02-20 NOTE — Telephone Encounter (Signed)
Verbal order given to Claiborne Billings with [facility] for PT 3 x a week for 3 weeks, then 2 x a week for 2 weeks.

## 2016-02-20 NOTE — Telephone Encounter (Signed)
Elizabeth Silva needs a verbal order for PT 3x a week for 3 weeks 2 x a week for 2 weeks

## 2016-02-22 DIAGNOSIS — I872 Venous insufficiency (chronic) (peripheral): Secondary | ICD-10-CM | POA: Diagnosis not present

## 2016-02-22 DIAGNOSIS — N184 Chronic kidney disease, stage 4 (severe): Secondary | ICD-10-CM | POA: Diagnosis not present

## 2016-02-22 DIAGNOSIS — R7303 Prediabetes: Secondary | ICD-10-CM | POA: Diagnosis not present

## 2016-02-22 DIAGNOSIS — I130 Hypertensive heart and chronic kidney disease with heart failure and stage 1 through stage 4 chronic kidney disease, or unspecified chronic kidney disease: Secondary | ICD-10-CM | POA: Diagnosis not present

## 2016-02-22 DIAGNOSIS — M150 Primary generalized (osteo)arthritis: Secondary | ICD-10-CM | POA: Diagnosis not present

## 2016-02-22 DIAGNOSIS — H543 Unqualified visual loss, both eyes: Secondary | ICD-10-CM | POA: Diagnosis not present

## 2016-02-22 DIAGNOSIS — I5032 Chronic diastolic (congestive) heart failure: Secondary | ICD-10-CM | POA: Diagnosis not present

## 2016-02-22 DIAGNOSIS — I739 Peripheral vascular disease, unspecified: Secondary | ICD-10-CM | POA: Diagnosis not present

## 2016-02-22 DIAGNOSIS — R262 Difficulty in walking, not elsewhere classified: Secondary | ICD-10-CM | POA: Diagnosis not present

## 2016-02-22 DIAGNOSIS — J449 Chronic obstructive pulmonary disease, unspecified: Secondary | ICD-10-CM | POA: Diagnosis not present

## 2016-02-22 DIAGNOSIS — D631 Anemia in chronic kidney disease: Secondary | ICD-10-CM | POA: Diagnosis not present

## 2016-02-22 DIAGNOSIS — M5030 Other cervical disc degeneration, unspecified cervical region: Secondary | ICD-10-CM | POA: Diagnosis not present

## 2016-02-27 DIAGNOSIS — M150 Primary generalized (osteo)arthritis: Secondary | ICD-10-CM | POA: Diagnosis not present

## 2016-02-27 DIAGNOSIS — M5030 Other cervical disc degeneration, unspecified cervical region: Secondary | ICD-10-CM | POA: Diagnosis not present

## 2016-02-27 DIAGNOSIS — I130 Hypertensive heart and chronic kidney disease with heart failure and stage 1 through stage 4 chronic kidney disease, or unspecified chronic kidney disease: Secondary | ICD-10-CM | POA: Diagnosis not present

## 2016-02-27 DIAGNOSIS — D631 Anemia in chronic kidney disease: Secondary | ICD-10-CM | POA: Diagnosis not present

## 2016-02-27 DIAGNOSIS — J449 Chronic obstructive pulmonary disease, unspecified: Secondary | ICD-10-CM | POA: Diagnosis not present

## 2016-02-27 DIAGNOSIS — I5032 Chronic diastolic (congestive) heart failure: Secondary | ICD-10-CM | POA: Diagnosis not present

## 2016-02-27 DIAGNOSIS — I739 Peripheral vascular disease, unspecified: Secondary | ICD-10-CM | POA: Diagnosis not present

## 2016-02-27 DIAGNOSIS — I872 Venous insufficiency (chronic) (peripheral): Secondary | ICD-10-CM | POA: Diagnosis not present

## 2016-02-27 DIAGNOSIS — R7303 Prediabetes: Secondary | ICD-10-CM | POA: Diagnosis not present

## 2016-02-27 DIAGNOSIS — H543 Unqualified visual loss, both eyes: Secondary | ICD-10-CM | POA: Diagnosis not present

## 2016-02-27 DIAGNOSIS — N184 Chronic kidney disease, stage 4 (severe): Secondary | ICD-10-CM | POA: Diagnosis not present

## 2016-02-27 DIAGNOSIS — R262 Difficulty in walking, not elsewhere classified: Secondary | ICD-10-CM | POA: Diagnosis not present

## 2016-02-28 DIAGNOSIS — I739 Peripheral vascular disease, unspecified: Secondary | ICD-10-CM | POA: Diagnosis not present

## 2016-02-28 DIAGNOSIS — J449 Chronic obstructive pulmonary disease, unspecified: Secondary | ICD-10-CM | POA: Diagnosis not present

## 2016-02-28 DIAGNOSIS — M5030 Other cervical disc degeneration, unspecified cervical region: Secondary | ICD-10-CM | POA: Diagnosis not present

## 2016-02-28 DIAGNOSIS — R7303 Prediabetes: Secondary | ICD-10-CM | POA: Diagnosis not present

## 2016-02-28 DIAGNOSIS — H543 Unqualified visual loss, both eyes: Secondary | ICD-10-CM | POA: Diagnosis not present

## 2016-02-28 DIAGNOSIS — I872 Venous insufficiency (chronic) (peripheral): Secondary | ICD-10-CM | POA: Diagnosis not present

## 2016-02-28 DIAGNOSIS — D631 Anemia in chronic kidney disease: Secondary | ICD-10-CM | POA: Diagnosis not present

## 2016-02-28 DIAGNOSIS — N184 Chronic kidney disease, stage 4 (severe): Secondary | ICD-10-CM | POA: Diagnosis not present

## 2016-02-28 DIAGNOSIS — I130 Hypertensive heart and chronic kidney disease with heart failure and stage 1 through stage 4 chronic kidney disease, or unspecified chronic kidney disease: Secondary | ICD-10-CM | POA: Diagnosis not present

## 2016-02-28 DIAGNOSIS — R262 Difficulty in walking, not elsewhere classified: Secondary | ICD-10-CM | POA: Diagnosis not present

## 2016-02-28 DIAGNOSIS — I5032 Chronic diastolic (congestive) heart failure: Secondary | ICD-10-CM | POA: Diagnosis not present

## 2016-02-28 DIAGNOSIS — M150 Primary generalized (osteo)arthritis: Secondary | ICD-10-CM | POA: Diagnosis not present

## 2016-03-02 DIAGNOSIS — I130 Hypertensive heart and chronic kidney disease with heart failure and stage 1 through stage 4 chronic kidney disease, or unspecified chronic kidney disease: Secondary | ICD-10-CM | POA: Diagnosis not present

## 2016-03-02 DIAGNOSIS — M150 Primary generalized (osteo)arthritis: Secondary | ICD-10-CM | POA: Diagnosis not present

## 2016-03-02 DIAGNOSIS — R7303 Prediabetes: Secondary | ICD-10-CM | POA: Diagnosis not present

## 2016-03-02 DIAGNOSIS — D631 Anemia in chronic kidney disease: Secondary | ICD-10-CM | POA: Diagnosis not present

## 2016-03-02 DIAGNOSIS — I5032 Chronic diastolic (congestive) heart failure: Secondary | ICD-10-CM | POA: Diagnosis not present

## 2016-03-02 DIAGNOSIS — N184 Chronic kidney disease, stage 4 (severe): Secondary | ICD-10-CM | POA: Diagnosis not present

## 2016-03-02 DIAGNOSIS — I872 Venous insufficiency (chronic) (peripheral): Secondary | ICD-10-CM | POA: Diagnosis not present

## 2016-03-02 DIAGNOSIS — R262 Difficulty in walking, not elsewhere classified: Secondary | ICD-10-CM | POA: Diagnosis not present

## 2016-03-02 DIAGNOSIS — J449 Chronic obstructive pulmonary disease, unspecified: Secondary | ICD-10-CM | POA: Diagnosis not present

## 2016-03-02 DIAGNOSIS — H543 Unqualified visual loss, both eyes: Secondary | ICD-10-CM | POA: Diagnosis not present

## 2016-03-02 DIAGNOSIS — M5030 Other cervical disc degeneration, unspecified cervical region: Secondary | ICD-10-CM | POA: Diagnosis not present

## 2016-03-02 DIAGNOSIS — I739 Peripheral vascular disease, unspecified: Secondary | ICD-10-CM | POA: Diagnosis not present

## 2016-03-05 DIAGNOSIS — M150 Primary generalized (osteo)arthritis: Secondary | ICD-10-CM | POA: Diagnosis not present

## 2016-03-05 DIAGNOSIS — R7303 Prediabetes: Secondary | ICD-10-CM | POA: Diagnosis not present

## 2016-03-05 DIAGNOSIS — M5030 Other cervical disc degeneration, unspecified cervical region: Secondary | ICD-10-CM | POA: Diagnosis not present

## 2016-03-05 DIAGNOSIS — J449 Chronic obstructive pulmonary disease, unspecified: Secondary | ICD-10-CM | POA: Diagnosis not present

## 2016-03-05 DIAGNOSIS — H543 Unqualified visual loss, both eyes: Secondary | ICD-10-CM | POA: Diagnosis not present

## 2016-03-05 DIAGNOSIS — I739 Peripheral vascular disease, unspecified: Secondary | ICD-10-CM | POA: Diagnosis not present

## 2016-03-05 DIAGNOSIS — I5032 Chronic diastolic (congestive) heart failure: Secondary | ICD-10-CM | POA: Diagnosis not present

## 2016-03-05 DIAGNOSIS — D631 Anemia in chronic kidney disease: Secondary | ICD-10-CM | POA: Diagnosis not present

## 2016-03-05 DIAGNOSIS — R262 Difficulty in walking, not elsewhere classified: Secondary | ICD-10-CM | POA: Diagnosis not present

## 2016-03-05 DIAGNOSIS — N184 Chronic kidney disease, stage 4 (severe): Secondary | ICD-10-CM | POA: Diagnosis not present

## 2016-03-05 DIAGNOSIS — I130 Hypertensive heart and chronic kidney disease with heart failure and stage 1 through stage 4 chronic kidney disease, or unspecified chronic kidney disease: Secondary | ICD-10-CM | POA: Diagnosis not present

## 2016-03-05 DIAGNOSIS — I872 Venous insufficiency (chronic) (peripheral): Secondary | ICD-10-CM | POA: Diagnosis not present

## 2016-03-06 ENCOUNTER — Other Ambulatory Visit: Payer: Self-pay

## 2016-03-06 MED ORDER — TRAMADOL HCL 50 MG PO TABS: 50.0000 mg | ORAL_TABLET | Freq: Two times a day (BID) | ORAL | Status: AC | PRN

## 2016-03-06 NOTE — Telephone Encounter (Addendum)
Rx faxed to [facility] [phone].  Ms Hazen notified.

## 2016-03-06 NOTE — Telephone Encounter (Signed)
Pt left /vm requesting refill tramadol to [facility]; call pt when refilled; last refilled # 180 on 11/18/15; pt last seen 01/10/16 for annual exam. Please advise.

## 2016-03-07 DIAGNOSIS — D631 Anemia in chronic kidney disease: Secondary | ICD-10-CM | POA: Diagnosis not present

## 2016-03-07 DIAGNOSIS — J449 Chronic obstructive pulmonary disease, unspecified: Secondary | ICD-10-CM | POA: Diagnosis not present

## 2016-03-07 DIAGNOSIS — H543 Unqualified visual loss, both eyes: Secondary | ICD-10-CM | POA: Diagnosis not present

## 2016-03-07 DIAGNOSIS — R7303 Prediabetes: Secondary | ICD-10-CM | POA: Diagnosis not present

## 2016-03-07 DIAGNOSIS — I872 Venous insufficiency (chronic) (peripheral): Secondary | ICD-10-CM | POA: Diagnosis not present

## 2016-03-07 DIAGNOSIS — I5032 Chronic diastolic (congestive) heart failure: Secondary | ICD-10-CM | POA: Diagnosis not present

## 2016-03-07 DIAGNOSIS — R262 Difficulty in walking, not elsewhere classified: Secondary | ICD-10-CM | POA: Diagnosis not present

## 2016-03-07 DIAGNOSIS — M150 Primary generalized (osteo)arthritis: Secondary | ICD-10-CM | POA: Diagnosis not present

## 2016-03-07 DIAGNOSIS — I739 Peripheral vascular disease, unspecified: Secondary | ICD-10-CM | POA: Diagnosis not present

## 2016-03-07 DIAGNOSIS — N184 Chronic kidney disease, stage 4 (severe): Secondary | ICD-10-CM | POA: Diagnosis not present

## 2016-03-07 DIAGNOSIS — I130 Hypertensive heart and chronic kidney disease with heart failure and stage 1 through stage 4 chronic kidney disease, or unspecified chronic kidney disease: Secondary | ICD-10-CM | POA: Diagnosis not present

## 2016-03-07 DIAGNOSIS — M5030 Other cervical disc degeneration, unspecified cervical region: Secondary | ICD-10-CM | POA: Diagnosis not present

## 2016-03-08 DIAGNOSIS — N184 Chronic kidney disease, stage 4 (severe): Secondary | ICD-10-CM | POA: Diagnosis not present

## 2016-03-08 DIAGNOSIS — R262 Difficulty in walking, not elsewhere classified: Secondary | ICD-10-CM | POA: Diagnosis not present

## 2016-03-08 DIAGNOSIS — I872 Venous insufficiency (chronic) (peripheral): Secondary | ICD-10-CM | POA: Diagnosis not present

## 2016-03-08 DIAGNOSIS — M150 Primary generalized (osteo)arthritis: Secondary | ICD-10-CM | POA: Diagnosis not present

## 2016-03-08 DIAGNOSIS — I739 Peripheral vascular disease, unspecified: Secondary | ICD-10-CM | POA: Diagnosis not present

## 2016-03-08 DIAGNOSIS — I5032 Chronic diastolic (congestive) heart failure: Secondary | ICD-10-CM | POA: Diagnosis not present

## 2016-03-08 DIAGNOSIS — R7303 Prediabetes: Secondary | ICD-10-CM | POA: Diagnosis not present

## 2016-03-08 DIAGNOSIS — I130 Hypertensive heart and chronic kidney disease with heart failure and stage 1 through stage 4 chronic kidney disease, or unspecified chronic kidney disease: Secondary | ICD-10-CM | POA: Diagnosis not present

## 2016-03-08 DIAGNOSIS — H543 Unqualified visual loss, both eyes: Secondary | ICD-10-CM | POA: Diagnosis not present

## 2016-03-08 DIAGNOSIS — D631 Anemia in chronic kidney disease: Secondary | ICD-10-CM | POA: Diagnosis not present

## 2016-03-08 DIAGNOSIS — M5030 Other cervical disc degeneration, unspecified cervical region: Secondary | ICD-10-CM | POA: Diagnosis not present

## 2016-03-08 DIAGNOSIS — J449 Chronic obstructive pulmonary disease, unspecified: Secondary | ICD-10-CM | POA: Diagnosis not present

## 2016-03-13 DIAGNOSIS — N184 Chronic kidney disease, stage 4 (severe): Secondary | ICD-10-CM | POA: Diagnosis not present

## 2016-03-13 DIAGNOSIS — M150 Primary generalized (osteo)arthritis: Secondary | ICD-10-CM | POA: Diagnosis not present

## 2016-03-13 DIAGNOSIS — H543 Unqualified visual loss, both eyes: Secondary | ICD-10-CM | POA: Diagnosis not present

## 2016-03-13 DIAGNOSIS — I739 Peripheral vascular disease, unspecified: Secondary | ICD-10-CM | POA: Diagnosis not present

## 2016-03-13 DIAGNOSIS — R262 Difficulty in walking, not elsewhere classified: Secondary | ICD-10-CM | POA: Diagnosis not present

## 2016-03-13 DIAGNOSIS — I130 Hypertensive heart and chronic kidney disease with heart failure and stage 1 through stage 4 chronic kidney disease, or unspecified chronic kidney disease: Secondary | ICD-10-CM | POA: Diagnosis not present

## 2016-03-13 DIAGNOSIS — I872 Venous insufficiency (chronic) (peripheral): Secondary | ICD-10-CM | POA: Diagnosis not present

## 2016-03-13 DIAGNOSIS — H903 Sensorineural hearing loss, bilateral: Secondary | ICD-10-CM | POA: Diagnosis not present

## 2016-03-13 DIAGNOSIS — I5032 Chronic diastolic (congestive) heart failure: Secondary | ICD-10-CM | POA: Diagnosis not present

## 2016-03-13 DIAGNOSIS — D631 Anemia in chronic kidney disease: Secondary | ICD-10-CM | POA: Diagnosis not present

## 2016-03-13 DIAGNOSIS — J449 Chronic obstructive pulmonary disease, unspecified: Secondary | ICD-10-CM | POA: Diagnosis not present

## 2016-03-13 DIAGNOSIS — R7303 Prediabetes: Secondary | ICD-10-CM | POA: Diagnosis not present

## 2016-03-13 DIAGNOSIS — M5030 Other cervical disc degeneration, unspecified cervical region: Secondary | ICD-10-CM | POA: Diagnosis not present

## 2016-03-15 DIAGNOSIS — R262 Difficulty in walking, not elsewhere classified: Secondary | ICD-10-CM | POA: Diagnosis not present

## 2016-03-15 DIAGNOSIS — N184 Chronic kidney disease, stage 4 (severe): Secondary | ICD-10-CM | POA: Diagnosis not present

## 2016-03-15 DIAGNOSIS — D631 Anemia in chronic kidney disease: Secondary | ICD-10-CM | POA: Diagnosis not present

## 2016-03-15 DIAGNOSIS — I130 Hypertensive heart and chronic kidney disease with heart failure and stage 1 through stage 4 chronic kidney disease, or unspecified chronic kidney disease: Secondary | ICD-10-CM | POA: Diagnosis not present

## 2016-03-15 DIAGNOSIS — M150 Primary generalized (osteo)arthritis: Secondary | ICD-10-CM | POA: Diagnosis not present

## 2016-03-15 DIAGNOSIS — I739 Peripheral vascular disease, unspecified: Secondary | ICD-10-CM | POA: Diagnosis not present

## 2016-03-15 DIAGNOSIS — J449 Chronic obstructive pulmonary disease, unspecified: Secondary | ICD-10-CM | POA: Diagnosis not present

## 2016-03-15 DIAGNOSIS — H543 Unqualified visual loss, both eyes: Secondary | ICD-10-CM | POA: Diagnosis not present

## 2016-03-15 DIAGNOSIS — R7303 Prediabetes: Secondary | ICD-10-CM | POA: Diagnosis not present

## 2016-03-15 DIAGNOSIS — M5030 Other cervical disc degeneration, unspecified cervical region: Secondary | ICD-10-CM | POA: Diagnosis not present

## 2016-03-15 DIAGNOSIS — I872 Venous insufficiency (chronic) (peripheral): Secondary | ICD-10-CM | POA: Diagnosis not present

## 2016-03-15 DIAGNOSIS — I5032 Chronic diastolic (congestive) heart failure: Secondary | ICD-10-CM | POA: Diagnosis not present

## 2016-03-20 ENCOUNTER — Other Ambulatory Visit: Payer: Self-pay | Admitting: *Deleted

## 2016-03-20 DIAGNOSIS — I5032 Chronic diastolic (congestive) heart failure: Secondary | ICD-10-CM | POA: Diagnosis not present

## 2016-03-20 DIAGNOSIS — I130 Hypertensive heart and chronic kidney disease with heart failure and stage 1 through stage 4 chronic kidney disease, or unspecified chronic kidney disease: Secondary | ICD-10-CM | POA: Diagnosis not present

## 2016-03-20 DIAGNOSIS — R262 Difficulty in walking, not elsewhere classified: Secondary | ICD-10-CM | POA: Diagnosis not present

## 2016-03-20 DIAGNOSIS — I739 Peripheral vascular disease, unspecified: Secondary | ICD-10-CM | POA: Diagnosis not present

## 2016-03-20 DIAGNOSIS — M150 Primary generalized (osteo)arthritis: Secondary | ICD-10-CM | POA: Diagnosis not present

## 2016-03-20 DIAGNOSIS — N184 Chronic kidney disease, stage 4 (severe): Secondary | ICD-10-CM | POA: Diagnosis not present

## 2016-03-20 DIAGNOSIS — R7303 Prediabetes: Secondary | ICD-10-CM | POA: Diagnosis not present

## 2016-03-20 DIAGNOSIS — D631 Anemia in chronic kidney disease: Secondary | ICD-10-CM | POA: Diagnosis not present

## 2016-03-20 DIAGNOSIS — J449 Chronic obstructive pulmonary disease, unspecified: Secondary | ICD-10-CM | POA: Diagnosis not present

## 2016-03-20 DIAGNOSIS — M5030 Other cervical disc degeneration, unspecified cervical region: Secondary | ICD-10-CM | POA: Diagnosis not present

## 2016-03-20 DIAGNOSIS — I872 Venous insufficiency (chronic) (peripheral): Secondary | ICD-10-CM | POA: Diagnosis not present

## 2016-03-20 DIAGNOSIS — H543 Unqualified visual loss, both eyes: Secondary | ICD-10-CM | POA: Diagnosis not present

## 2016-03-20 MED ORDER — FLUTICASONE PROPIONATE 50 MCG/ACT NA SUSP: 2.0000 | Freq: Every day | NASAL | Status: DC

## 2016-03-20 NOTE — Telephone Encounter (Signed)
Patient left voicemail requesting a refill on Flonase Last refill 04/20/14 48 G 3 refills Last office visit 01/10/16 Rx has not been filled in over a year

## 2016-03-21 NOTE — Telephone Encounter (Signed)
Pt request status of fluticasone refill; advised pt done on 03/20/16. Pt voiced understanding.

## 2016-03-22 DIAGNOSIS — R262 Difficulty in walking, not elsewhere classified: Secondary | ICD-10-CM | POA: Diagnosis not present

## 2016-03-22 DIAGNOSIS — D631 Anemia in chronic kidney disease: Secondary | ICD-10-CM | POA: Diagnosis not present

## 2016-03-22 DIAGNOSIS — H543 Unqualified visual loss, both eyes: Secondary | ICD-10-CM | POA: Diagnosis not present

## 2016-03-22 DIAGNOSIS — I130 Hypertensive heart and chronic kidney disease with heart failure and stage 1 through stage 4 chronic kidney disease, or unspecified chronic kidney disease: Secondary | ICD-10-CM | POA: Diagnosis not present

## 2016-03-22 DIAGNOSIS — R7303 Prediabetes: Secondary | ICD-10-CM | POA: Diagnosis not present

## 2016-03-22 DIAGNOSIS — M5030 Other cervical disc degeneration, unspecified cervical region: Secondary | ICD-10-CM | POA: Diagnosis not present

## 2016-03-22 DIAGNOSIS — I739 Peripheral vascular disease, unspecified: Secondary | ICD-10-CM | POA: Diagnosis not present

## 2016-03-22 DIAGNOSIS — I872 Venous insufficiency (chronic) (peripheral): Secondary | ICD-10-CM | POA: Diagnosis not present

## 2016-03-22 DIAGNOSIS — J449 Chronic obstructive pulmonary disease, unspecified: Secondary | ICD-10-CM | POA: Diagnosis not present

## 2016-03-22 DIAGNOSIS — N184 Chronic kidney disease, stage 4 (severe): Secondary | ICD-10-CM | POA: Diagnosis not present

## 2016-03-22 DIAGNOSIS — M150 Primary generalized (osteo)arthritis: Secondary | ICD-10-CM | POA: Diagnosis not present

## 2016-03-22 DIAGNOSIS — I5032 Chronic diastolic (congestive) heart failure: Secondary | ICD-10-CM | POA: Diagnosis not present

## 2016-03-23 DIAGNOSIS — J449 Chronic obstructive pulmonary disease, unspecified: Secondary | ICD-10-CM

## 2016-03-23 DIAGNOSIS — N184 Chronic kidney disease, stage 4 (severe): Secondary | ICD-10-CM

## 2016-03-23 DIAGNOSIS — R262 Difficulty in walking, not elsewhere classified: Secondary | ICD-10-CM | POA: Diagnosis not present

## 2016-03-23 DIAGNOSIS — I5032 Chronic diastolic (congestive) heart failure: Secondary | ICD-10-CM | POA: Diagnosis not present

## 2016-03-23 DIAGNOSIS — M150 Primary generalized (osteo)arthritis: Secondary | ICD-10-CM | POA: Diagnosis not present

## 2016-03-23 DIAGNOSIS — I130 Hypertensive heart and chronic kidney disease with heart failure and stage 1 through stage 4 chronic kidney disease, or unspecified chronic kidney disease: Secondary | ICD-10-CM | POA: Diagnosis not present

## 2016-03-27 ENCOUNTER — Telehealth: Payer: Self-pay | Admitting: Family Medicine

## 2016-03-27 DIAGNOSIS — M5030 Other cervical disc degeneration, unspecified cervical region: Secondary | ICD-10-CM | POA: Diagnosis not present

## 2016-03-27 DIAGNOSIS — D631 Anemia in chronic kidney disease: Secondary | ICD-10-CM | POA: Diagnosis not present

## 2016-03-27 DIAGNOSIS — N184 Chronic kidney disease, stage 4 (severe): Secondary | ICD-10-CM | POA: Diagnosis not present

## 2016-03-27 DIAGNOSIS — I130 Hypertensive heart and chronic kidney disease with heart failure and stage 1 through stage 4 chronic kidney disease, or unspecified chronic kidney disease: Secondary | ICD-10-CM | POA: Diagnosis not present

## 2016-03-27 DIAGNOSIS — M150 Primary generalized (osteo)arthritis: Secondary | ICD-10-CM | POA: Diagnosis not present

## 2016-03-27 DIAGNOSIS — R262 Difficulty in walking, not elsewhere classified: Secondary | ICD-10-CM | POA: Diagnosis not present

## 2016-03-27 DIAGNOSIS — I5032 Chronic diastolic (congestive) heart failure: Secondary | ICD-10-CM | POA: Diagnosis not present

## 2016-03-27 DIAGNOSIS — J449 Chronic obstructive pulmonary disease, unspecified: Secondary | ICD-10-CM | POA: Diagnosis not present

## 2016-03-27 DIAGNOSIS — R7303 Prediabetes: Secondary | ICD-10-CM | POA: Diagnosis not present

## 2016-03-27 DIAGNOSIS — H543 Unqualified visual loss, both eyes: Secondary | ICD-10-CM | POA: Diagnosis not present

## 2016-03-27 DIAGNOSIS — I872 Venous insufficiency (chronic) (peripheral): Secondary | ICD-10-CM | POA: Diagnosis not present

## 2016-03-27 DIAGNOSIS — I739 Peripheral vascular disease, unspecified: Secondary | ICD-10-CM | POA: Diagnosis not present

## 2016-03-27 NOTE — Telephone Encounter (Signed)
Kelly @ [facility] needs a verbal order For PT twice a week for 3 weeks

## 2016-03-27 NOTE — Telephone Encounter (Signed)
Verbal orders given to [facility] with [facility]  for PT 2 x week for 3 weeks.

## 2016-03-29 DIAGNOSIS — I130 Hypertensive heart and chronic kidney disease with heart failure and stage 1 through stage 4 chronic kidney disease, or unspecified chronic kidney disease: Secondary | ICD-10-CM | POA: Diagnosis not present

## 2016-03-29 DIAGNOSIS — R262 Difficulty in walking, not elsewhere classified: Secondary | ICD-10-CM | POA: Diagnosis not present

## 2016-03-29 DIAGNOSIS — N184 Chronic kidney disease, stage 4 (severe): Secondary | ICD-10-CM | POA: Diagnosis not present

## 2016-03-29 DIAGNOSIS — M150 Primary generalized (osteo)arthritis: Secondary | ICD-10-CM | POA: Diagnosis not present

## 2016-03-29 DIAGNOSIS — M5030 Other cervical disc degeneration, unspecified cervical region: Secondary | ICD-10-CM | POA: Diagnosis not present

## 2016-03-29 DIAGNOSIS — I739 Peripheral vascular disease, unspecified: Secondary | ICD-10-CM | POA: Diagnosis not present

## 2016-03-29 DIAGNOSIS — D631 Anemia in chronic kidney disease: Secondary | ICD-10-CM | POA: Diagnosis not present

## 2016-03-29 DIAGNOSIS — H543 Unqualified visual loss, both eyes: Secondary | ICD-10-CM | POA: Diagnosis not present

## 2016-03-29 DIAGNOSIS — I5032 Chronic diastolic (congestive) heart failure: Secondary | ICD-10-CM | POA: Diagnosis not present

## 2016-03-29 DIAGNOSIS — J449 Chronic obstructive pulmonary disease, unspecified: Secondary | ICD-10-CM | POA: Diagnosis not present

## 2016-03-29 DIAGNOSIS — I872 Venous insufficiency (chronic) (peripheral): Secondary | ICD-10-CM | POA: Diagnosis not present

## 2016-03-29 DIAGNOSIS — R7303 Prediabetes: Secondary | ICD-10-CM | POA: Diagnosis not present

## 2016-03-30 ENCOUNTER — Other Ambulatory Visit: Payer: Self-pay | Admitting: Cardiovascular Disease

## 2016-04-02 DIAGNOSIS — M150 Primary generalized (osteo)arthritis: Secondary | ICD-10-CM | POA: Diagnosis not present

## 2016-04-02 DIAGNOSIS — R7303 Prediabetes: Secondary | ICD-10-CM | POA: Diagnosis not present

## 2016-04-02 DIAGNOSIS — I739 Peripheral vascular disease, unspecified: Secondary | ICD-10-CM | POA: Diagnosis not present

## 2016-04-02 DIAGNOSIS — I5032 Chronic diastolic (congestive) heart failure: Secondary | ICD-10-CM | POA: Diagnosis not present

## 2016-04-02 DIAGNOSIS — I130 Hypertensive heart and chronic kidney disease with heart failure and stage 1 through stage 4 chronic kidney disease, or unspecified chronic kidney disease: Secondary | ICD-10-CM | POA: Diagnosis not present

## 2016-04-02 DIAGNOSIS — N184 Chronic kidney disease, stage 4 (severe): Secondary | ICD-10-CM | POA: Diagnosis not present

## 2016-04-02 DIAGNOSIS — I872 Venous insufficiency (chronic) (peripheral): Secondary | ICD-10-CM | POA: Diagnosis not present

## 2016-04-02 DIAGNOSIS — R262 Difficulty in walking, not elsewhere classified: Secondary | ICD-10-CM | POA: Diagnosis not present

## 2016-04-02 DIAGNOSIS — H543 Unqualified visual loss, both eyes: Secondary | ICD-10-CM | POA: Diagnosis not present

## 2016-04-02 DIAGNOSIS — M5030 Other cervical disc degeneration, unspecified cervical region: Secondary | ICD-10-CM | POA: Diagnosis not present

## 2016-04-02 DIAGNOSIS — D631 Anemia in chronic kidney disease: Secondary | ICD-10-CM | POA: Diagnosis not present

## 2016-04-02 DIAGNOSIS — J449 Chronic obstructive pulmonary disease, unspecified: Secondary | ICD-10-CM | POA: Diagnosis not present

## 2016-04-04 DIAGNOSIS — R262 Difficulty in walking, not elsewhere classified: Secondary | ICD-10-CM | POA: Diagnosis not present

## 2016-04-04 DIAGNOSIS — I872 Venous insufficiency (chronic) (peripheral): Secondary | ICD-10-CM | POA: Diagnosis not present

## 2016-04-04 DIAGNOSIS — I5032 Chronic diastolic (congestive) heart failure: Secondary | ICD-10-CM | POA: Diagnosis not present

## 2016-04-04 DIAGNOSIS — R7303 Prediabetes: Secondary | ICD-10-CM | POA: Diagnosis not present

## 2016-04-04 DIAGNOSIS — I739 Peripheral vascular disease, unspecified: Secondary | ICD-10-CM | POA: Diagnosis not present

## 2016-04-04 DIAGNOSIS — J449 Chronic obstructive pulmonary disease, unspecified: Secondary | ICD-10-CM | POA: Diagnosis not present

## 2016-04-04 DIAGNOSIS — I130 Hypertensive heart and chronic kidney disease with heart failure and stage 1 through stage 4 chronic kidney disease, or unspecified chronic kidney disease: Secondary | ICD-10-CM | POA: Diagnosis not present

## 2016-04-04 DIAGNOSIS — D631 Anemia in chronic kidney disease: Secondary | ICD-10-CM | POA: Diagnosis not present

## 2016-04-04 DIAGNOSIS — H543 Unqualified visual loss, both eyes: Secondary | ICD-10-CM | POA: Diagnosis not present

## 2016-04-04 DIAGNOSIS — M5030 Other cervical disc degeneration, unspecified cervical region: Secondary | ICD-10-CM | POA: Diagnosis not present

## 2016-04-04 DIAGNOSIS — N184 Chronic kidney disease, stage 4 (severe): Secondary | ICD-10-CM | POA: Diagnosis not present

## 2016-04-04 DIAGNOSIS — M150 Primary generalized (osteo)arthritis: Secondary | ICD-10-CM | POA: Diagnosis not present

## 2016-04-09 DIAGNOSIS — H543 Unqualified visual loss, both eyes: Secondary | ICD-10-CM | POA: Diagnosis not present

## 2016-04-09 DIAGNOSIS — M150 Primary generalized (osteo)arthritis: Secondary | ICD-10-CM | POA: Diagnosis not present

## 2016-04-09 DIAGNOSIS — I872 Venous insufficiency (chronic) (peripheral): Secondary | ICD-10-CM | POA: Diagnosis not present

## 2016-04-09 DIAGNOSIS — D631 Anemia in chronic kidney disease: Secondary | ICD-10-CM | POA: Diagnosis not present

## 2016-04-09 DIAGNOSIS — I130 Hypertensive heart and chronic kidney disease with heart failure and stage 1 through stage 4 chronic kidney disease, or unspecified chronic kidney disease: Secondary | ICD-10-CM | POA: Diagnosis not present

## 2016-04-09 DIAGNOSIS — R262 Difficulty in walking, not elsewhere classified: Secondary | ICD-10-CM | POA: Diagnosis not present

## 2016-04-09 DIAGNOSIS — I5032 Chronic diastolic (congestive) heart failure: Secondary | ICD-10-CM | POA: Diagnosis not present

## 2016-04-09 DIAGNOSIS — M5030 Other cervical disc degeneration, unspecified cervical region: Secondary | ICD-10-CM | POA: Diagnosis not present

## 2016-04-09 DIAGNOSIS — I739 Peripheral vascular disease, unspecified: Secondary | ICD-10-CM | POA: Diagnosis not present

## 2016-04-09 DIAGNOSIS — R7303 Prediabetes: Secondary | ICD-10-CM | POA: Diagnosis not present

## 2016-04-09 DIAGNOSIS — J449 Chronic obstructive pulmonary disease, unspecified: Secondary | ICD-10-CM | POA: Diagnosis not present

## 2016-04-09 DIAGNOSIS — N184 Chronic kidney disease, stage 4 (severe): Secondary | ICD-10-CM | POA: Diagnosis not present

## 2016-04-11 DIAGNOSIS — R7303 Prediabetes: Secondary | ICD-10-CM | POA: Diagnosis not present

## 2016-04-11 DIAGNOSIS — R262 Difficulty in walking, not elsewhere classified: Secondary | ICD-10-CM | POA: Diagnosis not present

## 2016-04-11 DIAGNOSIS — I130 Hypertensive heart and chronic kidney disease with heart failure and stage 1 through stage 4 chronic kidney disease, or unspecified chronic kidney disease: Secondary | ICD-10-CM | POA: Diagnosis not present

## 2016-04-11 DIAGNOSIS — D631 Anemia in chronic kidney disease: Secondary | ICD-10-CM | POA: Diagnosis not present

## 2016-04-11 DIAGNOSIS — M150 Primary generalized (osteo)arthritis: Secondary | ICD-10-CM | POA: Diagnosis not present

## 2016-04-11 DIAGNOSIS — M5030 Other cervical disc degeneration, unspecified cervical region: Secondary | ICD-10-CM | POA: Diagnosis not present

## 2016-04-11 DIAGNOSIS — I739 Peripheral vascular disease, unspecified: Secondary | ICD-10-CM | POA: Diagnosis not present

## 2016-04-11 DIAGNOSIS — I872 Venous insufficiency (chronic) (peripheral): Secondary | ICD-10-CM | POA: Diagnosis not present

## 2016-04-11 DIAGNOSIS — J449 Chronic obstructive pulmonary disease, unspecified: Secondary | ICD-10-CM | POA: Diagnosis not present

## 2016-04-11 DIAGNOSIS — H543 Unqualified visual loss, both eyes: Secondary | ICD-10-CM | POA: Diagnosis not present

## 2016-04-11 DIAGNOSIS — N184 Chronic kidney disease, stage 4 (severe): Secondary | ICD-10-CM | POA: Diagnosis not present

## 2016-04-11 DIAGNOSIS — I5032 Chronic diastolic (congestive) heart failure: Secondary | ICD-10-CM | POA: Diagnosis not present

## 2016-04-12 ENCOUNTER — Ambulatory Visit (INDEPENDENT_AMBULATORY_CARE_PROVIDER_SITE_OTHER): Payer: Medicare Other | Admitting: Family Medicine

## 2016-04-12 ENCOUNTER — Encounter: Payer: Self-pay | Admitting: Family Medicine

## 2016-04-12 VITALS — BP 136/79 | HR 64 | Temp 98.6°F | Ht 61.0 in | Wt 124.5 lb

## 2016-04-12 DIAGNOSIS — Z1211 Encounter for screening for malignant neoplasm of colon: Secondary | ICD-10-CM

## 2016-04-12 DIAGNOSIS — M25561 Pain in right knee: Secondary | ICD-10-CM

## 2016-04-12 DIAGNOSIS — K921 Melena: Secondary | ICD-10-CM | POA: Diagnosis not present

## 2016-04-12 DIAGNOSIS — K5900 Constipation, unspecified: Secondary | ICD-10-CM

## 2016-04-12 DIAGNOSIS — R918 Other nonspecific abnormal finding of lung field: Secondary | ICD-10-CM | POA: Insufficient documentation

## 2016-04-12 DIAGNOSIS — I10 Essential (primary) hypertension: Secondary | ICD-10-CM

## 2016-04-12 DIAGNOSIS — K5909 Other constipation: Secondary | ICD-10-CM | POA: Insufficient documentation

## 2016-04-12 DIAGNOSIS — D62 Acute posthemorrhagic anemia: Secondary | ICD-10-CM | POA: Insufficient documentation

## 2016-04-12 NOTE — Assessment & Plan Note (Addendum)
Unclear cause. On PPI  Not a great candidate for endoscopy to eval upper GI bleed further.  Need to re-eval hemoglobin to determine if bleeding continues.. Pt does note some dark stools.

## 2016-04-12 NOTE — Progress Notes (Signed)
Pre visit review using our clinic review tool, if applicable. No additional management support is needed unless otherwise documented below in the visit note. 

## 2016-04-12 NOTE — Progress Notes (Signed)
Subjective:    Patient ID: Elizabeth Silva, female    DOB: Mar 05, 1918, 82 y.o.   MRN: [id number]  HPI  82 year old female patient presents for routine follow up. In the interim she was hospitalized in 01/12/2016 for melena, upper GI bleed and hypoxia. Hb decreased to 9.3 from 12.1.  Per GI, - Monitor Hgb, transfuse if <7 Treated with IV Protonix '40mg'$  q 12hrs, change to po bid. - Patient is a poor candidate for EGD d/t age and comorbidities  Abnormal chest x-ray with bilateral nodules. History of bilateral pulmonary nodules per CT done in january discontinued.  Renal function was stable.ary 2017, suspicious for malignancy but patient's family refused for further workup.   HTN was stable, but diastolic BP low so hydralazine.  In May she had orthostatic hypotension, fell after standing... Amlodipine at night was discontinued by Dr. Rockey Situ. Daytime dose was continued. BP Readings from Last 3 Encounters:  04/12/16 136/79  01/13/16 125/32  01/10/16 124/64   Today she reports:  BP now running stable, no further falls.   She does  Still have dark stools, she has some constipation if miralax. She had felt this was the iron. She is taking pantoprazole daily. No regualr abdominal pain, no epigastric pain.  She is back from rehab now and ready  She is doing PT to get stronger now.   Wt Readings from Last 3 Encounters:  04/12/16 124 lb 8 oz (56.473 kg)  01/13/16 128 lb (58.06 kg)  01/10/16 119 lb 12 oz (54.318 kg)    COPD, pulmonary nodules possible cancer: Family is refusing further eval. He breathing is stable over all.  Social History /Family History/Past Medical History reviewed and updated if needed. No history of PUD or GI bleeding.  Review of Systems  Constitutional: Negative for fever and fatigue.  HENT: Negative for ear pain.   Eyes: Negative for pain.  Respiratory: Negative for chest tightness and shortness of breath.   Cardiovascular: Negative for chest pain,  palpitations and leg swelling.  Gastrointestinal: Negative for abdominal pain.  Genitourinary: Negative for dysuria.       Objective:   Physical Exam  Constitutional: Vital signs are normal. She appears well-developed and well-nourished. She is cooperative.  Non-toxic appearance. She does not appear ill. No distress.  HENT:  Head: Normocephalic.  Right Ear: Hearing, tympanic membrane, external ear and ear canal normal. Tympanic membrane is not erythematous, not retracted and not bulging.  Left Ear: Hearing, tympanic membrane, external ear and ear canal normal. Tympanic membrane is not erythematous, not retracted and not bulging.  Nose: No mucosal edema or rhinorrhea. Right sinus exhibits no maxillary sinus tenderness and no frontal sinus tenderness. Left sinus exhibits no maxillary sinus tenderness and no frontal sinus tenderness.  Mouth/Throat: Uvula is midline, oropharynx is clear and moist and mucous membranes are normal.  Eyes: Conjunctivae, EOM and lids are normal. Pupils are equal, round, and reactive to light. Lids are everted and swept, no foreign bodies found.  Neck: Trachea normal and normal range of motion. Neck supple. Carotid bruit is not present. No thyroid mass and no thyromegaly present.  Cardiovascular: Normal rate, regular rhythm, S1 normal, S2 normal, normal heart sounds, intact distal pulses and normal pulses.  Exam reveals no gallop and no friction rub.   No murmur heard. Pulmonary/Chest: Effort normal and breath sounds normal. No tachypnea. No respiratory distress. She has no decreased breath sounds. She has no wheezes. She has no rhonchi. She has no rales.  Abdominal: Soft. Normal appearance and bowel sounds are normal. There is no tenderness.  Neurological: She is alert.  Skin: Skin is warm, dry and intact. No rash noted.  Psychiatric: Her speech is normal and behavior is normal. Judgment and thought content normal. Her mood appears not anxious. Cognition and memory are  normal. She does not exhibit a depressed mood.          Assessment & Plan:

## 2016-04-12 NOTE — Assessment & Plan Note (Signed)
Refer to sports med for further evaluation and likely steroid injections.  NSAIDs contraindicated given recent GI bleed.

## 2016-04-12 NOTE — Assessment & Plan Note (Signed)
Well controlled. Continue current medication.  

## 2016-04-12 NOTE — Assessment & Plan Note (Signed)
Lab on way out today. Pt on 325 mg iron daily.

## 2016-04-12 NOTE — Patient Instructions (Signed)
Set up appointment with Dr. Lorelei Pont for evaluation on right knee pain.  Stop at lab on way out.  Avoid ibuprofen, aleve.

## 2016-04-12 NOTE — Assessment & Plan Note (Signed)
Multifactorial.  Will stop iron once able.  Miralax as needed.

## 2016-04-13 LAB — CBC WITH DIFFERENTIAL/PLATELET
BASOS ABS: 0.1 10*3/uL (ref 0.0–0.1)
Basophils Relative: 0.6 % (ref 0.0–3.0)
Eosinophils Absolute: 0.4 10*3/uL (ref 0.0–0.7)
Eosinophils Relative: 4.3 % (ref 0.0–5.0)
HEMATOCRIT: 32.3 % — AB (ref 36.0–46.0)
Hemoglobin: 10.5 g/dL — ABNORMAL LOW (ref 12.0–15.0)
LYMPHS ABS: 1.5 10*3/uL (ref 0.7–4.0)
Lymphocytes Relative: 17.2 % (ref 12.0–46.0)
MCHC: 32.5 g/dL (ref 30.0–36.0)
MCV: 88.5 fl (ref 78.0–100.0)
MONOS PCT: 12.1 % — AB (ref 3.0–12.0)
Monocytes Absolute: 1.1 10*3/uL — ABNORMAL HIGH (ref 0.1–1.0)
NEUTROS PCT: 65.8 % (ref 43.0–77.0)
Neutro Abs: 5.8 10*3/uL (ref 1.4–7.7)
Platelets: 258 10*3/uL (ref 150.0–400.0)
RBC: 3.66 Mil/uL — AB (ref 3.87–5.11)
RDW: 16.6 % — ABNORMAL HIGH (ref 11.5–15.5)
WBC: 8.8 10*3/uL (ref 4.0–10.5)

## 2016-04-13 LAB — IBC PANEL
Iron: 15 ug/dL — ABNORMAL LOW (ref 42–145)
SATURATION RATIOS: 5.4 % — AB (ref 20.0–50.0)
Transferrin: 200 mg/dL — ABNORMAL LOW (ref 212.0–360.0)

## 2016-04-13 LAB — FERRITIN: Ferritin: 34 ng/mL (ref 10.0–291.0)

## 2016-04-16 DIAGNOSIS — I130 Hypertensive heart and chronic kidney disease with heart failure and stage 1 through stage 4 chronic kidney disease, or unspecified chronic kidney disease: Secondary | ICD-10-CM | POA: Diagnosis not present

## 2016-04-16 DIAGNOSIS — M150 Primary generalized (osteo)arthritis: Secondary | ICD-10-CM | POA: Diagnosis not present

## 2016-04-16 DIAGNOSIS — I739 Peripheral vascular disease, unspecified: Secondary | ICD-10-CM | POA: Diagnosis not present

## 2016-04-16 DIAGNOSIS — J449 Chronic obstructive pulmonary disease, unspecified: Secondary | ICD-10-CM | POA: Diagnosis not present

## 2016-04-16 DIAGNOSIS — D631 Anemia in chronic kidney disease: Secondary | ICD-10-CM | POA: Diagnosis not present

## 2016-04-16 DIAGNOSIS — R7303 Prediabetes: Secondary | ICD-10-CM | POA: Diagnosis not present

## 2016-04-16 DIAGNOSIS — I5032 Chronic diastolic (congestive) heart failure: Secondary | ICD-10-CM | POA: Diagnosis not present

## 2016-04-16 DIAGNOSIS — N184 Chronic kidney disease, stage 4 (severe): Secondary | ICD-10-CM | POA: Diagnosis not present

## 2016-04-16 DIAGNOSIS — M5030 Other cervical disc degeneration, unspecified cervical region: Secondary | ICD-10-CM | POA: Diagnosis not present

## 2016-04-16 DIAGNOSIS — I872 Venous insufficiency (chronic) (peripheral): Secondary | ICD-10-CM | POA: Diagnosis not present

## 2016-04-16 DIAGNOSIS — R262 Difficulty in walking, not elsewhere classified: Secondary | ICD-10-CM | POA: Diagnosis not present

## 2016-04-16 DIAGNOSIS — H543 Unqualified visual loss, both eyes: Secondary | ICD-10-CM | POA: Diagnosis not present

## 2016-04-18 ENCOUNTER — Ambulatory Visit (INDEPENDENT_AMBULATORY_CARE_PROVIDER_SITE_OTHER): Payer: Medicare Other | Admitting: Family Medicine

## 2016-04-18 ENCOUNTER — Encounter: Payer: Self-pay | Admitting: Family Medicine

## 2016-04-18 VITALS — BP 137/68 | HR 65 | Temp 98.3°F | Ht 61.0 in | Wt 124.5 lb

## 2016-04-18 DIAGNOSIS — M170 Bilateral primary osteoarthritis of knee: Secondary | ICD-10-CM

## 2016-04-18 DIAGNOSIS — M129 Arthropathy, unspecified: Secondary | ICD-10-CM | POA: Diagnosis not present

## 2016-04-18 DIAGNOSIS — M25561 Pain in right knee: Secondary | ICD-10-CM

## 2016-04-18 DIAGNOSIS — M25562 Pain in left knee: Secondary | ICD-10-CM

## 2016-04-18 MED ORDER — METHYLPREDNISOLONE ACETATE 40 MG/ML IJ SUSP
80.0000 mg | Freq: Once | INTRAMUSCULAR | Status: AC
  Administered 2016-04-18: 80 mg via INTRAMUSCULAR

## 2016-04-18 MED ORDER — METHYLPREDNISOLONE ACETATE 40 MG/ML IJ SUSP
80.0000 mg | Freq: Once | INTRAMUSCULAR | Status: AC
  Administered 2016-04-18: 80 mg via INTRA_ARTICULAR

## 2016-04-18 NOTE — Progress Notes (Signed)
Dr. Frederico Hamman T. Aurianna Earlywine, MD, [facility] [place], [address] Phone: [phone] Fax: [phone]  04/18/2016  Patient: Elizabeth Silva, MRN: [id number], DOB: Feb 28, 1918, 82 y.o.  Primary Physician:  Eliezer Lofts, MD   Chief Complaint  Patient presents with  . Knee Pain    Bilateral   Subjective:   Elizabeth Silva is a 82 y.o. very pleasant female patient who presents with the following:  R > L knee pain.   Patient has known bilateral knee osteoarthritis. She is here for further evaluation and treatment with her daughter. She is 82 years old, and she is generally quite active for age. She is fairly functional, but she has recently transitioned to an assisted living facility.  She has right greater than left knee arthritis and pain that limits her functionally.  Past Medical History, Surgical History, Social History, Family History, Problem List, Medications, and Allergies have been reviewed and updated if relevant.  Patient Active Problem List   Diagnosis Date Noted  . Multiple pulmonary nodules 04/12/2016  . Acute blood loss anemia 04/12/2016  . Chronic constipation 04/12/2016  . Melena 01/12/2016  . Abnormal chest x-ray 01/12/2016  . Right knee pain 01/10/2016  . Advance directive discussed with patient 01/10/2016  . Dysphagia 10/27/2015  . Chest tightness 10/04/2015  . Vision loss, bilateral 10/26/2014  . Carotid stenosis 10/26/2014  . Bilateral tinnitus 04/20/2014  . Prediabetes 01/07/2014  . Bilateral knee pain 01/07/2014  . Hip pain, left 01/07/2014  . COPD exacerbation ([place]) 10/14/2013  . Fatigue 08/28/2013  . Insomnia 01/02/2013  . Anemia of chronic renal failure 12/25/2010  . CONSTIPATION 12/08/2010  . DIASTOLIC HEART FAILURE, CHRONIC 03/02/2010  . CAROTID BRUIT, RIGHT 03/02/2010  . INCONTINENCE, URGE 01/25/2010  . PERIPHERAL NEUROPATHY, IDIOPATHIC 05/10/2008  . DYSURIA, CHRONIC 02/27/2008  .  PERIPHERAL VASCULAR DISEASE 05/05/2007  . Chronic venous insufficiency 05/05/2007  . ALLERGIC RHINITIS 05/05/2007  . COPD, mild ([place]) 05/05/2007  . GERD 05/05/2007  . DIVERTICULOSIS, COLON 05/05/2007  . Osteoarthritis, multiple sites 05/05/2007  . DEGENERATION, CERVICAL DISC 05/05/2007  . STRESS INCONTINENCE 05/05/2007  . HYPERCHOLESTEROLEMIA, PURE 04/07/2007  . RENAL STONE 04/07/2007  . Essential hypertension, benign 03/17/2007  . Chronic kidney disease, stage 4, severely decreased GFR ([place]) 03/17/2007  . CARDIAC MURMUR 03/17/2007    Past Medical History  Diagnosis Date  . Allergic rhinitis   . COPD (chronic obstructive pulmonary disease) ([place])   . Diverticulosis of colon   . GERD (gastroesophageal reflux disease)   . Hyperlipidemia   . Hypertension     LE swelling with amlodipine, intolerant of ACEIs  . Osteoarthritis   . CKD (chronic kidney disease)   . Impaired vision     right eye, was told due to HTN  . LBBB (left bundle branch block)   . Diastolic CHF, acute ([facility])     echo (4/11) with EF 60-65%,mild LVH, mild aortic insufficiency, mild MR, PA systolic pressure 46-50 mmHg, mild to moderate TR  . History of PFTs   . H/O Clostridium difficile infection Sept. 2013  . GI bleed     Past Surgical History  Procedure Laterality Date  . Cardiac catheterization  1999    normal  . Total abdominal hysterectomy  1970    suspicious cells  . Cholecystectomy  1980  . Cervical disc surgery  2005    cerv. disc, dz. C5-C7, facet arthritis    Social History  Social History  . Marital Status: Widowed    Spouse Name: N/A  . Number of Children: N/A  . Years of Education: N/A   Occupational History  . Not on file.   Social History Main Topics  . Smoking status: Former Smoker -- 1.00 packs/day    Types: Cigarettes    Quit date: 10/15/1984  . Smokeless tobacco: Never Used  . Alcohol Use: 0.0 oz/week    0 Standard drinks or equivalent per week  . Drug Use: No  . Sexual  Activity: Not on file   Other Topics Concern  . Not on file   Social History Narrative   No living will, HCPOA: daughter Wilburn Mylar    Family History  Problem Relation Age of Onset  . Heart block Mother     complete  . Dementia Father   . Colon cancer Sister   . Dementia Sister   . Lung cancer Brother   . Cancer Brother     sinus  . Diabetes Brother     Allergies  Allergen Reactions  . Levofloxacin     REACTION: Burning in legs  . Penicillins     REACTION: Rash    Medication list reviewed and updated in full in [facility].  GEN: No fevers, chills. Nontoxic. Primarily MSK c/o today. MSK: Detailed in the HPI GI: tolerating PO intake without difficulty Neuro: No numbness, parasthesias, or tingling associated. Otherwise the pertinent positives of the ROS are noted above.   Objective:   BP 137/68 mmHg  Pulse 65  Temp(Src) 98.3 F (36.8 C) (Oral)  Ht '5\' 1"'$  (1.549 m)  Wt 124 lb 8 oz (56.473 kg)  BMI 23.54 kg/m2   GEN: WDWN, NAD, Non-toxic, Alert & Oriented x 3 HEENT: Atraumatic, Normocephalic.  Ears and Nose: No external deformity. EXTR: No clubbing/cyanosis/edema NEURO: Normal gait. antalgic PSYCH: Normally interactive. Conversant. Not depressed or anxious appearing.  Calm demeanor.    Bilateral knees lack 2 of extension. Flexion to 115. Tenderness bilaterally at the medial greater than lateral joint lines. Right is worse in the left in this regard. Stable MCL and LCL. Lachman is negative. PCL was intact.  Relative pain with McMurray's without mechanical symptoms.  Radiology: No results found.  Assessment and Plan:   Arthritis of both knees  Arthralgia of both knees - Plan: methylPREDNISolone acetate (DEPO-MEDROL) injection 80 mg, methylPREDNISolone acetate (DEPO-MEDROL) injection 80 mg  Continue with tramadol and Tylenol as needed for pain. Avoid NSAIDs in this patient.  Knee Injection, RIGHT Patient verbally consented to procedure. Risks  (including potential rare risk of infection), benefits, and alternatives explained. Sterilely prepped with Chloraprep. Ethyl cholride used for anesthesia. 8 cc Lidocaine 1% mixed with 2 mL Depo-Medrol 40 mg injected using the anteromedial approach without difficulty. No complications with procedure and tolerated well. Patient had decreased pain post-injection.   Knee Injection, LEFT Patient verbally consented to procedure. Risks (including potential rare risk of infection), benefits, and alternatives explained. Sterilely prepped with Chloraprep. Ethyl cholride used for anesthesia. 8 cc Lidocaine 1% mixed with 2 mL Depo-Medrol 40 mg injected using the anteromedial approach without difficulty. No complications with procedure and tolerated well. Patient had decreased pain post-injection.     Follow-up: No Follow-up on file.  Signed,  Maud Deed. Raziel Koenigs, MD   Patient's Medications  New Prescriptions   No medications on file  Previous Medications   ACETAMINOPHEN (TYLENOL) 500 MG TABLET    Take 500 mg by mouth every 6 (six) hours  as needed.     ACIDOPHILUS (RISAQUAD) CAPS CAPSULE    Take 1 capsule by mouth daily.   ADVAIR DISKUS 250-50 MCG/DOSE AEPB    Inhale 1 puff into the  lungs two times daily   ALBUTEROL (PROAIR HFA) 108 (90 BASE) MCG/ACT INHALER    Inhale 2 puffs into the lungs every 6 (six) hours as needed for wheezing or shortness of breath.   AMLODIPINE (NORVASC) 5 MG TABLET    Take 1 tablet by mouth two  times daily   ASPIRIN 81 MG EC TABLET    Take 81 mg by mouth daily.     BYSTOLIC 10 MG TABLET    Take 1 tablet by mouth  daily   FERROUS FUMARATE (HEMOCYTE - 106 MG FE) 325 (106 FE) MG TABS TABLET    Take 1 tablet (106 mg of iron total) by mouth daily.   FLUTICASONE (FLONASE) 50 MCG/ACT NASAL SPRAY    Place 2 sprays into both nostrils daily.   FUROSEMIDE (LASIX) 20 MG TABLET    Take 1 tablet (20 mg total) by mouth daily as needed.   GABAPENTIN (NEURONTIN) 600 MG TABLET    Take 1 tablet  by mouth at  bedtime   HYDRALAZINE (APRESOLINE) 100 MG TABLET    Take 1 tablet (100 mg total) by mouth 3 (three) times daily.   LOVASTATIN (MEVACOR) 20 MG TABLET    Take 1 tablet by mouth at  bedtime   OXYBUTYNIN (DITROPAN) 5 MG TABLET    Take 1 tablet by mouth two  times daily   PANTOPRAZOLE (PROTONIX) 40 MG TABLET    Take 1 tablet (40 mg total) by mouth daily.   SPIRIVA HANDIHALER 18 MCG INHALATION CAPSULE    Inhale the contents of 1  capsule via HandiHaler  daily   TRAMADOL (ULTRAM) 50 MG TABLET    Take 1 tablet (50 mg total) by mouth 2 (two) times daily as needed for moderate pain.   VITAMIN E 1000 UNIT CAPSULE    Take 1,000 Units by mouth daily.    Modified Medications   No medications on file  Discontinued Medications   No medications on file

## 2016-04-18 NOTE — Progress Notes (Signed)
Pre visit review using our clinic review tool, if applicable. No additional management support is needed unless otherwise documented below in the visit note. 

## 2016-04-26 ENCOUNTER — Other Ambulatory Visit (INDEPENDENT_AMBULATORY_CARE_PROVIDER_SITE_OTHER): Payer: Medicare Other

## 2016-04-26 ENCOUNTER — Telehealth: Payer: Self-pay | Admitting: Radiology

## 2016-04-26 ENCOUNTER — Telehealth: Payer: Self-pay | Admitting: Family Medicine

## 2016-04-26 DIAGNOSIS — K922 Gastrointestinal hemorrhage, unspecified: Secondary | ICD-10-CM

## 2016-04-26 DIAGNOSIS — D62 Acute posthemorrhagic anemia: Secondary | ICD-10-CM

## 2016-04-26 DIAGNOSIS — Z1211 Encounter for screening for malignant neoplasm of colon: Secondary | ICD-10-CM

## 2016-04-26 DIAGNOSIS — K921 Melena: Secondary | ICD-10-CM

## 2016-04-26 LAB — FECAL OCCULT BLOOD, IMMUNOCHEMICAL: FECAL OCCULT BLD: POSITIVE — AB

## 2016-04-26 NOTE — Telephone Encounter (Signed)
-----   Message from Inocencio Homes, [place] sent at 04/26/2016  2:26 PM EDT ----- SUE WAS NOTIFIED, THEY WOULD LIKE A REFERRAL TO GI WITHIN THE [place].

## 2016-04-26 NOTE — Telephone Encounter (Signed)
-----   Message from Inocencio Homes, [place] sent at 04/26/2016  2:26 PM EDT ----- SUE WAS NOTIFIED, THEY WOULD LIKE A REFERRAL TO GI WITHIN THE [place].

## 2016-04-26 NOTE — Addendum Note (Signed)
Addended by: Ellamae Sia on: 04/26/2016 09:07 AM   Modules accepted: Orders

## 2016-04-26 NOTE — Telephone Encounter (Signed)
[facility] called a POSITIVE ifob result, Results sent to Dr Diona Browner

## 2016-05-01 ENCOUNTER — Encounter: Payer: Self-pay | Admitting: Gastroenterology

## 2016-05-01 ENCOUNTER — Ambulatory Visit (INDEPENDENT_AMBULATORY_CARE_PROVIDER_SITE_OTHER): Payer: Medicare Other | Admitting: Gastroenterology

## 2016-05-01 VITALS — BP 153/55 | HR 73 | Temp 99.0°F | Ht 61.0 in | Wt 122.2 lb

## 2016-05-01 DIAGNOSIS — D509 Iron deficiency anemia, unspecified: Secondary | ICD-10-CM

## 2016-05-01 NOTE — Progress Notes (Signed)
Gastroenterology Consultation  Referring Provider:     Jinny Sanders, MD Primary Care Physician:  Eliezer Lofts, MD Primary Gastroenterologist:  Dr. Allen Norris     Reason for Consultation:     Heme positive stools        HPI:   Elizabeth Silva is a 82 y.o. y/o female referred for consultation & management of Heme positive stools by Dr. Eliezer Lofts, MD.  This patient comes in today with a history of anemia which is being treated with iron at the present time.  The patient denies any diarrhea but states she has black stools that started when she started the iron.  She now reports that she has been feeling well except has lost weight over the last year.  There is no report of any bright red blood per rectum or any site of  new or old blood in her stools.  There is no report of any abdominal pain or change in bowel habits. The patient denies any endoscopic procedures and many years.  She thinks it may have been 25 years ago.  She states that her blood count did drop significantly when she ran out of her iron pills but now states she has started the pills again.  Past Medical History  Diagnosis Date  . Allergic rhinitis   . COPD (chronic obstructive pulmonary disease) ([place])   . Diverticulosis of colon   . GERD (gastroesophageal reflux disease)   . Hyperlipidemia   . Hypertension     LE swelling with amlodipine, intolerant of ACEIs  . Osteoarthritis   . CKD (chronic kidney disease)   . Impaired vision     right eye, was told due to HTN  . LBBB (left bundle branch block)   . Diastolic CHF, acute (HCC)     echo (4/11) with EF 60-65%,mild LVH, mild aortic insufficiency, mild MR, PA systolic pressure 56-38 mmHg, mild to moderate TR  . History of PFTs   . H/O Clostridium difficile infection Sept. 2013  . GI bleed     Past Surgical History  Procedure Laterality Date  . Cardiac catheterization  1999    normal  . Total abdominal hysterectomy  1970    suspicious cells  . Cholecystectomy  1980  .  Cervical disc surgery  2005    cerv. disc, dz. C5-C7, facet arthritis    Prior to Admission medications   Medication Sig Start Date End Date Taking? Authorizing Provider  acetaminophen (TYLENOL) 500 MG tablet Take 500 mg by mouth every 6 (six) hours as needed.     Yes Historical Provider, MD  acidophilus (RISAQUAD) CAPS capsule Take 1 capsule by mouth daily.   Yes Historical Provider, MD  ADVAIR DISKUS 250-50 MCG/DOSE AEPB Inhale 1 puff into the  lungs two times daily 12/27/15  Yes Amy E Bedsole, MD  albuterol (PROAIR HFA) 108 (90 BASE) MCG/ACT inhaler Inhale 2 puffs into the lungs every 6 (six) hours as needed for wheezing or shortness of breath. 12/29/13  Yes Amy Cletis Athens, MD  amLODipine (NORVASC) 5 MG tablet Take 1 tablet by mouth two  times daily 11/08/15  Yes Amy E Bedsole, MD  aspirin 81 MG EC tablet Take 81 mg by mouth daily.     Yes Historical Provider, MD  BYSTOLIC 10 MG tablet Take 1 tablet by mouth  daily 11/08/15  Yes Amy E Bedsole, MD  ferrous fumarate (HEMOCYTE - 106 MG FE) 325 (106 FE) MG TABS tablet Take 1 tablet (  106 mg of iron total) by mouth daily. 10/27/13  Yes Amy E Diona Browner, MD  fluticasone (FLONASE) 50 MCG/ACT nasal spray Place 2 sprays into both nostrils daily. 03/20/16  Yes Amy Cletis [place], MD  furosemide (LASIX) 20 MG tablet Take 1 tablet (20 mg total) by mouth daily as needed. 10/27/13  Yes Amy Cletis [place], MD  gabapentin (NEURONTIN) 600 MG tablet Take 1 tablet by mouth at  bedtime 07/12/15  Yes Amy E Bedsole, MD  hydrALAZINE (APRESOLINE) 100 MG tablet Take 1 tablet (100 mg total) by mouth 3 (three) times daily. 01/30/16  Yes Minna Merritts, MD  lovastatin (MEVACOR) 20 MG tablet Take 1 tablet by mouth at  bedtime 03/30/16  Yes Minna Merritts, MD  oxybutynin (DITROPAN) 5 MG tablet Take 1 tablet by mouth two  times daily 10/18/15  Yes Amy E Bedsole, MD  pantoprazole (PROTONIX) 40 MG tablet Take 1 tablet (40 mg total) by mouth daily. 01/13/16  Yes Demetrios Loll, MD  SPIRIVA HANDIHALER 18  MCG inhalation capsule Inhale the contents of 1  capsule via HandiHaler  daily 12/05/15  Yes Amy E Bedsole, MD  traMADol (ULTRAM) 50 MG tablet Take 1 tablet (50 mg total) by mouth 2 (two) times daily as needed for moderate pain. 03/06/16 05/01/16 Yes Amy Cletis [place], MD  vitamin E 1000 UNIT capsule Take 1,000 Units by mouth daily.     Yes Historical Provider, MD    Family History  Problem Relation Age of Onset  . Heart block Mother     complete  . Dementia Father   . Colon cancer Sister   . Dementia Sister   . Lung cancer Brother   . Cancer Brother     sinus  . Diabetes Brother      Social History  Substance Use Topics  . Smoking status: Former Smoker -- 1.00 packs/day    Types: Cigarettes    Quit date: 10/15/1984  . Smokeless tobacco: Never Used  . Alcohol Use: 0.0 oz/week    0 Standard drinks or equivalent per week    Allergies as of 05/01/2016 - Review Complete 05/01/2016  Allergen Reaction Noted  . Levofloxacin    . Penicillins      Review of Systems:    All systems reviewed and negative except where noted in HPI.   Physical Exam:  BP 153/55 mmHg  Pulse 73  Temp(Src) 99 F (37.2 C) (Oral)  Ht '5\' 1"'$  (1.549 m)  Wt 122 lb 3.2 oz (55.43 kg)  BMI 23.10 kg/m2 No LMP recorded. Patient is postmenopausal. Psych:  Alert and cooperative. Normal mood and affect. General:   Alert,  Well-developed, well-nourished, pleasant and cooperative in NAD Head:  Normocephalic and atraumatic. Eyes:  Sclera clear, no icterus.   Conjunctiva pink. Ears:  Normal auditory acuity. Nose:  No deformity, discharge, or lesions. Mouth:  No deformity or lesions,oropharynx pink & moist. Neck:  Supple; no masses or thyromegaly. Lungs:  Respirations even and unlabored.  Clear throughout to auscultation.   No wheezes, crackles, or rhonchi. No acute distress. Heart:  Regular rate and rhythm; no murmurs, clicks, rubs, or gallops. Abdomen:  Normal bowel sounds.  No bruits.  Soft, non-tender and  non-distended without masses, hepatosplenomegaly or hernias noted.  No guarding or rebound tenderness.  Negative Carnett sign.   Rectal:  Deferred.  Msk:  Symmetrical without gross deformities.  Good, equal movement & strength bilaterally. Pulses:  Normal pulses noted. Extremities:  No clubbing or edema.  No  cyanosis. Neurologic:  Alert and oriented x3;  grossly normal neurologically. Skin:  Intact without significant lesions or rashes.  No jaundice. Lymph Nodes:  No significant cervical adenopathy. Psych:  Alert and cooperative. Normal mood and affect.  Imaging Studies: No results found.  Assessment and Plan:   TRISTIN GLADMAN is a 82 y.o. y/o female who comes in today with a history of anemia with findings consistent with iron deficiency anemia.  The patient has not had any endoscopic workup for this in the past.  Due to her advanced age she has been given the options of continuing the iron treatments or undergoing endoscopic evaluation.  She has been explained that endoscopic evaluation carries its risks including the risks of anesthesia, perforation and death. The patient states that if a mass or cancer was found she would not undergo any invasive surgery or chemotherapy.  She would like to continue the iron and not move forward with any invasive procedure at this time.  I agree with the patient and she will contact me if she has any signs of bleeding or change in her symptoms.   Note: This dictation was prepared with Dragon dictation along with smaller phrase technology. Any transcriptional errors that result from this process are unintentional.

## 2016-05-14 ENCOUNTER — Other Ambulatory Visit: Payer: Self-pay | Admitting: Family Medicine

## 2016-05-24 ENCOUNTER — Telehealth: Payer: Self-pay

## 2016-05-24 NOTE — Telephone Encounter (Signed)
PLEASE NOTE: All timestamps contained within this report are represented as [place] Standard Time. CONFIDENTIALTY NOTICE: This fax transmission is intended only for the addressee. It contains information that is legally privileged, confidential or otherwise protected from use or disclosure. If you are not the intended recipient, you are strictly prohibited from reviewing, disclosing, copying using or disseminating any of this information or taking any action in reliance on or regarding this information. If you have received this fax in error, please notify us immediately by telephone so that we can arrange for its return to [place]. Phone: [phone], Toll-Free: [phone], Fax: [phone] Page: 1 of 2 Call Id: [id number] [name] Patient Name: Elizabeth Silva Gender: Female DOB: 1918-06-14 Age: 82 Y 81 M 20 D Return Phone Number: [phone] (Primary) Address: City/State/Zip: [place] Client [place] [facility] - Client Client Site [place] Physician [facility] - PHYSICIAN, NOT LISTED- MD Contact Type Call Who Is Calling Patient / Member / Family / Caregiver Call Type Triage / Clinical Caller Name Cathren Laine Relationship To Patient [place] Return Phone Number [phone] (Primary) Chief Complaint Leg Injury Reason for Call Symptomatic / Request for [place] with a pt that injured her left leg on her bed and now has a hematoma that is 1-11/2" in diameter. PreDisposition Call Doctor Translation No Nurse Assessment Nurse: Amalia Hailey, RN, Melissa Date/Time (Eastern Time): 05/23/2016 4:47:36 PM Confirm and document reason for call. If symptomatic, describe symptoms. You must click the next button to save text entered. ---[place] with a pt that injured her left leg on her bed and now has a  hematoma that is 1-11/2" in diameter. Has the patient traveled out of the country within the last 30 days? ---Not Applicable Does the patient have any new or worsening symptoms? ---Yes Will a triage be completed? ---Yes Related visit to physician within the last 2 weeks? ---No Does the PT have any chronic conditions? (i.e. diabetes, asthma, etc.) ---Yes List chronic conditions. ---not sure what history is, should be on-file Is this a behavioral health or substance abuse call? ---No Guidelines Guideline Title Affirmed Question Affirmed Notes Nurse Date/Time Eilene Ghazi Time) Leg Injury ALSO, superficial cut (scratch) or abrasion (scrape) is present Amalia Hailey, RN, Melissa 05/23/2016 4:49:04 PM Disp. Time Eilene Ghazi Time) Disposition Final User 05/23/2016 4:53:31 PM Home Care Yes Amalia Hailey, RN, Melissa PLEASE NOTE: All timestamps contained within this report are represented as [place] Standard Time. CONFIDENTIALTY NOTICE: This fax transmission is intended only for the addressee. It contains information that is legally privileged, confidential or otherwise protected from use or disclosure. If you are not the intended recipient, you are strictly prohibited from reviewing, disclosing, copying using or disseminating any of this information or taking any action in reliance on or regarding this information. If you have received this fax in error, please notify us immediately by telephone so that we can arrange for its return to [place]. Phone: [phone], Toll-Free: [phone], Fax: [phone] Page: 2 of 2 Call Id: [id number] Caller Understands: Yes Disagree/Comply: Comply Care Advice Given Per Guideline HOME CARE: You should be able to treat this at home. REASSURANCE: It sounds like a small cut or scrape that we can treat at home. CUT OR SCRAPE: * Wash the wound with soap and water for 5 minutes. * For any dirt, scrub gently with a wash cloth. * Apply an antibiotic ointment (  OTC) three times a day for 3-4  days. * For large scrapes or cuts, cover with a Band-Aid or dressing. Change daily or if gets wet. PAIN MEDICINES: ACETAMINOPHEN (E.G., TYLENOL): IBUPROFEN (E.G., MOTRIN, ADVIL): CALL BACK IF: * You become worse. * Doesn't heal within 10 days * Looks infected (pus, redness) * Dirt in the wound persists after scrubbing Comments User: Colin Ina, RN Date/Time (Eastern Time): 05/23/2016 4:53:22 PM Ice pack for swelling for 20 minutes, given per nursing experience and knowledge. Caller verb. understood.

## 2016-05-25 ENCOUNTER — Other Ambulatory Visit: Payer: Self-pay | Admitting: Family Medicine

## 2016-05-29 ENCOUNTER — Encounter: Payer: Self-pay | Admitting: Family Medicine

## 2016-05-29 ENCOUNTER — Telehealth: Payer: Self-pay

## 2016-05-29 ENCOUNTER — Ambulatory Visit: Payer: Medicare Other | Admitting: Internal Medicine

## 2016-05-29 ENCOUNTER — Ambulatory Visit (INDEPENDENT_AMBULATORY_CARE_PROVIDER_SITE_OTHER): Payer: Medicare Other | Admitting: Family Medicine

## 2016-05-29 ENCOUNTER — Ambulatory Visit: Payer: Medicare Other | Admitting: Family Medicine

## 2016-05-29 VITALS — BP 132/48 | HR 73 | Temp 102.6°F

## 2016-05-29 DIAGNOSIS — R509 Fever, unspecified: Secondary | ICD-10-CM

## 2016-05-29 DIAGNOSIS — S8012XA Contusion of left lower leg, initial encounter: Secondary | ICD-10-CM

## 2016-05-29 LAB — BASIC METABOLIC PANEL
BUN: 67 mg/dL — AB (ref 6–23)
CHLORIDE: 100 meq/L (ref 96–112)
CO2: 23 meq/L (ref 19–32)
Calcium: 9.4 mg/dL (ref 8.4–10.5)
Creatinine, Ser: 2.3 mg/dL — ABNORMAL HIGH (ref 0.40–1.20)
GFR: 20.87 mL/min — ABNORMAL LOW (ref >60.00)
GLUCOSE: 108 mg/dL — AB (ref 70–99)
POTASSIUM: 4.6 meq/L (ref 3.5–5.1)
Sodium: 135 mEq/L (ref 135–145)

## 2016-05-29 LAB — POC URINALSYSI DIPSTICK (AUTOMATED)
BILIRUBIN UA: NEGATIVE
GLUCOSE UA: NEGATIVE
Ketones, UA: NEGATIVE
Nitrite, UA: NEGATIVE
Spec Grav, UA: 1.015
Urobilinogen, UA: 0.2
pH, UA: 6

## 2016-05-29 LAB — CBC WITH DIFFERENTIAL/PLATELET
BASOS PCT: 0.3 % (ref 0.0–3.0)
Basophils Absolute: 0 10*3/uL (ref 0.0–0.1)
EOS PCT: 0.1 % (ref 0.0–5.0)
Eosinophils Absolute: 0 10*3/uL (ref 0.0–0.7)
HCT: 32.3 % — ABNORMAL LOW (ref 36.0–46.0)
HEMOGLOBIN: 10.6 g/dL — AB (ref 12.0–15.0)
LYMPHS ABS: 0.8 10*3/uL (ref 0.7–4.0)
Lymphocytes Relative: 5.6 % — ABNORMAL LOW (ref 12.0–46.0)
MCHC: 32.8 g/dL (ref 30.0–36.0)
MCV: 87.2 fl (ref 78.0–100.0)
MONO ABS: 1.3 10*3/uL — AB (ref 0.1–1.0)
Monocytes Relative: 9.2 % (ref 3.0–12.0)
NEUTROS ABS: 11.9 10*3/uL — AB (ref 1.4–7.7)
Neutrophils Relative %: 84.8 % — ABNORMAL HIGH (ref 43.0–77.0)
Platelets: 223 10*3/uL (ref 150.0–400.0)
RBC: 3.71 Mil/uL — ABNORMAL LOW (ref 3.87–5.11)
RDW: 14.4 % (ref 11.5–15.5)
WBC: 14.1 10*3/uL — AB (ref 4.0–10.5)

## 2016-05-29 MED ORDER — SULFAMETHOXAZOLE-TRIMETHOPRIM 800-160 MG PO TABS: 0.5000 | ORAL_TABLET | Freq: Two times a day (BID) | ORAL | 0 refills | Status: DC

## 2016-05-29 NOTE — Patient Instructions (Signed)
Go to the lab on the way out.  We'll contact you with your lab report. Start septra today.  1/2 tab twice a day.  Skip the lasix and amlodipine until you are drinking fluids well and the fever is resolved.  Keep the bruising on your left shin covered with a nonstick bandage.   Update [place] tomorrow.  If worsening in the meantime, then go to the ER.  Take care.  Glad to see you.

## 2016-05-29 NOTE — Progress Notes (Signed)
Pre visit review using our clinic review tool, if applicable. No additional management support is needed unless otherwise documented below in the visit note. 

## 2016-05-29 NOTE — Telephone Encounter (Signed)
[facility] nurse calling pt had appt today at 11 AM with Dr Damita Dunnings but pts daughter called at 10:32 AM and cancelled appt because pt is not ready to come to office and she will cb to reschedule. Nurse said pt still has hematoma on shin and now has fever of 103.9. Nurse said pt does not have SOB,CP, confusion and does not appear toxic; pt just seems tired. Dr Diona Browner said if none of the symptoms just mentioned can be seen with 1st available appt today.rescheduled appt at 58 with Dr Damita Dunnings.

## 2016-05-29 NOTE — Progress Notes (Signed)
Hit L shin about 5 days ago.  Had been monitored at facility in the meantime.  Felt slightly harder yesterday but not ttp.  Not bleeding  Fever noted this AM, up to 103.   Dysuria: yes duration of symptoms: dysuria started yesterday.  abdominal pain:no back pain: at baseline, not different  vomiting:no Some cough, some sputum.  She has occ cough at baseline.    Meds, vitals, and allergies reviewed.   Per HPI unless specifically indicated in ROS section   GEN: nad, alert and oriented HEENT: mucous membranes moist NECK: supple CV: rrr.  PULM: ctab, no inc wob ABD: soft, +bs, suprapubic area not tender EXT: no edema SKIN: no acute rash BACK: no CVA pain L shin with 3x4cm nonbleeding hematoma that doesn't appear infected, redressed at OV.

## 2016-05-30 ENCOUNTER — Telehealth: Payer: Self-pay

## 2016-05-30 DIAGNOSIS — R509 Fever, unspecified: Secondary | ICD-10-CM | POA: Insufficient documentation

## 2016-05-30 DIAGNOSIS — S8010XA Contusion of unspecified lower leg, initial encounter: Secondary | ICD-10-CM | POA: Insufficient documentation

## 2016-05-30 LAB — URINE CULTURE: Colony Count: >=100000

## 2016-05-30 NOTE — Assessment & Plan Note (Signed)
Inc PO fluids and start the septra today.  ucx pending,  Hold amlodipine in the meantime.  Hold lasix for now.   See notes on labs.  At this point, still okay for outpatient fu.  Patient and daughter agree with plan.

## 2016-05-30 NOTE — Telephone Encounter (Signed)
Daughter calling to update you on pt, pt is feeling better no ferv, she's eating better. Requesting a call back regarding lab results. Thanks.

## 2016-05-30 NOTE — Assessment & Plan Note (Signed)
Should resolve but still likely take weeks to fully heal.  No need for I&D at this point.  Redressed at [place].

## 2016-06-01 ENCOUNTER — Other Ambulatory Visit (INDEPENDENT_AMBULATORY_CARE_PROVIDER_SITE_OTHER): Payer: Medicare Other

## 2016-06-01 DIAGNOSIS — R509 Fever, unspecified: Secondary | ICD-10-CM | POA: Diagnosis not present

## 2016-06-01 LAB — BASIC METABOLIC PANEL
BUN: 72 mg/dL — AB (ref 6–23)
CALCIUM: 8.9 mg/dL (ref 8.4–10.5)
CO2: 24 mEq/L (ref 19–32)
CREATININE: 2.67 mg/dL — AB (ref 0.40–1.20)
Chloride: 104 mEq/L (ref 96–112)
GFR: 17.57 mL/min — AB (ref >60.00)
GLUCOSE: 143 mg/dL — AB (ref 70–99)
Potassium: 4.7 mEq/L (ref 3.5–5.1)
Sodium: 135 mEq/L (ref 135–145)

## 2016-06-03 ENCOUNTER — Other Ambulatory Visit: Payer: Self-pay | Admitting: Family Medicine

## 2016-06-03 DIAGNOSIS — R7989 Other specified abnormal findings of blood chemistry: Secondary | ICD-10-CM

## 2016-06-04 ENCOUNTER — Other Ambulatory Visit (INDEPENDENT_AMBULATORY_CARE_PROVIDER_SITE_OTHER): Payer: Medicare Other

## 2016-06-04 DIAGNOSIS — R748 Abnormal levels of other serum enzymes: Secondary | ICD-10-CM | POA: Diagnosis not present

## 2016-06-04 DIAGNOSIS — R7989 Other specified abnormal findings of blood chemistry: Secondary | ICD-10-CM

## 2016-06-04 LAB — BASIC METABOLIC PANEL
BUN: 50 mg/dL — AB (ref 6–23)
CHLORIDE: 107 meq/L (ref 96–112)
CO2: 20 meq/L (ref 19–32)
CREATININE: 2.3 mg/dL — AB (ref 0.40–1.20)
Calcium: 8.7 mg/dL (ref 8.4–10.5)
GFR: 20.87 mL/min — ABNORMAL LOW (ref >60.00)
GLUCOSE: 105 mg/dL — AB (ref 70–99)
Potassium: 5 mEq/L (ref 3.5–5.1)
Sodium: 137 mEq/L (ref 135–145)

## 2016-06-05 ENCOUNTER — Telehealth: Payer: Self-pay | Admitting: Family Medicine

## 2016-06-05 NOTE — Telephone Encounter (Signed)
See note

## 2016-06-05 NOTE — Telephone Encounter (Signed)
Lab results given to Collie Siad.  Appointment scheduled 06/08/2016 at 9:15 am for follow up/labs with Dr. Diona Browner.

## 2016-06-05 NOTE — Telephone Encounter (Signed)
Patient's daughter,Sue,calling to get results of lab work.  Please call Collie Siad back.

## 2016-06-08 ENCOUNTER — Ambulatory Visit (INDEPENDENT_AMBULATORY_CARE_PROVIDER_SITE_OTHER): Payer: Medicare Other | Admitting: Family Medicine

## 2016-06-08 VITALS — BP 140/50 | HR 60 | Temp 97.6°F | Ht 61.0 in | Wt 125.2 lb

## 2016-06-08 DIAGNOSIS — N183 Chronic kidney disease, stage 3 unspecified: Secondary | ICD-10-CM

## 2016-06-08 DIAGNOSIS — Z23 Encounter for immunization: Secondary | ICD-10-CM

## 2016-06-08 DIAGNOSIS — L03116 Cellulitis of left lower limb: Secondary | ICD-10-CM | POA: Diagnosis not present

## 2016-06-08 DIAGNOSIS — N179 Acute kidney failure, unspecified: Secondary | ICD-10-CM | POA: Diagnosis not present

## 2016-06-08 LAB — BASIC METABOLIC PANEL
BUN: 40 mg/dL — ABNORMAL HIGH (ref 6–23)
CHLORIDE: 107 meq/L (ref 96–112)
CO2: 25 meq/L (ref 19–32)
CREATININE: 1.89 mg/dL — AB (ref 0.40–1.20)
Calcium: 8.8 mg/dL (ref 8.4–10.5)
GFR: 26.18 mL/min — ABNORMAL LOW (ref >60.00)
Glucose, Bld: 95 mg/dL (ref 70–99)
POTASSIUM: 4.9 meq/L (ref 3.5–5.1)
Sodium: 141 mEq/L (ref 135–145)

## 2016-06-08 MED ORDER — CEPHALEXIN 250 MG PO CAPS: 250.0000 mg | ORAL_CAPSULE | Freq: Three times a day (TID) | ORAL | 0 refills | Status: DC

## 2016-06-08 NOTE — Assessment & Plan Note (Signed)
Prerenal.  Was improving on 8/21.Marland Kitchen will re-eval.  Will liekly need to return to nephrology.

## 2016-06-08 NOTE — Assessment & Plan Note (Signed)
Treat with keflex renal adjusted. Follow up in 4 days. Likely source of fever instead of UTI as culture neg.

## 2016-06-08 NOTE — Progress Notes (Signed)
Pre visit review using our clinic review tool, if applicable. No additional management support is needed unless otherwise documented below in the visit note. 

## 2016-06-08 NOTE — Patient Instructions (Addendum)
Start keflex for left leg cellulitis 250 mg three times daily x 7 days. Stop at lab on way out for lab check.  Drink adequate fluids.  Keep area covered with gauze. Elevated legs above heart as much as able. Do no wear compression hose on infected leg.  Go to ER if redness spreading beyond border.  Hold lasix.

## 2016-06-08 NOTE — Progress Notes (Signed)
   Subjective:    Patient ID: Elizabeth Silva, female    DOB: February 06, 1918, 82 y.o.   MRN: [id number]  HPI  82 year old female with hx of frequent UTI presented to Dr. Keturah Barre on 8/15 with high fever and a leg hematoma on left anterior lower leg.   She was dehydrated.. Told to increase po intake. Hold lasix ( was only using prn anyway).  Started on  Septra for UTI. Did not have UTI symptoms.  Uculture : mulit colonies, ? Contaminated.  Hx of CKD stage 3  Baseline cr 1.59/GFR 30  On 8/15 prerenal insufficiency.. Cr 2.37  8/18 Cr 2.67.Marland Kitchen Told to increase fluids. Fever resolved 8/21 Cr  2.30  Today she states she does not feel well.  She is having pain and redness in left lower leg near hematoma. She has been elevating legs but still swollen.  She did take a lasix on 8/21.   Has seen nephrology in past Dr. Candiss Norse.     Review of Systems  Constitutional: Positive for fatigue. Negative for fever.  HENT: Negative for ear pain.   Eyes: Negative for pain.  Respiratory: Negative for shortness of breath.   Cardiovascular: Negative for chest pain.  Gastrointestinal: Negative for abdominal pain.       Objective:   Physical Exam  Constitutional: Vital signs are normal. She appears well-developed and well-nourished. She is cooperative.  Non-toxic appearance. She does not appear ill. No distress.  HENT:  Head: Normocephalic.  Right Ear: Hearing, tympanic membrane, external ear and ear canal normal. Tympanic membrane is not erythematous, not retracted and not bulging.  Left Ear: Hearing, tympanic membrane, external ear and ear canal normal. Tympanic membrane is not erythematous, not retracted and not bulging.  Nose: No mucosal edema or rhinorrhea. Right sinus exhibits no maxillary sinus tenderness and no frontal sinus tenderness. Left sinus exhibits no maxillary sinus tenderness and no frontal sinus tenderness.  Mouth/Throat: Uvula is midline, oropharynx is clear and moist and mucous membranes are  normal.  Eyes: Conjunctivae, EOM and lids are normal. Pupils are equal, round, and reactive to light. Lids are everted and swept, no foreign bodies found.  Neck: Trachea normal and normal range of motion. Neck supple. Carotid bruit is not present. No thyroid mass and no thyromegaly present.  Cardiovascular: Normal rate, regular rhythm, S1 normal, S2 normal, normal heart sounds, intact distal pulses and normal pulses.  Exam reveals no gallop and no friction rub.   No murmur heard. Pulmonary/Chest: Effort normal and breath sounds normal. No tachypnea. No respiratory distress. She has no decreased breath sounds. She has no wheezes. She has no rhonchi. She has no rales.  Abdominal: Soft. Normal appearance and bowel sounds are normal. There is no tenderness.  Neurological: She is alert.  Skin: Skin is warm, dry and intact. No rash noted.     Left anterior lower leg 6 cm diameter circular hematoma, firm with surrounding erthema, warmth and swelling.   Right lower leg is swollen 1+ but not erythematous  Psychiatric: Her speech is normal and behavior is normal. Judgment and thought content normal. Her mood appears not anxious. Cognition and memory are normal. She does not exhibit a depressed mood.          Assessment & Plan:

## 2016-06-11 ENCOUNTER — Other Ambulatory Visit: Payer: Self-pay | Admitting: Family Medicine

## 2016-06-12 ENCOUNTER — Ambulatory Visit (INDEPENDENT_AMBULATORY_CARE_PROVIDER_SITE_OTHER): Payer: Medicare Other | Admitting: Family Medicine

## 2016-06-12 ENCOUNTER — Encounter: Payer: Self-pay | Admitting: Family Medicine

## 2016-06-12 DIAGNOSIS — S8012XA Contusion of left lower leg, initial encounter: Secondary | ICD-10-CM

## 2016-06-12 DIAGNOSIS — R918 Other nonspecific abnormal finding of lung field: Secondary | ICD-10-CM | POA: Diagnosis not present

## 2016-06-12 DIAGNOSIS — N184 Chronic kidney disease, stage 4 (severe): Secondary | ICD-10-CM

## 2016-06-12 DIAGNOSIS — L03116 Cellulitis of left lower limb: Secondary | ICD-10-CM | POA: Diagnosis not present

## 2016-06-12 NOTE — Assessment & Plan Note (Signed)
Concerning for adenocarcinoma. Pt and family to not wish to eval further but these are concerning for lung cancer.

## 2016-06-12 NOTE — Assessment & Plan Note (Signed)
Improving on antibiotics. Complete course.

## 2016-06-12 NOTE — Assessment & Plan Note (Signed)
Stable. Slowly resolving.

## 2016-06-12 NOTE — Progress Notes (Signed)
Pre visit review using our clinic review tool, if applicable. No additional management support is needed unless otherwise documented below in the visit note. 

## 2016-06-12 NOTE — Progress Notes (Signed)
   Subjective:    Patient ID: Elizabeth Silva, female    DOB: 07/13/1918, 82 y.o.   MRN: [id number]  HPI   82 year old female presents for 4 day follow up of left leg cellulitis. ON day 4/7 of  Renal dose Keflex TID.  Infection entered at site of left leg hematoma.  A on CRF: at last check Cr 2.67 on 8/18 down to 2.30 on 8/21  And finally 1.89 on 8/25.  Has continued improving fluids.      Review of Systems  Constitutional: Negative for fatigue and fever.  HENT: Negative for ear pain.   Eyes: Negative for pain.  Respiratory: Negative for chest tightness and shortness of breath.   Cardiovascular: Negative for chest pain, palpitations and leg swelling.  Gastrointestinal: Negative for abdominal pain.  Genitourinary: Negative for dysuria.       Objective:   Physical Exam  Constitutional: Vital signs are normal. She appears well-developed and well-nourished. She is cooperative.  Non-toxic appearance. She does not appear ill. No distress.  HENT:  Head: Normocephalic.  Right Ear: Hearing, tympanic membrane, external ear and ear canal normal. Tympanic membrane is not erythematous, not retracted and not bulging.  Left Ear: Hearing, tympanic membrane, external ear and ear canal normal. Tympanic membrane is not erythematous, not retracted and not bulging.  Nose: No mucosal edema or rhinorrhea. Right sinus exhibits no maxillary sinus tenderness and no frontal sinus tenderness. Left sinus exhibits no maxillary sinus tenderness and no frontal sinus tenderness.  Mouth/Throat: Uvula is midline, oropharynx is clear and moist and mucous membranes are normal.  Eyes: Conjunctivae, EOM and lids are normal. Pupils are equal, round, and reactive to light. Lids are everted and swept, no foreign bodies found.  Neck: Trachea normal and normal range of motion. Neck supple. Carotid bruit is not present. No thyroid mass and no thyromegaly present.  Cardiovascular: Normal rate, regular rhythm, S1 normal, S2  normal, normal heart sounds, intact distal pulses and normal pulses.  Exam reveals no gallop and no friction rub.   No murmur heard. Pulmonary/Chest: Effort normal and breath sounds normal. No tachypnea. No respiratory distress. She has no decreased breath sounds. She has no wheezes. She has no rhonchi. She has no rales.  Abdominal: Soft. Normal appearance and bowel sounds are normal. There is no tenderness.  Neurological: She is alert.  Skin: Skin is warm, dry and intact. No rash noted.     Less redness, less warmth in left anterior calf.   Right lower leg is swollen 1+ but not erythematous  Psychiatric: Her speech is normal and behavior is normal. Judgment and thought content normal. Her mood appears not anxious. Cognition and memory are normal. She does not exhibit a depressed mood.          Assessment & Plan:

## 2016-06-12 NOTE — Assessment & Plan Note (Signed)
Has nephrologist Dr. Candiss Norse.. If progresses pt wishes to consider dialysis. Given this issue and tenuous kidney function although now returned to baseline.. Refer back to nephrology for further recommendations.

## 2016-06-12 NOTE — Patient Instructions (Signed)
Complete the antibiotics.  Drink lots of water.  Call Dr. Keturah Barre office for routine follow up.  Call if redness I spreading on left leg or fever returns. Call if you have diarrhea or abdominal pain.

## 2016-06-26 ENCOUNTER — Other Ambulatory Visit: Payer: Self-pay | Admitting: Family Medicine

## 2016-06-26 NOTE — Telephone Encounter (Signed)
Last office visit 06/12/2016. Last refilled 07/12/2015 for #90 with 3 refills.  Ok to refill?

## 2016-06-29 DIAGNOSIS — R600 Localized edema: Secondary | ICD-10-CM | POA: Diagnosis not present

## 2016-06-29 DIAGNOSIS — R601 Generalized edema: Secondary | ICD-10-CM | POA: Diagnosis not present

## 2016-06-29 DIAGNOSIS — I10 Essential (primary) hypertension: Secondary | ICD-10-CM | POA: Diagnosis not present

## 2016-06-29 DIAGNOSIS — N184 Chronic kidney disease, stage 4 (severe): Secondary | ICD-10-CM | POA: Diagnosis not present

## 2016-07-10 ENCOUNTER — Telehealth: Payer: Self-pay

## 2016-07-10 MED ORDER — OMEPRAZOLE 40 MG PO CPDR: 40.0000 mg | DELAYED_RELEASE_CAPSULE | Freq: Every day | ORAL | 1 refills | Status: DC

## 2016-07-10 NOTE — Telephone Encounter (Signed)
Pt left v/m requesting refill omeprazole to [facility]. Omeprazole is on hx med list last refilled # 90 x 1 on 04/18/15. On current med list pantoprazole listed and last refilled # 60 on 01/13/16 by Dr Demetrios Loll.Please advise.

## 2016-07-10 NOTE — Telephone Encounter (Signed)
Left message to Elizabeth Silva to return my call.

## 2016-07-10 NOTE — Telephone Encounter (Signed)
Call pt . Does one work better for her? Make sure not taking both. Refill preferred one of them for 6 months.

## 2016-07-10 NOTE — Telephone Encounter (Signed)
Patient returned Donna's call.  Patient said she's taking the Omeprazole 40 mg.

## 2016-07-10 NOTE — Telephone Encounter (Signed)
Refill on Omeprazole 40 mg sent into [facility].

## 2016-07-17 ENCOUNTER — Encounter: Payer: Self-pay | Admitting: Family Medicine

## 2016-07-17 ENCOUNTER — Ambulatory Visit (INDEPENDENT_AMBULATORY_CARE_PROVIDER_SITE_OTHER): Payer: Medicare Other | Admitting: Family Medicine

## 2016-07-17 DIAGNOSIS — N184 Chronic kidney disease, stage 4 (severe): Secondary | ICD-10-CM | POA: Diagnosis not present

## 2016-07-17 DIAGNOSIS — K921 Melena: Secondary | ICD-10-CM | POA: Diagnosis not present

## 2016-07-17 DIAGNOSIS — I10 Essential (primary) hypertension: Secondary | ICD-10-CM

## 2016-07-17 DIAGNOSIS — M153 Secondary multiple arthritis: Secondary | ICD-10-CM

## 2016-07-17 DIAGNOSIS — N179 Acute kidney failure, unspecified: Secondary | ICD-10-CM | POA: Diagnosis not present

## 2016-07-17 MED ORDER — TRAMADOL HCL 50 MG PO TABS: 50.0000 mg | ORAL_TABLET | Freq: Three times a day (TID) | ORAL | 0 refills | Status: DC | PRN

## 2016-07-17 NOTE — Assessment & Plan Note (Signed)
Refilled tramadol to use prn pain.

## 2016-07-17 NOTE — Patient Instructions (Signed)
Keep working on healthy eating and chair exercises.  look for coupons for Spiriva.  Discuss Bystolic with Dr. Rockey Situ to see if there is a cheaper option.

## 2016-07-17 NOTE — Assessment & Plan Note (Signed)
Reviewed Dr. Ledell Noss last [place] note.

## 2016-07-17 NOTE — Assessment & Plan Note (Signed)
Resolved at last check. Followed by Dr. Candiss Norse.

## 2016-07-17 NOTE — Assessment & Plan Note (Signed)
Well controlled. Continue current medication.  

## 2016-07-17 NOTE — Progress Notes (Signed)
Subjective:    Patient ID: Elizabeth Silva, female    DOB: [birth year], 82 y.o.   MRN: [id number]  HPI   82 year old female presents for 3 month follow up multiple issues, but she was seen as recently as 8/25 and 8/29 for cellulitis left lower leg. S/p course keflex.   Left lower leg scab healing well.  She has been feeling well overall. Fairly good energy. Good days and bad days.  She is using Culturelle probiotic BID.  She is doing chair aerobics week day.  ARF on CKD stage 4:  8/21: Cr 2.3, 8/25 after increased fluids Cr 1.89 Followed by Dr. Candiss Norse, palns follow up in 6 months.   Anemia acute on chronic due to melena,Upper GI bleed: Pt saw GI  04/2016 for consideration of endoscopy to eval. Chose against this given age and risk. Will continue to follow abd iron 325 mg for now. Hg stable at 10.6 05/29/2016. Will check q 3 months.  Hypertension:   Stable control on current regimen   BP Readings from Last 3 Encounters:  07/17/16 139/62  06/12/16 (!) 146/59  06/08/16 (!) 140/50  Using medication without problems or lightheadedness:  Chest pain with exertion: none Edema: stable right lower leg sweilling Short of breath:stable Average home BPs: Other issues:   Using tramadol for pain in knees twice daily.. This helps a lot.  Social History /Family History/Past Medical History reviewed and updated if needed.   Review of Systems  Constitutional: Positive for fatigue. Negative for fever.  HENT: Negative for ear pain.   Eyes: Negative for pain.  Respiratory: Negative for chest tightness and shortness of breath.   Cardiovascular: Negative for chest pain, palpitations and leg swelling.  Gastrointestinal: Negative for abdominal pain.  Genitourinary: Negative for dysuria.       Objective:   Physical Exam  Constitutional: Vital signs are normal. She appears well-developed and well-nourished. She is cooperative.  Non-toxic appearance. She does not appear ill. No distress.  HENT:    Head: Normocephalic.  Right Ear: Hearing, tympanic membrane, external ear and ear canal normal. Tympanic membrane is not erythematous, not retracted and not bulging.  Left Ear: Hearing, tympanic membrane, external ear and ear canal normal. Tympanic membrane is not erythematous, not retracted and not bulging.  Nose: No mucosal edema or rhinorrhea. Right sinus exhibits no maxillary sinus tenderness and no frontal sinus tenderness. Left sinus exhibits no maxillary sinus tenderness and no frontal sinus tenderness.  Mouth/Throat: Uvula is midline, oropharynx is clear and moist and mucous membranes are normal.  Eyes: Conjunctivae, EOM and lids are normal. Pupils are equal, round, and reactive to light. Lids are everted and swept, no foreign bodies found.  Neck: Trachea normal and normal range of motion. Neck supple. Carotid bruit is not present. No thyroid mass and no thyromegaly present.  Cardiovascular: Normal rate, regular rhythm, S1 normal, S2 normal, normal heart sounds, intact distal pulses and normal pulses.  Exam reveals no gallop and no friction rub.   No murmur heard. Pulmonary/Chest: Effort normal and breath sounds normal. No tachypnea. No respiratory distress. She has no decreased breath sounds. She has no wheezes. She has no rhonchi. She has no rales.  Abdominal: Soft. Normal appearance and bowel sounds are normal. There is no tenderness.  Neurological: She is alert.  Skin: Skin is warm, dry and intact. No rash noted.     Healing scab without erythema and warmth on left anterior leg   Right lower leg is  swollen 1+ but not erythematous  Psychiatric: Her speech is normal and behavior is normal. Judgment and thought content normal. Her mood appears not anxious. Cognition and memory are normal. She does not exhibit a depressed mood.          Assessment & Plan:

## 2016-07-17 NOTE — Assessment & Plan Note (Addendum)
Pt saw GI 04/2016 for consideration of endoscopy to eval after episode of melena, upper GI bleed and hg drop. Chose against this given age and risk. Will continue to follow Hg q 3 months and continue iron 325 mg for now. Transfuse if <7

## 2016-07-17 NOTE — Progress Notes (Signed)
Pre visit review using our clinic review tool, if applicable. No additional management support is needed unless otherwise documented below in the visit note. 

## 2016-07-26 DIAGNOSIS — L821 Other seborrheic keratosis: Secondary | ICD-10-CM | POA: Diagnosis not present

## 2016-07-26 DIAGNOSIS — L578 Other skin changes due to chronic exposure to nonionizing radiation: Secondary | ICD-10-CM | POA: Diagnosis not present

## 2016-07-26 DIAGNOSIS — L700 Acne vulgaris: Secondary | ICD-10-CM | POA: Diagnosis not present

## 2016-07-26 DIAGNOSIS — L720 Epidermal cyst: Secondary | ICD-10-CM | POA: Diagnosis not present

## 2016-07-26 DIAGNOSIS — L820 Inflamed seborrheic keratosis: Secondary | ICD-10-CM | POA: Diagnosis not present

## 2016-08-01 ENCOUNTER — Ambulatory Visit (INDEPENDENT_AMBULATORY_CARE_PROVIDER_SITE_OTHER)
Admission: RE | Admit: 2016-08-01 | Discharge: 2016-08-01 | Disposition: A | Discharge status: Home / Self Care | Payer: Medicare Other | Source: Ambulatory Visit | Attending: Family Medicine | Admitting: Family Medicine

## 2016-08-01 ENCOUNTER — Encounter: Payer: Self-pay | Admitting: Family Medicine

## 2016-08-01 ENCOUNTER — Ambulatory Visit (INDEPENDENT_AMBULATORY_CARE_PROVIDER_SITE_OTHER): Payer: Medicare Other | Admitting: Family Medicine

## 2016-08-01 VITALS — BP 130/60 | HR 74 | Temp 98.4°F | Wt 122.8 lb

## 2016-08-01 DIAGNOSIS — R630 Anorexia: Secondary | ICD-10-CM | POA: Diagnosis not present

## 2016-08-01 DIAGNOSIS — J351 Hypertrophy of tonsils: Secondary | ICD-10-CM | POA: Diagnosis not present

## 2016-08-01 DIAGNOSIS — J069 Acute upper respiratory infection, unspecified: Secondary | ICD-10-CM | POA: Diagnosis not present

## 2016-08-01 DIAGNOSIS — R531 Weakness: Secondary | ICD-10-CM

## 2016-08-01 DIAGNOSIS — R0602 Shortness of breath: Secondary | ICD-10-CM

## 2016-08-01 LAB — CBC WITH DIFFERENTIAL/PLATELET
BASOS PCT: 0.9 % (ref 0.0–3.0)
Basophils Absolute: 0.1 10*3/uL (ref 0.0–0.1)
EOS PCT: 0.4 % (ref 0.0–5.0)
Eosinophils Absolute: 0 10*3/uL (ref 0.0–0.7)
HEMATOCRIT: 30.9 % — AB (ref 36.0–46.0)
Hemoglobin: 10.2 g/dL — ABNORMAL LOW (ref 12.0–15.0)
LYMPHS ABS: 1.5 10*3/uL (ref 0.7–4.0)
LYMPHS PCT: 11.9 % — AB (ref 12.0–46.0)
MCHC: 33.1 g/dL (ref 30.0–36.0)
MCV: 86.4 fl (ref 78.0–100.0)
MONOS PCT: 7.8 % (ref 3.0–12.0)
Monocytes Absolute: 1 10*3/uL (ref 0.1–1.0)
NEUTROS ABS: 9.7 10*3/uL — AB (ref 1.4–7.7)
NEUTROS PCT: 79 % — AB (ref 43.0–77.0)
PLATELETS: 315 10*3/uL (ref 150.0–400.0)
RBC: 3.57 Mil/uL — ABNORMAL LOW (ref 3.87–5.11)
RDW: 16 % — ABNORMAL HIGH (ref 11.5–15.5)
WBC: 12.3 10*3/uL — ABNORMAL HIGH (ref 4.0–10.5)

## 2016-08-01 LAB — COMPREHENSIVE METABOLIC PANEL
ALK PHOS: 64 U/L (ref 39–117)
ALT: 9 U/L (ref 0–35)
AST: 16 U/L (ref 0–37)
Albumin: 4 g/dL (ref 3.5–5.2)
BILIRUBIN TOTAL: 0.3 mg/dL (ref 0.2–1.2)
BUN: 35 mg/dL — ABNORMAL HIGH (ref 6–23)
CALCIUM: 9.4 mg/dL (ref 8.4–10.5)
CO2: 24 meq/L (ref 19–32)
Chloride: 104 mEq/L (ref 96–112)
Creatinine, Ser: 1.58 mg/dL — ABNORMAL HIGH (ref 0.40–1.20)
GFR: 32.18 mL/min — AB (ref >60.00)
GLUCOSE: 137 mg/dL — AB (ref 70–99)
POTASSIUM: 4.3 meq/L (ref 3.5–5.1)
Sodium: 138 mEq/L (ref 135–145)
Total Protein: 7.6 g/dL (ref 6.0–8.3)

## 2016-08-01 MED ORDER — ALBUTEROL SULFATE HFA 108 (90 BASE) MCG/ACT IN AERS: 2.0000 | INHALATION_SPRAY | RESPIRATORY_TRACT | 1 refills | Status: DC | PRN

## 2016-08-01 MED ORDER — DOXYCYCLINE HYCLATE 100 MG PO CAPS: 100.0000 mg | ORAL_CAPSULE | Freq: Two times a day (BID) | ORAL | 0 refills | Status: DC

## 2016-08-01 NOTE — Patient Instructions (Signed)
Please use your albuterol inhaler every 2 to 4 hours while awake for the next two days and then every 4 to 6 hours as needed  Eat small meals every 2-3 hours and drink shakes if you like themj  We will call you tomorrow about your test results  If you are feeling worse please go to the emergency room

## 2016-08-01 NOTE — Progress Notes (Signed)
Subjective:    Patient ID: Elizabeth Silva, female    DOB: 02-Dec-1917, 82 y.o.   MRN: [id number]  HPI This is a 82 yo female, accompanied by her daughter, who presents today stating she is "just feeling bad." Started with a runny nose and cough for 6 days. Has not had any appetite for several days. Feels a little sick to her stomach. Cold symptoms resolving but still has a lot of drainage that is thick with white phlegm. Not sleeping well, coughing and having to urinate frequently through the night- cough wakes her, then she gets up to go to the bathroom since she is already awak. No fever or chills. No diarrhea. Was constipated this morning but had a bm today. Has been taking mucinex and chloracedin.  No recent falls. Some headache. No dizziness or lightheadedness. No dysuria. Feels like breathing is a little more short from baseline. Has albuterol inhaler, but has not been using.   Past Medical History:  Diagnosis Date  . Allergic rhinitis   . CKD (chronic kidney disease)   . COPD (chronic obstructive pulmonary disease) ([place])   . Diastolic CHF, acute ([facility])    echo (4/11) with EF 60-65%,mild LVH, mild aortic insufficiency, mild MR, PA systolic pressure 41-32 mmHg, mild to moderate TR  . Diverticulosis of colon   . GERD (gastroesophageal reflux disease)   . GI bleed   . H/O Clostridium difficile infection Sept. 2013  . History of PFTs   . Hyperlipidemia   . Hypertension    LE swelling with amlodipine, intolerant of ACEIs  . Impaired vision    right eye, was told due to HTN  . LBBB (left bundle branch block)   . Osteoarthritis    Past Surgical History:  Procedure Laterality Date  . CARDIAC CATHETERIZATION  1999   normal  . CERVICAL [place] SURGERY  2005   cerv. disc, dz. C5-C7, facet arthritis  . CHOLECYSTECTOMY  1980  . TOTAL ABDOMINAL HYSTERECTOMY  1970   suspicious cells   Family History  Problem Relation Age of Onset  . Heart block Mother     complete  . Dementia Father     . Colon cancer Sister   . Dementia Sister   . Lung cancer Brother   . Cancer Brother     sinus  . Diabetes Brother    Social History  Substance Use Topics  . Smoking status: Former Smoker    Packs/day: 1.00    Types: Cigarettes    Quit date: 10/15/1984  . Smokeless tobacco: Never Used  . Alcohol use 0.0 oz/week      Review of Systems Per HPI    Objective:   Physical Exam  Constitutional: She is oriented to person, place, and time. She appears well-developed and well-nourished. No distress.  HENT:  Head: Normocephalic and atraumatic.  Nose: Mucosal edema and rhinorrhea present.  Mouth/Throat: Uvula is midline and mucous membranes are normal. No oropharyngeal exudate or posterior oropharyngeal erythema.  +2 tonsils  Cardiovascular: Normal rate, regular rhythm and normal heart sounds.   Pulmonary/Chest: Effort normal. She has no wheezes.  Scattered posterior rhonchi.   Musculoskeletal: She exhibits no edema.  Neurological: She is alert and oriented to person, place, and time.  Skin: Skin is warm and dry. She is not diaphoretic.  Psychiatric: She has a normal mood and affect. Her behavior is normal. Judgment and thought content normal.  Vitals reviewed.     BP 130/60   Pulse 74  Temp 98.4 F (36.9 C)   Wt 122 lb 12.8 oz (55.7 kg)   SpO2 96%   BMI 23.20 kg/m  Wt Readings from Last 3 Encounters:  08/01/16 122 lb 12.8 oz (55.7 kg)  07/17/16 125 lb 8 oz (56.9 kg)  06/12/16 124 lb 12 oz (56.6 kg)  Dg Chest 2 View  Result Date: 08/01/2016 CLINICAL DATA:  Shortness of breath. Abnormal lung sounds. Lung nodules. EXAM: CHEST  2 VIEW COMPARISON:  One-view chest x-ray 01/12/2016. CT of the chest 11/07/2015. FINDINGS: The heart is enlarged. Emphysematous changes are again noted. Nodular densities are again seen in the left lung. There is collapse or consolidation involving the right middle lobe. Nodular right lower lobe airspace disease is again seen with some progression.  There is no definite interval change. There is no edema or effusion to suggest failure. Atherosclerotic changes are again noted within the aorta. Levoconvex curvature is present in the lower thoracic spine. IMPRESSION: 1. Similar appearance of bilateral nodular disease. While this may be postinfectious, neoplasm is not excluded. Recommend CT of the chest with contrast further evaluation of any progression. 2. Progressive interstitial and airspace disease at the right lung base. This could also be evaluated with CT. 3. Atherosclerosis of the aorta. 4. Emphysema. Electronically Signed   By: San Morelle M.D.   On: 08/01/2016 14:26   POCT rapid strep- negative     Assessment & Plan:  Discussed with Dr. Damita Dunnings who agreed with plan of care 1. Shortness of breath - CBC with Differential/Platelet - Comprehensive metabolic panel - DG Chest 2 View; Future - albuterol (PROAIR HFA) 108 (90 Base) MCG/ACT inhaler; Inhale 2 puffs into the lungs every 4 (four) hours as needed for wheezing or shortness of breath.  Dispense: 3 Inhaler; Refill: 1  2. Enlarged tonsils - POCT rapid strep A - CBC with Differential/Platelet - Comprehensive metabolic panel - Culture, Group A Strep  3. Anorexia - encouraged her to eat small, frequent meals, can drink nutrition shakes if she likes them - CBC with Differential/Platelet - Comprehensive metabolic panel  4. Weakness - CBC with Differential/Platelet - Comprehensive metabolic panel - DG Chest 2 View; Future - Culture, Group A Strep  5. Acute upper respiratory infection - patient with known COPD, will cover for bacterial infection and encouraged increased inhaler use - doxycycline (VIBRAMYCIN) 100 MG capsule; Take 1 capsule (100 mg total) by mouth 2 (two) times daily.  Dispense: 14 capsule; Refill: 0 - albuterol (PROAIR HFA) 108 (90 Base) MCG/ACT inhaler; Inhale 2 puffs into the lungs every 4 (four) hours as needed for wheezing or shortness of breath.   Dispense: 3 Inhaler; Refill: 1 - Culture, Group A Strep  - RTC precautions reviewed  Clarene Reamer, FNP-BC  [facility]  08/02/2016 8:37 AM

## 2016-08-02 LAB — POCT RAPID STREP A (OFFICE): Rapid Strep A Screen: NEGATIVE

## 2016-08-03 LAB — CULTURE, GROUP A STREP: ORGANISM ID, BACTERIA: NORMAL

## 2016-08-09 ENCOUNTER — Other Ambulatory Visit: Payer: Self-pay | Admitting: Family Medicine

## 2016-08-10 DIAGNOSIS — H353124 Nonexudative age-related macular degeneration, left eye, advanced atrophic with subfoveal involvement: Secondary | ICD-10-CM | POA: Diagnosis not present

## 2016-08-12 ENCOUNTER — Other Ambulatory Visit: Payer: Self-pay | Admitting: Family Medicine

## 2016-08-27 ENCOUNTER — Telehealth: Payer: Self-pay

## 2016-08-27 NOTE — Telephone Encounter (Signed)
Pt request a refill gabapentin to [facility]. Advised pt [facility]/2016. Pt will contact [facility] for refill.

## 2016-09-12 DIAGNOSIS — L570 Actinic keratosis: Secondary | ICD-10-CM | POA: Diagnosis not present

## 2016-09-12 DIAGNOSIS — D692 Other nonthrombocytopenic purpura: Secondary | ICD-10-CM | POA: Diagnosis not present

## 2016-09-12 DIAGNOSIS — L820 Inflamed seborrheic keratosis: Secondary | ICD-10-CM | POA: Diagnosis not present

## 2016-09-12 DIAGNOSIS — L578 Other skin changes due to chronic exposure to nonionizing radiation: Secondary | ICD-10-CM | POA: Diagnosis not present

## 2016-09-17 ENCOUNTER — Other Ambulatory Visit: Payer: Self-pay | Admitting: Family Medicine

## 2016-09-17 ENCOUNTER — Other Ambulatory Visit: Payer: Self-pay | Admitting: *Deleted

## 2016-09-17 NOTE — Telephone Encounter (Signed)
Last office visit 08/01/2016 with D. Carlean Purl.  Last refilled 07/17/2016 for #60 with no refills.  Please print Rx to fax into [facility].

## 2016-09-18 MED ORDER — TRAMADOL HCL 50 MG PO TABS: 50.0000 mg | ORAL_TABLET | Freq: Three times a day (TID) | ORAL | 0 refills | Status: DC | PRN

## 2016-09-18 NOTE — Telephone Encounter (Signed)
Tramadol Rx faxed to [facility] [phone]. 

## 2016-10-03 ENCOUNTER — Telehealth: Payer: Self-pay | Admitting: Family Medicine

## 2016-10-03 NOTE — Telephone Encounter (Signed)
Patient is coming in for a follow up appointment on 10/19/16.  Patient's daughter,Sue, wanted to know if she'll need labs done for the appointment.  If so, she'd like the orders put in the computer, so she can have the lab work done at the [facility].  Please call Collie Siad to let her know if patient needs lab work done.

## 2016-10-05 NOTE — Telephone Encounter (Signed)
Lm on pts vm and advised no blood work required before her upcoming appt

## 2016-10-05 NOTE — Telephone Encounter (Signed)
No lab work needed prior to appt.

## 2016-10-17 ENCOUNTER — Other Ambulatory Visit: Payer: Self-pay | Admitting: Family Medicine

## 2016-10-17 NOTE — Telephone Encounter (Signed)
Last office visit 08/01/2016 with Glenda Chroman, NP.  Neither medication is on patient's current medication list.  Refill?

## 2016-10-19 ENCOUNTER — Ambulatory Visit: Payer: Medicare Other | Admitting: Family Medicine

## 2016-10-25 ENCOUNTER — Encounter: Payer: Self-pay | Admitting: Family Medicine

## 2016-10-25 ENCOUNTER — Ambulatory Visit (INDEPENDENT_AMBULATORY_CARE_PROVIDER_SITE_OTHER)
Admission: RE | Admit: 2016-10-25 | Discharge: 2016-10-25 | Disposition: A | Discharge status: Home / Self Care | Payer: Medicare Other | Source: Ambulatory Visit | Attending: Family Medicine | Admitting: Family Medicine

## 2016-10-25 ENCOUNTER — Encounter: Payer: Self-pay | Admitting: *Deleted

## 2016-10-25 ENCOUNTER — Ambulatory Visit (INDEPENDENT_AMBULATORY_CARE_PROVIDER_SITE_OTHER): Payer: Medicare Other | Admitting: Family Medicine

## 2016-10-25 ENCOUNTER — Telehealth: Payer: Self-pay

## 2016-10-25 VITALS — BP 146/62 | HR 66 | Temp 98.8°F | Ht 61.0 in | Wt 127.5 lb

## 2016-10-25 DIAGNOSIS — R05 Cough: Secondary | ICD-10-CM | POA: Diagnosis not present

## 2016-10-25 DIAGNOSIS — J441 Chronic obstructive pulmonary disease with (acute) exacerbation: Secondary | ICD-10-CM

## 2016-10-25 DIAGNOSIS — R059 Cough, unspecified: Secondary | ICD-10-CM

## 2016-10-25 DIAGNOSIS — R918 Other nonspecific abnormal finding of lung field: Secondary | ICD-10-CM | POA: Diagnosis not present

## 2016-10-25 DIAGNOSIS — I10 Essential (primary) hypertension: Secondary | ICD-10-CM

## 2016-10-25 NOTE — Assessment & Plan Note (Signed)
Well controlled. Continue current medication.  

## 2016-10-25 NOTE — Assessment & Plan Note (Signed)
Treated with antibiotics.. Will check CXR today to make sure changes improved. Given abn exam today.  Over all pt feel pretty good almost back at her breathing baseline.

## 2016-10-25 NOTE — Telephone Encounter (Signed)
Reviewed.See result note.

## 2016-10-25 NOTE — Progress Notes (Signed)
   Subjective:    Patient ID: Elizabeth Silva, female    DOB: August 13, 1918, 83 y.o.   MRN: [id number]  HPI   83 year old female presents for follow up with multiple issues.  1. She fell to floor when she was in kitchen, suddenly, was moving.  No weakness, did trip. Occurred in the Fall. No proceeding symptoms. No chest pain, no dizzyness  Hit bedpost on left arm.  No falls since. Abrasion on arm now healed.  2. With deep breaths having in right lower rib cage in last 2.5 weeks x 9 days. Now pain is gone.  3.She has recently been getting over .[place] Kitchen Feeling getting better.  No SOB.   Saw Tor Netters 10/18.. Treated for COPD exac.. With doxycycline, proair.  Now improving.   4.  HTN, tolerable on amlodipine, hydralazine, bystolic. Using lasix as needed.. Has not needed lately BP Readings from Last 3 Encounters:  10/25/16 (!) 146/62  08/01/16 130/60  07/17/16 139/62   Wt Readings from Last 3 Encounters:  10/25/16 127 lb 8 oz (57.8 kg)  08/01/16 122 lb 12.8 oz (55.7 kg)  07/17/16 125 lb 8 oz (56.9 kg)  Body mass index is 24.09 kg/m.     Review of Systems  Constitutional: Positive for fatigue. Negative for fever.  HENT: Negative for sinus pain.   Respiratory: Positive for cough, shortness of breath and wheezing.   Cardiovascular: Negative for chest pain, palpitations and leg swelling.  Gastrointestinal: Negative for abdominal distention.       Objective:   Physical Exam  Constitutional: Vital signs are normal. She appears well-developed and well-nourished. She is cooperative.  Non-toxic appearance. She does not appear ill. No distress.  HENT:  Head: Normocephalic.  Right Ear: Hearing, tympanic membrane, external ear and ear canal normal. Tympanic membrane is not erythematous, not retracted and not bulging.  Left Ear: Hearing, tympanic membrane, external ear and ear canal normal. Tympanic membrane is not erythematous, not retracted and not bulging.  Nose: No mucosal edema or  rhinorrhea. Right sinus exhibits no maxillary sinus tenderness and no frontal sinus tenderness. Left sinus exhibits no maxillary sinus tenderness and no frontal sinus tenderness.  Mouth/Throat: Uvula is midline, oropharynx is clear and moist and mucous membranes are normal.  Eyes: Conjunctivae, EOM and lids are normal. Pupils are equal, round, and reactive to light. Lids are everted and swept, no foreign bodies found.  Neck: Trachea normal and normal range of motion. Neck supple. Carotid bruit is not present. No thyroid mass and no thyromegaly present.  Cardiovascular: Normal rate, regular rhythm, S1 normal, S2 normal, normal heart sounds, intact distal pulses and normal pulses.  Exam reveals no gallop and no friction rub.   No murmur heard. Pulmonary/Chest: Effort normal. No tachypnea. No respiratory distress. She has no decreased breath sounds. She has wheezes in the right middle field and the right lower field. She has rhonchi in the right middle field and the right lower field. She has no rales.  Abdominal: Soft. Normal appearance and bowel sounds are normal. There is no tenderness.  Neurological: She is alert.  Skin: Skin is warm, dry and intact. No rash noted.  Psychiatric: Her speech is normal and behavior is normal. Judgment and thought content normal. Her mood appears not anxious. Cognition and memory are normal. She does not exhibit a depressed mood.          Assessment & Plan:

## 2016-10-25 NOTE — Patient Instructions (Addendum)
Okay to stop vitamin E and multivitamin.  Stop at X-ray on way

## 2016-10-25 NOTE — Telephone Encounter (Signed)
Tacy with [facility] called report on CXR that is in [place] and took copy of report to Dr Rometta Emery area.

## 2016-10-25 NOTE — Assessment & Plan Note (Signed)
ODes not want aggressive eval.. But may want to consider repeat chest CT at 1 year point... Now.

## 2016-10-25 NOTE — Progress Notes (Signed)
Pre visit review using our clinic review tool, if applicable. No additional management support is needed unless otherwise documented below in the visit note. 

## 2016-10-28 ENCOUNTER — Emergency Department: Payer: Medicare Other

## 2016-10-28 ENCOUNTER — Encounter: Payer: Self-pay | Admitting: *Deleted

## 2016-10-28 ENCOUNTER — Inpatient Hospital Stay
Admission: EM | Admit: 2016-10-28 | Discharge: 2016-11-02 | DRG: 291 | Disposition: A | Discharge status: Skilled Nursing Facility | Payer: Medicare Other | Attending: Internal Medicine | Admitting: Internal Medicine

## 2016-10-28 DIAGNOSIS — J180 Bronchopneumonia, unspecified organism: Secondary | ICD-10-CM | POA: Diagnosis not present

## 2016-10-28 DIAGNOSIS — B370 Candidal stomatitis: Secondary | ICD-10-CM | POA: Diagnosis not present

## 2016-10-28 DIAGNOSIS — K5730 Diverticulosis of large intestine without perforation or abscess without bleeding: Secondary | ICD-10-CM | POA: Diagnosis present

## 2016-10-28 DIAGNOSIS — J441 Chronic obstructive pulmonary disease with (acute) exacerbation: Secondary | ICD-10-CM | POA: Diagnosis not present

## 2016-10-28 DIAGNOSIS — I447 Left bundle-branch block, unspecified: Secondary | ICD-10-CM | POA: Diagnosis present

## 2016-10-28 DIAGNOSIS — E785 Hyperlipidemia, unspecified: Secondary | ICD-10-CM | POA: Diagnosis present

## 2016-10-28 DIAGNOSIS — Z9049 Acquired absence of other specified parts of digestive tract: Secondary | ICD-10-CM

## 2016-10-28 DIAGNOSIS — N189 Chronic kidney disease, unspecified: Secondary | ICD-10-CM | POA: Diagnosis not present

## 2016-10-28 DIAGNOSIS — N179 Acute kidney failure, unspecified: Secondary | ICD-10-CM | POA: Diagnosis present

## 2016-10-28 DIAGNOSIS — I509 Heart failure, unspecified: Secondary | ICD-10-CM

## 2016-10-28 DIAGNOSIS — Z7982 Long term (current) use of aspirin: Secondary | ICD-10-CM

## 2016-10-28 DIAGNOSIS — J9600 Acute respiratory failure, unspecified whether with hypoxia or hypercapnia: Secondary | ICD-10-CM | POA: Diagnosis not present

## 2016-10-28 DIAGNOSIS — R0602 Shortness of breath: Secondary | ICD-10-CM

## 2016-10-28 DIAGNOSIS — Z818 Family history of other mental and behavioral disorders: Secondary | ICD-10-CM

## 2016-10-28 DIAGNOSIS — R531 Weakness: Secondary | ICD-10-CM | POA: Diagnosis not present

## 2016-10-28 DIAGNOSIS — I5022 Chronic systolic (congestive) heart failure: Secondary | ICD-10-CM | POA: Diagnosis present

## 2016-10-28 DIAGNOSIS — E871 Hypo-osmolality and hyponatremia: Secondary | ICD-10-CM | POA: Diagnosis present

## 2016-10-28 DIAGNOSIS — J9601 Acute respiratory failure with hypoxia: Secondary | ICD-10-CM | POA: Diagnosis not present

## 2016-10-28 DIAGNOSIS — K219 Gastro-esophageal reflux disease without esophagitis: Secondary | ICD-10-CM | POA: Diagnosis not present

## 2016-10-28 DIAGNOSIS — Z880 Allergy status to penicillin: Secondary | ICD-10-CM

## 2016-10-28 DIAGNOSIS — E440 Moderate protein-calorie malnutrition: Secondary | ICD-10-CM | POA: Diagnosis present

## 2016-10-28 DIAGNOSIS — Z881 Allergy status to other antibiotic agents status: Secondary | ICD-10-CM

## 2016-10-28 DIAGNOSIS — J309 Allergic rhinitis, unspecified: Secondary | ICD-10-CM | POA: Diagnosis not present

## 2016-10-28 DIAGNOSIS — J440 Chronic obstructive pulmonary disease with acute lower respiratory infection: Secondary | ICD-10-CM | POA: Diagnosis present

## 2016-10-28 DIAGNOSIS — R262 Difficulty in walking, not elsewhere classified: Secondary | ICD-10-CM

## 2016-10-28 DIAGNOSIS — M6281 Muscle weakness (generalized): Secondary | ICD-10-CM | POA: Diagnosis not present

## 2016-10-28 DIAGNOSIS — Z833 Family history of diabetes mellitus: Secondary | ICD-10-CM

## 2016-10-28 DIAGNOSIS — J189 Pneumonia, unspecified organism: Secondary | ICD-10-CM | POA: Diagnosis not present

## 2016-10-28 DIAGNOSIS — R1312 Dysphagia, oropharyngeal phase: Secondary | ICD-10-CM | POA: Diagnosis not present

## 2016-10-28 DIAGNOSIS — R0600 Dyspnea, unspecified: Secondary | ICD-10-CM | POA: Diagnosis not present

## 2016-10-28 DIAGNOSIS — Z9981 Dependence on supplemental oxygen: Secondary | ICD-10-CM | POA: Diagnosis not present

## 2016-10-28 DIAGNOSIS — N183 Chronic kidney disease, stage 3 (moderate): Secondary | ICD-10-CM | POA: Diagnosis not present

## 2016-10-28 DIAGNOSIS — Z8249 Family history of ischemic heart disease and other diseases of the circulatory system: Secondary | ICD-10-CM

## 2016-10-28 DIAGNOSIS — C3490 Malignant neoplasm of unspecified part of unspecified bronchus or lung: Secondary | ICD-10-CM | POA: Diagnosis not present

## 2016-10-28 DIAGNOSIS — Z87891 Personal history of nicotine dependence: Secondary | ICD-10-CM | POA: Diagnosis not present

## 2016-10-28 DIAGNOSIS — Z515 Encounter for palliative care: Secondary | ICD-10-CM | POA: Diagnosis present

## 2016-10-28 DIAGNOSIS — Z7189 Other specified counseling: Secondary | ICD-10-CM | POA: Diagnosis not present

## 2016-10-28 DIAGNOSIS — D631 Anemia in chronic kidney disease: Secondary | ICD-10-CM | POA: Diagnosis not present

## 2016-10-28 DIAGNOSIS — R918 Other nonspecific abnormal finding of lung field: Secondary | ICD-10-CM | POA: Diagnosis not present

## 2016-10-28 DIAGNOSIS — D638 Anemia in other chronic diseases classified elsewhere: Secondary | ICD-10-CM | POA: Diagnosis present

## 2016-10-28 DIAGNOSIS — G9341 Metabolic encephalopathy: Secondary | ICD-10-CM | POA: Diagnosis not present

## 2016-10-28 DIAGNOSIS — I5043 Acute on chronic combined systolic (congestive) and diastolic (congestive) heart failure: Secondary | ICD-10-CM | POA: Diagnosis not present

## 2016-10-28 DIAGNOSIS — H547 Unspecified visual loss: Secondary | ICD-10-CM | POA: Diagnosis not present

## 2016-10-28 DIAGNOSIS — E875 Hyperkalemia: Secondary | ICD-10-CM | POA: Diagnosis present

## 2016-10-28 DIAGNOSIS — Z801 Family history of malignant neoplasm of trachea, bronchus and lung: Secondary | ICD-10-CM

## 2016-10-28 DIAGNOSIS — Z90710 Acquired absence of both cervix and uterus: Secondary | ICD-10-CM

## 2016-10-28 DIAGNOSIS — I130 Hypertensive heart and chronic kidney disease with heart failure and stage 1 through stage 4 chronic kidney disease, or unspecified chronic kidney disease: Principal | ICD-10-CM | POA: Diagnosis present

## 2016-10-28 DIAGNOSIS — R6889 Other general symptoms and signs: Secondary | ICD-10-CM | POA: Diagnosis not present

## 2016-10-28 DIAGNOSIS — I110 Hypertensive heart disease with heart failure: Secondary | ICD-10-CM | POA: Diagnosis not present

## 2016-10-28 DIAGNOSIS — G629 Polyneuropathy, unspecified: Secondary | ICD-10-CM | POA: Diagnosis not present

## 2016-10-28 DIAGNOSIS — Z7951 Long term (current) use of inhaled steroids: Secondary | ICD-10-CM

## 2016-10-28 DIAGNOSIS — Z66 Do not resuscitate: Secondary | ICD-10-CM | POA: Diagnosis not present

## 2016-10-28 DIAGNOSIS — R2689 Other abnormalities of gait and mobility: Secondary | ICD-10-CM | POA: Diagnosis not present

## 2016-10-28 DIAGNOSIS — Z800 Family history of malignant neoplasm of digestive organs: Secondary | ICD-10-CM

## 2016-10-28 DIAGNOSIS — Z8701 Personal history of pneumonia (recurrent): Secondary | ICD-10-CM

## 2016-10-28 DIAGNOSIS — Z7401 Bed confinement status: Secondary | ICD-10-CM | POA: Diagnosis not present

## 2016-10-28 LAB — CBC WITH DIFFERENTIAL/PLATELET
Basophils Absolute: 0.1 10*3/uL (ref 0–0.1)
Basophils Relative: 1 %
Eosinophils Absolute: 0 10*3/uL (ref 0–0.7)
Eosinophils Relative: 0 %
HEMATOCRIT: 37.4 % (ref 35.0–47.0)
Hemoglobin: 12.1 g/dL (ref 12.0–16.0)
LYMPHS ABS: 2.6 10*3/uL (ref 1.0–3.6)
Lymphocytes Relative: 24 %
MCH: 29.1 pg (ref 26.0–34.0)
MCHC: 32.3 g/dL (ref 32.0–36.0)
MCV: 90.3 fL (ref 80.0–100.0)
MONOS PCT: 11 %
Monocytes Absolute: 1.2 10*3/uL — ABNORMAL HIGH (ref 0.2–0.9)
NEUTROS ABS: 7 10*3/uL — AB (ref 1.4–6.5)
NEUTROS PCT: 64 %
Platelets: 225 10*3/uL (ref 150–440)
RBC: 4.14 MIL/uL (ref 3.80–5.20)
RDW: 15.2 % — ABNORMAL HIGH (ref 11.5–14.5)
WBC: 11 10*3/uL (ref 3.6–11.0)

## 2016-10-28 LAB — COMPREHENSIVE METABOLIC PANEL
ALK PHOS: 70 U/L (ref 38–126)
ALT: 17 U/L (ref 14–54)
ANION GAP: 12 (ref 5–15)
AST: 46 U/L — ABNORMAL HIGH (ref 15–41)
Albumin: 3.8 g/dL (ref 3.5–5.0)
BILIRUBIN TOTAL: 0.4 mg/dL (ref 0.3–1.2)
BUN: 40 mg/dL — ABNORMAL HIGH (ref 6–20)
CALCIUM: 8.8 mg/dL — AB (ref 8.9–10.3)
CO2: 21 mmol/L — ABNORMAL LOW (ref 22–32)
Chloride: 101 mmol/L (ref 101–111)
Creatinine, Ser: 2.18 mg/dL — ABNORMAL HIGH (ref 0.44–1.00)
GFR, EST AFRICAN AMERICAN: 21 mL/min — AB (ref >60)
GFR, EST NON AFRICAN AMERICAN: 18 mL/min — AB (ref >60)
GLUCOSE: 217 mg/dL — AB (ref 65–99)
Potassium: 5.2 mmol/L — ABNORMAL HIGH (ref 3.5–5.1)
Sodium: 134 mmol/L — ABNORMAL LOW (ref 135–145)
TOTAL PROTEIN: 7.7 g/dL (ref 6.5–8.1)

## 2016-10-28 LAB — PROCALCITONIN: PROCALCITONIN: 0.21 ng/mL

## 2016-10-28 LAB — BRAIN NATRIURETIC PEPTIDE: B NATRIURETIC PEPTIDE 5: 1008 pg/mL — AB (ref 0.0–100.0)

## 2016-10-28 LAB — LACTIC ACID, PLASMA: Lactic Acid, Venous: 1.7 mmol/L (ref 0.5–1.9)

## 2016-10-28 LAB — TROPONIN I: Troponin I: 0.03 ng/mL (ref <0.03)

## 2016-10-28 MED ORDER — PANTOPRAZOLE SODIUM 40 MG PO TBEC
40.0000 mg | DELAYED_RELEASE_TABLET | Freq: Every day | ORAL | Status: DC
  Administered 2016-10-29 – 2016-11-02 (×5): 40 mg via ORAL
  Filled 2016-10-28 (×5): qty 1

## 2016-10-28 MED ORDER — FERROUS FUMARATE 325 (106 FE) MG PO TABS
1.0000 | ORAL_TABLET | Freq: Every day | ORAL | Status: DC
  Administered 2016-10-29 – 2016-11-02 (×5): 106 mg via ORAL
  Filled 2016-10-28 (×10): qty 1

## 2016-10-28 MED ORDER — ACETAMINOPHEN 325 MG PO TABS: 650.0000 mg | ORAL_TABLET | ORAL | Status: DC | PRN

## 2016-10-28 MED ORDER — NITROGLYCERIN 2 % TD OINT
TOPICAL_OINTMENT | TRANSDERMAL | Status: AC
  Filled 2016-10-28: qty 1

## 2016-10-28 MED ORDER — ALBUTEROL SULFATE (2.5 MG/3ML) 0.083% IN NEBU: 2.5000 mg | INHALATION_SOLUTION | RESPIRATORY_TRACT | Status: DC | PRN

## 2016-10-28 MED ORDER — BUDESONIDE 0.5 MG/2ML IN SUSP
0.5000 mg | Freq: Two times a day (BID) | RESPIRATORY_TRACT | Status: DC
  Administered 2016-10-29 – 2016-11-02 (×9): 0.5 mg via RESPIRATORY_TRACT
  Filled 2016-10-28 (×10): qty 2

## 2016-10-28 MED ORDER — ASPIRIN EC 81 MG PO TBEC
81.0000 mg | DELAYED_RELEASE_TABLET | Freq: Every day | ORAL | Status: DC
  Administered 2016-10-29 – 2016-11-02 (×5): 81 mg via ORAL
  Filled 2016-10-28 (×5): qty 1

## 2016-10-28 MED ORDER — DEXTROSE 5 % IV SOLN: 1.0000 g | INTRAVENOUS | Status: DC

## 2016-10-28 MED ORDER — CEFTRIAXONE SODIUM-DEXTROSE 1-3.74 GM-% IV SOLR
1.0000 g | INTRAVENOUS | Status: DC
  Administered 2016-10-28 – 2016-10-31 (×4): 1 g via INTRAVENOUS
  Filled 2016-10-28 (×5): qty 50

## 2016-10-28 MED ORDER — ONDANSETRON HCL 4 MG/2ML IJ SOLN
4.0000 mg | Freq: Once | INTRAMUSCULAR | Status: AC
  Administered 2016-10-28: 4 mg via INTRAVENOUS

## 2016-10-28 MED ORDER — DEXTROSE 5 % IV SOLN
500.0000 mg | Freq: Once | INTRAVENOUS | Status: AC
  Administered 2016-10-28: 500 mg via INTRAVENOUS
  Filled 2016-10-28: qty 500

## 2016-10-28 MED ORDER — IPRATROPIUM-ALBUTEROL 0.5-2.5 (3) MG/3ML IN SOLN
3.0000 mL | Freq: Four times a day (QID) | RESPIRATORY_TRACT | Status: DC
  Administered 2016-10-29 – 2016-11-02 (×19): 3 mL via RESPIRATORY_TRACT
  Filled 2016-10-28 (×19): qty 3

## 2016-10-28 MED ORDER — AMLODIPINE BESYLATE 5 MG PO TABS
5.0000 mg | ORAL_TABLET | Freq: Two times a day (BID) | ORAL | Status: DC
  Administered 2016-10-29 – 2016-11-01 (×7): 5 mg via ORAL
  Filled 2016-10-28 (×8): qty 1

## 2016-10-28 MED ORDER — NITROGLYCERIN 2 % TD OINT
1.0000 [in_us] | TOPICAL_OINTMENT | Freq: Once | TRANSDERMAL | Status: AC
  Administered 2016-10-28: 1 [in_us] via TOPICAL

## 2016-10-28 MED ORDER — FUROSEMIDE 10 MG/ML IJ SOLN: 40.0000 mg | Freq: Once | INTRAMUSCULAR | Status: DC

## 2016-10-28 MED ORDER — TRAMADOL HCL 50 MG PO TABS: 50.0000 mg | ORAL_TABLET | Freq: Three times a day (TID) | ORAL | Status: DC | PRN

## 2016-10-28 MED ORDER — SODIUM CHLORIDE 0.9 % IV SOLN: INTRAVENOUS | Status: DC

## 2016-10-28 MED ORDER — HEPARIN SODIUM (PORCINE) 5000 UNIT/ML IJ SOLN
5000.0000 [IU] | Freq: Three times a day (TID) | INTRAMUSCULAR | Status: DC
  Administered 2016-10-29 – 2016-11-01 (×13): 5000 [IU] via SUBCUTANEOUS
  Filled 2016-10-28 (×15): qty 1

## 2016-10-28 MED ORDER — ONDANSETRON HCL 4 MG/2ML IJ SOLN: 4.0000 mg | Freq: Four times a day (QID) | INTRAMUSCULAR | Status: DC | PRN

## 2016-10-28 MED ORDER — SODIUM CHLORIDE 0.9 % IV SOLN: 250.0000 mL | INTRAVENOUS | Status: DC | PRN

## 2016-10-28 MED ORDER — PRAVASTATIN SODIUM 20 MG PO TABS
20.0000 mg | ORAL_TABLET | Freq: Every day | ORAL | Status: DC
  Administered 2016-10-29 – 2016-11-01 (×4): 20 mg via ORAL
  Filled 2016-10-28 (×4): qty 1

## 2016-10-28 MED ORDER — ONDANSETRON HCL 4 MG/2ML IJ SOLN
INTRAMUSCULAR | Status: AC
  Administered 2016-10-28: 4 mg via INTRAVENOUS
  Filled 2016-10-28: qty 2

## 2016-10-28 NOTE — ED Notes (Signed)
Report off to [employer].

## 2016-10-28 NOTE — H&P (Signed)
PULMONARY / CRITICAL CARE MEDICINE   Name: Elizabeth Silva MRN: [id number] DOB: 02/18/1918    ADMISSION DATE:  10/28/2016   REFERRING MD:  Dr. Kerman Passey  CHIEF COMPLAINT:  Shortness of breath  HISTORY OF PRESENT ILLNESS:   83 Y/O female with a past medical history of COPD, congestive heart failure, hyperlipidemia, hypertension and chronic kidney disease presenting via EMS with complaints of dyspnea. Patient lives in a nursing home. History is obtained from patient and ED/EMS records. Patient states that she was recently treated for a respiratory tract infection with an antibiotic and inhaler and she felt better but yesterday she suddenly became short of breath. Upon EMS arrival, patient was hypoxic with SPO2 in the low 80s. She was placed on CPAP and transferred to the emergency room. At the ED, SPO2 remained in the 80s. She was placed on BiPAP with some improvement. She reports mild improvement in dyspnea. Denies chest pain, palpitations, nausea, vomiting, diarrhea and dizziness. PCCM was called to admit.  Her chest x-ray showed multilobar bronchopneumonia. Per EMS, patient was recently diagnosed with lung cancer, but declined treatment due to her age. She was seen by Dr. Oliva Bustard in 2013 but I do not see a follow-up lung biopsy or bronchoscopy. She has been treated for multiple upper respiratory tract infections and, COPD exacerbations   PAST MEDICAL HISTORY :  She  has a past medical history of Allergic rhinitis; CKD (chronic kidney disease); COPD (chronic obstructive pulmonary disease) ([place]); Diastolic CHF, acute ([place]); Diverticulosis of colon; GERD (gastroesophageal reflux disease); GI bleed; H/O Clostridium difficile infection (Sept. 2013); History of PFTs; Hyperlipidemia; Hypertension; Impaired vision; LBBB (left bundle branch block); and Osteoarthritis.  PAST SURGICAL HISTORY: She  has a past surgical history that includes Cardiac catheterization (1999); Total abdominal hysterectomy (1970);  Cholecystectomy (1980); and Cervical disc surgery (2005).  Allergies  Allergen Reactions  . Levofloxacin     REACTION: Burning in legs  . Penicillins     REACTION: Rash    No current facility-administered medications on file prior to encounter.    Current Outpatient Prescriptions on File Prior to Encounter  Medication Sig  . acetaminophen (TYLENOL) 500 MG tablet Take 500 mg by mouth every 6 (six) hours as needed.    Marland Kitchen ADVAIR DISKUS 250-50 MCG/DOSE AEPB Inhale 1 puff into the  lungs two times daily  . amLODipine (NORVASC) 5 MG tablet TAKE 1 TABLET BY MOUTH TWO  TIMES DAILY  . aspirin 81 MG EC tablet Take 81 mg by mouth daily.    Marland Kitchen BYSTOLIC 10 MG tablet Take 1 tablet by mouth  daily  . ferrous fumarate (HEMOCYTE - 106 MG FE) 325 (106 FE) MG TABS tablet Take 1 tablet (106 mg of iron total) by mouth daily.  . fluticasone (FLONASE) 50 MCG/ACT nasal spray Place 2 sprays into both nostrils daily.  . furosemide (LASIX) 20 MG tablet TAKE 1 TABLET BY MOUTH  DAILY AS NEEDED  . gabapentin (NEURONTIN) 600 MG tablet Take 1 tablet by mouth at  bedtime  . hydrALAZINE (APRESOLINE) 100 MG tablet TAKE 1 TABLET BY MOUTH 3  TIMES A DAY  . Lactobacillus Rhamnosus, GG, (CULTURELLE PO) Take 1 tablet by mouth 2 (two) times daily.  Marland Kitchen lovastatin (MEVACOR) 20 MG tablet Take 1 tablet by mouth at  bedtime  . omeprazole (PRILOSEC) 40 MG capsule Take 1 capsule (40 mg total) by mouth daily.  Marland Kitchen oxybutynin (DITROPAN) 5 MG tablet Take 1 tablet by mouth two  times daily  .  PROAIR HFA 108 (90 Base) MCG/ACT inhaler INHALE 2 PUFFS EVERY 6  HOURS AS NEEDED FOR  WHEEZING OR SHORTNESS OF  BREATH.  Marland Kitchen SPIRIVA HANDIHALER 18 MCG inhalation capsule INHALE THE CONTENTS OF 1  CAPSULE VIA HANDIHALER  DAILY  . traMADol (ULTRAM) 50 MG tablet Take 1 tablet (50 mg total) by mouth every 8 (eight) hours as needed.  . vitamin E 1000 UNIT capsule Take 1,000 Units by mouth daily.      FAMILY HISTORY:  Her indicated that her mother is  deceased. She indicated that her father is deceased. She reported the following about her sister: unknown.She reported the following about her brother: unknown.   SOCIAL HISTORY: She  reports that she quit smoking about 32 years ago. Her smoking use included Cigarettes. She smoked 1.00 pack per day. She has never used smokeless tobacco. She reports that she drinks alcohol. She reports that she does not use drugs.  REVIEW OF SYSTEMS:   Limited as patient is on BiPAP Constitutional: Negative for fever and chills but positive for generalized malaise.  HENT: Negative for congestion and rhinorrhea.  Eyes: Negative for redness and visual disturbance.  Respiratory: Positive for shortness of breath but negative wheezing.  Cardiovascular: Negative for chest pain and palpitations.  Gastrointestinal: Negative  for nausea , vomiting and abdominal pain and  Loose stools Genitourinary: Negative for dysuria and urgency.  Endocrine: Denies polyuria, polyphagia and heat intolerance Musculoskeletal: Negative for myalgias and arthralgias  Skin: Negative for pallor and wound.  Neurological: Negative for dizziness and headaches   SUBJECTIVE:   VITAL SIGNS: BP (!) 117/55   Pulse 72   Temp 99.2 F (37.3 C) (Oral)   Resp (!) 28   Ht '5\' 2"'$  (1.575 m)   Wt 58 kg (127 lb 13.9 oz)   SpO2 100%   BMI 23.39 kg/m   HEMODYNAMICS:    VENTILATOR SETTINGS:    INTAKE / OUTPUT: No intake/output data recorded.  PHYSICAL EXAMINATION: General: Chronically ill-looking, older adult, in moderate distress respiratory distress Neuro: Alert to person and place, speech is normal, follows, moves, moves all extremities, no focal deficits noted HEENT:  Is normocephalic and nontraumatic, PERRLA, EMS passages without drainage, oral mucosa moist and pink, trachea midline Cardiovascular: Rate and rhythm regular, S1, S2 audible, no murmur, regurg or gallop Lungs: Mild increase in work of breathing, breath sounds  bilaterally, no wheezing, significantly diminished breath sounds in the bases, scattered rhonchi Abdomen: Nondistended, soft, normal bowel sounds in Musculoskeletal:  No visible joint deformities, no swelling, positive range of motion in upper and lower extremities Extremities: +2 pulses, no edema Skin: Warm and dry with no rash  LABS:  BMET  Recent Labs Lab 10/28/16 2021  NA 134*  K 5.2*  CL 101  CO2 21*  BUN 40*  CREATININE 2.18*  GLUCOSE 217*    Electrolytes  Recent Labs Lab 10/28/16 2021  CALCIUM 8.8*    CBC  Recent Labs Lab 10/28/16 2021  WBC 11.0  HGB 12.1  HCT 37.4  PLT 225    Coag's No results for input(s): APTT, INR in the last 168 hours.  Sepsis Markers No results for input(s): LATICACIDVEN, PROCALCITON, O2SATVEN in the last 168 hours.  ABG No results for input(s): PHART, PCO2ART, PO2ART in the last 168 hours.  Liver Enzymes  Recent Labs Lab 10/28/16 2021  AST 46*  ALT 17  ALKPHOS 70  BILITOT 0.4  ALBUMIN 3.8    Cardiac Enzymes  Recent Labs Lab 10/28/16  2021  TROPONINI 0.03*    Glucose No results for input(s): GLUCAP in the last 168 hours.  Imaging Dg Chest Portable 1 View  Result Date: 10/28/2016 CLINICAL DATA:  Shortness of breath EXAM: PORTABLE CHEST 1 VIEW COMPARISON:  10/25/2016, 08/01/2016, 10/14/2015 FINDINGS: Mild diffuse coarse interstitial opacities are likely chronic. Slight increased opacity in the right mid lung and left base, atelectasis versus mild infiltrate. Stable enlarged cardiomediastinal silhouette without overt failure. Apical pleural scarring. No pneumothorax. Atherosclerosis of the aorta. IMPRESSION: 1. Slight increased right mid lung zone opacity with persistent vague opacity in the left mid lung zone and left base, findings could relate to multifocal infiltrates however nodules not excluded. Chest CT correlation was previously recommended. Electronically Signed   By: Donavan Foil M.D.   On: 10/28/2016  20:45    STUDIES:  2-D echo  CULTURES: Blood cultures 2. Urine culture  ANTIBIOTICS: Ceftriaxone 10/28/2016 Azithromycin 10/28/2016  SIGNIFICANT EVENTS: 10/28/2016: ED with dyspnea, pneumonia on chest x-ray  LINES/TUBES: Peripheral IVs  DISCUSSION: 83 year old female presenting with recurrent pneumonia, and COPD exacerbation  ASSESSMENT / PLAN:  PULMONARY A: Acute hypoxemic respiratory failure necessitating BiPAP Community-acquired pneumonia Acute COPD exacerbation Questionable lung malignancy P:   Continuous BiPAP  and titrate off to nasal cannula as tolerated Nebulized bronchodilators and steroids Chest x-ray as needed Empiric antibiotics as above Aggressive pulmonary hygiene  CARDIOVASCULAR A:  Diastolic heart failure-questionable exacerbation, last echo was in 2012 History of hypertension History of hyperlipidemia P:  2-D echo Resume all home cardiac medications Hemodynamic monitoring per ICU protocol  RENAL A:   Acute on chronic renal failure P:   Gentle hydration Monitor and replace electrolytes  GASTROINTESTINAL A:   History of GERD P:   Keep nothing by mouth while on BiPAP. Protonix 40 mg by mouth daily  HEMATOLOGIC A:   Questionable lung malignancy P:  Palliative care consulted for establishment of care goals  INFECTIOUS A:   Recurrent pneumonia P:   Antibiotics as above. Follow-up cultures  ENDOCRINE A:   No acute issues   P:   Monitor blood glucose with a PMH  NEUROLOGIC A:   No acute issues P:   RASS goal:  Monitor for ICU induced delivery   FAMILY  - Updates: Family at bedside. We will updated when available  - Inter-disciplinary family meet or Palliative Care meeting due by:  day 7  Plan of care discussed with Dr. Ashby Dawes and [facility] physician   Sierrah Luevano S. [facility] ANP-BC Pulmonary and Critical Care Medicine [facility] Pager [phone] or [phone] 10/28/2016, 10:23 PM

## 2016-10-28 NOTE — ED Triage Notes (Signed)
Pt brought in via ems from [facility] [place] with sob.  Pt on cpap on arrival to treatment room 16.  md at bedside.  Pt alert.

## 2016-10-28 NOTE — ED Notes (Signed)
Report called to [place], RN.

## 2016-10-28 NOTE — ED Provider Notes (Signed)
[facility] Emergency Department Provider Note  Time seen: 8:22 PM  I have reviewed the triage vital signs and the nursing notes.   HISTORY  Chief Complaint No chief complaint on file.    HPI Elizabeth Silva is a 83 y.o. female with a past medical history of CK D, COPD, CHF, hypertension, hyperlipidemia, presents to the emergency department with shortness of breath. According to EMS the patient is from a nursing facility. Tonight they noted the patient to be significantly short of breath so they called EMS to bring her to the emergency department for evaluation.  EMS report the patient was recently diagnosed with lung cancer and they opted not to treat given her age.Here the patient's only complaint is shortness of breath. Denies any chest pain. Patient arrives on CPAP by EMS with a sat of 80%.  Past Medical History:  Diagnosis Date  . Allergic rhinitis   . CKD (chronic kidney disease)   . COPD (chronic obstructive pulmonary disease) ([place])   . Diastolic CHF, acute ([facility])    echo (4/11) with EF 60-65%,mild LVH, mild aortic insufficiency, mild MR, PA systolic pressure 06-30 mmHg, mild to moderate TR  . Diverticulosis of colon   . GERD (gastroesophageal reflux disease)   . GI bleed   . H/O Clostridium difficile infection Sept. 2013  . History of PFTs   . Hyperlipidemia   . Hypertension    LE swelling with amlodipine, intolerant of ACEIs  . Impaired vision    right eye, was told due to HTN  . LBBB (left bundle branch block)   . Osteoarthritis     Patient Active Problem List   Diagnosis Date Noted  . Cellulitis of leg, left 06/08/2016  . Acute renal failure ([place]) 06/08/2016  . Leg hematoma 05/30/2016  . Upper GI bleed 04/26/2016  . Multiple pulmonary nodules 04/12/2016  . Chronic constipation 04/12/2016  . Melena 01/12/2016  . Abnormal chest x-ray 01/12/2016  . Advance directive discussed with patient 01/10/2016  . Vision loss, bilateral 10/26/2014  .  Carotid stenosis 10/26/2014  . Bilateral tinnitus 04/20/2014  . Prediabetes 01/07/2014  . Bilateral knee pain 01/07/2014  . Hip pain, left 01/07/2014  . COPD exacerbation ([place]) 10/14/2013  . Fatigue 08/28/2013  . Insomnia 01/02/2013  . Anemia of chronic renal failure 12/25/2010  . CONSTIPATION 12/08/2010  . DIASTOLIC HEART FAILURE, CHRONIC 03/02/2010  . CAROTID BRUIT, RIGHT 03/02/2010  . INCONTINENCE, URGE 01/25/2010  . PERIPHERAL NEUROPATHY, IDIOPATHIC 05/10/2008  . DYSURIA, CHRONIC 02/27/2008  . PERIPHERAL VASCULAR DISEASE 05/05/2007  . Chronic venous insufficiency 05/05/2007  . ALLERGIC RHINITIS 05/05/2007  . COPD, mild ([place]) 05/05/2007  . GERD 05/05/2007  . DIVERTICULOSIS, COLON 05/05/2007  . Osteoarthritis, multiple sites 05/05/2007  . DEGENERATION, CERVICAL DISC 05/05/2007  . STRESS INCONTINENCE 05/05/2007  . HYPERCHOLESTEROLEMIA, PURE 04/07/2007  . RENAL STONE 04/07/2007  . Essential hypertension, benign 03/17/2007  . Chronic kidney disease, stage 4, severely decreased GFR ([place]) 03/17/2007  . CARDIAC MURMUR 03/17/2007    Past Surgical History:  Procedure Laterality Date  . CARDIAC CATHETERIZATION  1999   normal  . CERVICAL [place] SURGERY  2005   cerv. disc, dz. C5-C7, facet arthritis  . CHOLECYSTECTOMY  1980  . TOTAL ABDOMINAL HYSTERECTOMY  1970   suspicious cells    Prior to Admission medications   Medication Sig Start Date End Date Taking? Authorizing Provider  acetaminophen (TYLENOL) 500 MG tablet Take 500 mg by mouth every 6 (six) hours as needed.  Historical Provider, MD  ADVAIR DISKUS 250-50 MCG/DOSE AEPB Inhale 1 puff into the  lungs two times daily 12/27/15   Amy E Bedsole, MD  amLODipine (NORVASC) 5 MG tablet TAKE 1 TABLET BY MOUTH TWO  TIMES DAILY 08/12/16   Amy Cletis Athens, MD  aspirin 81 MG EC tablet Take 81 mg by mouth daily.      Historical Provider, MD  BYSTOLIC 10 MG tablet Take 1 tablet by mouth  daily 06/11/16   Amy E Diona Browner, MD  ferrous  fumarate (HEMOCYTE - 106 MG FE) 325 (106 FE) MG TABS tablet Take 1 tablet (106 mg of iron total) by mouth daily. 10/27/13   Amy Cletis Athens, MD  fluticasone (FLONASE) 50 MCG/ACT nasal spray Place 2 sprays into both nostrils daily. 03/20/16   Amy Cletis Athens, MD  furosemide (LASIX) 20 MG tablet TAKE 1 TABLET BY MOUTH  DAILY AS NEEDED 10/17/16   Amy Cletis Athens, MD  gabapentin (NEURONTIN) 600 MG tablet Take 1 tablet by mouth at  bedtime 06/26/16   Amy E Diona Browner, MD  hydrALAZINE (APRESOLINE) 100 MG tablet TAKE 1 TABLET BY MOUTH 3  TIMES A DAY 09/17/16   Amy E Bedsole, MD  Lactobacillus Rhamnosus, GG, (CULTURELLE PO) Take 1 tablet by mouth 2 (two) times daily.    Historical Provider, MD  lovastatin (MEVACOR) 20 MG tablet Take 1 tablet by mouth at  bedtime 03/30/16   Minna Merritts, MD  omeprazole (PRILOSEC) 40 MG capsule Take 1 capsule (40 mg total) by mouth daily. 07/10/16   Amy Cletis Athens, MD  oxybutynin (DITROPAN) 5 MG tablet Take 1 tablet by mouth two  times daily 05/25/16   Amy E Diona Browner, MD  PROAIR HFA 108 (90 Base) MCG/ACT inhaler INHALE 2 PUFFS EVERY 6  HOURS AS NEEDED FOR  WHEEZING OR SHORTNESS OF  BREATH. 10/17/16   Amy Cletis Athens, MD  SPIRIVA HANDIHALER 18 MCG inhalation capsule INHALE THE CONTENTS OF 1  CAPSULE VIA HANDIHALER  DAILY 08/09/16   Amy Cletis Athens, MD  traMADol (ULTRAM) 50 MG tablet Take 1 tablet (50 mg total) by mouth every 8 (eight) hours as needed. 09/18/16   Amy Cletis Athens, MD  vitamin E 1000 UNIT capsule Take 1,000 Units by mouth daily.      Historical Provider, MD    Allergies  Allergen Reactions  . Levofloxacin     REACTION: Burning in legs  . Penicillins     REACTION: Rash    Family History  Problem Relation Age of Onset  . Heart block Mother     complete  . Dementia Father   . Colon cancer Sister   . Dementia Sister   . Lung cancer Brother   . Cancer Brother     sinus  . Diabetes Brother     Social History Social History  Substance Use Topics  . Smoking status: Former  Smoker    Packs/day: 1.00    Types: Cigarettes    Quit date: 10/15/1984  . Smokeless tobacco: Never Used  . Alcohol use 0.0 oz/week    Review of Systems Constitutional: Negative for fever. Cardiovascular: Negative for chest pain. Respiratory: Positive for shortness of breath. Gastrointestinal: Negative for abdominal pain Genitourinary: Negative for dysuria. Neurological: Negative for headache 10-point ROS otherwise negative.  ____________________________________________   PHYSICAL EXAM:  Constitutional: Alert alert. Moderate distress due to difficulty breathing currently on CPAP. Eyes: Normal exam ENT   Head: Normocephalic and atraumatic   Mouth/Throat: Mucous membranes are  moist. Cardiovascular: Normal rate, regular rhythm. Respiratory: Tachypnea with diffuse rhonchi. No audible wheeze. Gastrointestinal: Soft and nontender. Mild distention. Musculoskeletal: Nontender with normal range of motion in all extremities. No lower extremity tenderness or edema Neurologic:  Normal speech and language. No gross focal neurologic deficits Skin:  Skin is warm, dry and intact.  Psychiatric: Mood and affect are normal.  ____________________________________________    EKG  EKG reviewed and interpreted by myself shows sinus rhythm at 88 bpm, widened QRS, normal axis, largely normal intervals with nonspecific ST changes, difficult to interpret given a L interference from BiPAP. No obvious ST elevation.  ____________________________________________    RADIOLOGY  X-ray shows bilateral masses with possible infection.  ____________________________________________   INITIAL IMPRESSION / ASSESSMENT AND PLAN / ED COURSE  Pertinent labs & imaging results that were available during my care of the patient were reviewed by me and considered in my medical decision making (see chart for details).  Patient presents the emergency department shortness of breath. Diffuse rhonchi on  examination. Patient's blood pressure is elevated we will place 1 inch of nitroglycerin paste, continue with BiPAP in the emergency department while awaiting labs and chest x-ray results. EMS states the patient is full code as far as they know.  Patient is doing much better on BiPAP. Now resting comfortably. Saturation of 98% currently. Patient's BNP is also elevated greater than 1000. Patient's chest x-ray shows nodules versus multifocal pneumonia. We will cover with antibiotics, check blood cultures and admit to the hospital for further treatment.  ____________________________________________   FINAL CLINICAL IMPRESSION(S) / ED DIAGNOSES  Dyspnea Multifocal pneumonia   Harvest Dark, MD 10/28/16 2110

## 2016-10-28 NOTE — ED Notes (Signed)
This RN to bedside, notified by Ena Dawley, RN and Eustaquio Maize, EDT that patient was attempted to be transferred to the toilet to have a bowel movement when her BP was noted to be low by Ena Dawley, Therapist, sports. MD notified, Dr. Kerman Passey at bedside at this time. Per Dr. Kerman Passey the nitro paste to patient's L upper chest was removed and cleaned off due to her low BP. Pt alert upon this RN's arrival to the room. Pt assisted to the bedpan by [facility], EDT. [name] remains at bedside at this time.

## 2016-10-28 NOTE — ED Notes (Signed)
Pt brought in [facility] from [facility] with [facility].  Pt is on bipap with rt and md at bedside.  Pt alert. Iv started stat and labs sent.  meds given.  Pt states she is sob.  Denies chest pain.  Skin warm and dry.

## 2016-10-28 NOTE — ED Notes (Signed)
This RN to bedside at this time. Pt c/o nausea, BiPap removed in case of patient vomiting, MD notified. VORB with Dr. Virgie Dad for '4mg'$  zofran IV, Dr. Kerman Passey also requests a trial off of Bipap, pt off Bipap for approx 10 mins, desat to 92%. [name] at bedside at this time. Will continue to monitor for further patient needs.

## 2016-10-29 ENCOUNTER — Inpatient Hospital Stay: Payer: Medicare Other

## 2016-10-29 ENCOUNTER — Inpatient Hospital Stay (HOSPITAL_COMMUNITY)
Admit: 2016-10-29 | Discharge: 2016-10-29 | Disposition: A | Discharge status: Home / Self Care | Payer: Medicare Other | Attending: Adult Health | Admitting: Adult Health

## 2016-10-29 DIAGNOSIS — R0600 Dyspnea, unspecified: Secondary | ICD-10-CM

## 2016-10-29 DIAGNOSIS — J9601 Acute respiratory failure with hypoxia: Secondary | ICD-10-CM

## 2016-10-29 LAB — CBC
HCT: 29.6 % — ABNORMAL LOW (ref 35.0–47.0)
Hemoglobin: 9.8 g/dL — ABNORMAL LOW (ref 12.0–16.0)
MCH: 29.1 pg (ref 26.0–34.0)
MCHC: 33 g/dL (ref 32.0–36.0)
MCV: 88.1 fL (ref 80.0–100.0)
Platelets: 158 K/uL (ref 150–440)
RBC: 3.36 MIL/uL — ABNORMAL LOW (ref 3.80–5.20)
RDW: 14.7 % — ABNORMAL HIGH (ref 11.5–14.5)
WBC: 5.9 K/uL (ref 3.6–11.0)

## 2016-10-29 LAB — ECHOCARDIOGRAM COMPLETE
Height: 62 in
Weight: 2031.76 [oz_av]

## 2016-10-29 LAB — PHOSPHORUS: Phosphorus: 5.4 mg/dL — ABNORMAL HIGH (ref 2.5–4.6)

## 2016-10-29 LAB — BASIC METABOLIC PANEL
Anion gap: 10 (ref 5–15)
BUN: 48 mg/dL — ABNORMAL HIGH (ref 6–20)
CALCIUM: 8.1 mg/dL — AB (ref 8.9–10.3)
CHLORIDE: 104 mmol/L (ref 101–111)
CO2: 22 mmol/L (ref 22–32)
CREATININE: 1.95 mg/dL — AB (ref 0.44–1.00)
GFR calc Af Amer: 24 mL/min — ABNORMAL LOW (ref >60)
GFR calc non Af Amer: 21 mL/min — ABNORMAL LOW (ref >60)
GLUCOSE: 108 mg/dL — AB (ref 65–99)
Potassium: 4.8 mmol/L (ref 3.5–5.1)
Sodium: 136 mmol/L (ref 135–145)

## 2016-10-29 LAB — MRSA PCR SCREENING: MRSA by PCR: NEGATIVE

## 2016-10-29 LAB — GLUCOSE, CAPILLARY: GLUCOSE-CAPILLARY: 204 mg/dL — AB (ref 65–99)

## 2016-10-29 LAB — MAGNESIUM: Magnesium: 2.2 mg/dL (ref 1.7–2.4)

## 2016-10-29 MED ORDER — DEXTROSE 5 % IV SOLN
500.0000 mg | INTRAVENOUS | Status: DC
  Administered 2016-10-29 – 2016-11-01 (×4): 500 mg via INTRAVENOUS
  Filled 2016-10-29 (×5): qty 500

## 2016-10-29 MED ORDER — DEXTROSE-NACL 5-0.45 % IV SOLN
INTRAVENOUS | Status: DC
  Administered 2016-10-29: 09:00:00 via INTRAVENOUS

## 2016-10-29 NOTE — Care Management Note (Signed)
Case Management Note  Patient Details  Name: Elizabeth Silva MRN: [id number] Date of Birth: 06-18-1918  Subjective/Objective:                  HCPOA is daughter Steva Colder in [place] and Sharon Seller daughter is second [phone]. Collie Siad will bring that documentation in for patient record. Patient uses rollator, baths and feed self at [facility]. Patient is able to make her own health care decisions per Collie Siad. Per daughter Collie Siad patient is a full code and wishes to remain a full code but will discuss with patient and siblings. PCP Dr. Diona Browner at [facility]. O2 is acute. Patient was [facility] group aerobic [facility].  Action/Plan: RNCM will continue to follow. Changed CSW order to admitted from facility instead of Advanced Directives.   Expected Discharge Date:                  Expected Discharge Plan:     In-House Referral:  Clinical Social Work  Discharge planning Services  CM Consult  Post Acute Care Choice:    Choice offered to:  Adult Children  DME Arranged:    DME Agency:     HH Arranged:    HH Agency:     Status of Service:  In process, will continue to follow  If discussed at Long Length of Stay Meetings, dates discussed:    Additional Comments:  Marshell Garfinkel, RN 10/29/2016, 8:23 AM

## 2016-10-29 NOTE — Plan of Care (Signed)
Endorsed pt to [employer]

## 2016-10-29 NOTE — Progress Notes (Signed)
10/29/16 0900  Charting Type  Charting Type Shift assessment  Orders Chart Check (once per shift) Completed  Neurological  Neuro (WDL) WDL  Glasgow Coma Scale  Eye Opening 4  Best Verbal Response (NON-intubated) 5  Best Motor Response 6  Glasgow Coma Scale Score 15  [place] Agitation Sedation Scale  [place] Agitation Sedation Scale (RASS) 0  RASS Goal 0  CAM-ICU Feature 1:  Acute onset of fluctuating course  Is the patient different from his/her baseline mental status? No  Has the patient had any fluctuation in mental status in the past 24 hours? No  CAM-ICU Feature 4:  Disorganized thinking  CAM-ICU Negative? Yes  Delirium Prevention:  Universal Requirements (Complete for all ICU patients)  Universal Precautions Initiated *See Row Information* Yes  HEENT  HEENT (WDL) X  Vision Check No  Teeth Dentures upper;Dentures lower  Tongue Pink;Moist  Mucous Membrane(s) Moist;Pink  Voice Clear  Respiratory  Respiratory (WDL) X  Respiratory Pattern Regular;Unlabored  Chest Assessment Chest expansion symmetrical  Bilateral Breath Sounds Diminished  R Upper  Breath Sounds Clear;Diminished  Cardiac  Cardiac (WDL) X  Pulse Regular  Heart Sounds S1, S2  Jugular Venous Distention (JVD) No  Cardiac Rhythm BBB (left BBB)  Telemetry Box Number [id number]  Tele Box Verification Completed by Second Verifier Completed  Antiarrhythmic device No  Vascular  Vascular (WDL) WDL  Capillary Refill Less than 3 seconds  Pulses R radial;L radial;R dorsalis pedis;L dorsalis pedis  Neurovascular Focused Assessment No  RUE Neurovascular Assessment  R Radial Pulse +2  LUE Neurovascular Assessment  L Radial Pulse +2  RLE Neurovascular Assessment  R Dorsalis Pedis Pulse +1  LLE Neurovascular Assessment  L Dorsalis Pedis Pulse +1  Integumentary  Integumentary (WDL) X  Skin Color Appropriate for ethnicity  Skin Condition Dry  Skin Integrity Intact;Ecchymosis  Ecchymosis Location Arm;Leg   Ecchymosis Location Orientation Circumferential;Bilateral  Ecchymosis Intervention Other (Comment) (assessed)  Skin Turgor Non-tenting  Sacral Foam Prophylactic Dressing Protocol  Dressing Interventions Dressing intact  Dressing Change Due 10/31/16  Braden Scale (Ages 79 and up)  Sensory Perceptions 4  Moisture 3  Activity 1  Mobility 3  Nutrition 2  Friction and Shear 3  Braden Scale Score 16  Braden Interventions  Braden Scale Interventions Pillow;Heels  Musculoskeletal  Musculoskeletal (WDL) X  Generalized Weakness Yes  Weight Bearing Restrictions No  Gastrointestinal  Gastrointestinal (WDL) WDL  Abdomen Inspection Soft  Bowel Sounds Assessment Active  Tenderness Nontender  GU Assessment  Genitourinary (WDL) WDL  Genitourinary Symptoms None  Genitalia  Female Genitalia Intact  Urine Characteristics  Urinary Incontinence Yes  Psychosocial  Psychosocial (WDL) WDL

## 2016-10-29 NOTE — Progress Notes (Signed)
Initial Nutrition Assessment  DOCUMENTATION CODES:   Non-severe (moderate) malnutrition in context of chronic illness  INTERVENTION:  1. Monitor and Encourage PO intake  NUTRITION DIAGNOSIS:   Malnutrition related to chronic illness as evidenced by moderate depletion of body fat, moderate depletions of muscle mass.  GOAL:   Patient will meet greater than or equal to 90% of their needs  MONITOR:   PO intake, I & O's, Labs, Weight trends  REASON FOR ASSESSMENT:   Malnutrition Screening Tool    ASSESSMENT:   82 Y/O female with a past medical history of COPD, congestive heart failure, hyperlipidemia, hypertension and chronic kidney disease presenting via EMS with complaints of dyspnea.  Spoke with Elizabeth Silva, children at bedside.  They report patient has good appetite, ate most of her breakfast this morning. Patient was receiving 3 meals per day at facility PTA, states she was eating ~75% at each meal there, and always ate dessert. Reports her weight being up 3# - appears to have gained 4-5# since previous admission per chart. Admits to some nausea this morning, but has subsided for the most part. She declined any interventions or snacks - causes her to not want to eat her meals. Nutrition-Focused physical exam completed. Findings are moderate fat depletion, moderate muscle depletion, and no edema.   Labs and medications reviewed: Phos 5.4 Iron D5 1/2 NS @ 77m/hr --> 204 calories  Diet Order:  Diet Carb Modified Fluid consistency: Thin; Room service appropriate? Yes  Skin:  Reviewed, no issues  Last BM:  PTA  Height:   Ht Readings from Last 1 Encounters:  10/28/16 '5\' 2"'$  (1.575 m)    Weight:   Wt Readings from Last 1 Encounters:  10/29/16 126 lb 15.8 oz (57.6 kg)    Ideal Body Weight:  50 kg  BMI:  Body mass index is 23.23 kg/m.  Estimated Nutritional Needs:   Kcal:  1111-1300 calories (MSJ x1.2-1.4)  Protein:  69-80 grams (1.2-1.4g/kg)  Fluid:  >/=  1.1L  EDUCATION NEEDS:   No education needs identified at this time  WSatira Anis Any Mcneice, MS, RD LDN Inpatient Clinical Dietitian Pager [phone]

## 2016-10-29 NOTE — Progress Notes (Signed)
PULMONARY / CRITICAL CARE MEDICINE   Name: Elizabeth Silva MRN: [id number] DOB: 1918-03-14    ADMISSION DATE:  10/28/2016   REFERRING MD:  Dr. Kerman Passey  CHIEF COMPLAINT:  Shortness of breath  HISTORY OF PRESENT ILLNESS:   83 Y/O female with a past medical history of COPD, congestive heart failure, hyperlipidemia, hypertension and chronic kidney disease presenting via EMS with complaints of dyspnea. Patient lives in a nursing home. History is obtained from patient and ED/EMS records. Patient states that she was recently treated for a respiratory tract infection with an antibiotic and inhaler and she felt better but yesterday she suddenly became short of breath. Upon EMS arrival, patient was hypoxic with SPO2 in the low 80s. She was placed on CPAP and transferred to the emergency room. At the ED, SPO2 remained in the 80s. She was placed on BiPAP with some improvement. She reports mild improvement in dyspnea. Denies chest pain, palpitations, nausea, vomiting, diarrhea and dizziness. PCCM was called to admit.  Her chest x-ray showed multilobar bronchopneumonia. Per EMS, patient was recently diagnosed with lung cancer, but declined treatment due to her age. She was seen by Dr. Oliva Bustard in 2013 but I do not see a follow-up lung biopsy or bronchoscopy. She has been treated for multiple upper respiratory tract infections and, COPD exacerbations   SUBJECTIVE: Improved. Titrated off BiPAP to 2 L nasal cannula. SPO2 stable at 97%  VITAL SIGNS: BP (!) 99/46   Pulse 63   Temp 97.7 F (36.5 C) (Oral)   Resp 17   Ht '5\' 2"'$  (1.575 m)   Wt 57.6 kg (126 lb 15.8 oz)   SpO2 97%   BMI 23.23 kg/m   HEMODYNAMICS:    VENTILATOR SETTINGS:    INTAKE / OUTPUT: No intake/output data recorded.  PHYSICAL EXAMINATION: General: Chronically ill-looking, older adult, in moderate distress respiratory distress Neuro: Alert to person and place, speech is normal, follows, moves, moves all extremities, no focal  deficits noted HEENT:  Is normocephalic and nontraumatic, PERRLA, EMS passages without drainage, oral mucosa moist and pink, trachea midline Cardiovascular: Rate and rhythm regular, S1, S2 audible, no murmur, regurg or gallop Lungs: Normal work of breathing, breath sounds bilaterally, no wheezing, significantly diminished breath sounds in the bases, scattered rhonchi Abdomen: Nondistended, soft, normal bowel sounds in Musculoskeletal:  No visible joint deformities, no swelling, positive range of motion in upper and lower extremities Extremities: +2 pulses, no edema Skin: Warm and dry with no rash  LABS:  BMET  Recent Labs Lab 10/28/16 2021 10/29/16 0504  NA 134* 136  K 5.2* 4.8  CL 101 104  CO2 21* 22  BUN 40* 48*  CREATININE 2.18* 1.95*  GLUCOSE 217* 108*    Electrolytes  Recent Labs Lab 10/28/16 2021 10/29/16 0504  CALCIUM 8.8* 8.1*  MG  --  2.2  PHOS  --  5.4*    CBC  Recent Labs Lab 10/28/16 2021 10/29/16 0504  WBC 11.0 5.9  HGB 12.1 9.8*  HCT 37.4 29.6*  PLT 225 158    Coag's No results for input(s): APTT, INR in the last 168 hours.  Sepsis Markers  Recent Labs Lab 10/28/16 2021 10/28/16 2219  LATICACIDVEN  --  1.7  PROCALCITON 0.21  --     ABG No results for input(s): PHART, PCO2ART, PO2ART in the last 168 hours.  Liver Enzymes  Recent Labs Lab 10/28/16 2021  AST 46*  ALT 17  ALKPHOS 70  BILITOT 0.4  ALBUMIN 3.8  Cardiac Enzymes  Recent Labs Lab 10/28/16 2021  TROPONINI 0.03*    Glucose No results for input(s): GLUCAP in the last 168 hours.  Imaging Dg Chest Port 1 View  Result Date: 10/29/2016 CLINICAL DATA:  83 year old female with history of pneumonia. EXAM: PORTABLE CHEST 1 VIEW COMPARISON:  Chest x-ray 10/28/2016. FINDINGS: Lung volumes are slightly low. Patchy ill-defined airspace opacities in the mid to lower lungs bilaterally appear slightly improved compared to the prior study. No pleural effusions. Crowding  of the pulmonary vasculature, without frank pulmonary edema. Mild cardiomegaly. The patient is rotated to the right on today's exam, resulting in distortion of the mediastinal contours and reduced diagnostic sensitivity and specificity for mediastinal pathology. Atherosclerosis in the thoracic aorta. IMPRESSION: 1. Cardiomegaly with pulmonary venous congestion, without frank pulmonary edema. 2. Patchy mid to lower lung ill-defined opacities slightly improved, favored to reflect resolving multilobar bronchopneumonia. 3. Aortic atherosclerosis. Electronically Signed   By: Vinnie Langton M.D.   On: 10/29/2016 07:58   Dg Chest Portable 1 View  Result Date: 10/28/2016 CLINICAL DATA:  Shortness of breath EXAM: PORTABLE CHEST 1 VIEW COMPARISON:  10/25/2016, 08/01/2016, 10/14/2015 FINDINGS: Mild diffuse coarse interstitial opacities are likely chronic. Slight increased opacity in the right mid lung and left base, atelectasis versus mild infiltrate. Stable enlarged cardiomediastinal silhouette without overt failure. Apical pleural scarring. No pneumothorax. Atherosclerosis of the aorta. IMPRESSION: 1. Slight increased right mid lung zone opacity with persistent vague opacity in the left mid lung zone and left base, findings could relate to multifocal infiltrates however nodules not excluded. Chest CT correlation was previously recommended. Electronically Signed   By: Donavan Foil M.D.   On: 10/28/2016 20:45    STUDIES:  2-D echo  CULTURES: Blood cultures 2. Urine culture  ANTIBIOTICS: Ceftriaxone 10/28/2016 Azithromycin 10/28/2016  SIGNIFICANT EVENTS: 10/28/2016: ED with dyspnea, pneumonia on chest x-ray  LINES/TUBES: Peripheral IVs  DISCUSSION: 83 year old female presenting with recurrent pneumonia, and COPD exacerbation  ASSESSMENT / PLAN:  PULMONARY A: Acute hypoxemic respiratory failure necessitating BiPAP Community-acquired pneumonia Acute COPD exacerbation Questionable lung  malignancy P:   Continuous BiPAP  and titrate off to nasal cannula as tolerated Nebulized bronchodilators and steroids Chest x-ray 1/16 Empiric antibiotics as above Aggressive pulmonary hygiene  CARDIOVASCULAR A:  Diastolic heart failure-questionable exacerbation, last echo was in 2012 History of hypertension History of hyperlipidemia P:  2-D echo Resume all home cardiac medications Hemodynamic monitoring per ICU protocol  RENAL A:   Acute on chronic renal failure P:   Gentle hydration Monitor and replace electrolytes  GASTROINTESTINAL A:   History of GERD P:   Keep nothing by mouth while on BiPAP. Protonix 40 mg by mouth daily  HEMATOLOGIC A:   Questionable lung malignancy P:  Palliative care consulted for establishment of care goals  INFECTIOUS A:   Recurrent pneumonia P:   Antibiotics as above. Follow-up cultures  ENDOCRINE A:   No acute issues   P:   Monitor blood glucose with a PMH  NEUROLOGIC A:   No acute issues P:   RASS goal:  Monitor for ICU induced delirium   FAMILY  - Updates: Family at bedside. We will updated when available  - Inter-disciplinary family meet or Palliative Care meeting due by:  day 7  Plan of care discussed with Dr. Ashby Dawes and [facility] physician Transfer care to Hospitalist starting 10/31/15. Spoke with Dr. Vianne Bulls at 8:45am  Magdalene [facility] ANP-BC Pulmonary and Critical Care Medicine [facility] Pager [phone] or [phone] 10/29/2016,  8:34 AM   Patient seen and examined with NP, above represents my findings, assessment, plan. 83 year old female presenting with recurrent pneumonia, and COPD exacerbation, currently she has scattered wheezing on lung auscultation. Chest x-ray imaging reviewed, and is consistent with emphysematous change. The patient has some confusion, likely consistent with acute delirium/metabolic encephalopathy. Continue supportive measures, the patient is noted to be DO NOT  RESUSCITATE.  Marda Stalker, M.D. 10/29/2016  Critical Care Attestation.  I have personally obtained a history, examined the patient, evaluated laboratory and imaging results, formulated the assessment and plan and placed orders. The Patient requires high complexity decision making for assessment and support, frequent evaluation and titration of therapies, application of advanced monitoring technologies and extensive interpretation of multiple databases. The patient has critical illness that could lead imminently to failure of 1 or more organ systems and requires the highest level of physician preparedness to intervene.  Critical Care Time devoted to patient care services described in this note is 35 minutes and is exclusive of time spent in procedures.

## 2016-10-29 NOTE — Progress Notes (Signed)
Report received from Merleen Nicely, RN. Pt brought up from Ed. Admitted to ICU. Will continue to monitor.

## 2016-10-29 NOTE — Progress Notes (Signed)
*  PRELIMINARY RESULTS* Echocardiogram 2D Echocardiogram has been performed.  Sherrie Sport 10/29/2016, 2:31 PM

## 2016-10-30 DIAGNOSIS — Z7189 Other specified counseling: Secondary | ICD-10-CM

## 2016-10-30 DIAGNOSIS — J189 Pneumonia, unspecified organism: Secondary | ICD-10-CM

## 2016-10-30 DIAGNOSIS — Z515 Encounter for palliative care: Secondary | ICD-10-CM

## 2016-10-30 LAB — BASIC METABOLIC PANEL
Anion gap: 9 (ref 5–15)
BUN: 45 mg/dL — ABNORMAL HIGH (ref 6–20)
CHLORIDE: 105 mmol/L (ref 101–111)
CO2: 24 mmol/L (ref 22–32)
Calcium: 8.1 mg/dL — ABNORMAL LOW (ref 8.9–10.3)
Creatinine, Ser: 1.87 mg/dL — ABNORMAL HIGH (ref 0.44–1.00)
GFR calc non Af Amer: 22 mL/min — ABNORMAL LOW (ref >60)
GFR, EST AFRICAN AMERICAN: 25 mL/min — AB (ref >60)
GLUCOSE: 104 mg/dL — AB (ref 65–99)
Potassium: 4.2 mmol/L (ref 3.5–5.1)
Sodium: 138 mmol/L (ref 135–145)

## 2016-10-30 LAB — CBC
HEMATOCRIT: 30.1 % — AB (ref 35.0–47.0)
HEMOGLOBIN: 9.9 g/dL — AB (ref 12.0–16.0)
MCH: 29.1 pg (ref 26.0–34.0)
MCHC: 32.9 g/dL (ref 32.0–36.0)
MCV: 88.5 fL (ref 80.0–100.0)
Platelets: 159 10*3/uL (ref 150–440)
RBC: 3.4 MIL/uL — ABNORMAL LOW (ref 3.80–5.20)
RDW: 14.6 % — ABNORMAL HIGH (ref 11.5–14.5)
WBC: 5.5 10*3/uL (ref 3.6–11.0)

## 2016-10-30 LAB — URINE CULTURE

## 2016-10-30 MED ORDER — GABAPENTIN 600 MG PO TABS
600.0000 mg | ORAL_TABLET | Freq: Every day | ORAL | Status: DC
  Administered 2016-10-31 – 2016-11-01 (×2): 600 mg via ORAL
  Filled 2016-10-30 (×3): qty 1

## 2016-10-30 MED ORDER — FUROSEMIDE 40 MG PO TABS
40.0000 mg | ORAL_TABLET | Freq: Two times a day (BID) | ORAL | Status: DC
  Administered 2016-10-30 – 2016-10-31 (×3): 40 mg via ORAL
  Filled 2016-10-30 (×3): qty 1

## 2016-10-30 MED ORDER — OXYBUTYNIN CHLORIDE 5 MG PO TABS
5.0000 mg | ORAL_TABLET | Freq: Two times a day (BID) | ORAL | Status: DC
  Administered 2016-10-30 – 2016-11-01 (×5): 5 mg via ORAL
  Filled 2016-10-30 (×5): qty 1

## 2016-10-30 MED ORDER — FUROSEMIDE 40 MG PO TABS
40.0000 mg | ORAL_TABLET | Freq: Once | ORAL | Status: AC
Start: 2016-10-30 — End: 2016-10-30
  Administered 2016-10-30: 40 mg via ORAL
  Filled 2016-10-30: qty 1

## 2016-10-30 NOTE — Clinical Social Work Note (Signed)
CSW consulted stating patient is a resident of [facility]. CSW has reviewed RN CM documentation and confirmed with Horris Latino at [facility] that patient is from their independent living and will not require anything at discharge. Shela Leff MSW,LCSW [phone]

## 2016-10-30 NOTE — Progress Notes (Signed)
[place] [facility] [place] NAME: Elizabeth Silva    MR#:  [id number]  DATE OF BIRTH:  27-Aug-1918  SUBJECTIVE:  CHIEF COMPLAINT:   Chief Complaint  Patient presents with  . Shortness of Breath   Continues to have shortness of breath and cough. Afebrile. On oxygen.  REVIEW OF SYSTEMS:    Review of Systems  Constitutional: Positive for malaise/fatigue. Negative for chills and fever.  HENT: Negative for sore throat.   Eyes: Negative for blurred vision, double vision and pain.  Respiratory: Positive for cough, sputum production, shortness of breath and wheezing. Negative for hemoptysis.   Cardiovascular: Negative for chest pain, palpitations, orthopnea and leg swelling.  Gastrointestinal: Negative for abdominal pain, constipation, diarrhea, heartburn, nausea and vomiting.  Genitourinary: Negative for dysuria and hematuria.  Musculoskeletal: Negative for back pain and joint pain.  Skin: Negative for rash.  Neurological: Positive for weakness. Negative for sensory change, speech change, focal weakness and headaches.  Endo/Heme/Allergies: Does not bruise/bleed easily.  Psychiatric/Behavioral: Negative for depression. The patient is not nervous/anxious.     DRUG ALLERGIES:   Allergies  Allergen Reactions  . Levofloxacin Other (See Comments)    REACTION: Burning in legs  . Penicillins Rash and Other (See Comments)    Patient unable to answer follow up questions at this time    VITALS:  Blood pressure 122/68, pulse 89, temperature 98 F (36.7 C), temperature source Oral, resp. rate 18, height '5\' 2"'$  (1.575 m), weight 59.9 kg (132 lb), SpO2 97 %.  PHYSICAL EXAMINATION:   Physical Exam  GENERAL:  83 y.o.-year-old patient lying in the bed with Conversational dyspnea EYES: Pupils equal, round, reactive to light and accommodation. No scleral icterus. Extraocular muscles intact.  HEENT: Head atraumatic, normocephalic. Oropharynx and nasopharynx clear.   NECK:  Supple, no jugular venous distention. No thyroid enlargement, no tenderness.  LUNGS: Bilateral wheezing and crackles. CARDIOVASCULAR: S1, S2 normal. No murmurs, rubs, or gallops.  ABDOMEN: Soft, nontender, nondistended. Bowel sounds present. No organomegaly or mass.  EXTREMITIES: No cyanosis, clubbing or edema b/l.    NEUROLOGIC: Cranial nerves II through XII are intact. No focal Motor or sensory deficits b/l.   PSYCHIATRIC: The patient is alert and Awake SKIN: No obvious rash, lesion, or ulcer.   LABORATORY PANEL:   CBC  Recent Labs Lab 10/30/16 0321  WBC 5.5  HGB 9.9*  HCT 30.1*  PLT 159   ------------------------------------------------------------------------------------------------------------------ Chemistries   Recent Labs Lab 10/28/16 2021 10/29/16 0504 10/30/16 0321  NA 134* 136 138  K 5.2* 4.8 4.2  CL 101 104 105  CO2 21* 22 24  GLUCOSE 217* 108* 104*  BUN 40* 48* 45*  CREATININE 2.18* 1.95* 1.87*  CALCIUM 8.8* 8.1* 8.1*  MG  --  2.2  --   AST 46*  --   --   ALT 17  --   --   ALKPHOS 70  --   --   BILITOT 0.4  --   --    ------------------------------------------------------------------------------------------------------------------  Cardiac Enzymes  Recent Labs Lab 10/28/16 2021  TROPONINI 0.03*   ------------------------------------------------------------------------------------------------------------------  RADIOLOGY:  Dg Chest Port 1 View  Result Date: 10/29/2016 CLINICAL DATA:  83 year old female with history of pneumonia. EXAM: PORTABLE CHEST 1 VIEW COMPARISON:  Chest x-ray 10/28/2016. FINDINGS: Lung volumes are slightly low. Patchy ill-defined airspace opacities in the mid to lower lungs bilaterally appear slightly improved compared to the prior study. No pleural effusions. Crowding of the pulmonary vasculature, without  frank pulmonary edema. Mild cardiomegaly. The patient is rotated to the right on today's exam, resulting in  distortion of the mediastinal contours and reduced diagnostic sensitivity and specificity for mediastinal pathology. Atherosclerosis in the thoracic aorta. IMPRESSION: 1. Cardiomegaly with pulmonary venous congestion, without frank pulmonary edema. 2. Patchy mid to lower lung ill-defined opacities slightly improved, favored to reflect resolving multilobar bronchopneumonia. 3. Aortic atherosclerosis. Electronically Signed   By: Vinnie Langton M.D.   On: 10/29/2016 07:58   Dg Chest Portable 1 View  Result Date: 10/28/2016 CLINICAL DATA:  Shortness of breath EXAM: PORTABLE CHEST 1 VIEW COMPARISON:  10/25/2016, 08/01/2016, 10/14/2015 FINDINGS: Mild diffuse coarse interstitial opacities are likely chronic. Slight increased opacity in the right mid lung and left base, atelectasis versus mild infiltrate. Stable enlarged cardiomediastinal silhouette without overt failure. Apical pleural scarring. No pneumothorax. Atherosclerosis of the aorta. IMPRESSION: 1. Slight increased right mid lung zone opacity with persistent vague opacity in the left mid lung zone and left base, findings could relate to multifocal infiltrates however nodules not excluded. Chest CT correlation was previously recommended. Electronically Signed   By: Donavan Foil M.D.   On: 10/28/2016 20:45     ASSESSMENT AND PLAN:   * Acute hypoxic respiratory failure due to multifocal pneumonia and acute on chronic systolic CHF Wean oxygen as tolerated. Nebulizers as needed.  * Multifocal pneumonia On IV antibiotics. Afebrile. Cultures no growth to date.  * Acute on chronic systolic CHF Start Lasix. Monitor input and output. Repeat BMP tomorrow. Ejection fraction 25-30%.  * CKD stage III is stable  * Anemia of chronic disease  All the records are reviewed and case discussed with Care Management/Social Workerr. Management plans discussed with the patient, family and they are in agreement.  CODE STATUS: FULL CODE  DVT Prophylaxis:  SCDs  TOTAL TIME TAKING CARE OF THIS PATIENT: 35 minutes.   POSSIBLE D/C IN 1-2 DAYS, DEPENDING ON CLINICAL CONDITION.  Hillary Bow R M.D on 10/30/2016 at 3:05 PM  Between 7am to 6pm - Pager - [phone]  After 6pm go to [email or web address] - password [email or web address] [facility]  S[employer]  Hospitalists  Office  [phone]  CC: Primary care physician; Eliezer Lofts, MD  Note: This dictation was prepared with Dragon dictation along with smaller phrase technology. Any transcriptional errors that result from this process are unintentional.

## 2016-10-30 NOTE — Care Management Important Message (Signed)
Important Message  Patient Details  Name: Elizabeth Silva MRN: [id number] Date of Birth: 11-Aug-1918   Medicare Important Message Given:  Yes    Beverly Sessions, RN 10/30/2016, 4:31 PM

## 2016-10-30 NOTE — Progress Notes (Signed)
Dr. Jannifer Franklin consulted for continuation of telemetry/cont. Pulse ox; acknowledged; will continue until transferred service to hospitalist; Barbaraann Faster, RN 12:08 AM 10/30/2016

## 2016-10-31 DIAGNOSIS — Z7189 Other specified counseling: Secondary | ICD-10-CM

## 2016-10-31 DIAGNOSIS — Z515 Encounter for palliative care: Secondary | ICD-10-CM

## 2016-10-31 LAB — BASIC METABOLIC PANEL
Anion gap: 10 (ref 5–15)
BUN: 45 mg/dL — AB (ref 6–20)
CO2: 28 mmol/L (ref 22–32)
CREATININE: 1.74 mg/dL — AB (ref 0.44–1.00)
Calcium: 8.7 mg/dL — ABNORMAL LOW (ref 8.9–10.3)
Chloride: 102 mmol/L (ref 101–111)
GFR calc Af Amer: 27 mL/min — ABNORMAL LOW (ref >60)
GFR calc non Af Amer: 24 mL/min — ABNORMAL LOW (ref >60)
GLUCOSE: 101 mg/dL — AB (ref 65–99)
Potassium: 4.1 mmol/L (ref 3.5–5.1)
SODIUM: 140 mmol/L (ref 135–145)

## 2016-10-31 MED ORDER — SENNOSIDES-DOCUSATE SODIUM 8.6-50 MG PO TABS
1.0000 | ORAL_TABLET | Freq: Two times a day (BID) | ORAL | Status: DC
  Administered 2016-10-31 – 2016-11-02 (×5): 1 via ORAL
  Filled 2016-10-31 (×5): qty 1

## 2016-10-31 NOTE — Consult Note (Signed)
Consultation Note Date: 10/31/2016   Patient Name: Elizabeth Silva  DOB: April 20, 1918  MRN: [id number]  Age / Sex: 83 y.o., female  PCP: Jinny Sanders, MD Referring Physician: Hillary Bow, MD  Reason for Consultation: Establishing goals of care d/t advanced age with likely lung cancer diagnosis and multilobar pneumonia.   HPI/Patient Profile: 83 y.o. female  with past medical history of COPD, diastolic congestive heart failure, hyperlipidemia, hypertension, chronic kidney disease stage 4, possible lung malignancy admitted from [facility] independent living on 10/28/2016 with shortness of breath and found to have multilobar pneumonia.   CT chest 11/07/2015: "Enlarging ground-glass nodular opacities in the left upper lobe and superior segment of the left lower lobe. In addition, or solid spiculated nodule in the left lower lobe has also enlarged significantly. The ground-glass opacities are somewhat concerning for low grade adenocarcinoma. The spiculated solid nodule in the left lower lobe also concerning for lung cancer. Small scattered mediastinal lymph nodes." Further notes show that communication was provided to family regarding CT results for possible low grade carcinoma and decided no further biopsy or eval. PCP encouraged family to share with patient but unclear from notes if CT results were shared with Elizabeth Silva.   Clinical Assessment and Goals of Care: I met today with Elizabeth Silva. No family at bedside. Very nice elderly lady. She tells me that she lives in independent living [facility] (unsure how many years she has been there now). She is happy there and says she is able to walk down to eat, feed and dress herself, manages her own medications, and participates in activities provided. Does chair exercises 4-5 days/week. She says she does "get winded" a little more than she used too but not bad and has never been a  problem. Wears no oxygen.   Elizabeth Silva tells me that she has been happy with her current functional status and is fairly independent but has help with laundry and cleaning. She tells me that her only concern is that "my health improves from this pneumonia." I asked her what she understands about her health issues and she tells me that she has "problems with my heart and kidneys" but did not tell me about any lung cancer. I asked specifically if she knew of any issues with her lungs besides the pneumonia and she tells me "no I hope not." I did not bring up past concern for lung cancer to her today.   We did discuss her thoughts on EOL. She shares that she is the only one left of her family and she has had much loss. I asked her about when her time comes about resuscitation. She struggles with this and tells me about her husband dying and that he had a DNR but she always thought that if he hadn't had DNR "that they could have done more for him." I reinforced the reality of CPR/resuscitation and that it is not very successful and encouraged her to consider this. She tells me that she will decided this "when the time  comes" and that she trusts her daughters to do the right thing for her.   She tells me about her 3 daughters - 2 of which are RNs except for her daughter Collie Siad who lives locally. When I asked if I could call her daughters to update them and see if they have any questions or concerns - "no, I do not want you to call them." Unable to explain why she did not wish for me to discuss with her daughters.   Primary Decision Maker PATIENT  I believe her HCPOA is Collie Siad from what she told me (I see electronic documents reflecting Collie Siad as POA but no documentation available to me regarding HCPOA)    [facility]   - Desire for resuscitation is unclear as she relies on her daughters but refuses to allow me to call them - If she does have a lung malignancy it appears to be slow growing and does not  seem to be effecting her QOL currently  Code Status/Advance Care Planning:  Full code   Symptom Management:   Cough: continue treatment for pneumonia per primary  Palliative Prophylaxis:   Aspiration and Delirium Protocol   Psycho-social/Spiritual:   Desire for further Chaplaincy support:no  Additional Recommendations: Caregiving  Support/Resources  Prognosis:   Unable to determine - obviously time likely limited given age and comorbitities. However, unless she declines further I do not see that she is hospice appropriate currently. Especially given her desire to pursue treatment and hope to have better health.   Discharge Planning: To Be Determined      Primary Diagnoses: Present on Admission: . Acute respiratory failure ([place])   I have reviewed the medical record, interviewed the patient and family, and examined the patient. The following aspects are pertinent.  Past Medical History:  Diagnosis Date  . Allergic rhinitis   . CKD (chronic kidney disease)   . COPD (chronic obstructive pulmonary disease) ([place])   . Diastolic CHF, acute (HCC)    echo (4/11) with EF 60-65%,mild LVH, mild aortic insufficiency, mild MR, PA systolic pressure 16-10 mmHg, mild to moderate TR  . Diverticulosis of colon   . GERD (gastroesophageal reflux disease)   . GI bleed   . H/O Clostridium difficile infection Sept. 2013  . History of PFTs   . Hyperlipidemia   . Hypertension    LE swelling with amlodipine, intolerant of ACEIs  . Impaired vision    right eye, was told due to HTN  . LBBB (left bundle branch block)   . Osteoarthritis    Social History   Social History  . Marital status: Widowed    Spouse name: N/A  . Number of children: N/A  . Years of education: N/A   Social History Main Topics  . Smoking status: Former Smoker    Packs/day: 1.00    Types: Cigarettes    Quit date: 10/15/1984  . Smokeless tobacco: Never Used  . Alcohol use 0.0 oz/week  . Drug use: No  .  Sexual activity: Not Asked   Other Topics Concern  . None   Social History Narrative   No living will, HCPOA: daughter Wilburn Mylar   Family History  Problem Relation Age of Onset  . Heart block Mother     complete  . Dementia Father   . Colon cancer Sister   . Dementia Sister   . Lung cancer Brother   . Cancer Brother     sinus  . Diabetes Brother    Scheduled  Meds: . amLODipine  5 mg Oral BID  . aspirin EC  81 mg Oral Daily  . azithromycin  500 mg Intravenous Q24H  . budesonide (PULMICORT) nebulizer solution  0.5 mg Nebulization BID  . cefTRIAXone  1 g Intravenous Q24H  . ferrous fumarate  1 tablet Oral Daily  . furosemide  40 mg Oral BID  . gabapentin  600 mg Oral QHS  . heparin  5,000 Units Subcutaneous Q8H  . ipratropium-albuterol  3 mL Nebulization Q6H  . oxybutynin  5 mg Oral BID  . pantoprazole  40 mg Oral Daily  . pravastatin  20 mg Oral q1800   Continuous Infusions: PRN Meds:.sodium chloride, acetaminophen, albuterol, ondansetron (ZOFRAN) IV, traMADol Allergies  Allergen Reactions  . Levofloxacin Other (See Comments)    REACTION: Burning in legs  . Penicillins Rash and Other (See Comments)    Patient unable to answer follow up questions at this time   Review of Systems  Constitutional: Positive for activity change and appetite change.  Neurological: Positive for weakness.    Physical Exam  Constitutional: She is oriented to person, place, and time. She appears well-developed.  HENT:  Head: Normocephalic and atraumatic.  Cardiovascular: Normal rate.   Pulmonary/Chest: Effort normal. No accessory muscle usage. No tachypnea. No respiratory distress.  Abdominal: Normal appearance.  Neurological: She is alert and oriented to person, place, and time.  Some memory impairment but seems very aware of her current situation  Nursing note and vitals reviewed.   Vital Signs: BP (!) 131/51 (BP Location: Right Arm)   Pulse 90   Temp 97.6 F (36.4 C)   Resp 19    Ht 5' 2"  (1.575 m)   Wt 59.4 kg (130 lb 14.4 oz)   SpO2 97%   BMI 23.94 kg/m  Pain Assessment: No/denies pain POSS *See Group Information*: 1-Acceptable,Awake and alert Pain Score: 0-No pain   SpO2: SpO2: 97 % O2 Device:SpO2: 97 % O2 Flow Rate: .O2 Flow Rate (L/min): 2 L/min  IO: Intake/output summary:  Intake/Output Summary (Last 24 hours) at 10/31/16 0940 Last data filed at 10/31/16 0344  Gross per 24 hour  Intake              520 ml  Output             2901 ml  Net            -2381 ml    LBM: Last BM Date: 10/28/16 Baseline Weight: Weight: 58 kg (127 lb 13.9 oz) Most recent weight: Weight: 59.4 kg (130 lb 14.4 oz)     Palliative Assessment/Data:   Flowsheet Rows   Flowsheet Row Most Recent Value  Intake Tab  Referral Department  Critical care  Unit at Time of Referral  ICU  Palliative Care Primary Diagnosis  Pulmonary  Date Notified  10/29/16  Palliative Care Type  New Palliative care  Reason for referral  Clarify Goals of Care  Date of Admission  10/28/16  # of days IP prior to Palliative referral  1  Clinical Assessment  Psychosocial & Spiritual Assessment  Palliative Care Outcomes      Time Total: 28mn Greater than 50%  of this time was spent counseling and coordinating care related to the above assessment and plan.  Signed by: AVinie Sill NP Palliative Medicine Team Pager # [phone](M-F 8a-5p) Team Phone # [phone](Nights/Weekends)

## 2016-10-31 NOTE — Evaluation (Signed)
Physical Therapy Evaluation Patient Details Name: Elizabeth Silva MRN: [id number] DOB: Jan 08, 1918 Today's Date: 10/31/2016   History of Present Illness  Pt is a 83 y.o. female presenting with SOB (per chart pt with recent lung CA diagnosis).  Pt admitted with acute hypoxemic respiratory failure, community acquired PNA, acute COPD exacerbation, and acute on chronic systolic CHF.  PMH includes CKD, COPD, CHF, htn, R eye impaired vision.  Clinical Impression  Prior to hospital admission, per chart pt was ambulating with rollator.  Per chart pt lives at [facility] (Independent Living).  Pt appearing to be a poor historian and only oriented to self during session.  Currently pt is SBA supine to sit, CGA to min assist with transfers, and CGA ambulating 5 feet with RW.  Pt fatigued with standing to perform hygiene and donning depends (2 separate stands required to perform these tasks d/t pt fatiguing and needing to sit down).  Distance ambulated and session activities all limited d/t pt fatigue.  Pt would benefit from skilled PT to address noted impairments and functional limitations.  Recommend pt discharge to STR when medically appropriate.    Follow Up Recommendations SNF    Equipment Recommendations  Rolling walker with 5" wheels    Recommendations for Other Services       Precautions / Restrictions Precautions Precautions: Fall Restrictions Weight Bearing Restrictions: No      Mobility  Bed Mobility Overal bed mobility: Needs Assistance Bed Mobility: Supine to Sit     Supine to sit: Supervision;HOB elevated     General bed mobility comments: Increased effort and time to perform on own  Transfers Overall transfer level: Needs assistance Equipment used: Rolling walker (2 wheeled) Transfers: Sit to/from Stand Sit to Stand: Min guard;Min assist  Transfer to commode: CGA       General transfer comment: min assist to stand from bed; CGA to stand from bedside commode  and recliner; vc's for hand placement  Ambulation/Gait Ambulation/Gait assistance: Min guard Ambulation Distance (Feet): 5 Feet Assistive device: Rolling walker (2 wheeled)   Gait velocity: decreased   General Gait Details: decreased B step length/foot clearance/heelstrike; limited distance d/t fatigue and pt requesting to sit in chair  Stairs            Wheelchair Mobility    Modified Rankin (Stroke Patients Only)       Balance Overall balance assessment: Needs assistance Sitting-balance support: Bilateral upper extremity supported;Feet supported Sitting balance-Leahy Scale: Fair     Standing balance support: Single extremity supported (on RW) Standing balance-Leahy Scale: Fair Standing balance comment: standing for hygiene after toileting                             Pertinent Vitals/Pain Pain Assessment: No/denies pain  Vitals (HR and O2 on 2 L O2 via nasal cannula) stable and WFL throughout treatment session.    Home Living Family/patient expects to be discharged to:: Other (Comment)                 Additional Comments: [facility] [place] [facility]    Prior Function Level of Independence: Independent with assistive device(s)         Comments: Pt uses rollator and per chart is independent with bathing and feeding.     Hand Dominance        Extremity/Trunk Assessment   Upper Extremity Assessment Upper Extremity Assessment: Generalized weakness    Lower Extremity  Assessment Lower Extremity Assessment: Generalized weakness       Communication   Communication: HOH  Cognition Arousal/Alertness: Awake/alert Behavior During Therapy: WFL for tasks assessed/performed Overall Cognitive Status: No family/caregiver present to determine baseline cognitive functioning (Oriented to person but not place or time or situation)                      General Comments   Nursing cleared pt for participation in physical  therapy.  Pt agreeable to PT session.    Exercises     Assessment/Plan    PT Assessment Patient needs continued PT services  PT Problem List Decreased strength;Decreased activity tolerance;Decreased balance;Decreased mobility          PT Treatment Interventions DME instruction;Gait training;Functional mobility training;Therapeutic activities;Therapeutic exercise;Balance training;Patient/family education    PT Goals (Current goals can be found in the Care Plan section)  Acute Rehab PT Goals Patient Stated Goal: to get stronger PT Goal Formulation: With patient Time For Goal Achievement: 11/14/16 Potential to Achieve Goals: Good    Frequency Min 2X/week   Barriers to discharge Decreased caregiver support      Co-evaluation               End of Session Equipment Utilized During Treatment: Gait belt;Oxygen (2 L O2 via nasal cannula) Activity Tolerance: Patient limited by fatigue Patient left: in chair;with call bell/phone within reach;with chair alarm set Nurse Communication: Mobility status;Precautions         Time: 6861-6837 PT Time Calculation (min) (ACUTE ONLY): 33 min   Charges:   PT Evaluation $PT Eval Low Complexity: 1 Procedure PT Treatments $Therapeutic Activity: 8-22 mins   PT G CodesLeitha Bleak 11-30-2016, 2:14 PM Leitha Bleak, [place]

## 2016-10-31 NOTE — Progress Notes (Signed)
[place] at [place] NAME: Elizabeth Silva    MR#:  [id number]  DATE OF BIRTH:  1918/09/24  SUBJECTIVE:  CHIEF COMPLAINT:   Chief Complaint  Patient presents with  . Shortness of Breath   On 2 L oxygen. Shortness of breath still present. Coughing. No sputum.  REVIEW OF SYSTEMS:    Review of Systems  Constitutional: Positive for malaise/fatigue. Negative for chills and fever.  HENT: Negative for sore throat.   Eyes: Negative for blurred vision, double vision and pain.  Respiratory: Positive for cough, sputum production, shortness of breath and wheezing. Negative for hemoptysis.   Cardiovascular: Negative for chest pain, palpitations, orthopnea and leg swelling.  Gastrointestinal: Negative for abdominal pain, constipation, diarrhea, heartburn, nausea and vomiting.  Genitourinary: Negative for dysuria and hematuria.  Musculoskeletal: Negative for back pain and joint pain.  Skin: Negative for rash.  Neurological: Positive for weakness. Negative for sensory change, speech change, focal weakness and headaches.  Endo/Heme/Allergies: Does not bruise/bleed easily.  Psychiatric/Behavioral: Negative for depression. The patient is not nervous/anxious.     DRUG ALLERGIES:   Allergies  Allergen Reactions  . Levofloxacin Other (See Comments)    REACTION: Burning in legs  . Penicillins Rash and Other (See Comments)    Patient unable to answer follow up questions at this time    VITALS:  Blood pressure (!) 131/51, pulse 90, temperature 97.6 F (36.4 C), resp. rate 19, height '5\' 2"'$  (1.575 m), weight 59.4 kg (130 lb 14.4 oz), SpO2 97 %.  PHYSICAL EXAMINATION:   Physical Exam  GENERAL:  83 y.o.-year-old patient lying in the bed with Conversational dyspnea EYES: Pupils equal, round, reactive to light and accommodation. No scleral icterus. Extraocular muscles intact.  HEENT: Head atraumatic, normocephalic. Oropharynx and nasopharynx clear.  NECK:   Supple, no jugular venous distention. No thyroid enlargement, no tenderness.  LUNGS: Bilateral wheezing and crackles. CARDIOVASCULAR: S1, S2 normal. No murmurs, rubs, or gallops.  ABDOMEN: Soft, nontender, nondistended. Bowel sounds present. No organomegaly or mass.  EXTREMITIES: No cyanosis, clubbing or edema b/l.    NEUROLOGIC: Cranial nerves II through XII are intact. No focal Motor or sensory deficits b/l.   PSYCHIATRIC: The patient is alert and Awake SKIN: No obvious rash, lesion, or ulcer.   LABORATORY PANEL:   CBC  Recent Labs Lab 10/30/16 0321  WBC 5.5  HGB 9.9*  HCT 30.1*  PLT 159   ------------------------------------------------------------------------------------------------------------------ Chemistries   Recent Labs Lab 10/28/16 2021 10/29/16 0504  10/31/16 0501  NA 134* 136  < > 140  K 5.2* 4.8  < > 4.1  CL 101 104  < > 102  CO2 21* 22  < > 28  GLUCOSE 217* 108*  < > 101*  BUN 40* 48*  < > 45*  CREATININE 2.18* 1.95*  < > 1.74*  CALCIUM 8.8* 8.1*  < > 8.7*  MG  --  2.2  --   --   AST 46*  --   --   --   ALT 17  --   --   --   ALKPHOS 70  --   --   --   BILITOT 0.4  --   --   --   < > = values in this interval not displayed. ------------------------------------------------------------------------------------------------------------------  Cardiac Enzymes  Recent Labs Lab 10/28/16 2021  TROPONINI 0.03*   ------------------------------------------------------------------------------------------------------------------  RADIOLOGY:  No results found.   ASSESSMENT AND PLAN:   * Acute hypoxic  respiratory failure due to multifocal pneumonia and acute on chronic systolic CHF Wean oxygen as tolerated. Nebulizers as needed.  * Multifocal pneumonia On IV antibiotics. Afebrile. Cultures no growth to date.  * Acute on chronic systolic CHF Continue Lasix  Monitor input and output.  BMP tomorrow AM Ejection fraction 25-30%.  * CKD stage III is  stable  * Anemia of chronic disease  All the records are reviewed and case discussed with Care Management/Social Workerr. Management plans discussed with the patient, family and they are in agreement.  CODE STATUS: FULL CODE  DVT Prophylaxis: SCDs  TOTAL TIME TAKING CARE OF THIS PATIENT: 35 minutes.   POSSIBLE D/C IN 1-2 DAYS, DEPENDING ON CLINICAL CONDITION.  Hillary Bow R M.D on 10/31/2016 at 2:20 PM  Between 7am to 6pm - Pager - [phone]  After 6pm go to [email or web address] - password [email or web address] [facility]  [facility]  Office  [phone]  CC: Primary care physician; Eliezer Lofts, MD  Note: This dictation was prepared with Dragon dictation along with smaller phrase technology. Any transcriptional errors that result from this process are unintentional.

## 2016-11-01 LAB — BASIC METABOLIC PANEL
Anion gap: 10 (ref 5–15)
BUN: 55 mg/dL — ABNORMAL HIGH (ref 6–20)
CHLORIDE: 100 mmol/L — AB (ref 101–111)
CO2: 30 mmol/L (ref 22–32)
CREATININE: 2.02 mg/dL — AB (ref 0.44–1.00)
Calcium: 8.5 mg/dL — ABNORMAL LOW (ref 8.9–10.3)
GFR calc non Af Amer: 20 mL/min — ABNORMAL LOW (ref >60)
GFR, EST AFRICAN AMERICAN: 23 mL/min — AB (ref >60)
Glucose, Bld: 113 mg/dL — ABNORMAL HIGH (ref 65–99)
POTASSIUM: 3.9 mmol/L (ref 3.5–5.1)
SODIUM: 140 mmol/L (ref 135–145)

## 2016-11-01 MED ORDER — FUROSEMIDE 20 MG PO TABS
20.0000 mg | ORAL_TABLET | Freq: Every day | ORAL | Status: DC
  Administered 2016-11-02: 20 mg via ORAL
  Filled 2016-11-01: qty 1

## 2016-11-01 MED ORDER — PREDNISONE 50 MG PO TABS
50.0000 mg | ORAL_TABLET | Freq: Every day | ORAL | Status: DC
  Administered 2016-11-01 – 2016-11-02 (×2): 50 mg via ORAL
  Filled 2016-11-01 (×2): qty 1

## 2016-11-01 NOTE — Progress Notes (Signed)
[place] at [place] NAME: Elizabeth Silva    MR#:  [id number]  DATE OF BIRTH:  [birth year]  SUBJECTIVE:  CHIEF COMPLAINT:   Chief Complaint  Patient presents with  . Shortness of Breath    Shortness of breath still present.  Clear sputum Weak REVIEW OF SYSTEMS:    Review of Systems  Constitutional: Positive for malaise/fatigue. Negative for chills and fever.  HENT: Negative for sore throat.   Eyes: Negative for blurred vision, double vision and pain.  Respiratory: Positive for cough, sputum production, shortness of breath and wheezing. Negative for hemoptysis.   Cardiovascular: Negative for chest pain, palpitations, orthopnea and leg swelling.  Gastrointestinal: Negative for abdominal pain, constipation, diarrhea, heartburn, nausea and vomiting.  Genitourinary: Negative for dysuria and hematuria.  Musculoskeletal: Negative for back pain and joint pain.  Skin: Negative for rash.  Neurological: Positive for weakness. Negative for sensory change, speech change, focal weakness and headaches.  Endo/Heme/Allergies: Does not bruise/bleed easily.  Psychiatric/Behavioral: Negative for depression. The patient is not nervous/anxious.     DRUG ALLERGIES:   Allergies  Allergen Reactions  . Levofloxacin Other (See Comments)    REACTION: Burning in legs  . Penicillins Rash and Other (See Comments)    Patient unable to answer follow up questions at this time    VITALS:  Blood pressure (!) 114/52, pulse 80, temperature 97.9 F (36.6 C), temperature source Oral, resp. rate 16, height '5\' 2"'$  (1.575 m), weight 53 kg (116 lb 12.8 oz), SpO2 96 %.  PHYSICAL EXAMINATION:   Physical Exam  GENERAL:  83 y.o.-year-old patient lying in the bed with Conversational dyspnea EYES: Pupils equal, round, reactive to light and accommodation. No scleral icterus. Extraocular muscles intact.  HEENT: Head atraumatic, normocephalic. Oropharynx and nasopharynx clear.   NECK:  Supple, no jugular venous distention. No thyroid enlargement, no tenderness.  LUNGS: Bilateral wheezing and crackles. CARDIOVASCULAR: S1, S2 normal. No murmurs, rubs, or gallops.  ABDOMEN: Soft, nontender, nondistended. Bowel sounds present. No organomegaly or mass.  EXTREMITIES: No cyanosis, clubbing or edema b/l.    NEUROLOGIC: Cranial nerves II through XII are intact. No focal Motor or sensory deficits b/l.   PSYCHIATRIC: The patient is alert and Awake SKIN: No obvious rash, lesion, or ulcer.   LABORATORY PANEL:   CBC  Recent Labs Lab 10/30/16 0321  WBC 5.5  HGB 9.9*  HCT 30.1*  PLT 159   ------------------------------------------------------------------------------------------------------------------ Chemistries   Recent Labs Lab 10/28/16 2021 10/29/16 0504  11/01/16 0351  NA 134* 136  < > 140  K 5.2* 4.8  < > 3.9  CL 101 104  < > 100*  CO2 21* 22  < > 30  GLUCOSE 217* 108*  < > 113*  BUN 40* 48*  < > 55*  CREATININE 2.18* 1.95*  < > 2.02*  CALCIUM 8.8* 8.1*  < > 8.5*  MG  --  2.2  --   --   AST 46*  --   --   --   ALT 17  --   --   --   ALKPHOS 70  --   --   --   BILITOT 0.4  --   --   --   < > = values in this interval not displayed. ------------------------------------------------------------------------------------------------------------------  Cardiac Enzymes  Recent Labs Lab 10/28/16 2021  TROPONINI 0.03*   ------------------------------------------------------------------------------------------------------------------  RADIOLOGY:  No results found.   ASSESSMENT AND PLAN:   * Acute hypoxic  respiratory failure due to multifocal pneumonia and acute on chronic systolic CHF Wean oxygen as tolerated. Nebulizers as needed.  * Multifocal pneumonia On IV antibiotics. Afebrile. Cultures no growth to date.  * Acute on chronic systolic CHF Continue Lasix at lower dose due to increasing BUN/Cr  Monitor input and output.  BMP tomorrow  AM Ejection fraction 25-30%.  * CKD stage III is stable  * Anemia of chronic disease  All the records are reviewed and case discussed with Care Management/Social Workerr. Management plans discussed with the patient, family and they are in agreement.  CODE STATUS: FULL CODE  DVT Prophylaxis: SCDs  TOTAL TIME TAKING CARE OF THIS PATIENT: 35 minutes.   Discharge tomorrow if Bed available at [facility]   Hillary Bow R M.D on 11/01/2016 at 1:40 PM  Between 7am to 6pm - Pager - [phone]  After 6pm go to [email or web address] - password [email or web address] [facility]  [facility] [place] [facility]  Office  [phone]  CC: Primary care physician; Eliezer Lofts, MD  Note: This dictation was prepared with Dragon dictation along with smaller phrase technology. Any transcriptional errors that result from this process are unintentional.

## 2016-11-01 NOTE — NC FL2 (Signed)
[place] LEVEL OF CARE SCREENING TOOL     IDENTIFICATION  Patient Name: Elizabeth Silva Birthdate: [birth year] Sex: female Admission Date (Current Location): 10/28/2016  [facility] and [place] Number:  Engineering geologist and Address:  [facility], [address], [place], [place] [address]      Provider Number: [id number]  Attending Physician Name and Address:  Hillary Bow, MD  Relative Name and Phone Number:       Current Level of Care: Hospital Recommended Level of Care: [place] Prior Approval Number:    Date Approved/Denied:   PASRR Number:    Discharge Plan: SNF    Current Diagnoses: Patient Active Problem List   Diagnosis Date Noted  . Goals of care, counseling/discussion   . Palliative care encounter   . Acute respiratory failure ([place]) 10/28/2016  . Cellulitis of leg, left 06/08/2016  . Acute renal failure ([place]) 06/08/2016  . Leg hematoma 05/30/2016  . Upper GI bleed 04/26/2016  . Multiple pulmonary nodules 04/12/2016  . Chronic constipation 04/12/2016  . Melena 01/12/2016  . Abnormal chest x-ray 01/12/2016  . Advance directive discussed with patient 01/10/2016  . Community acquired pneumonia 10/07/2015  . Vision loss, bilateral 10/26/2014  . Carotid stenosis 10/26/2014  . Bilateral tinnitus 04/20/2014  . Prediabetes 01/07/2014  . Bilateral knee pain 01/07/2014  . Hip pain, left 01/07/2014  . COPD exacerbation ([place]) 10/14/2013  . Fatigue 08/28/2013  . Insomnia 01/02/2013  . Anemia of chronic renal failure 12/25/2010  . CONSTIPATION 12/08/2010  . DIASTOLIC HEART FAILURE, CHRONIC 03/02/2010  . CAROTID BRUIT, RIGHT 03/02/2010  . INCONTINENCE, URGE 01/25/2010  . PERIPHERAL NEUROPATHY, IDIOPATHIC 05/10/2008  . DYSURIA, CHRONIC 02/27/2008  . PERIPHERAL VASCULAR DISEASE 05/05/2007  . Chronic venous insufficiency 05/05/2007  . ALLERGIC RHINITIS 05/05/2007  . COPD, mild ([place]) 05/05/2007  . GERD  05/05/2007  . DIVERTICULOSIS, COLON 05/05/2007  . Osteoarthritis, multiple sites 05/05/2007  . DEGENERATION, CERVICAL DISC 05/05/2007  . STRESS INCONTINENCE 05/05/2007  . HYPERCHOLESTEROLEMIA, PURE 04/07/2007  . RENAL STONE 04/07/2007  . Essential hypertension, benign 03/17/2007  . Chronic kidney disease, stage 4, severely decreased GFR ([place]) 03/17/2007  . CARDIAC MURMUR 03/17/2007    Orientation RESPIRATION BLADDER Height & Weight     Self, Situation, Place  Normal, O2 (2 liters) Incontinent Weight: 116 lb 12.8 oz (53 kg) Height:  '5\' 2"'$  (157.5 cm)  BEHAVIORAL SYMPTOMS/MOOD NEUROLOGICAL BOWEL NUTRITION STATUS   (none)  (none) Incontinent Diet (carb modified)  AMBULATORY STATUS COMMUNICATION OF NEEDS Skin   Limited Assist Verbally Normal                       Personal Care Assistance Level of Assistance  Bathing, Dressing Bathing Assistance: Limited assistance   Dressing Assistance: Limited assistance     Functional Limitations Info  Hearing, Sight Sight Info: Impaired Hearing Info: Impaired      SPECIAL CARE FACTORS FREQUENCY  PT (By licensed PT)                    Contractures Contractures Info: Not present    Additional Factors Info  Code Status Code Status Info: full             Current Medications (11/01/2016):  This is the current hospital active medication list Current Facility-Administered Medications  Medication Dose Route Frequency Provider Last Rate Last Dose  . 0.9 %  sodium chloride infusion  250 mL Intravenous PRN Magadalene  Abran Duke, NP      . acetaminophen (TYLENOL) tablet 650 mg  650 mg Oral Q4H PRN Mikael Spray, NP      . albuterol (PROVENTIL) (2.5 MG/3ML) 0.083% nebulizer solution 2.5 mg  2.5 mg Nebulization Q2H PRN Mikael Spray, NP      . amLODipine (NORVASC) tablet 5 mg  5 mg Oral BID Mikael Spray, NP   5 mg at 11/01/16 1134  . aspirin EC tablet 81 mg  81 mg Oral Daily Mikael Spray, NP   81 mg at 11/01/16  1133  . azithromycin (ZITHROMAX) 500 mg in dextrose 5 % 250 mL IVPB  500 mg Intravenous Q24H Laverle Hobby, MD   500 mg at 10/31/16 1759  . budesonide (PULMICORT) nebulizer solution 0.5 mg  0.5 mg Nebulization BID Mikael Spray, NP   0.5 mg at 11/01/16 0813  . cefTRIAXone (ROCEPHIN) IVPB 1 g  1 g Intravenous Q24H Harvest Dark, MD   1 g at 10/31/16 2023  . ferrous fumarate (HEMOCYTE - 106 mg FE) tablet 106 mg of iron  1 tablet Oral Daily Mikael Spray, NP   106 mg of iron at 11/01/16 1133  . [START ON 11/02/2016] furosemide (LASIX) tablet 20 mg  20 mg Oral Daily Srikar Sudini, MD      . gabapentin (NEURONTIN) tablet 600 mg  600 mg Oral QHS Srikar Sudini, MD   600 mg at 10/31/16 2236  . heparin injection 5,000 Units  5,000 Units Subcutaneous Q8H Mikael Spray, NP   5,000 Units at 11/01/16 0640  . ipratropium-albuterol (DUONEB) 0.5-2.5 (3) MG/3ML nebulizer solution 3 mL  3 mL Nebulization Q6H Mikael Spray, NP   3 mL at 11/01/16 0813  . ondansetron (ZOFRAN) injection 4 mg  4 mg Intravenous Q6H PRN Mikael Spray, NP      . oxybutynin (DITROPAN) tablet 5 mg  5 mg Oral BID Hillary Bow, MD   5 mg at 11/01/16 1133  . pantoprazole (PROTONIX) EC tablet 40 mg  40 mg Oral Daily Mikael Spray, NP   40 mg at 11/01/16 1134  . pravastatin (PRAVACHOL) tablet 20 mg  20 mg Oral q1800 Mikael Spray, NP   20 mg at 10/31/16 1759  . predniSONE (DELTASONE) tablet 50 mg  50 mg Oral Q breakfast Hillary Bow, MD   50 mg at 11/01/16 1243  . senna-docusate (Senokot-S) tablet 1 tablet  1 tablet Oral BID Hillary Bow, MD   1 tablet at 11/01/16 1133  . traMADol (ULTRAM) tablet 50 mg  50 mg Oral Q8H PRN Mikael Spray, NP         Discharge Medications: Please see discharge summary for a list of discharge medications.  Relevant Imaging Results:  Relevant Lab Results:   Additional Information ss: [id number]  Shela Leff, LCSW

## 2016-11-01 NOTE — Clinical Social Work Note (Signed)
Clinical Social Work Assessment  Patient Details  Name: Elizabeth Silva MRN: [id number] Date of Birth: 1917/12/13  Date of referral:  11/01/16               Reason for consult:  Facility Placement                Permission sought to share information with:    Permission granted to share information::     Name::        Agency::     Relationship::     Contact Information:     Housing/Transportation Living arrangements for the past 2 months:  [place] of Information:  Adult Children Patient Interpreter Needed:  None Criminal Activity/Legal Involvement Pertinent to Current Situation/Hospitalization:  No - Comment as needed Significant Relationships:  Adult Children, Other Family Members Lives with:  Self Do you feel safe going back to the place where you live?    Need for family participation in patient care:  Yes (Comment)  Care giving concerns:  Patient resides at [facility] Worker assessment / plan:  CSW attempted to meet with patient but patient was sleeping soundly and her grandson and granddaughter were visiting. They informed me that the main point of contact for patient was her daughter: Sharon Seller: [phone]. CSW contacted Ms. Hepler to discuss the PT recommendations for STR. Ms. Aron Baba stated that she would be in agreement with STR and that they prefer [facility] if possible. CSW to initiate a bedsearch.  Employment status:  Retired Nurse, adult PT Recommendations:  [facility] / Referral to community resources:  [place]  Patient/Family's Response to care:  Patient's daughter expressed appreciation for CSW assistance.  Patient/Family's Understanding of and Emotional Response to Diagnosis, Current Treatment, and Prognosis:  Patient's daughter was receptive to [facility] phone call and understands that her mom is weaker than she has been at baseline.    Emotional Assessment Appearance:  Appears stated age Attitude/Demeanor/Rapport:   (patient sleeping ) Affect (typically observed):  Unable to Assess Orientation:  Oriented to Self, Oriented to Place Alcohol / Substance use:  Not Applicable Psych involvement (Current and /or in the community):  No (Comment)  Discharge Needs  Concerns to be addressed:  Care Coordination Readmission within the last 30 days:  No Current discharge risk:  None Barriers to Discharge:  No Barriers Identified   Shela Leff, LCSW 11/01/2016, 1:39 PM

## 2016-11-02 ENCOUNTER — Encounter
Admission: RE | Admit: 2016-11-02 | Discharge: 2016-11-02 | Disposition: A | Discharge status: Home / Self Care | Payer: Medicare Other | Source: Ambulatory Visit | Attending: Internal Medicine | Admitting: Internal Medicine

## 2016-11-02 DIAGNOSIS — R509 Fever, unspecified: Secondary | ICD-10-CM | POA: Diagnosis not present

## 2016-11-02 DIAGNOSIS — Z9981 Dependence on supplemental oxygen: Secondary | ICD-10-CM | POA: Diagnosis not present

## 2016-11-02 DIAGNOSIS — R05 Cough: Secondary | ICD-10-CM | POA: Diagnosis not present

## 2016-11-02 DIAGNOSIS — R531 Weakness: Secondary | ICD-10-CM | POA: Diagnosis not present

## 2016-11-02 DIAGNOSIS — M79642 Pain in left hand: Secondary | ICD-10-CM | POA: Diagnosis not present

## 2016-11-02 DIAGNOSIS — R5381 Other malaise: Secondary | ICD-10-CM | POA: Insufficient documentation

## 2016-11-02 DIAGNOSIS — B370 Candidal stomatitis: Secondary | ICD-10-CM | POA: Diagnosis not present

## 2016-11-02 DIAGNOSIS — J189 Pneumonia, unspecified organism: Secondary | ICD-10-CM | POA: Diagnosis not present

## 2016-11-02 DIAGNOSIS — R1312 Dysphagia, oropharyngeal phase: Secondary | ICD-10-CM | POA: Diagnosis not present

## 2016-11-02 DIAGNOSIS — J101 Influenza due to other identified influenza virus with other respiratory manifestations: Secondary | ICD-10-CM | POA: Diagnosis not present

## 2016-11-02 DIAGNOSIS — Z7951 Long term (current) use of inhaled steroids: Secondary | ICD-10-CM | POA: Diagnosis not present

## 2016-11-02 DIAGNOSIS — R300 Dysuria: Secondary | ICD-10-CM | POA: Diagnosis not present

## 2016-11-02 DIAGNOSIS — H547 Unspecified visual loss: Secondary | ICD-10-CM | POA: Diagnosis not present

## 2016-11-02 DIAGNOSIS — J9600 Acute respiratory failure, unspecified whether with hypoxia or hypercapnia: Secondary | ICD-10-CM | POA: Diagnosis not present

## 2016-11-02 DIAGNOSIS — I10 Essential (primary) hypertension: Secondary | ICD-10-CM | POA: Diagnosis not present

## 2016-11-02 DIAGNOSIS — M109 Gout, unspecified: Secondary | ICD-10-CM | POA: Diagnosis not present

## 2016-11-02 DIAGNOSIS — R6889 Other general symptoms and signs: Secondary | ICD-10-CM | POA: Diagnosis not present

## 2016-11-02 DIAGNOSIS — I130 Hypertensive heart and chronic kidney disease with heart failure and stage 1 through stage 4 chronic kidney disease, or unspecified chronic kidney disease: Secondary | ICD-10-CM | POA: Diagnosis not present

## 2016-11-02 DIAGNOSIS — I5022 Chronic systolic (congestive) heart failure: Secondary | ICD-10-CM | POA: Diagnosis present

## 2016-11-02 DIAGNOSIS — I509 Heart failure, unspecified: Secondary | ICD-10-CM | POA: Diagnosis not present

## 2016-11-02 DIAGNOSIS — R1310 Dysphagia, unspecified: Secondary | ICD-10-CM | POA: Diagnosis not present

## 2016-11-02 DIAGNOSIS — E785 Hyperlipidemia, unspecified: Secondary | ICD-10-CM | POA: Diagnosis not present

## 2016-11-02 DIAGNOSIS — G629 Polyneuropathy, unspecified: Secondary | ICD-10-CM | POA: Diagnosis not present

## 2016-11-02 DIAGNOSIS — J441 Chronic obstructive pulmonary disease with (acute) exacerbation: Secondary | ICD-10-CM | POA: Diagnosis not present

## 2016-11-02 DIAGNOSIS — K219 Gastro-esophageal reflux disease without esophagitis: Secondary | ICD-10-CM | POA: Diagnosis not present

## 2016-11-02 DIAGNOSIS — J309 Allergic rhinitis, unspecified: Secondary | ICD-10-CM | POA: Diagnosis not present

## 2016-11-02 DIAGNOSIS — M6281 Muscle weakness (generalized): Secondary | ICD-10-CM | POA: Diagnosis not present

## 2016-11-02 DIAGNOSIS — M7989 Other specified soft tissue disorders: Secondary | ICD-10-CM | POA: Diagnosis not present

## 2016-11-02 DIAGNOSIS — D631 Anemia in chronic kidney disease: Secondary | ICD-10-CM | POA: Diagnosis not present

## 2016-11-02 DIAGNOSIS — Z7982 Long term (current) use of aspirin: Secondary | ICD-10-CM | POA: Diagnosis not present

## 2016-11-02 DIAGNOSIS — Z7401 Bed confinement status: Secondary | ICD-10-CM | POA: Diagnosis not present

## 2016-11-02 DIAGNOSIS — I447 Left bundle-branch block, unspecified: Secondary | ICD-10-CM | POA: Diagnosis not present

## 2016-11-02 DIAGNOSIS — R2689 Other abnormalities of gait and mobility: Secondary | ICD-10-CM | POA: Diagnosis not present

## 2016-11-02 DIAGNOSIS — R0989 Other specified symptoms and signs involving the circulatory and respiratory systems: Secondary | ICD-10-CM | POA: Diagnosis not present

## 2016-11-02 DIAGNOSIS — N183 Chronic kidney disease, stage 3 (moderate): Secondary | ICD-10-CM | POA: Diagnosis not present

## 2016-11-02 DIAGNOSIS — R633 Feeding difficulties: Secondary | ICD-10-CM | POA: Diagnosis not present

## 2016-11-02 DIAGNOSIS — M25532 Pain in left wrist: Secondary | ICD-10-CM | POA: Diagnosis not present

## 2016-11-02 LAB — CULTURE, BLOOD (ROUTINE X 2)
Culture: NO GROWTH
Culture: NO GROWTH

## 2016-11-02 MED ORDER — PREDNISONE 50 MG PO TABS: 50.0000 mg | ORAL_TABLET | Freq: Every day | ORAL | 0 refills | Status: DC

## 2016-11-02 MED ORDER — TRAMADOL HCL 50 MG PO TABS: 50.0000 mg | ORAL_TABLET | Freq: Three times a day (TID) | ORAL | 0 refills | Status: DC | PRN

## 2016-11-02 MED ORDER — FUROSEMIDE 20 MG PO TABS: 20.0000 mg | ORAL_TABLET | ORAL | Status: DC

## 2016-11-02 MED ORDER — AZITHROMYCIN 500 MG PO TABS: 500.0000 mg | ORAL_TABLET | Freq: Every day | ORAL | 0 refills | Status: DC

## 2016-11-02 NOTE — Discharge Summary (Signed)
[facility] [place] NAME: Elizabeth Silva    MR#:  [id number]  DATE OF BIRTH:  1918/08/08  DATE OF ADMISSION:  10/28/2016 ADMITTING PHYSICIAN: Wilhelmina Mcardle, MD  DATE OF DISCHARGE: 11/03/2015  PRIMARY CARE PHYSICIAN: Eliezer Lofts, MD   ADMISSION DIAGNOSIS:  SOB (shortness of breath) [R06.02] Congestive heart failure, unspecified congestive heart failure chronicity, unspecified congestive heart failure type ([place]) [I50.9] Community acquired pneumonia, unspecified laterality [J18.9]  DISCHARGE DIAGNOSIS:  Active Problems:   Acute respiratory failure (HCC)   Goals of care, counseling/discussion   Palliative care encounter   Bilateral pneumonia   Acute on chronic systolic congestive heart failure ([place])   SECONDARY DIAGNOSIS:   Past Medical History:  Diagnosis Date  . Allergic rhinitis   . CKD (chronic kidney disease)   . COPD (chronic obstructive pulmonary disease) ([place])   . Diastolic CHF, acute (HCC)    echo (4/11) with EF 60-65%,mild LVH, mild aortic insufficiency, mild MR, PA systolic pressure 40-98 mmHg, mild to moderate TR  . Diverticulosis of colon   . GERD (gastroesophageal reflux disease)   . GI bleed   . H/O Clostridium difficile infection Sept. 2013  . History of PFTs   . Hyperlipidemia   . Hypertension    LE swelling with amlodipine, intolerant of ACEIs  . Impaired vision    right eye, was told due to HTN  . LBBB (left bundle branch block)   . Osteoarthritis      ADMITTING HISTORY  HISTORY OF PRESENT ILLNESS:   83 Y/O female with a past medical history of COPD, congestive heart failure, hyperlipidemia, hypertension and chronic kidney disease presenting via EMS with complaints of dyspnea. Patient lives in a nursing home. History is obtained from patient and ED/EMS records. Patient states that she was recently treated for a respiratory tract infection with an antibiotic and inhaler and she felt better but yesterday she  suddenly became short of breath. Upon EMS arrival, patient was hypoxic with SPO2 in the low 80s. She was placed on CPAP and transferred to the emergency room. At the ED, SPO2 remained in the 80s. She was placed on BiPAP with some improvement. She reports mild improvement in dyspnea. Denies chest pain, palpitations, nausea, vomiting, diarrhea and dizziness. PCCM was called to admit.  Her chest x-ray showed multilobar bronchopneumonia. Per EMS, patient was recently diagnosed with lung cancer, but declined treatment due to her age. She was seen by Dr. Oliva Bustard in 2013 but I do not see a follow-up lung biopsy or bronchoscopy. She has been treated for multiple upper respiratory tract infections and, COPD exacerbations   PAST MEDICAL HISTORY :  She  has a past medical history of Allergic rhinitis; CKD (chronic kidney disease); COPD (chronic obstructive pulmonary disease) ([place]); Diastolic CHF, acute ([place]); Diverticulosis of colon; GERD (gastroesophageal reflux disease); GI bleed; H/O Clostridium difficile infection (Sept. 2013); History of PFTs; Hyperlipidemia; Hypertension; Impaired vision; LBBB (left bundle branch block); and Osteoarthritis.  HOSPITAL COURSE:   * Acute hypoxic respiratory failure due to multifocal pneumonia and acute on chronic systolic CHF Wean oxygen as tolerated. Nebulizers as needed.  * Multifocal pneumonia On IV antibiotics change to Azithromycin PO. Afebrile. Cultures no growth to date.  * Acute on chronic systolic CHF Treated with IV lasix and diuresed well. Changed to PO lasix Ejection fraction 25-30%.  * Hypertension Stop hydralazine and Norvasc Continue Bystolic  * CKD stage III is stable Cr baseline 1.8 - 2  *  Anemia of chronic disease Stable  * Hyponatremia and hyperkalemia - resolved  Stable for discharge SNF for PT  CONSULTS OBTAINED:    DRUG ALLERGIES:   Allergies  Allergen Reactions  . Levofloxacin Other (See Comments)    REACTION: Burning in  legs  . Penicillins Rash and Other (See Comments)    Patient unable to answer follow up questions at this time    DISCHARGE MEDICATIONS:   Current Discharge Medication List    START taking these medications   Details  azithromycin (ZITHROMAX) 500 MG tablet Take 1 tablet (500 mg total) by mouth daily. Qty: 3 tablet, Refills: 0    predniSONE (DELTASONE) 50 MG tablet Take 1 tablet (50 mg total) by mouth daily with breakfast. Qty: 3 tablet, Refills: 0      CONTINUE these medications which have CHANGED   Details  furosemide (LASIX) 20 MG tablet Take 1 tablet (20 mg total) by mouth every other day. Qty: 30 tablet    traMADol (ULTRAM) 50 MG tablet Take 1 tablet (50 mg total) by mouth every 8 (eight) hours as needed for severe pain. Qty: 20 tablet, Refills: 0      CONTINUE these medications which have NOT CHANGED   Details  acetaminophen (TYLENOL) 500 MG tablet Take 500 mg by mouth every 6 (six) hours as needed for mild pain or fever.     ADVAIR DISKUS 250-50 MCG/DOSE AEPB Inhale 1 puff into the  lungs two times daily Qty: 180 each, Refills: 1    aspirin 81 MG EC tablet Take 81 mg by mouth daily.      BYSTOLIC 10 MG tablet Take 1 tablet by mouth  daily Qty: 90 tablet, Refills: 1    ferrous fumarate (HEMOCYTE - 106 MG FE) 325 (106 FE) MG TABS tablet Take 1 tablet (106 mg of iron total) by mouth daily. Qty: 90 each, Refills: 3    fluticasone (FLONASE) 50 MCG/ACT nasal spray Place 2 sprays into both nostrils daily. Qty: 48 g, Refills: 11    gabapentin (NEURONTIN) 600 MG tablet Take 1 tablet by mouth at  bedtime Qty: 90 tablet, Refills: 3    Lactobacillus Rhamnosus, GG, (CULTURELLE PO) Take 1 tablet by mouth 2 (two) times daily.    lovastatin (MEVACOR) 20 MG tablet Take 1 tablet by mouth at  bedtime Qty: 90 tablet, Refills: 3    omeprazole (PRILOSEC) 40 MG capsule Take 1 capsule (40 mg total) by mouth daily. Qty: 90 capsule, Refills: 1    PROAIR HFA 108 (90 Base) MCG/ACT  inhaler INHALE 2 PUFFS EVERY 6  HOURS AS NEEDED FOR  WHEEZING OR SHORTNESS OF  BREATH. Qty: 34 g, Refills: 2    SPIRIVA HANDIHALER 18 MCG inhalation capsule INHALE THE CONTENTS OF 1  CAPSULE VIA HANDIHALER  DAILY Qty: 90 capsule, Refills: 3    vitamin E 1000 UNIT capsule Take 1,000 Units by mouth daily.        STOP taking these medications     amLODipine (NORVASC) 5 MG tablet      hydrALAZINE (APRESOLINE) 100 MG tablet      oxybutynin (DITROPAN) 5 MG tablet         Today   VITAL SIGNS:  Blood pressure (!) 122/58, pulse 74, temperature 97.7 F (36.5 C), temperature source Oral, resp. rate 17, height '5\' 2"'$  (1.575 m), weight 55.6 kg (122 lb 8 oz), SpO2 94 %.  I/O:   Intake/Output Summary (Last 24 hours) at 11/02/16 1032 Last data filed  at 11/02/16 1016  Gross per 24 hour  Intake              540 ml  Output              600 ml  Net              -60 ml    PHYSICAL EXAMINATION:  Physical Exam  GENERAL:  83 y.o.-year-old patient lying in the bed with no acute distress.  LUNGS: Mild wheezing CARDIOVASCULAR: S1, S2 normal. No murmurs, rubs, or gallops.  ABDOMEN: Soft, non-tender, non-distended. Bowel sounds present. No organomegaly or mass.  NEUROLOGIC: Moves all 4 extremities. PSYCHIATRIC: The patient is alert and awake  DATA REVIEW:   CBC  Recent Labs Lab 10/30/16 0321  WBC 5.5  HGB 9.9*  HCT 30.1*  PLT 159    Chemistries   Recent Labs Lab 10/28/16 2021 10/29/16 0504  11/01/16 0351  NA 134* 136  < > 140  K 5.2* 4.8  < > 3.9  CL 101 104  < > 100*  CO2 21* 22  < > 30  GLUCOSE 217* 108*  < > 113*  BUN 40* 48*  < > 55*  CREATININE 2.18* 1.95*  < > 2.02*  CALCIUM 8.8* 8.1*  < > 8.5*  MG  --  2.2  --   --   AST 46*  --   --   --   ALT 17  --   --   --   ALKPHOS 70  --   --   --   BILITOT 0.4  --   --   --   < > = values in this interval not displayed.  Cardiac Enzymes  Recent Labs Lab 10/28/16 2021  TROPONINI 0.03*    Microbiology Results   Results for orders placed or performed during the hospital encounter of 10/28/16  Blood culture (routine x 2)     Status: None   Collection Time: 10/28/16  9:42 PM  Result Value Ref Range Status   Specimen Description BLOOD RIGHT ARM  Final   Special Requests   Final    BOTTLES DRAWN AEROBIC AND ANAEROBIC AERO13ML,ANAERO11ML   Culture NO GROWTH 5 DAYS  Final   Report Status 11/02/2016 FINAL  Final  Blood culture (routine x 2)     Status: None   Collection Time: 10/28/16  9:42 PM  Result Value Ref Range Status   Specimen Description BLOOD LEFT ASSIST CONTROL  Final   Special Requests   Final    BOTTLES DRAWN AEROBIC AND ANAEROBIC AERO9ML,ANAERO10ML   Culture NO GROWTH 5 DAYS  Final   Report Status 11/02/2016 FINAL  Final  Urine culture     Status: Abnormal   Collection Time: 10/28/16 10:01 PM  Result Value Ref Range Status   Specimen Description URINE, RANDOM  Final   Special Requests NONE  Final   Culture MULTIPLE SPECIES PRESENT, SUGGEST RECOLLECTION (A)  Final   Report Status 10/30/2016 FINAL  Final  MRSA PCR Screening     Status: None   Collection Time: 10/28/16 11:50 PM  Result Value Ref Range Status   MRSA by PCR NEGATIVE NEGATIVE Final    Comment:        The GeneXpert MRSA Assay (FDA approved for NASAL specimens only), is one component of a comprehensive MRSA colonization surveillance program. It is not intended to diagnose MRSA infection nor to guide or monitor treatment for MRSA infections.  RADIOLOGY:  No results found.  Follow up with PCP in 1 week.  Management plans discussed with the patient, family and they are in agreement.  CODE STATUS:     Code Status Orders        Start     Ordered   10/28/16 2148  Full code  Continuous     10/28/16 2156    Code Status History    Date Active Date Inactive Code Status Order ID Comments User Context   01/12/2016  8:05 AM 01/13/2016  7:19 PM Full Code [id number]  Juluis Mire, MD Inpatient       TOTAL TIME TAKING CARE OF THIS PATIENT ON DAY OF DISCHARGE: more than 30 minutes.   Hillary Bow R M.D on 11/02/2016 at 10:32 AM  Between 7am to 6pm - Pager - [phone]  After 6pm go to [email or web address] - password [email or web address] [facility]  [facility] [place]  Office  [phone]  CC: Primary care physician; Eliezer Lofts, MD  Note: This dictation was prepared with Dragon dictation along with smaller phrase technology. Any transcriptional errors that result from this process are unintentional.

## 2016-11-02 NOTE — Clinical Social Work Note (Signed)
MD to discharge patient today to rehab. Patient has had a bed offer from facility of choice: [facility]. CSW has notified Sharyn Lull at [facility] and they can accept today. Discharge information sent to [facility]. CSW notified patient's daughter, Ms. Loma Sender, and she is aware and in agreement.  Shela Leff MSW,LCSW [phone]

## 2016-11-02 NOTE — Progress Notes (Signed)
Pt A and O x 4. VSS. Pt tolerating diet well. No complaints of pain or nausea. IV removed intact. Pt voiced understanding of discharge instructions with no further questions. Pt discharged via EMS. Report called to facility. Patient left with discharge packet.

## 2016-11-02 NOTE — Care Management Important Message (Signed)
Important Message  Patient Details  Name: Elizabeth Silva MRN: [id number] Date of Birth: Dec 14, 1917   Medicare Important Message Given:  Yes    Beverly Sessions, RN 11/02/2016, 11:17 AM

## 2016-11-02 NOTE — Progress Notes (Signed)
BP 95/59, pt asymptomatic. Dr Ara Kussmaul notified, will hold pm Norvasc per order,will monitor.

## 2016-11-02 NOTE — Clinical Social Work Placement (Signed)
   CLINICAL SOCIAL WORK PLACEMENT  NOTE  Date:  11/02/2016  Patient Details  Name: Elizabeth Silva MRN: [id number] Date of Birth: [birth year]  Clinical Social Work is seeking post-discharge placement for this patient at the [place] level of care (*CSW will initial, date and re-position this form in  chart as items are completed):  Yes   Patient/family provided with [facility] Work Department's list of facilities offering this level of care within the geographic area requested by the patient (or if unable, by the patient's family).  Yes   Patient/family informed of their freedom to choose among providers that offer the needed level of care, that participate in Medicare, Medicaid or managed care program needed by the patient, have an available bed and are willing to accept the patient.  Yes   Patient/family informed of [facility]'s ownership interest in [facility] and [facility], as well as of the fact that they are under no obligation to receive care at these facilities.  PASRR submitted to EDS on 11/01/16     PASRR number received on 11/01/16     Existing PASRR number confirmed on       FL2 transmitted to all facilities in geographic area requested by pt/family on       FL2 transmitted to all facilities within larger geographic area on       Patient informed that his/her managed care company has contracts with or will negotiate with certain facilities, including the following:        Yes   Patient/family informed of bed offers received.  Patient chooses bed at  [facility])     Physician recommends and patient chooses bed at  [facility])    Patient to be transferred to  [facility] on 11/02/16.  Patient to be transferred to facility by  (EMS)     Patient family notified on 11/02/16 of transfer.  Name of family member notified:   (daughter: Lucile Shutters)     PHYSICIAN       Additional Comment:     _______________________________________________ Shela Leff, LCSW 11/02/2016, 11:15 AM

## 2016-11-05 ENCOUNTER — Telehealth: Payer: Self-pay | Admitting: *Deleted

## 2016-11-05 DIAGNOSIS — I509 Heart failure, unspecified: Secondary | ICD-10-CM | POA: Diagnosis not present

## 2016-11-05 DIAGNOSIS — J189 Pneumonia, unspecified organism: Secondary | ICD-10-CM | POA: Diagnosis not present

## 2016-11-05 DIAGNOSIS — I10 Essential (primary) hypertension: Secondary | ICD-10-CM | POA: Diagnosis not present

## 2016-11-05 NOTE — Telephone Encounter (Signed)
Called patient and left message to return call

## 2016-11-06 ENCOUNTER — Telehealth: Payer: Self-pay | Admitting: *Deleted

## 2016-11-06 ENCOUNTER — Non-Acute Institutional Stay (SKILLED_NURSING_FACILITY): Payer: Medicare Other | Admitting: Gerontology

## 2016-11-06 DIAGNOSIS — J101 Influenza due to other identified influenza virus with other respiratory manifestations: Secondary | ICD-10-CM

## 2016-11-06 DIAGNOSIS — R509 Fever, unspecified: Secondary | ICD-10-CM | POA: Diagnosis not present

## 2016-11-06 DIAGNOSIS — R05 Cough: Secondary | ICD-10-CM | POA: Diagnosis not present

## 2016-11-06 DIAGNOSIS — R5381 Other malaise: Secondary | ICD-10-CM | POA: Diagnosis not present

## 2016-11-06 DIAGNOSIS — R531 Weakness: Secondary | ICD-10-CM | POA: Diagnosis not present

## 2016-11-06 LAB — COMPREHENSIVE METABOLIC PANEL
ALK PHOS: 53 U/L (ref 38–126)
ALT: 16 U/L (ref 14–54)
AST: 20 U/L (ref 15–41)
Albumin: 3.4 g/dL — ABNORMAL LOW (ref 3.5–5.0)
Anion gap: 10 (ref 5–15)
BILIRUBIN TOTAL: 0.6 mg/dL (ref 0.3–1.2)
BUN: 84 mg/dL — AB (ref 6–20)
CALCIUM: 8.8 mg/dL — AB (ref 8.9–10.3)
CO2: 26 mmol/L (ref 22–32)
CREATININE: 1.83 mg/dL — AB (ref 0.44–1.00)
Chloride: 103 mmol/L (ref 101–111)
GFR calc Af Amer: 26 mL/min — ABNORMAL LOW (ref >60)
GFR calc non Af Amer: 22 mL/min — ABNORMAL LOW (ref >60)
Glucose, Bld: 113 mg/dL — ABNORMAL HIGH (ref 65–99)
POTASSIUM: 4.2 mmol/L (ref 3.5–5.1)
Sodium: 139 mmol/L (ref 135–145)
TOTAL PROTEIN: 6.7 g/dL (ref 6.5–8.1)

## 2016-11-06 LAB — CBC WITH DIFFERENTIAL/PLATELET
BASOS ABS: 0 10*3/uL (ref 0–0.1)
Basophils Relative: 0 %
Eosinophils Absolute: 0 10*3/uL (ref 0–0.7)
Eosinophils Relative: 0 %
HCT: 35.7 % (ref 35.0–47.0)
Hemoglobin: 11.4 g/dL — ABNORMAL LOW (ref 12.0–16.0)
LYMPHS PCT: 9 %
Lymphs Abs: 1.4 10*3/uL (ref 1.0–3.6)
MCH: 28.1 pg (ref 26.0–34.0)
MCHC: 32 g/dL (ref 32.0–36.0)
MCV: 87.7 fL (ref 80.0–100.0)
MONO ABS: 1.9 10*3/uL — AB (ref 0.2–0.9)
MONOS PCT: 12 %
NEUTROS ABS: 12.6 10*3/uL — AB (ref 1.4–6.5)
Neutrophils Relative %: 79 %
Platelets: 272 10*3/uL (ref 150–440)
RBC: 4.07 MIL/uL (ref 3.80–5.20)
RDW: 14.8 % — AB (ref 11.5–14.5)
WBC: 15.9 10*3/uL — ABNORMAL HIGH (ref 3.6–11.0)

## 2016-11-06 NOTE — Telephone Encounter (Signed)
Called patient and left message to return call

## 2016-11-07 DIAGNOSIS — R05 Cough: Secondary | ICD-10-CM | POA: Diagnosis not present

## 2016-11-07 DIAGNOSIS — R5381 Other malaise: Secondary | ICD-10-CM | POA: Diagnosis not present

## 2016-11-07 DIAGNOSIS — R509 Fever, unspecified: Secondary | ICD-10-CM | POA: Diagnosis not present

## 2016-11-07 DIAGNOSIS — R531 Weakness: Secondary | ICD-10-CM | POA: Diagnosis not present

## 2016-11-07 LAB — INFLUENZA PANEL BY PCR (TYPE A & B)
Influenza A By PCR: POSITIVE — AB
Influenza B By PCR: NEGATIVE

## 2016-11-07 LAB — URINALYSIS, COMPLETE (UACMP) WITH MICROSCOPIC
Bacteria, UA: NONE SEEN
Bilirubin Urine: NEGATIVE
Glucose, UA: NEGATIVE mg/dL
Hgb urine dipstick: NEGATIVE
KETONES UR: NEGATIVE mg/dL
Nitrite: NEGATIVE
PH: 5 (ref 5.0–8.0)
Protein, ur: 30 mg/dL — AB
SQUAMOUS EPITHELIAL / LPF: NONE SEEN
Specific Gravity, Urine: 1.015 (ref 1.005–1.030)

## 2016-11-08 LAB — URINE CULTURE

## 2016-11-09 ENCOUNTER — Non-Acute Institutional Stay (SKILLED_NURSING_FACILITY): Payer: Medicare Other | Admitting: Gerontology

## 2016-11-09 DIAGNOSIS — M109 Gout, unspecified: Secondary | ICD-10-CM

## 2016-11-13 ENCOUNTER — Other Ambulatory Visit: Payer: Self-pay | Admitting: Internal Medicine

## 2016-11-13 DIAGNOSIS — R1312 Dysphagia, oropharyngeal phase: Secondary | ICD-10-CM

## 2016-11-15 ENCOUNTER — Inpatient Hospital Stay: Admit: 2016-11-15 | Payer: Self-pay

## 2016-11-15 ENCOUNTER — Other Ambulatory Visit: Payer: Self-pay | Admitting: Family Medicine

## 2016-11-15 ENCOUNTER — Encounter
Admission: RE | Admit: 2016-11-15 | Discharge: 2016-11-15 | Disposition: A | Discharge status: Home / Self Care | Payer: Medicare Other | Source: Ambulatory Visit | Attending: Internal Medicine | Admitting: Internal Medicine

## 2016-11-15 DIAGNOSIS — R300 Dysuria: Secondary | ICD-10-CM | POA: Insufficient documentation

## 2016-11-21 ENCOUNTER — Ambulatory Visit
Admission: RE | Admit: 2016-11-21 | Discharge: 2016-11-21 | Disposition: A | Discharge status: Home / Self Care | Payer: Medicare Other | Source: Ambulatory Visit | Attending: Internal Medicine | Admitting: Internal Medicine

## 2016-11-21 DIAGNOSIS — R1312 Dysphagia, oropharyngeal phase: Secondary | ICD-10-CM | POA: Diagnosis not present

## 2016-11-21 DIAGNOSIS — R1310 Dysphagia, unspecified: Secondary | ICD-10-CM | POA: Diagnosis not present

## 2016-11-21 DIAGNOSIS — R633 Feeding difficulties: Secondary | ICD-10-CM | POA: Diagnosis not present

## 2016-11-21 NOTE — Therapy (Signed)
[place], [place], [address] Phone: [phone]   Fax:     Modified Barium Swallow  Patient Details  Name: Elizabeth Silva MRN: [id number] Date of Birth: 27-Mar-1918 No Data Recorded  Encounter Date: 11/21/2016      End of Session - 11/21/16 1351    Visit Number 1   Number of Visits 1   Date for SLP Re-Evaluation 11/21/16   Activity Tolerance Patient tolerated treatment well      Past Medical History:  Diagnosis Date  . Allergic rhinitis   . CKD (chronic kidney disease)   . COPD (chronic obstructive pulmonary disease) ([facility])   . Diastolic CHF, acute (HCC)    echo (4/11) with EF 60-65%,mild LVH, mild aortic insufficiency, mild MR, PA systolic pressure 09-38 mmHg, mild to moderate TR  . Diverticulosis of colon   . GERD (gastroesophageal reflux disease)   . GI bleed   . H/O Clostridium difficile infection Sept. 2013  . History of PFTs   . Hyperlipidemia   . Hypertension    LE swelling with amlodipine, intolerant of ACEIs  . Impaired vision    right eye, was told due to HTN  . LBBB (left bundle branch block)   . Osteoarthritis     Past Surgical History:  Procedure Laterality Date  . CARDIAC CATHETERIZATION  1999   normal  . CERVICAL [place] SURGERY  2005   cerv. disc, dz. C5-C7, facet arthritis  . CHOLECYSTECTOMY  1980  . TOTAL ABDOMINAL HYSTERECTOMY  1970   suspicious cells    There were no vitals filed for this visit.   Subjective: Patient behavior: (alertness, ability to follow instructions, etc.): The patient is alert, able to follow simple directions, has a strong vocal quality  Chief complaint: S/S aspiration.  Patient also has a recent diagnosis of lung cancer and has opted not to treat this    Objective:  Radiological Procedure: A videoflouroscopic evaluation of oral-preparatory, reflex initiation, and pharyngeal phases of the swallow was performed; as well as a screening of  the upper esophageal phase.  I. POSTURE: Upright in MBS chair  II. VIEW: Lateral  III. COMPENSATORY STRATEGIES: N/A  IV. BOLUSES ADMINISTERED:   Thin Liquid: 1 cup rim   Nectar-thick Liquid: 1 cup rim   Honey-thick Liquid: NT   Puree: 1 teaspoon   Mechanical Soft: 1/4 graham cracker in applesauce V. RESULTS OF EVALUATION: A. ORAL PREPARATORY PHASE: (The lips, tongue, and velum are observed for strength and coordination)       **Overall Severity Rating: Mild; slowed posterior tranfser, slowed mastication; pre-swallow spill over base of tongue  B. SWALLOW INITIATION/REFLEX: (The reflex is normal if "triggered" by the time the bolus reached the base of the tongue)  **Overall Severity Rating: Moderate-severe; triggers at pyriform sinuses  C. PHARYNGEAL PHASE: (Pharyngeal function is normal if the bolus shows rapid, smooth, and continuous transit through the pharynx and there is no pharyngeal residue after the swallow)  **Overall Severity Rating: Moderate; decreased hyolaryngeal movement, decreased tongue base retraction, incomplete laryngeal vestibule closure at the height of the swallow, incomplete epiglottic inversion, mild vallecular residue post swallow  D. LARYNGEAL PENETRATION: (Material entering into the laryngeal inlet/vestibule but not aspirated) During swallow with solids and nectar-thick liquid with laryngeal vestibule residue to the vocal cords  E. ASPIRATION: Greater than trace thin liquid, during the swallow, no cough  F. ESOPHAGEAL PHASE: (Screening of the upper esophagus): esophageal stasis and slow movement  ASSESSMENT: 83 year old woman; with concern for aspiration; is presenting with moderate-severe oropharyngeal dysphagia characterized by slowed posterior transfer, delayed pharyngeal swallow initiation, decreased tongue base retraction, decreased hyolaryngeal movement, incomplete epiglottic inversion, and mild vallecular residue.  There was is laryngeal penetration,  with laryngeal vestibule residue, with solids and nectar-thick liquid.  There is aspiration (greater than trace, no cough) with thin liquid.  The patient was able to effectively masticate a crumbled cracker within a moist substrate.  The patient is at significant risk for ongoing, chronic aspiration.  In view of the patient's age and recent decision to not treat lung cancer, would recommend patient continue regular or mechanical soft diet for quality of life.  Strongly recommend stringent oral care, including removal of upper dentures and thorough cleaning of the oral cavity and dentures.  PLAN/RECOMMENDATIONS:   A. Diet: Regular/mechanical soft   B. Swallowing Precautions: Stringent oral  Care   C. Recommended consultation to: PCP, quality of life   D. Therapy recommendations: follow up for education   E. Results and recommendations were discussed with the patient and a daughter immediately following the study and the final report routed to the referring MD and treating SLP    Patient will benefit from skilled therapeutic intervention in order to improve the following deficits and impairments:   Oropharyngeal dysphagia - Plan: DG OP Swallowing Func-Medicare/Speech Path, DG OP Swallowing Func-Medicare/Speech Path      G-Codes - December 13, 2016 1351    Functional Assessment Tool Used MBSS, clinical judgment   Functional Limitations Swallowing   Swallow Current Status (U2725) At least 60 percent but less than 80 percent impaired, limited or restricted   Swallow Goal Status (D6644) At least 60 percent but less than 80 percent impaired, limited or restricted   Swallow Discharge Status [phone]) At least 60 percent but less than 80 percent impaired, limited or restricted          Problem List Patient Active Problem List   Diagnosis Date Noted  . Bilateral pneumonia 11/02/2016  . Acute on chronic systolic congestive heart failure ([facility]) 11/02/2016  . Goals of care, counseling/discussion   .  Palliative care encounter   . Acute respiratory failure ([facility]) 10/28/2016  . Cellulitis of leg, left 06/08/2016  . Acute renal failure ([facility]) 06/08/2016  . Leg hematoma 05/30/2016  . Upper GI bleed 04/26/2016  . Multiple pulmonary nodules 04/12/2016  . Chronic constipation 04/12/2016  . Melena 01/12/2016  . Abnormal chest x-ray 01/12/2016  . Advance directive discussed with patient 01/10/2016  . Community acquired pneumonia 10/07/2015  . Vision loss, bilateral 10/26/2014  . Carotid stenosis 10/26/2014  . Bilateral tinnitus 04/20/2014  . Prediabetes 01/07/2014  . Bilateral knee pain 01/07/2014  . Hip pain, left 01/07/2014  . COPD exacerbation ([facility]) 10/14/2013  . Fatigue 08/28/2013  . Insomnia 01/02/2013  . Anemia of chronic renal failure 12/25/2010  . CONSTIPATION 12/08/2010  . DIASTOLIC HEART FAILURE, CHRONIC 03/02/2010  . CAROTID BRUIT, RIGHT 03/02/2010  . INCONTINENCE, URGE 01/25/2010  . PERIPHERAL NEUROPATHY, IDIOPATHIC 05/10/2008  . DYSURIA, CHRONIC 02/27/2008  . PERIPHERAL VASCULAR DISEASE 05/05/2007  . Chronic venous insufficiency 05/05/2007  . ALLERGIC RHINITIS 05/05/2007  . COPD, mild ([facility]) 05/05/2007  . GERD 05/05/2007  . DIVERTICULOSIS, COLON 05/05/2007  . Osteoarthritis, multiple sites 05/05/2007  . DEGENERATION, CERVICAL DISC 05/05/2007  . STRESS INCONTINENCE 05/05/2007  . HYPERCHOLESTEROLEMIA, PURE 04/07/2007  . RENAL STONE 04/07/2007  . Essential hypertension, benign 03/17/2007  . Chronic kidney disease, stage 4, severely decreased  GFR ([facility]) 03/17/2007  . CARDIAC MURMUR 03/17/2007   Leroy Sea, MS/CCC- SLP  Lou Miner 11/21/2016, 1:52 PM  [place], [place], [address] Phone: [phone]   Fax:     Name: Elizabeth Silva MRN: [id number] Date of Birth: 27-Feb-1918

## 2016-11-22 ENCOUNTER — Ambulatory Visit: Payer: Medicare Other | Admitting: Cardiovascular Disease

## 2016-11-23 DIAGNOSIS — M109 Gout, unspecified: Secondary | ICD-10-CM | POA: Insufficient documentation

## 2016-11-23 NOTE — Progress Notes (Signed)
Location:      Place of Service:  SNF (31) Provider:  Toni Arthurs, NP-C  Eliezer Lofts, MD  Patient Care Team: Jinny Sanders, MD as PCP - General Minna Merritts, MD as Consulting Physician (Cardiology)  Extended Emergency Contact Information Primary Emergency Contact: Hepler,Sue Address: [address].          [place], [place] [address] [place] Phone: [phone] Mobile Phone: [phone] Relation: Daughter Secondary Emergency Contact: Janyth Contes [place] Phone: [phone] Relation: Daughter  Code Status:  full Goals of care: Advanced Directive information Advanced Directives 10/29/2016  Does Patient Have a Medical Advance Directive? No  Type of Advance Directive -  Does patient want to make changes to medical advance directive? -  Copy of [place] in Chart? -  Would patient like information on creating a medical advance directive? No - Patient declined     Chief Complaint  Patient presents with  . Acute Visit    HPI:  Pt is a 83 y.o. female seen today for an acute visit for pain in the left wrist. Pt c/o pain that started last night. Denies trauma. No h/o fall. Pt has been using walker with therapy for ambulation. Pt reluctantly reports a h/o a gout flair in the past (unspecified joint.) Wrist is red, warm, edematous. Painful to light touch/ palpation. Palpation to whisp of a tissue. B- radial pulses equal. No shoulder pain. No forearm or elbow pain. VSS. No other complaints.    Past Medical History:  Diagnosis Date  . Allergic rhinitis   . CKD (chronic kidney disease)   . COPD (chronic obstructive pulmonary disease) ([facility])   . Diastolic CHF, acute (HCC)    echo (4/11) with EF 60-65%,mild LVH, mild aortic insufficiency, mild MR, PA systolic pressure 53-97 mmHg, mild to moderate TR  . Diverticulosis of colon   . GERD (gastroesophageal reflux disease)   . GI bleed   . H/O Clostridium difficile  infection Sept. 2013  . History of PFTs   . Hyperlipidemia   . Hypertension    LE swelling with amlodipine, intolerant of ACEIs  . Impaired vision    right eye, was told due to HTN  . LBBB (left bundle branch block)   . Osteoarthritis    Past Surgical History:  Procedure Laterality Date  . CARDIAC CATHETERIZATION  1999   normal  . CERVICAL [place] SURGERY  2005   cerv. disc, dz. C5-C7, facet arthritis  . CHOLECYSTECTOMY  1980  . TOTAL ABDOMINAL HYSTERECTOMY  1970   suspicious cells    Allergies  Allergen Reactions  . Levofloxacin Other (See Comments)    REACTION: Burning in legs  . Penicillins Rash and Other (See Comments)    Patient unable to answer follow up questions at this time    Allergies as of 11/09/2016      Reactions   Levofloxacin Other (See Comments)   REACTION: Burning in legs   Penicillins Rash, Other (See Comments)   Patient unable to answer follow up questions at this time      Medication List       Accurate as of 11/09/16 11:59 PM. Always use your most recent med list.          acetaminophen 500 MG tablet Commonly known as:  TYLENOL Take 500 mg by mouth every 6 (six) hours as needed for mild pain or fever.   ADVAIR DISKUS 250-50 MCG/DOSE Aepb Generic drug:  Fluticasone-Salmeterol  Inhale 1 puff into the  lungs two times daily   aspirin 81 MG EC tablet Take 81 mg by mouth daily.   azithromycin 500 MG tablet Commonly known as:  ZITHROMAX Take 1 tablet (500 mg total) by mouth daily.   CULTURELLE PO Take 1 tablet by mouth 2 (two) times daily.   ferrous fumarate 325 (106 Fe) MG Tabs tablet Commonly known as:  HEMOCYTE - 106 mg FE Take 1 tablet (106 mg of iron total) by mouth daily.   fluticasone 50 MCG/ACT nasal spray Commonly known as:  FLONASE Place 2 sprays into both nostrils daily.   furosemide 20 MG tablet Commonly known as:  LASIX Take 1 tablet (20 mg total) by mouth every other day.   gabapentin 600 MG tablet Commonly known as:   NEURONTIN Take 1 tablet by mouth at  bedtime   lovastatin 20 MG tablet Commonly known as:  MEVACOR Take 1 tablet by mouth at  bedtime   predniSONE 50 MG tablet Commonly known as:  DELTASONE Take 1 tablet (50 mg total) by mouth daily with breakfast.   PROAIR HFA 108 (90 Base) MCG/ACT inhaler Generic drug:  albuterol INHALE 2 PUFFS EVERY 6  HOURS AS NEEDED FOR  WHEEZING OR SHORTNESS OF  BREATH.   SPIRIVA HANDIHALER 18 MCG inhalation capsule Generic drug:  tiotropium INHALE THE CONTENTS OF 1  CAPSULE VIA HANDIHALER  DAILY   traMADol 50 MG tablet Commonly known as:  ULTRAM Take 1 tablet (50 mg total) by mouth every 8 (eight) hours as needed for severe pain.   vitamin E 1000 UNIT capsule Take 1,000 Units by mouth daily.       Review of Systems  Constitutional: Negative for fatigue and fever.  Musculoskeletal: Positive for arthralgias and joint swelling.  Skin: Negative for rash and wound.  All other systems reviewed and are negative.   Immunization History  Administered Date(s) Administered  . Influenza Split 07/25/2011, 07/08/2012  . Influenza Whole 08/15/2006, 07/14/2009, 08/11/2010  . Influenza,inj,Quad PF,36+ Mos 07/09/2013, 07/20/2014, 08/12/2015, 06/08/2016  . Pneumococcal Conjugate-13 01/07/2014  . Pneumococcal Polysaccharide-23 10/15/2005, 01/02/2013  . Td 07/14/2009  . Zoster 10/15/2006   Pertinent  Health Maintenance Due  Topic Date Due  . INFLUENZA VACCINE  Completed  . DEXA SCAN  Completed  . PNA vac Low Risk Adult  Completed   Fall Risk  01/10/2016 01/10/2016 01/07/2014 01/02/2013  Falls in the past year? Yes No No No  Number falls in past yr: 1 - - -  Injury with Fall? No - - -   Functional Status Survey:    Vitals:   11/09/16 0440  BP: (!) 121/45  Pulse: 65  Resp: 18  Temp: 97.7 F (36.5 C)  SpO2: 97%  Weight: 112 lb 11.2 oz (51.1 kg)   Body mass index is 20.61 kg/m. Physical Exam  Constitutional: Vital signs are normal. She appears  well-developed and well-nourished. She is cooperative.  Musculoskeletal:       Left wrist: She exhibits decreased range of motion, tenderness and swelling.  gout  Neurological: She is alert.  Skin: Skin is warm, dry and intact. Ecchymosis noted.       Labs reviewed:  Recent Labs  10/29/16 0504  10/31/16 0501 11/01/16 0351 11/06/16 1500  NA 136  < > 140 140 139  K 4.8  < > 4.1 3.9 4.2  CL 104  < > 102 100* 103  CO2 22  < > '28 30 26  '$ GLUCOSE 108*  < >  101* 113* 113*  BUN 48*  < > 45* 55* 84*  CREATININE 1.95*  < > 1.74* 2.02* 1.83*  CALCIUM 8.1*  < > 8.7* 8.5* 8.8*  MG 2.2  --   --   --   --   PHOS 5.4*  --   --   --   --   < > = values in this interval not displayed.  Recent Labs  08/01/16 1420 10/28/16 2021 11/06/16 1500  AST 16 46* 20  ALT '9 17 16  '$ ALKPHOS 64 70 53  BILITOT 0.3 0.4 0.6  PROT 7.6 7.7 6.7  ALBUMIN 4.0 3.8 3.4*    Recent Labs  08/01/16 1420 10/28/16 2021 10/29/16 0504 10/30/16 0321 11/06/16 1500  WBC 12.3* 11.0 5.9 5.5 15.9*  NEUTROABS 9.7* 7.0*  --   --  12.6*  HGB 10.2* 12.1 9.8* 9.9* 11.4*  HCT 30.9* 37.4 29.6* 30.1* 35.7  MCV 86.4 90.3 88.1 88.5 87.7  PLT 315.0 225 158 159 272   Lab Results  Component Value Date   TSH 1.74 08/28/2013   Lab Results  Component Value Date   HGBA1C 5.6 01/10/2016   Lab Results  Component Value Date   CHOL 135 01/10/2016   HDL 40.90 01/10/2016   LDLCALC 60 01/10/2016   LDLDIRECT 122.1 08/17/2010   TRIG 168.0 (H) 01/10/2016   CHOLHDL 3 01/10/2016    Significant Diagnostic Results in last 30 days:  Dg Chest 2 View  Result Date: 10/25/2016 CLINICAL DATA:  Previous pneumonia with persistent cough EXAM: CHEST  2 VIEW COMPARISON:  Chest radiograph August 01, 2016 ; chest CT November 07, 2015 FINDINGS: There is persistent patchy opacity with volume loss in the right middle lobe as well as ill-defined areas of opacity in the left mid and lower lung zones. No new opacity is evident. Heart is upper  normal in size with pulmonary vascularity within normal limits. There is atherosclerotic calcification in the aorta. No evident adenopathy. There is degenerative change in the thoracic spine. There is lower thoracic levoscoliosis. IMPRESSION: Persistent areas of opacity in the left mid and lower lung zones as well as persistent consolidation with volume loss right middle lobe. No new opacity. Stable cardiac silhouette. There is aortic atherosclerosis. Given the persistence of the opacities, correlation with noncontrast enhanced chest CT is advised to further assess. These results will be called to the ordering clinician or representative by the Radiologist Assistant, and communication documented in the PACS or zVision Dashboard. Electronically Signed   By: Lowella Grip III M.D.   On: 10/25/2016 16:30   Dg Op Swallowing Func-medicare/speech Path  Result Date: 11/21/2016 CLINICAL DATA:  Increasing dysphagia following recent hospitalization EXAM: MODIFIED BARIUM SWALLOW TECHNIQUE: Different consistencies of barium were administered orally to the patient by the Speech Pathologist. Imaging of the pharynx was performed in the lateral projection. FLUOROSCOPY TIME:  Fluoroscopy Time:  1 minutes, 24 seconds Radiation Exposure Index (if provided by the fluoroscopic device): 143 micro Gy per meter square Number of Acquired Spot Images: 0 COMPARISON:  None. FINDINGS: Thin liquid- prompt silent laryngeal penetration. Nectar thick liquid- no penetration observed. Pure- prompt penetration with residual. Pure with cracker- no aspiration observed. IMPRESSION: Considerable laryngeal penetration of thin liquid and the puree occurred without elicitation of a cough reflex. Please refer to the Speech Pathologists report for complete details and recommendations. Electronically Signed   By: David  Martinique M.D.   On: 11/21/2016 13:30   Dg Chest [facility] 1 View  Result  Date: 10/29/2016 CLINICAL DATA:  83 year old female with history  of pneumonia. EXAM: PORTABLE CHEST 1 VIEW COMPARISON:  Chest x-ray 10/28/2016. FINDINGS: Lung volumes are slightly low. Patchy ill-defined airspace opacities in the mid to lower lungs bilaterally appear slightly improved compared to the prior study. No pleural effusions. Crowding of the pulmonary vasculature, without frank pulmonary edema. Mild cardiomegaly. The patient is rotated to the right on today's exam, resulting in distortion of the mediastinal contours and reduced diagnostic sensitivity and specificity for mediastinal pathology. Atherosclerosis in the thoracic aorta. IMPRESSION: 1. Cardiomegaly with pulmonary venous congestion, without frank pulmonary edema. 2. Patchy mid to lower lung ill-defined opacities slightly improved, favored to reflect resolving multilobar bronchopneumonia. 3. Aortic atherosclerosis. Electronically Signed   By: Vinnie Langton M.D.   On: 10/29/2016 07:58   Dg Chest Portable 1 View  Result Date: 10/28/2016 CLINICAL DATA:  Shortness of breath EXAM: PORTABLE CHEST 1 VIEW COMPARISON:  10/25/2016, 08/01/2016, 10/14/2015 FINDINGS: Mild diffuse coarse interstitial opacities are likely chronic. Slight increased opacity in the right mid lung and left base, atelectasis versus mild infiltrate. Stable enlarged cardiomediastinal silhouette without overt failure. Apical pleural scarring. No pneumothorax. Atherosclerosis of the aorta. IMPRESSION: 1. Slight increased right mid lung zone opacity with persistent vague opacity in the left mid lung zone and left base, findings could relate to multifocal infiltrates however nodules not excluded. Chest CT correlation was previously recommended. Electronically Signed   By: Donavan Foil M.D.   On: 10/28/2016 20:45    Assessment/Plan 1. Acute gout of left wrist, unspecified cause  Colchicine 0.6 mg po BID x 5 days  Prednisone 40 mg po Q day x 5 days  Family/ staff Communication:   Total Time:  Documentation:  Face to  Face:  Family/Phone:   Labs/tests ordered:  Xray left wrist, hand  Medication list reviewed and assessed for continued appropriateness.  Vikki Ports, NP-C Geriatrics [facility] [id number], [place] [address] Cell Phone (Mon-Fri 8am-5pm):  [phone] On Call:  [phone] & follow prompts after 5pm & weekends Office Phone:  [phone] Office Fax:  [phone]

## 2016-11-23 NOTE — Progress Notes (Signed)
Location:      Place of Service:  SNF (31) Provider:  Toni Arthurs, NP-C  Eliezer Lofts, MD  Patient Care Team: Jinny Sanders, MD as PCP - General Minna Merritts, MD as Consulting Physician (Cardiology)  Extended Emergency Contact Information Primary Emergency Contact: Hepler,Sue Address: [place].          [place], [place] [address] [place] Phone: [phone] Mobile Phone: [phone] Relation: Daughter Secondary Emergency Contact: Janyth Contes [place] Phone: [phone] Relation: Daughter  Code Status:  full Goals of care: Advanced Directive information Advanced Directives 10/29/2016  Does Patient Have a Medical Advance Directive? No  Type of Advance Directive -  Does patient want to make changes to medical advance directive? -  Copy of [place] in Chart? -  Would patient like information on creating a medical advance directive? No - Patient declined     Chief Complaint  Patient presents with  . Acute Visit    HPI:  Pt is a 83 y.o. female seen today for an acute visit for cough, congestion. Pt reports malaise, fatigue. Reports just overall feeling "sick." Pt completed antibiotic course yesterday for CA-PNA. Tachycardic. Poor appetite. Pt denies n/v/d. Non-productive cough. No chest pain, no SOB. VSS, except tachycardia. No other complaints    Past Medical History:  Diagnosis Date  . Allergic rhinitis   . CKD (chronic kidney disease)   . COPD (chronic obstructive pulmonary disease) ([place])   . Diastolic CHF, acute (HCC)    echo (4/11) with EF 60-65%,mild LVH, mild aortic insufficiency, mild MR, PA systolic pressure 62-83 mmHg, mild to moderate TR  . Diverticulosis of colon   . GERD (gastroesophageal reflux disease)   . GI bleed   . H/O Clostridium difficile infection Sept. 2013  . History of PFTs   . Hyperlipidemia   . Hypertension    LE swelling with amlodipine, intolerant of ACEIs  . Impaired  vision    right eye, was told due to HTN  . LBBB (left bundle branch block)   . Osteoarthritis    Past Surgical History:  Procedure Laterality Date  . CARDIAC CATHETERIZATION  1999   normal  . CERVICAL [place] SURGERY  2005   cerv. disc, dz. C5-C7, facet arthritis  . CHOLECYSTECTOMY  1980  . TOTAL ABDOMINAL HYSTERECTOMY  1970   suspicious cells    Allergies  Allergen Reactions  . Levofloxacin Other (See Comments)    REACTION: Burning in legs  . Penicillins Rash and Other (See Comments)    Patient unable to answer follow up questions at this time    Allergies as of 11/06/2016      Reactions   Levofloxacin Other (See Comments)   REACTION: Burning in legs   Penicillins Rash, Other (See Comments)   Patient unable to answer follow up questions at this time      Medication List       Accurate as of 11/06/16 11:59 PM. Always use your most recent med list.          acetaminophen 500 MG tablet Commonly known as:  TYLENOL Take 500 mg by mouth every 6 (six) hours as needed for mild pain or fever.   ADVAIR DISKUS 250-50 MCG/DOSE Aepb Generic drug:  Fluticasone-Salmeterol Inhale 1 puff into the  lungs two times daily   aspirin 81 MG EC tablet Take 81 mg by mouth daily.   azithromycin 500 MG tablet Commonly known as:  MOQHUTMLY  Take 1 tablet (500 mg total) by mouth daily.   BYSTOLIC 10 MG tablet Generic drug:  nebivolol Take 1 tablet by mouth  daily   CULTURELLE PO Take 1 tablet by mouth 2 (two) times daily.   ferrous fumarate 325 (106 Fe) MG Tabs tablet Commonly known as:  HEMOCYTE - 106 mg FE Take 1 tablet (106 mg of iron total) by mouth daily.   fluticasone 50 MCG/ACT nasal spray Commonly known as:  FLONASE Place 2 sprays into both nostrils daily.   furosemide 20 MG tablet Commonly known as:  LASIX Take 1 tablet (20 mg total) by mouth every other day.   gabapentin 600 MG tablet Commonly known as:  NEURONTIN Take 1 tablet by mouth at  bedtime   lovastatin  20 MG tablet Commonly known as:  MEVACOR Take 1 tablet by mouth at  bedtime   omeprazole 40 MG capsule Commonly known as:  PRILOSEC Take 1 capsule (40 mg total) by mouth daily.   predniSONE 50 MG tablet Commonly known as:  DELTASONE Take 1 tablet (50 mg total) by mouth daily with breakfast.   PROAIR HFA 108 (90 Base) MCG/ACT inhaler Generic drug:  albuterol INHALE 2 PUFFS EVERY 6  HOURS AS NEEDED FOR  WHEEZING OR SHORTNESS OF  BREATH.   SPIRIVA HANDIHALER 18 MCG inhalation capsule Generic drug:  tiotropium INHALE THE CONTENTS OF 1  CAPSULE VIA HANDIHALER  DAILY   traMADol 50 MG tablet Commonly known as:  ULTRAM Take 1 tablet (50 mg total) by mouth every 8 (eight) hours as needed for severe pain.   vitamin E 1000 UNIT capsule Take 1,000 Units by mouth daily.       Review of Systems  Constitutional: Positive for activity change, appetite change and fatigue. Negative for chills, diaphoresis and fever.  HENT: Positive for congestion. Negative for sinus pain, sinus pressure, sneezing, sore throat, trouble swallowing and voice change.   Eyes: Negative for pain, redness and visual disturbance.  Respiratory: Positive for cough. Negative for apnea, choking, chest tightness, shortness of breath and wheezing.   Cardiovascular: Negative for chest pain, palpitations and leg swelling.  Gastrointestinal: Negative for abdominal distention, abdominal pain, constipation, diarrhea and nausea.  Genitourinary: Negative for difficulty urinating, dysuria, frequency and urgency.  Musculoskeletal: Positive for arthralgias (typical arthritis). Negative for back pain, gait problem and myalgias.  Skin: Negative for color change, pallor, rash and wound.  Neurological: Negative for dizziness, tremors, syncope, speech difficulty, weakness, numbness and headaches.  Psychiatric/Behavioral: Negative for agitation and behavioral problems.  All other systems reviewed and are negative.   Immunization History   Administered Date(s) Administered  . Influenza Split 07/25/2011, 07/08/2012  . Influenza Whole 08/15/2006, 07/14/2009, 08/11/2010  . Influenza,inj,Quad PF,36+ Mos 07/09/2013, 07/20/2014, 08/12/2015, 06/08/2016  . Pneumococcal Conjugate-13 01/07/2014  . Pneumococcal Polysaccharide-23 10/15/2005, 01/02/2013  . Td 07/14/2009  . Zoster 10/15/2006   Pertinent  Health Maintenance Due  Topic Date Due  . INFLUENZA VACCINE  Completed  . DEXA SCAN  Completed  . PNA vac Low Risk Adult  Completed   Fall Risk  01/10/2016 01/10/2016 01/07/2014 01/02/2013  Falls in the past year? Yes No No No  Number falls in past yr: 1 - - -  Injury with Fall? No - - -   Functional Status Survey:    Vitals:   11/06/16 0630  BP: (!) 131/40  Resp: (!) 22  Temp: 97.4 F (36.3 C)  SpO2: 99%  Weight: 113 lb 14.4 oz (51.7 kg)  Body mass index is 20.83 kg/m. Physical Exam  Constitutional: She is oriented to person, place, and time. Vital signs are normal. She appears well-developed and well-nourished. She is active and cooperative. She does not appear ill. No distress.  HENT:  Head: Normocephalic and atraumatic.  Mouth/Throat: Uvula is midline, oropharynx is clear and moist and mucous membranes are normal. Mucous membranes are not pale, not dry and not cyanotic.  Eyes: Conjunctivae, EOM and lids are normal. Pupils are equal, round, and reactive to light.  Neck: Trachea normal, normal range of motion and full passive range of motion without pain. Neck supple. No JVD present. No tracheal deviation, no edema and no erythema present. No thyromegaly present.  Cardiovascular: Normal heart sounds, intact distal pulses and normal pulses.  An irregular rhythm present. Tachycardia present.  Exam reveals no gallop, no distant heart sounds and no friction rub.   No murmur heard. Pulmonary/Chest: Effort normal. No accessory muscle usage. No respiratory distress. She has decreased breath sounds in the right lower field and  the left lower field. She has no wheezes. She has no rhonchi. She has no rales. She exhibits no tenderness.  Abdominal: Soft. Normal appearance and bowel sounds are normal. She exhibits no distension and no ascites. There is no tenderness.  Musculoskeletal: Normal range of motion. She exhibits no edema or tenderness.  Expected osteoarthritis, stiffness  Neurological: She is alert and oriented to person, place, and time. She has normal strength.  Skin: Skin is warm, dry and intact. She is not diaphoretic. No cyanosis. No pallor. Nails show no clubbing.  Psychiatric: She has a normal mood and affect. Her speech is normal and behavior is normal. Judgment and thought content normal. Cognition and memory are normal.  Nursing note and vitals reviewed.   Labs reviewed:  Recent Labs  10/29/16 0504  10/31/16 0501 11/01/16 0351 11/06/16 1500  NA 136  < > 140 140 139  K 4.8  < > 4.1 3.9 4.2  CL 104  < > 102 100* 103  CO2 22  < > 28 30 26   GLUCOSE 108*  < > 101* 113* 113*  BUN 48*  < > 45* 55* 84*  CREATININE 1.95*  < > 1.74* 2.02* 1.83*  CALCIUM 8.1*  < > 8.7* 8.5* 8.8*  MG 2.2  --   --   --   --   PHOS 5.4*  --   --   --   --   < > = values in this interval not displayed.  Recent Labs  08/01/16 1420 10/28/16 2021 11/06/16 1500  AST 16 46* 20  ALT 9 17 16   ALKPHOS 64 70 53  BILITOT 0.3 0.4 0.6  PROT 7.6 7.7 6.7  ALBUMIN 4.0 3.8 3.4*    Recent Labs  08/01/16 1420 10/28/16 2021 10/29/16 0504 10/30/16 0321 11/06/16 1500  WBC 12.3* 11.0 5.9 5.5 15.9*  NEUTROABS 9.7* 7.0*  --   --  12.6*  HGB 10.2* 12.1 9.8* 9.9* 11.4*  HCT 30.9* 37.4 29.6* 30.1* 35.7  MCV 86.4 90.3 88.1 88.5 87.7  PLT 315.0 225 158 159 272   Lab Results  Component Value Date   TSH 1.74 08/28/2013   Lab Results  Component Value Date   HGBA1C 5.6 01/10/2016   Lab Results  Component Value Date   CHOL 135 01/10/2016   HDL 40.90 01/10/2016   LDLCALC 60 01/10/2016   LDLDIRECT 122.1 08/17/2010   TRIG  168.0 (H) 01/10/2016   CHOLHDL 3  01/10/2016    Significant Diagnostic Results in last 30 days:  Dg Chest 2 View  Result Date: 10/25/2016 CLINICAL DATA:  Previous pneumonia with persistent cough EXAM: CHEST  2 VIEW COMPARISON:  Chest radiograph August 01, 2016 ; chest CT November 07, 2015 FINDINGS: There is persistent patchy opacity with volume loss in the right middle lobe as well as ill-defined areas of opacity in the left mid and lower lung zones. No new opacity is evident. Heart is upper normal in size with pulmonary vascularity within normal limits. There is atherosclerotic calcification in the aorta. No evident adenopathy. There is degenerative change in the thoracic spine. There is lower thoracic levoscoliosis. IMPRESSION: Persistent areas of opacity in the left mid and lower lung zones as well as persistent consolidation with volume loss right middle lobe. No new opacity. Stable cardiac silhouette. There is aortic atherosclerosis. Given the persistence of the opacities, correlation with noncontrast enhanced chest CT is advised to further assess. These results will be called to the ordering clinician or representative by the Radiologist Assistant, and communication documented in the PACS or zVision Dashboard. Electronically Signed   By: Lowella Grip III M.D.   On: 10/25/2016 16:30   Dg Op Swallowing Func-medicare/speech Path  Result Date: 11/21/2016 CLINICAL DATA:  Increasing dysphagia following recent hospitalization EXAM: MODIFIED BARIUM SWALLOW TECHNIQUE: Different consistencies of barium were administered orally to the patient by the Speech Pathologist. Imaging of the pharynx was performed in the lateral projection. FLUOROSCOPY TIME:  Fluoroscopy Time:  1 minutes, 24 seconds Radiation Exposure Index (if provided by the fluoroscopic device): 143 micro Gy per meter square Number of Acquired Spot Images: 0 COMPARISON:  None. FINDINGS: Thin liquid- prompt silent laryngeal penetration. Nectar  thick liquid- no penetration observed. Pure- prompt penetration with residual. Pure with cracker- no aspiration observed. IMPRESSION: Considerable laryngeal penetration of thin liquid and the puree occurred without elicitation of a cough reflex. Please refer to the Speech Pathologists report for complete details and recommendations. Electronically Signed   By: David  Martinique M.D.   On: 11/21/2016 13:30   Dg Chest Port 1 View  Result Date: 10/29/2016 CLINICAL DATA:  83 year old female with history of pneumonia. EXAM: PORTABLE CHEST 1 VIEW COMPARISON:  Chest x-ray 10/28/2016. FINDINGS: Lung volumes are slightly low. Patchy ill-defined airspace opacities in the mid to lower lungs bilaterally appear slightly improved compared to the prior study. No pleural effusions. Crowding of the pulmonary vasculature, without frank pulmonary edema. Mild cardiomegaly. The patient is rotated to the right on today's exam, resulting in distortion of the mediastinal contours and reduced diagnostic sensitivity and specificity for mediastinal pathology. Atherosclerosis in the thoracic aorta. IMPRESSION: 1. Cardiomegaly with pulmonary venous congestion, without frank pulmonary edema. 2. Patchy mid to lower lung ill-defined opacities slightly improved, favored to reflect resolving multilobar bronchopneumonia. 3. Aortic atherosclerosis. Electronically Signed   By: Vinnie Langton M.D.   On: 10/29/2016 07:58   Dg Chest Portable 1 View  Result Date: 10/28/2016 CLINICAL DATA:  Shortness of breath EXAM: PORTABLE CHEST 1 VIEW COMPARISON:  10/25/2016, 08/01/2016, 10/14/2015 FINDINGS: Mild diffuse coarse interstitial opacities are likely chronic. Slight increased opacity in the right mid lung and left base, atelectasis versus mild infiltrate. Stable enlarged cardiomediastinal silhouette without overt failure. Apical pleural scarring. No pneumothorax. Atherosclerosis of the aorta. IMPRESSION: 1. Slight increased right mid lung zone opacity  with persistent vague opacity in the left mid lung zone and left base, findings could relate to multifocal infiltrates however nodules not excluded. Chest  CT correlation was previously recommended. Electronically Signed   By: Donavan Foil M.D.   On: 10/28/2016 20:45    Assessment/Plan 1. Influenza A  Tamiflu 75 mg po BID x 5 day  Increase po fluid intake  Symptom management  Droplet precautions until 48 hours after final symtoms  Family/ staff Communication:   Total Time:  Documentation:  Face to Face:  Family/Phone:   Labs/tests ordered:  Cbc, met c, flu swab, 2- view cxr  Medication list reviewed and assessed for continued appropriateness.  Vikki Ports, NP-C Geriatrics [facility] [phone], [place] [address] Cell Phone (Mon-Fri 8am-5pm):  [phone] On Call:  [phone] & follow prompts after 5pm & weekends Office Phone:  [phone] Office Fax:  [phone]

## 2016-11-27 DIAGNOSIS — R300 Dysuria: Secondary | ICD-10-CM | POA: Diagnosis not present

## 2016-11-27 LAB — URINALYSIS, COMPLETE (UACMP) WITH MICROSCOPIC
BACTERIA UA: NONE SEEN
BILIRUBIN URINE: NEGATIVE
Glucose, UA: NEGATIVE mg/dL
HGB URINE DIPSTICK: NEGATIVE
KETONES UR: NEGATIVE mg/dL
NITRITE: NEGATIVE
PROTEIN: 30 mg/dL — AB
Specific Gravity, Urine: 1.011 (ref 1.005–1.030)
pH: 5 (ref 5.0–8.0)

## 2016-11-29 ENCOUNTER — Other Ambulatory Visit: Payer: Self-pay | Admitting: Family Medicine

## 2016-11-29 ENCOUNTER — Non-Acute Institutional Stay (SKILLED_NURSING_FACILITY): Payer: Medicare Other | Admitting: Gerontology

## 2016-11-29 DIAGNOSIS — J189 Pneumonia, unspecified organism: Secondary | ICD-10-CM

## 2016-11-29 LAB — URINE CULTURE: Culture: NO GROWTH

## 2016-11-29 NOTE — Progress Notes (Signed)
Location:      Place of Service:  SNF (31)  Provider: Toni Arthurs, NP-C  PCP: Eliezer Lofts, MD Patient Care Team: Jinny Sanders, MD as PCP - General Minna Merritts, MD as Consulting Physician (Cardiology)  Extended Emergency Contact Information Primary Emergency Contact: Hepler,Sue Address: [address]          [place], [place] [address] [place] Phone: [phone] Mobile Phone: [phone] Relation: Daughter Secondary Emergency Contact: Janyth Contes [place] Phone: [phone] Relation: Daughter  Code Status: full Goals of care:  Advanced Directive information Advanced Directives 10/29/2016  Does Patient Have a Medical Advance Directive? No  Type of Advance Directive -  Does patient want to make changes to medical advance directive? -  Copy of [place] in Chart? -  Would patient like information on creating a medical advance directive? No - Patient declined     Allergies  Allergen Reactions  . Levofloxacin Other (See Comments)    REACTION: Burning in legs  . Penicillins Rash and Other (See Comments)    Patient unable to answer follow up questions at this time    Chief Complaint  Patient presents with  . Discharge Note    HPI:  83 y.o. female seen today for discharge evaluation. Pt was admitted to the facility for rehab following hospitalization for Bilateral lung PNA. Pt was experiencing weakness and deconditioning. Pt worked with PT/OT, advanced well. Ambulatory with a walker. Denies pain. Pt denies n/v/d/f/c/cp/sob/ha/abd pain/dizziness. While here, pt had a gout flare. She responded nicely to Prednisone and Colchicine. She also tested positive for Influenza A. She completed the treatment course of tamiflu. Pt reports she is now feling stronger and feels she is ready for discharge to the [facility]. VSS. No other complaints.     Past Medical History:  Diagnosis Date  . Allergic  rhinitis   . CKD (chronic kidney disease)   . COPD (chronic obstructive pulmonary disease) ([place])   . Diastolic CHF, acute (HCC)    echo (4/11) with EF 60-65%,mild LVH, mild aortic insufficiency, mild MR, PA systolic pressure 78-24 mmHg, mild to moderate TR  . Diverticulosis of colon   . GERD (gastroesophageal reflux disease)   . GI bleed   . H/O Clostridium difficile infection Sept. 2013  . History of PFTs   . Hyperlipidemia   . Hypertension    LE swelling with amlodipine, intolerant of ACEIs  . Impaired vision    right eye, was told due to HTN  . LBBB (left bundle branch block)   . Osteoarthritis     Past Surgical History:  Procedure Laterality Date  . CARDIAC CATHETERIZATION  1999   normal  . CERVICAL [place] SURGERY  2005   cerv. disc, dz. C5-C7, facet arthritis  . CHOLECYSTECTOMY  1980  . TOTAL ABDOMINAL HYSTERECTOMY  1970   suspicious cells      reports that she quit smoking about 32 years ago. Her smoking use included Cigarettes. She smoked 1.00 pack per day. She has never used smokeless tobacco. She reports that she drinks alcohol. She reports that she does not use drugs. Social History   Social History  . Marital status: Widowed    Spouse name: N/A  . Number of children: N/A  . Years of education: N/A   Occupational History  . Not on file.   Social History Main Topics  . Smoking status: Former Smoker    Packs/day: 1.00  Types: Cigarettes    Quit date: 10/15/1984  . Smokeless tobacco: Never Used  . Alcohol use 0.0 oz/week  . Drug use: No  . Sexual activity: Not on file   Other Topics Concern  . Not on file   Social History Narrative   No living will, HCPOA: daughter Wilburn Mylar   Functional Status Survey:    Allergies  Allergen Reactions  . Levofloxacin Other (See Comments)    REACTION: Burning in legs  . Penicillins Rash and Other (See Comments)    Patient unable to answer follow up questions at this time    Pertinent  Health Maintenance Due    Topic Date Due  . INFLUENZA VACCINE  Completed  . DEXA SCAN  Completed  . PNA vac Low Risk Adult  Completed    Medications: Allergies as of 11/29/2016      Reactions   Levofloxacin Other (See Comments)   REACTION: Burning in legs   Penicillins Rash, Other (See Comments)   Patient unable to answer follow up questions at this time      Medication List       Accurate as of 11/29/16  3:09 PM. Always use your most recent med list.          acetaminophen 500 MG tablet Commonly known as:  TYLENOL Take 500 mg by mouth every 6 (six) hours as needed for mild pain or fever.   ADVAIR DISKUS 250-50 MCG/DOSE Aepb Generic drug:  Fluticasone-Salmeterol INHALE 1 PUFF INTO THE  LUNGS TWO TIMES DAILY   aspirin 81 MG EC tablet Take 81 mg by mouth daily.   azithromycin 500 MG tablet Commonly known as:  ZITHROMAX Take 1 tablet (500 mg total) by mouth daily.   BYSTOLIC 10 MG tablet Generic drug:  nebivolol TAKE 1 TABLET BY MOUTH  DAILY   CULTURELLE PO Take 1 tablet by mouth 2 (two) times daily.   ferrous fumarate 325 (106 Fe) MG Tabs tablet Commonly known as:  HEMOCYTE - 106 mg FE Take 1 tablet (106 mg of iron total) by mouth daily.   fluticasone 50 MCG/ACT nasal spray Commonly known as:  FLONASE Place 2 sprays into both nostrils daily.   furosemide 20 MG tablet Commonly known as:  LASIX Take 1 tablet (20 mg total) by mouth every other day.   gabapentin 600 MG tablet Commonly known as:  NEURONTIN Take 1 tablet by mouth at  bedtime   lovastatin 20 MG tablet Commonly known as:  MEVACOR Take 1 tablet by mouth at  bedtime   omeprazole 40 MG capsule Commonly known as:  PRILOSEC TAKE 1 CAPSULE BY MOUTH  DAILY   predniSONE 50 MG tablet Commonly known as:  DELTASONE Take 1 tablet (50 mg total) by mouth daily with breakfast.   PROAIR HFA 108 (90 Base) MCG/ACT inhaler Generic drug:  albuterol INHALE 2 PUFFS EVERY 6  HOURS AS NEEDED FOR  WHEEZING OR SHORTNESS OF  BREATH.    SPIRIVA HANDIHALER 18 MCG inhalation capsule Generic drug:  tiotropium INHALE THE CONTENTS OF 1  CAPSULE VIA HANDIHALER  DAILY   traMADol 50 MG tablet Commonly known as:  ULTRAM Take 1 tablet (50 mg total) by mouth every 8 (eight) hours as needed for severe pain.   vitamin E 1000 UNIT capsule Take 1,000 Units by mouth daily.       Review of Systems  Constitutional: Negative for activity change, appetite change, chills, diaphoresis, fatigue and fever.  HENT: Negative for congestion, sinus pain,  sinus pressure, sneezing, sore throat, trouble swallowing and voice change.   Eyes: Negative for pain, redness and visual disturbance.  Respiratory: Negative for apnea, cough, choking, chest tightness, shortness of breath and wheezing.   Cardiovascular: Negative for chest pain, palpitations and leg swelling.  Gastrointestinal: Negative for abdominal distention, abdominal pain, constipation, diarrhea and nausea.  Genitourinary: Negative for difficulty urinating, dysuria, frequency and urgency.  Musculoskeletal: Positive for arthralgias and joint swelling. Negative for back pain, gait problem and myalgias.  Skin: Negative for color change, pallor, rash and wound.  Neurological: Negative for dizziness, tremors, syncope, speech difficulty, weakness, numbness and headaches.  Psychiatric/Behavioral: Negative for agitation and behavioral problems.  All other systems reviewed and are negative.   Vitals:   11/29/16 0600  BP: 125/74  Pulse: 75  Resp: 18  Temp: 98.1 F (36.7 C)  SpO2: 95%  Weight: 114 lb 4.8 oz (51.8 kg)   Body mass index is 20.91 kg/m. Physical Exam  Constitutional: She is oriented to person, place, and time. Vital signs are normal. She appears well-developed and well-nourished. She is active and cooperative. She does not appear ill. No distress.  HENT:  Head: Normocephalic and atraumatic.  Mouth/Throat: Uvula is midline, oropharynx is clear and moist and mucous membranes  are normal. Mucous membranes are not pale, not dry and not cyanotic.  Eyes: Conjunctivae, EOM and lids are normal. Pupils are equal, round, and reactive to light.  Neck: Trachea normal, normal range of motion and full passive range of motion without pain. Neck supple. No JVD present. No tracheal deviation, no edema and no erythema present. No thyromegaly present.  Cardiovascular: Normal heart sounds, intact distal pulses and normal pulses.  An irregular rhythm present. Tachycardia present.  Exam reveals no gallop, no distant heart sounds and no friction rub.   No murmur heard. Pulmonary/Chest: Effort normal. No accessory muscle usage. No respiratory distress. She has decreased breath sounds in the right lower field and the left lower field. She has no wheezes. She has no rhonchi. She has no rales. She exhibits no tenderness.  Abdominal: Soft. Normal appearance and bowel sounds are normal. She exhibits no distension and no ascites. There is no tenderness.  Musculoskeletal: Normal range of motion. She exhibits no edema or tenderness.  Expected osteoarthritis, stiffness  Neurological: She is alert and oriented to person, place, and time. She has normal strength.  Skin: Skin is warm, dry and intact. No rash noted. She is not diaphoretic. No cyanosis or erythema. No pallor. Nails show no clubbing.  Psychiatric: She has a normal mood and affect. Her speech is normal and behavior is normal. Judgment and thought content normal. Cognition and memory are normal.  Nursing note and vitals reviewed.   Labs reviewed: Basic Metabolic Panel:  Recent Labs  10/29/16 0504  10/31/16 0501 11/01/16 0351 11/06/16 1500  NA 136  < > 140 140 139  K 4.8  < > 4.1 3.9 4.2  CL 104  < > 102 100* 103  CO2 22  < > '28 30 26  '$ GLUCOSE 108*  < > 101* 113* 113*  BUN 48*  < > 45* 55* 84*  CREATININE 1.95*  < > 1.74* 2.02* 1.83*  CALCIUM 8.1*  < > 8.7* 8.5* 8.8*  MG 2.2  --   --   --   --   PHOS 5.4*  --   --   --   --     < > = values in this interval not displayed.  Liver Function Tests:  Recent Labs  08/01/16 1420 10/28/16 2021 11/06/16 1500  AST 16 46* 20  ALT '9 17 16  '$ ALKPHOS 64 70 53  BILITOT 0.3 0.4 0.6  PROT 7.6 7.7 6.7  ALBUMIN 4.0 3.8 3.4*    Recent Labs  01/12/16 0022  LIPASE 18   No results for input(s): AMMONIA in the last 8760 hours. CBC:  Recent Labs  08/01/16 1420 10/28/16 2021 10/29/16 0504 10/30/16 0321 11/06/16 1500  WBC 12.3* 11.0 5.9 5.5 15.9*  NEUTROABS 9.7* 7.0*  --   --  12.6*  HGB 10.2* 12.1 9.8* 9.9* 11.4*  HCT 30.9* 37.4 29.6* 30.1* 35.7  MCV 86.4 90.3 88.1 88.5 87.7  PLT 315.0 225 158 159 272   Cardiac Enzymes:  Recent Labs  01/12/16 0022 10/28/16 2021  TROPONINI <0.03 0.03*   BNP: Invalid input(s): POCBNP CBG:  Recent Labs  10/28/16 2350  GLUCAP 204*    Procedures and Imaging Studies During Stay: Dg Op Swallowing Func-medicare/speech Path  Result Date: 11/21/2016 CLINICAL DATA:  Increasing dysphagia following recent hospitalization EXAM: MODIFIED BARIUM SWALLOW TECHNIQUE: Different consistencies of barium were administered orally to the patient by the Speech Pathologist. Imaging of the pharynx was performed in the lateral projection. FLUOROSCOPY TIME:  Fluoroscopy Time:  1 minutes, 24 seconds Radiation Exposure Index (if provided by the fluoroscopic device): 143 micro Gy per meter square Number of Acquired Spot Images: 0 COMPARISON:  None. FINDINGS: Thin liquid- prompt silent laryngeal penetration. Nectar thick liquid- no penetration observed. Pure- prompt penetration with residual. Pure with cracker- no aspiration observed. IMPRESSION: Considerable laryngeal penetration of thin liquid and the puree occurred without elicitation of a cough reflex. Please refer to the Speech Pathologists report for complete details and recommendations. Electronically Signed   By: David  Martinique M.D.   On: 11/21/2016 13:30    Assessment/Plan:   1. Pneumonia of  both lungs due to infectious organism, unspecified part of lung  Continue PT/OT  Continue exercises as taught by PT/OT  Antibiotic completed  Ambulate with walker  Tramadol 50 mg 1 tablet po Q 8 hours for severe pain  Follow up with pcp asap for continuity of care   Patient is being discharged with the following home health services:  HHPT/OT  Patient is being discharged with the following durable medical equipment:  No DME  Patient has been advised to f/u with their PCP in 1-2 weeks to bring them up to date on their rehab stay.  Social services at facility was responsible for arranging this appointment.  Pt was provided with a 30 day supply of prescriptions for medications and refills must be obtained from their PCP.  For controlled substances, a more limited supply may be provided adequate until PCP appointment only.  Future labs/tests needed:    Family/ staff Communication:   Total Time:  Documentation:  Face to Face:  Family/Phone:  Vikki Ports, NP-C [facility] [address], [place] [address] Cell Phone (Mon-Fri 8am-5pm):  [phone] On Call:  [phone] & follow prompts after 5pm & weekends Office Phone:  [phone] Office Fax:  [phone]

## 2016-11-30 ENCOUNTER — Telehealth: Payer: Self-pay | Admitting: Cardiovascular Disease

## 2016-11-30 NOTE — Telephone Encounter (Signed)
Spoke with patients daughter per release form and she states that her mother had been in rehab and they discontinued a bunch of her medications. She states that she is on the bystolic but nothing else. Today she reports that her blood pressure was 200/90's and wanted to know what they should do and if she should be started back on her previous blood pressure medications. Instructed her to start monitoring her blood pressures twice a day and keep a log of those numbers and remain on the bystolic as prescribed. Instructed her to take hydralazine if needed for blood pressures greater than 140/80 up to two or three times a day. Patient is scheduled to come in next Thursday to see Dr. Rockey Situ and let her know to bring list of those readings with her so we can see what possible changes need to be made. She verbalized understanding of our conversation, agreement with plan, and had no further questions. Let her know to call back if blood pressure remains elevated.

## 2016-11-30 NOTE — Telephone Encounter (Signed)
Pt daughter calling stating  [facility] she was in rehab They took her off some of her BP medications  Amlodipine and Hydralazine  Would like to know if it is okay to start taking them again Please advise

## 2016-11-30 NOTE — Telephone Encounter (Signed)
Left voicemail message to call back  

## 2016-12-04 DIAGNOSIS — R531 Weakness: Secondary | ICD-10-CM | POA: Diagnosis not present

## 2016-12-04 DIAGNOSIS — I5032 Chronic diastolic (congestive) heart failure: Secondary | ICD-10-CM | POA: Diagnosis not present

## 2016-12-04 DIAGNOSIS — R1312 Dysphagia, oropharyngeal phase: Secondary | ICD-10-CM | POA: Diagnosis not present

## 2016-12-04 DIAGNOSIS — D631 Anemia in chronic kidney disease: Secondary | ICD-10-CM | POA: Diagnosis not present

## 2016-12-04 DIAGNOSIS — N182 Chronic kidney disease, stage 2 (mild): Secondary | ICD-10-CM | POA: Diagnosis not present

## 2016-12-04 DIAGNOSIS — I130 Hypertensive heart and chronic kidney disease with heart failure and stage 1 through stage 4 chronic kidney disease, or unspecified chronic kidney disease: Secondary | ICD-10-CM | POA: Diagnosis not present

## 2016-12-04 DIAGNOSIS — Z79891 Long term (current) use of opiate analgesic: Secondary | ICD-10-CM | POA: Diagnosis not present

## 2016-12-04 DIAGNOSIS — E785 Hyperlipidemia, unspecified: Secondary | ICD-10-CM | POA: Diagnosis not present

## 2016-12-04 DIAGNOSIS — J449 Chronic obstructive pulmonary disease, unspecified: Secondary | ICD-10-CM | POA: Diagnosis not present

## 2016-12-04 DIAGNOSIS — Z87891 Personal history of nicotine dependence: Secondary | ICD-10-CM | POA: Diagnosis not present

## 2016-12-04 DIAGNOSIS — M1991 Primary osteoarthritis, unspecified site: Secondary | ICD-10-CM | POA: Diagnosis not present

## 2016-12-04 DIAGNOSIS — H5461 Unqualified visual loss, right eye, normal vision left eye: Secondary | ICD-10-CM | POA: Diagnosis not present

## 2016-12-06 ENCOUNTER — Ambulatory Visit (INDEPENDENT_AMBULATORY_CARE_PROVIDER_SITE_OTHER): Payer: Medicare Other | Admitting: Cardiovascular Disease

## 2016-12-06 VITALS — BP 120/60 | HR 66 | Ht 61.0 in | Wt 122.5 lb

## 2016-12-06 DIAGNOSIS — Z79891 Long term (current) use of opiate analgesic: Secondary | ICD-10-CM | POA: Diagnosis not present

## 2016-12-06 DIAGNOSIS — I872 Venous insufficiency (chronic) (peripheral): Secondary | ICD-10-CM

## 2016-12-06 DIAGNOSIS — M1991 Primary osteoarthritis, unspecified site: Secondary | ICD-10-CM | POA: Diagnosis not present

## 2016-12-06 DIAGNOSIS — E7800 Pure hypercholesterolemia, unspecified: Secondary | ICD-10-CM | POA: Diagnosis not present

## 2016-12-06 DIAGNOSIS — Z87891 Personal history of nicotine dependence: Secondary | ICD-10-CM | POA: Diagnosis not present

## 2016-12-06 DIAGNOSIS — N184 Chronic kidney disease, stage 4 (severe): Secondary | ICD-10-CM

## 2016-12-06 DIAGNOSIS — J449 Chronic obstructive pulmonary disease, unspecified: Secondary | ICD-10-CM | POA: Diagnosis not present

## 2016-12-06 DIAGNOSIS — I5032 Chronic diastolic (congestive) heart failure: Secondary | ICD-10-CM | POA: Diagnosis not present

## 2016-12-06 DIAGNOSIS — E785 Hyperlipidemia, unspecified: Secondary | ICD-10-CM | POA: Diagnosis not present

## 2016-12-06 DIAGNOSIS — N182 Chronic kidney disease, stage 2 (mild): Secondary | ICD-10-CM | POA: Diagnosis not present

## 2016-12-06 DIAGNOSIS — I130 Hypertensive heart and chronic kidney disease with heart failure and stage 1 through stage 4 chronic kidney disease, or unspecified chronic kidney disease: Secondary | ICD-10-CM | POA: Diagnosis not present

## 2016-12-06 DIAGNOSIS — I10 Essential (primary) hypertension: Secondary | ICD-10-CM

## 2016-12-06 DIAGNOSIS — R1312 Dysphagia, oropharyngeal phase: Secondary | ICD-10-CM | POA: Diagnosis not present

## 2016-12-06 DIAGNOSIS — D631 Anemia in chronic kidney disease: Secondary | ICD-10-CM | POA: Diagnosis not present

## 2016-12-06 DIAGNOSIS — M7989 Other specified soft tissue disorders: Secondary | ICD-10-CM

## 2016-12-06 DIAGNOSIS — R531 Weakness: Secondary | ICD-10-CM | POA: Diagnosis not present

## 2016-12-06 DIAGNOSIS — H5461 Unqualified visual loss, right eye, normal vision left eye: Secondary | ICD-10-CM | POA: Diagnosis not present

## 2016-12-06 MED ORDER — FUROSEMIDE 20 MG PO TABS: 20.0000 mg | ORAL_TABLET | Freq: Every day | ORAL | 3 refills | Status: DC

## 2016-12-06 MED ORDER — HYDRALAZINE HCL 50 MG PO TABS: 50.0000 mg | ORAL_TABLET | Freq: Three times a day (TID) | ORAL | 3 refills | Status: DC

## 2016-12-06 NOTE — Progress Notes (Signed)
Cardiology Office Note  Date:  12/06/2016   ID:  Elizabeth Silva, DOB [birth year], MRN [id number]  PCP:  Eliezer Lofts, MD   Chief Complaint  Patient presents with  . other    12 mo f/u and discuss recent hospital stay.  Echo completed 10/30/15. Pt c/o right leg swelling, SOB if she walks too long.  Meds reviewed verbally with patient's daughter    HPI:  83 yo with history of malignant HTN, COPD, hyperlipidemia, left bundle-branch block, moderate tricuspid valve regurgitation since 2011 with normal ejection fraction in 2012, presents today for routine followup of her hypertension, Leg swelling Ophthalmologist feels her vision loss is secondary to atherosclerotic disease and hypertension. vision loss in her left eye. 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 History of carotid arterial disease  In follow-up today she's had a difficult 2 months Recent hospitalization January 2018 Discharge 11/02/2016. Hospital records reviewed Presenting with shortness of breath, respiratory failure She presented from nursing home with shortness of breath symptoms, respiratory infection,  started on antibiotics and inhalers an outpatient but symptoms got worse, hypoxic with saturations in the 80s, started on BiPAP in the hospital Diagnosed with multilobar pneumonia, long hospital course  Went to [facility], 21 days of rehabilitation, left 11/27/16 Reports that she got flu infection while she was in rehabilitation Now reports that she is back at the [facility] home Still weak, walks with assistance  Since she left the nursing home/[facility] weight is up 8 pounds Daughter does her medications, has not been giving her Lasix Daughter felt her breathing was okay now with worsening lower extremity edema, worse on the right than the left.   She reports some dysphagia,  using thickeners at home to avoid aspiration  EKG on today's visit shows normal sinus rhythm with rate 66 bpm, left bundle branch  block  Other past medical history reviewed  Previous CT scan of her chest for possible pneumonia shows diffuse aortic atherosclerosis, coronary artery disease noted in the proximal LAD  Vision continues to cause major issues, some progression  She does report having myalgias on simvastatin in the past  Chronic fatigue/poor energy poor several years. She's not using her cane.  Previous hospital admission for sepsis, recurrent pneumonia, diagnosed with C. Difficile.  treated with Flagyl and diarrhea improved. Notes from the hospital indicate Klebsiella urinary tract infection treated with antibiotics, C. difficile improving on Flagyl, bradycardia, recurrent pneumonia that was improving on antibiotics, sepsis that was treated and improved, chronic kidney disease.    Previous  Echo was suggestive of diastolic dysfunction with normal EF (60-65%). Moderate TR      PMH:   has a past medical history of Allergic rhinitis; CKD (chronic kidney disease); COPD (chronic obstructive pulmonary disease) ([place]); Diastolic CHF, acute ([facility]); Diverticulosis of colon; GERD (gastroesophageal reflux disease); GI bleed; H/O Clostridium difficile infection (Sept. 2013); History of PFTs; Hyperlipidemia; Hypertension; Impaired vision; LBBB (left bundle branch block); and Osteoarthritis.  PSH:    Past Surgical History:  Procedure Laterality Date  . CARDIAC CATHETERIZATION  1999   normal  . CERVICAL [place] SURGERY  2005   cerv. disc, dz. C5-C7, facet arthritis  . CHOLECYSTECTOMY  1980  . TOTAL ABDOMINAL HYSTERECTOMY  1970   suspicious cells    Current Outpatient Prescriptions  Medication Sig Dispense Refill  . acetaminophen (TYLENOL) 500 MG tablet Take 500 mg by mouth every 6 (six) hours as needed for mild pain or fever.     Marland Kitchen ADVAIR DISKUS 250-50  MCG/DOSE AEPB INHALE 1 PUFF INTO THE  LUNGS TWO TIMES DAILY 180 each 3  . aspirin 81 MG EC tablet Take 81 mg by mouth daily.      Marland Kitchen BYSTOLIC 10 MG tablet TAKE 1  TABLET BY MOUTH  DAILY 90 tablet 1  . ferrous fumarate (HEMOCYTE - 106 MG FE) 325 (106 FE) MG TABS tablet Take 1 tablet (106 mg of iron total) by mouth daily. 90 each 3  . fluticasone (FLONASE) 50 MCG/ACT nasal spray Place 2 sprays into both nostrils daily. 48 g 11  . gabapentin (NEURONTIN) 600 MG tablet Take 1 tablet by mouth at  bedtime 90 tablet 3  . Lactobacillus Rhamnosus, GG, (CULTURELLE PO) Take 1 tablet by mouth 2 (two) times daily.    Marland Kitchen lovastatin (MEVACOR) 20 MG tablet Take 1 tablet by mouth at  bedtime 90 tablet 3  . omeprazole (PRILOSEC) 40 MG capsule TAKE 1 CAPSULE BY MOUTH  DAILY 90 capsule 1  . PROAIR HFA 108 (90 Base) MCG/ACT inhaler INHALE 2 PUFFS EVERY 6  HOURS AS NEEDED FOR  WHEEZING OR SHORTNESS OF  BREATH. 34 g 2  . SPIRIVA HANDIHALER 18 MCG inhalation capsule INHALE THE CONTENTS OF 1  CAPSULE VIA HANDIHALER  DAILY 90 capsule 3  . traMADol (ULTRAM) 50 MG tablet Take 1 tablet (50 mg total) by mouth every 8 (eight) hours as needed for severe pain. 20 tablet 0  . vitamin E 1000 UNIT capsule Take 1,000 Units by mouth daily.      . furosemide (LASIX) 20 MG tablet Take 1 tablet (20 mg total) by mouth daily. 90 tablet 3  . hydrALAZINE (APRESOLINE) 50 MG tablet Take 1 tablet (50 mg total) by mouth 3 (three) times daily. 270 tablet 3   No current facility-administered medications for this visit.      Allergies:   Levofloxacin and Penicillins   Social History:  The patient  reports that she quit smoking about 32 years ago. Her smoking use included Cigarettes. She smoked 1.00 pack per day. She has never used smokeless tobacco. She reports that she drinks alcohol. She reports that she does not use drugs.   Family History:   family history includes Cancer in her brother; Colon cancer in her sister; Dementia in her father and sister; Diabetes in her brother; Heart block in her mother; Lung cancer in her brother.    Review of Systems: Review of Systems  Constitutional: Positive for  malaise/fatigue.  Respiratory: Negative.   Cardiovascular: Positive for leg swelling.  Gastrointestinal: Negative.   Musculoskeletal: Negative.        Unsteady gait  Neurological: Positive for weakness.  Psychiatric/Behavioral: Negative.   All other systems reviewed and are negative.    PHYSICAL EXAM: VS:  BP 120/60 (BP Location: Left Arm, Patient Position: Sitting, Cuff Size: Normal)   Pulse 66   Ht '5\' 1"'$  (1.549 m)   Wt 122 lb 8 oz (55.6 kg)   BMI 23.15 kg/m  , BMI Body mass index is 23.15 kg/m. GEN: Frail, thin in no acute distress  HEENT: normal  Neck: no JVD, carotid bruits, or masses Cardiac: RRR; no murmurs, rubs, or gallops, right greater than left lower extremity edema , 1+ to the mid shin right lower extremity, trace around the left ankle Respiratory:  clear to auscultation bilaterally, normal work of breathing GI: soft, nontender, nondistended, + BS MS: no deformity or atrophy  Skin: warm and dry, no rash Neuro:  Strength and sensation are  intact Psych: euthymic mood, full affect  Recent Labs: 10/28/2016: B Natriuretic Peptide 1,008.0 10/29/2016: Magnesium 2.2 11/06/2016: ALT 16; BUN 84; Creatinine, Ser 1.83; Hemoglobin 11.4; Platelets 272; Potassium 4.2; Sodium 139    Lipid Panel Lab Results  Component Value Date   CHOL 135 01/10/2016   HDL 40.90 01/10/2016   LDLCALC 60 01/10/2016   TRIG 168.0 (H) 01/10/2016      Wt Readings from Last 3 Encounters:  12/06/16 122 lb 8 oz (55.6 kg)  11/29/16 114 lb 4.8 oz (51.8 kg)  11/09/16 112 lb 11.2 oz (51.1 kg)       ASSESSMENT AND PLAN:  HYPERCHOLESTEROLEMIA, PURE - Plan: EKG 12-Lead Cholesterol is at goal on the current lipid regimen. No changes to the medications were made.  Essential hypertension, benign - Plan: EKG 12-Lead Recommended she start hydralazine 25 up to 50 mg 3 times a day She provides blood pressure numbers on today's visit, typically running 803 up to 212 systolic She was previously on 100  mg 3 times a day, she will cut the pill in half with close monitoring of blood pressure.  DIASTOLIC HEART FAILURE, CHRONIC - Plan: EKG 12-Lead Worsening swelling right greater than left lower extremity. Etiology likely multifactorial though concerning for fluid retention given 8 pound weight gain since she left rehabilitation/[facility]. She was on Las[facility] at [facility], daughter stopped this on her own Recommended we restart Lasix 20 mg daily  Chronic venous insufficiency Recommended she restart using her compression hose. Order placed to have this done at the homeplace   Leg swelling If symptoms get worse and no improvement with Lasix and compression hose , we may need to send her to the wound clinic for a wrap   Chronic kidney disease, stage 4, severely decreased GFR (HCC) Creatinine 1.8, around her baseline Stable over the past year   Total encounter time more than 25 minutes  Greater than 50% was spent in counseling and coordination of care with the patient   Disposition:   F/U  6 months   Orders Placed This Encounter  Procedures  . EKG 12-Lead     Signed, Esmond Plants, M.D., Ph.D. 12/06/2016  [place], [place]

## 2016-12-06 NOTE — Patient Instructions (Addendum)
Please place compression hose every morning Please take compression hose off before bed   Medication Instructions:   Please start hydralazine 50 mg three times a day  Please restart lasix 20 mg daily in the morning  Labwork:  No new labs needed  Testing/Procedures:  No further testing at this time   I recommend watching educational videos on topics of interest to you at:       [email or web address]  Enter code: [email or web address]    Follow-Up: It was a pleasure seeing you in the office today. Please call us if you have new issues that need to be addressed before your next appt.  [phone]  Your physician wants you to follow-up in: 3 months.  You will receive a reminder letter in the mail two months in advance. If you don't receive a letter, please call our office to schedule the follow-up appointment.  If you need a refill on your cardiac medications before your next appointment, please call your pharmacy.

## 2016-12-07 ENCOUNTER — Other Ambulatory Visit: Payer: Self-pay

## 2016-12-07 ENCOUNTER — Telehealth: Payer: Self-pay | Admitting: Cardiovascular Disease

## 2016-12-07 MED ORDER — HYDRALAZINE HCL 25 MG PO TABS: 25.0000 mg | ORAL_TABLET | Freq: Three times a day (TID) | ORAL | 3 refills | Status: DC

## 2016-12-07 NOTE — Telephone Encounter (Signed)
Will You Please Advise.

## 2016-12-07 NOTE — Telephone Encounter (Signed)
Per discussion yesterday, sent in hydralazine 25 mg TID.

## 2016-12-07 NOTE — Telephone Encounter (Signed)
Pt c/o medication issue:  1. Name of Medication: Hydralazine   2. How are you currently taking this medication (dosage and times per day)? 3 times a day 100 mg   3. Are you having a reaction (difficulty breathing--STAT)? Dizzy   4. What is your medication issue? Would like to see if we can send in a lower dose for they think it is a bit strong Please advise

## 2016-12-11 ENCOUNTER — Encounter: Payer: Self-pay | Admitting: Family Medicine

## 2016-12-11 ENCOUNTER — Ambulatory Visit (INDEPENDENT_AMBULATORY_CARE_PROVIDER_SITE_OTHER): Payer: Medicare Other | Admitting: Family Medicine

## 2016-12-11 VITALS — BP 140/40 | HR 68 | Temp 98.5°F | Ht 61.0 in | Wt 121.8 lb

## 2016-12-11 DIAGNOSIS — Z79891 Long term (current) use of opiate analgesic: Secondary | ICD-10-CM | POA: Diagnosis not present

## 2016-12-11 DIAGNOSIS — E785 Hyperlipidemia, unspecified: Secondary | ICD-10-CM | POA: Diagnosis not present

## 2016-12-11 DIAGNOSIS — J189 Pneumonia, unspecified organism: Secondary | ICD-10-CM | POA: Diagnosis not present

## 2016-12-11 DIAGNOSIS — M1991 Primary osteoarthritis, unspecified site: Secondary | ICD-10-CM | POA: Diagnosis not present

## 2016-12-11 DIAGNOSIS — Z87891 Personal history of nicotine dependence: Secondary | ICD-10-CM | POA: Diagnosis not present

## 2016-12-11 DIAGNOSIS — I5023 Acute on chronic systolic (congestive) heart failure: Secondary | ICD-10-CM | POA: Diagnosis not present

## 2016-12-11 DIAGNOSIS — H5461 Unqualified visual loss, right eye, normal vision left eye: Secondary | ICD-10-CM | POA: Diagnosis not present

## 2016-12-11 DIAGNOSIS — R609 Edema, unspecified: Secondary | ICD-10-CM | POA: Diagnosis not present

## 2016-12-11 DIAGNOSIS — R1310 Dysphagia, unspecified: Secondary | ICD-10-CM | POA: Diagnosis not present

## 2016-12-11 DIAGNOSIS — R531 Weakness: Secondary | ICD-10-CM | POA: Diagnosis not present

## 2016-12-11 DIAGNOSIS — R1312 Dysphagia, oropharyngeal phase: Secondary | ICD-10-CM | POA: Diagnosis not present

## 2016-12-11 DIAGNOSIS — I130 Hypertensive heart and chronic kidney disease with heart failure and stage 1 through stage 4 chronic kidney disease, or unspecified chronic kidney disease: Secondary | ICD-10-CM | POA: Diagnosis not present

## 2016-12-11 DIAGNOSIS — N182 Chronic kidney disease, stage 2 (mild): Secondary | ICD-10-CM | POA: Diagnosis not present

## 2016-12-11 DIAGNOSIS — D631 Anemia in chronic kidney disease: Secondary | ICD-10-CM | POA: Diagnosis not present

## 2016-12-11 DIAGNOSIS — J449 Chronic obstructive pulmonary disease, unspecified: Secondary | ICD-10-CM | POA: Diagnosis not present

## 2016-12-11 DIAGNOSIS — I5032 Chronic diastolic (congestive) heart failure: Secondary | ICD-10-CM | POA: Diagnosis not present

## 2016-12-11 NOTE — Progress Notes (Signed)
Pre visit review using our clinic review tool, if applicable. No additional management support is needed unless otherwise documented below in the visit note. 

## 2016-12-11 NOTE — Patient Instructions (Addendum)
Please stop at the lab to set up to have labs drawn.  Continue lasix 20 mg daily for now.  Follow BP at home.. Especially if dizzy. Continue compression hose.  Drink water when thirsty, but small amounts of thickened liquids otherwise to help diarrhea SE.

## 2016-12-11 NOTE — Progress Notes (Signed)
Subjective:    Patient ID: Elizabeth Silva, female    DOB: [birth year], 83 y.o.   MRN: [id number]  HPI   83 year old female presents for follow up PNA/ COPD Hospitalization.  10/28/2016- 11/02/2016  Treated for bilateral lung pneumonia, CHF. IV antibiotics, Azithro, lasix  Went to rehab for weakness and deconditioning. Caught flu while she was there. Treated with prednisone and tamiflu. Discharge 2/13.   Swallow study performed in rehab.. Recommended thickening agents. She stopped the thickening agents.   She reports today cough improved, no production stable at baseline for shortness. No fever. Some diarrhea with thickened liquids.. Better drinking less of this.   Saw Cardiology last week..  Weight up 8 lbs, swelling in ankles. Stopped amlodipine, started back on hydralazine and lasix.   Since on lasix and compression hose.Marland Kitchen Swelling in left leg improved.   BP Readings from Last 3 Encounters:  12/11/16 (!) 140/40  12/06/16 120/60  11/29/16 125/74   Wt Readings from Last 3 Encounters:  12/11/16 121 lb 12 oz (55.2 kg)  12/06/16 122 lb 8 oz (55.6 kg)  11/29/16 114 lb 4.8 oz (51.8 kg)   Blood pressure (!) 140/40, pulse 68, temperature 98.5 F (36.9 C), temperature source Oral, height '5\' 1"'$  (1.549 m), weight 121 lb 12 oz (55.2 kg), SpO2 92 %.     Review of Systems  Constitutional: Negative for fatigue and fever.  HENT: Negative for congestion.   Eyes: Negative for pain.  Respiratory: Positive for cough and shortness of breath.   Cardiovascular: Positive for leg swelling. Negative for chest pain and palpitations.  Gastrointestinal: Negative for abdominal pain.  Genitourinary: Negative for dysuria and vaginal bleeding.  Musculoskeletal: Negative for back pain.  Neurological: Negative for syncope, light-headedness and headaches.  Psychiatric/Behavioral: Negative for dysphoric mood.       Objective:   Physical Exam  Constitutional: Vital signs are normal. She appears  well-developed and well-nourished. She is cooperative.  Non-toxic appearance. She does not appear ill. No distress.  HENT:  Head: Normocephalic.  Right Ear: Hearing, tympanic membrane, external ear and ear canal normal. Tympanic membrane is not erythematous, not retracted and not bulging.  Left Ear: Hearing, tympanic membrane, external ear and ear canal normal. Tympanic membrane is not erythematous, not retracted and not bulging.  Nose: No mucosal edema or rhinorrhea. Right sinus exhibits no maxillary sinus tenderness and no frontal sinus tenderness. Left sinus exhibits no maxillary sinus tenderness and no frontal sinus tenderness.  Mouth/Throat: Uvula is midline, oropharynx is clear and moist and mucous membranes are normal.  Eyes: Conjunctivae, EOM and lids are normal. Pupils are equal, round, and reactive to light. Lids are everted and swept, no foreign bodies found.  Neck: Trachea normal and normal range of motion. Neck supple. Carotid bruit is not present. No thyroid mass and no thyromegaly present.  Cardiovascular: Normal rate, regular rhythm, S1 normal, S2 normal, normal heart sounds, intact distal pulses and normal pulses.  Exam reveals no gallop and no friction rub.   No murmur heard. 1 plus swelling R> L  Pulmonary/Chest: Effort normal. No tachypnea. No respiratory distress. She has decreased breath sounds. She has no wheezes. She has no rhonchi. She has no rales.  Abdominal: Soft. Normal appearance and bowel sounds are normal. There is no tenderness.  Neurological: She is alert.  Skin: Skin is warm, dry and intact. No rash noted.  Psychiatric: Her speech is normal and behavior is normal. Judgment and thought content normal. Her mood  appears not anxious. Cognition and memory are normal. She does not exhibit a depressed mood.          Assessment & Plan:

## 2016-12-12 DIAGNOSIS — I130 Hypertensive heart and chronic kidney disease with heart failure and stage 1 through stage 4 chronic kidney disease, or unspecified chronic kidney disease: Secondary | ICD-10-CM | POA: Diagnosis not present

## 2016-12-12 DIAGNOSIS — R1312 Dysphagia, oropharyngeal phase: Secondary | ICD-10-CM | POA: Diagnosis not present

## 2016-12-12 DIAGNOSIS — R531 Weakness: Secondary | ICD-10-CM | POA: Diagnosis not present

## 2016-12-12 DIAGNOSIS — Z79891 Long term (current) use of opiate analgesic: Secondary | ICD-10-CM | POA: Diagnosis not present

## 2016-12-12 DIAGNOSIS — Z87891 Personal history of nicotine dependence: Secondary | ICD-10-CM | POA: Diagnosis not present

## 2016-12-12 DIAGNOSIS — D631 Anemia in chronic kidney disease: Secondary | ICD-10-CM | POA: Diagnosis not present

## 2016-12-12 DIAGNOSIS — N182 Chronic kidney disease, stage 2 (mild): Secondary | ICD-10-CM | POA: Diagnosis not present

## 2016-12-12 DIAGNOSIS — J449 Chronic obstructive pulmonary disease, unspecified: Secondary | ICD-10-CM | POA: Diagnosis not present

## 2016-12-12 DIAGNOSIS — E785 Hyperlipidemia, unspecified: Secondary | ICD-10-CM | POA: Diagnosis not present

## 2016-12-12 DIAGNOSIS — I5032 Chronic diastolic (congestive) heart failure: Secondary | ICD-10-CM | POA: Diagnosis not present

## 2016-12-12 DIAGNOSIS — M1991 Primary osteoarthritis, unspecified site: Secondary | ICD-10-CM | POA: Diagnosis not present

## 2016-12-12 DIAGNOSIS — H5461 Unqualified visual loss, right eye, normal vision left eye: Secondary | ICD-10-CM | POA: Diagnosis not present

## 2016-12-12 LAB — BASIC METABOLIC PANEL
BUN: 29 mg/dL — AB (ref 6–23)
CO2: 25 mEq/L (ref 19–32)
CREATININE: 1.97 mg/dL — AB (ref 0.40–1.20)
Calcium: 9 mg/dL (ref 8.4–10.5)
Chloride: 109 mEq/L (ref 96–112)
GFR: 24.93 mL/min — AB (ref >60.00)
Glucose, Bld: 126 mg/dL — ABNORMAL HIGH (ref 70–99)
POTASSIUM: 4.9 meq/L (ref 3.5–5.1)
Sodium: 143 mEq/L (ref 135–145)

## 2016-12-12 NOTE — Assessment & Plan Note (Signed)
Improved with antibiotics, now back at baseline SOB and daily intermittent cough.

## 2016-12-12 NOTE — Assessment & Plan Note (Signed)
Now improved after diuresis. Cardiology has her on diuretic but she is uncomfortable remaining on these long term. She is afraid of overdiuresis. We will reassess BMET on this current dose.and she will follow weights to determine baseline weight. Once reaches that we can consider using lasix prn.

## 2016-12-12 NOTE — Assessment & Plan Note (Signed)
ON thickened liquids. Discussed with pt how to tolerate this/avoid SE she feels are associated.

## 2016-12-17 DIAGNOSIS — J449 Chronic obstructive pulmonary disease, unspecified: Secondary | ICD-10-CM | POA: Diagnosis not present

## 2016-12-17 DIAGNOSIS — M1991 Primary osteoarthritis, unspecified site: Secondary | ICD-10-CM | POA: Diagnosis not present

## 2016-12-17 DIAGNOSIS — R1312 Dysphagia, oropharyngeal phase: Secondary | ICD-10-CM | POA: Diagnosis not present

## 2016-12-17 DIAGNOSIS — Z79891 Long term (current) use of opiate analgesic: Secondary | ICD-10-CM | POA: Diagnosis not present

## 2016-12-17 DIAGNOSIS — N182 Chronic kidney disease, stage 2 (mild): Secondary | ICD-10-CM | POA: Diagnosis not present

## 2016-12-17 DIAGNOSIS — D631 Anemia in chronic kidney disease: Secondary | ICD-10-CM | POA: Diagnosis not present

## 2016-12-17 DIAGNOSIS — I5032 Chronic diastolic (congestive) heart failure: Secondary | ICD-10-CM | POA: Diagnosis not present

## 2016-12-17 DIAGNOSIS — E785 Hyperlipidemia, unspecified: Secondary | ICD-10-CM | POA: Diagnosis not present

## 2016-12-17 DIAGNOSIS — Z87891 Personal history of nicotine dependence: Secondary | ICD-10-CM | POA: Diagnosis not present

## 2016-12-17 DIAGNOSIS — R531 Weakness: Secondary | ICD-10-CM | POA: Diagnosis not present

## 2016-12-17 DIAGNOSIS — H5461 Unqualified visual loss, right eye, normal vision left eye: Secondary | ICD-10-CM | POA: Diagnosis not present

## 2016-12-17 DIAGNOSIS — I130 Hypertensive heart and chronic kidney disease with heart failure and stage 1 through stage 4 chronic kidney disease, or unspecified chronic kidney disease: Secondary | ICD-10-CM | POA: Diagnosis not present

## 2016-12-18 DIAGNOSIS — R1312 Dysphagia, oropharyngeal phase: Secondary | ICD-10-CM | POA: Diagnosis not present

## 2016-12-18 DIAGNOSIS — H5461 Unqualified visual loss, right eye, normal vision left eye: Secondary | ICD-10-CM | POA: Diagnosis not present

## 2016-12-18 DIAGNOSIS — J449 Chronic obstructive pulmonary disease, unspecified: Secondary | ICD-10-CM | POA: Diagnosis not present

## 2016-12-18 DIAGNOSIS — Z87891 Personal history of nicotine dependence: Secondary | ICD-10-CM | POA: Diagnosis not present

## 2016-12-18 DIAGNOSIS — R531 Weakness: Secondary | ICD-10-CM | POA: Diagnosis not present

## 2016-12-18 DIAGNOSIS — D631 Anemia in chronic kidney disease: Secondary | ICD-10-CM | POA: Diagnosis not present

## 2016-12-18 DIAGNOSIS — E785 Hyperlipidemia, unspecified: Secondary | ICD-10-CM | POA: Diagnosis not present

## 2016-12-18 DIAGNOSIS — Z79891 Long term (current) use of opiate analgesic: Secondary | ICD-10-CM | POA: Diagnosis not present

## 2016-12-18 DIAGNOSIS — I5032 Chronic diastolic (congestive) heart failure: Secondary | ICD-10-CM | POA: Diagnosis not present

## 2016-12-18 DIAGNOSIS — I130 Hypertensive heart and chronic kidney disease with heart failure and stage 1 through stage 4 chronic kidney disease, or unspecified chronic kidney disease: Secondary | ICD-10-CM | POA: Diagnosis not present

## 2016-12-18 DIAGNOSIS — N182 Chronic kidney disease, stage 2 (mild): Secondary | ICD-10-CM | POA: Diagnosis not present

## 2016-12-18 DIAGNOSIS — M1991 Primary osteoarthritis, unspecified site: Secondary | ICD-10-CM | POA: Diagnosis not present

## 2016-12-19 DIAGNOSIS — N182 Chronic kidney disease, stage 2 (mild): Secondary | ICD-10-CM | POA: Diagnosis not present

## 2016-12-19 DIAGNOSIS — I130 Hypertensive heart and chronic kidney disease with heart failure and stage 1 through stage 4 chronic kidney disease, or unspecified chronic kidney disease: Secondary | ICD-10-CM | POA: Diagnosis not present

## 2016-12-19 DIAGNOSIS — M1991 Primary osteoarthritis, unspecified site: Secondary | ICD-10-CM | POA: Diagnosis not present

## 2016-12-19 DIAGNOSIS — D631 Anemia in chronic kidney disease: Secondary | ICD-10-CM | POA: Diagnosis not present

## 2016-12-19 DIAGNOSIS — H5461 Unqualified visual loss, right eye, normal vision left eye: Secondary | ICD-10-CM | POA: Diagnosis not present

## 2016-12-19 DIAGNOSIS — R531 Weakness: Secondary | ICD-10-CM | POA: Diagnosis not present

## 2016-12-19 DIAGNOSIS — Z79891 Long term (current) use of opiate analgesic: Secondary | ICD-10-CM | POA: Diagnosis not present

## 2016-12-19 DIAGNOSIS — R1312 Dysphagia, oropharyngeal phase: Secondary | ICD-10-CM | POA: Diagnosis not present

## 2016-12-19 DIAGNOSIS — Z87891 Personal history of nicotine dependence: Secondary | ICD-10-CM | POA: Diagnosis not present

## 2016-12-19 DIAGNOSIS — E785 Hyperlipidemia, unspecified: Secondary | ICD-10-CM | POA: Diagnosis not present

## 2016-12-19 DIAGNOSIS — J449 Chronic obstructive pulmonary disease, unspecified: Secondary | ICD-10-CM | POA: Diagnosis not present

## 2016-12-19 DIAGNOSIS — I5032 Chronic diastolic (congestive) heart failure: Secondary | ICD-10-CM | POA: Diagnosis not present

## 2016-12-20 ENCOUNTER — Telehealth: Payer: Self-pay

## 2016-12-20 NOTE — Telephone Encounter (Signed)
Mrs Alroy Dust Electrical engineer with [facility] left v/m requesting verbal orders to treat pt for swallowing 1 x a week for 2 weeks and 2 x a week for 2 weeks.

## 2016-12-20 NOTE — Telephone Encounter (Signed)
Tried to call Elizabeth Silva, with [facility], to give verbal orders but the voicemail is full so I was unable to leave a message.

## 2016-12-24 DIAGNOSIS — H5461 Unqualified visual loss, right eye, normal vision left eye: Secondary | ICD-10-CM | POA: Diagnosis not present

## 2016-12-24 DIAGNOSIS — J449 Chronic obstructive pulmonary disease, unspecified: Secondary | ICD-10-CM | POA: Diagnosis not present

## 2016-12-24 DIAGNOSIS — Z79891 Long term (current) use of opiate analgesic: Secondary | ICD-10-CM | POA: Diagnosis not present

## 2016-12-24 DIAGNOSIS — I130 Hypertensive heart and chronic kidney disease with heart failure and stage 1 through stage 4 chronic kidney disease, or unspecified chronic kidney disease: Secondary | ICD-10-CM | POA: Diagnosis not present

## 2016-12-24 DIAGNOSIS — R1312 Dysphagia, oropharyngeal phase: Secondary | ICD-10-CM | POA: Diagnosis not present

## 2016-12-24 DIAGNOSIS — I5032 Chronic diastolic (congestive) heart failure: Secondary | ICD-10-CM | POA: Diagnosis not present

## 2016-12-24 DIAGNOSIS — Z87891 Personal history of nicotine dependence: Secondary | ICD-10-CM | POA: Diagnosis not present

## 2016-12-24 DIAGNOSIS — E785 Hyperlipidemia, unspecified: Secondary | ICD-10-CM | POA: Diagnosis not present

## 2016-12-24 DIAGNOSIS — R531 Weakness: Secondary | ICD-10-CM | POA: Diagnosis not present

## 2016-12-24 DIAGNOSIS — M1991 Primary osteoarthritis, unspecified site: Secondary | ICD-10-CM | POA: Diagnosis not present

## 2016-12-24 DIAGNOSIS — N182 Chronic kidney disease, stage 2 (mild): Secondary | ICD-10-CM | POA: Diagnosis not present

## 2016-12-24 DIAGNOSIS — D631 Anemia in chronic kidney disease: Secondary | ICD-10-CM | POA: Diagnosis not present

## 2016-12-25 ENCOUNTER — Encounter: Payer: Self-pay | Admitting: Family Medicine

## 2016-12-25 ENCOUNTER — Telehealth: Payer: Self-pay

## 2016-12-25 NOTE — Telephone Encounter (Signed)
Hershal Coria, speech pathologist with [facility] left v/m requesting verbal orders for [facility] speech therapy 1 x a week for 2 weeks and 2 x a week for 2 weeks.

## 2016-12-25 NOTE — Telephone Encounter (Signed)
Left message for Elizabeth Silva giving verbal orders for [facility] speech therapy 1 x a week for 2 weeks and 2 x a week for 2 weeks per Dr. Diona Browner.

## 2016-12-25 NOTE — Telephone Encounter (Signed)
Okay to given verbal orders. 

## 2016-12-26 DIAGNOSIS — E785 Hyperlipidemia, unspecified: Secondary | ICD-10-CM | POA: Diagnosis not present

## 2016-12-26 DIAGNOSIS — M1991 Primary osteoarthritis, unspecified site: Secondary | ICD-10-CM | POA: Diagnosis not present

## 2016-12-26 DIAGNOSIS — I5032 Chronic diastolic (congestive) heart failure: Secondary | ICD-10-CM | POA: Diagnosis not present

## 2016-12-26 DIAGNOSIS — N182 Chronic kidney disease, stage 2 (mild): Secondary | ICD-10-CM | POA: Diagnosis not present

## 2016-12-26 DIAGNOSIS — R1312 Dysphagia, oropharyngeal phase: Secondary | ICD-10-CM | POA: Diagnosis not present

## 2016-12-26 DIAGNOSIS — D631 Anemia in chronic kidney disease: Secondary | ICD-10-CM | POA: Diagnosis not present

## 2016-12-26 DIAGNOSIS — R531 Weakness: Secondary | ICD-10-CM | POA: Diagnosis not present

## 2016-12-26 DIAGNOSIS — I130 Hypertensive heart and chronic kidney disease with heart failure and stage 1 through stage 4 chronic kidney disease, or unspecified chronic kidney disease: Secondary | ICD-10-CM | POA: Diagnosis not present

## 2016-12-26 DIAGNOSIS — Z87891 Personal history of nicotine dependence: Secondary | ICD-10-CM | POA: Diagnosis not present

## 2016-12-26 DIAGNOSIS — Z79891 Long term (current) use of opiate analgesic: Secondary | ICD-10-CM | POA: Diagnosis not present

## 2016-12-26 DIAGNOSIS — H5461 Unqualified visual loss, right eye, normal vision left eye: Secondary | ICD-10-CM | POA: Diagnosis not present

## 2016-12-26 DIAGNOSIS — J449 Chronic obstructive pulmonary disease, unspecified: Secondary | ICD-10-CM | POA: Diagnosis not present

## 2016-12-28 DIAGNOSIS — N182 Chronic kidney disease, stage 2 (mild): Secondary | ICD-10-CM | POA: Diagnosis not present

## 2016-12-28 DIAGNOSIS — I130 Hypertensive heart and chronic kidney disease with heart failure and stage 1 through stage 4 chronic kidney disease, or unspecified chronic kidney disease: Secondary | ICD-10-CM | POA: Diagnosis not present

## 2016-12-28 DIAGNOSIS — J449 Chronic obstructive pulmonary disease, unspecified: Secondary | ICD-10-CM | POA: Diagnosis not present

## 2016-12-28 DIAGNOSIS — Z87891 Personal history of nicotine dependence: Secondary | ICD-10-CM | POA: Diagnosis not present

## 2016-12-28 DIAGNOSIS — R531 Weakness: Secondary | ICD-10-CM | POA: Diagnosis not present

## 2016-12-28 DIAGNOSIS — Z79891 Long term (current) use of opiate analgesic: Secondary | ICD-10-CM | POA: Diagnosis not present

## 2016-12-28 DIAGNOSIS — D631 Anemia in chronic kidney disease: Secondary | ICD-10-CM | POA: Diagnosis not present

## 2016-12-28 DIAGNOSIS — M1991 Primary osteoarthritis, unspecified site: Secondary | ICD-10-CM | POA: Diagnosis not present

## 2016-12-28 DIAGNOSIS — E785 Hyperlipidemia, unspecified: Secondary | ICD-10-CM | POA: Diagnosis not present

## 2016-12-28 DIAGNOSIS — I5032 Chronic diastolic (congestive) heart failure: Secondary | ICD-10-CM | POA: Diagnosis not present

## 2016-12-28 DIAGNOSIS — R1312 Dysphagia, oropharyngeal phase: Secondary | ICD-10-CM | POA: Diagnosis not present

## 2016-12-28 DIAGNOSIS — H5461 Unqualified visual loss, right eye, normal vision left eye: Secondary | ICD-10-CM | POA: Diagnosis not present

## 2016-12-31 DIAGNOSIS — I5032 Chronic diastolic (congestive) heart failure: Secondary | ICD-10-CM | POA: Diagnosis not present

## 2016-12-31 DIAGNOSIS — I130 Hypertensive heart and chronic kidney disease with heart failure and stage 1 through stage 4 chronic kidney disease, or unspecified chronic kidney disease: Secondary | ICD-10-CM | POA: Diagnosis not present

## 2016-12-31 DIAGNOSIS — Z79891 Long term (current) use of opiate analgesic: Secondary | ICD-10-CM | POA: Diagnosis not present

## 2016-12-31 DIAGNOSIS — J449 Chronic obstructive pulmonary disease, unspecified: Secondary | ICD-10-CM | POA: Diagnosis not present

## 2016-12-31 DIAGNOSIS — H5461 Unqualified visual loss, right eye, normal vision left eye: Secondary | ICD-10-CM | POA: Diagnosis not present

## 2016-12-31 DIAGNOSIS — Z87891 Personal history of nicotine dependence: Secondary | ICD-10-CM | POA: Diagnosis not present

## 2016-12-31 DIAGNOSIS — D631 Anemia in chronic kidney disease: Secondary | ICD-10-CM | POA: Diagnosis not present

## 2016-12-31 DIAGNOSIS — M1991 Primary osteoarthritis, unspecified site: Secondary | ICD-10-CM | POA: Diagnosis not present

## 2016-12-31 DIAGNOSIS — E785 Hyperlipidemia, unspecified: Secondary | ICD-10-CM | POA: Diagnosis not present

## 2016-12-31 DIAGNOSIS — N182 Chronic kidney disease, stage 2 (mild): Secondary | ICD-10-CM | POA: Diagnosis not present

## 2016-12-31 DIAGNOSIS — R531 Weakness: Secondary | ICD-10-CM | POA: Diagnosis not present

## 2016-12-31 DIAGNOSIS — R1312 Dysphagia, oropharyngeal phase: Secondary | ICD-10-CM | POA: Diagnosis not present

## 2017-01-01 DIAGNOSIS — J449 Chronic obstructive pulmonary disease, unspecified: Secondary | ICD-10-CM | POA: Diagnosis not present

## 2017-01-01 DIAGNOSIS — Z87891 Personal history of nicotine dependence: Secondary | ICD-10-CM | POA: Diagnosis not present

## 2017-01-01 DIAGNOSIS — D631 Anemia in chronic kidney disease: Secondary | ICD-10-CM | POA: Diagnosis not present

## 2017-01-01 DIAGNOSIS — Z79891 Long term (current) use of opiate analgesic: Secondary | ICD-10-CM | POA: Diagnosis not present

## 2017-01-01 DIAGNOSIS — H5461 Unqualified visual loss, right eye, normal vision left eye: Secondary | ICD-10-CM | POA: Diagnosis not present

## 2017-01-01 DIAGNOSIS — E785 Hyperlipidemia, unspecified: Secondary | ICD-10-CM | POA: Diagnosis not present

## 2017-01-01 DIAGNOSIS — I130 Hypertensive heart and chronic kidney disease with heart failure and stage 1 through stage 4 chronic kidney disease, or unspecified chronic kidney disease: Secondary | ICD-10-CM | POA: Diagnosis not present

## 2017-01-01 DIAGNOSIS — I5032 Chronic diastolic (congestive) heart failure: Secondary | ICD-10-CM | POA: Diagnosis not present

## 2017-01-01 DIAGNOSIS — R1312 Dysphagia, oropharyngeal phase: Secondary | ICD-10-CM | POA: Diagnosis not present

## 2017-01-01 DIAGNOSIS — M1991 Primary osteoarthritis, unspecified site: Secondary | ICD-10-CM | POA: Diagnosis not present

## 2017-01-01 DIAGNOSIS — R531 Weakness: Secondary | ICD-10-CM | POA: Diagnosis not present

## 2017-01-01 DIAGNOSIS — N182 Chronic kidney disease, stage 2 (mild): Secondary | ICD-10-CM | POA: Diagnosis not present

## 2017-01-02 DIAGNOSIS — R1312 Dysphagia, oropharyngeal phase: Secondary | ICD-10-CM | POA: Diagnosis not present

## 2017-01-02 DIAGNOSIS — H5461 Unqualified visual loss, right eye, normal vision left eye: Secondary | ICD-10-CM | POA: Diagnosis not present

## 2017-01-02 DIAGNOSIS — R531 Weakness: Secondary | ICD-10-CM | POA: Diagnosis not present

## 2017-01-02 DIAGNOSIS — I130 Hypertensive heart and chronic kidney disease with heart failure and stage 1 through stage 4 chronic kidney disease, or unspecified chronic kidney disease: Secondary | ICD-10-CM | POA: Diagnosis not present

## 2017-01-02 DIAGNOSIS — E785 Hyperlipidemia, unspecified: Secondary | ICD-10-CM | POA: Diagnosis not present

## 2017-01-02 DIAGNOSIS — Z87891 Personal history of nicotine dependence: Secondary | ICD-10-CM | POA: Diagnosis not present

## 2017-01-02 DIAGNOSIS — Z79891 Long term (current) use of opiate analgesic: Secondary | ICD-10-CM | POA: Diagnosis not present

## 2017-01-02 DIAGNOSIS — N182 Chronic kidney disease, stage 2 (mild): Secondary | ICD-10-CM | POA: Diagnosis not present

## 2017-01-02 DIAGNOSIS — M1991 Primary osteoarthritis, unspecified site: Secondary | ICD-10-CM | POA: Diagnosis not present

## 2017-01-02 DIAGNOSIS — J449 Chronic obstructive pulmonary disease, unspecified: Secondary | ICD-10-CM | POA: Diagnosis not present

## 2017-01-02 DIAGNOSIS — I5032 Chronic diastolic (congestive) heart failure: Secondary | ICD-10-CM | POA: Diagnosis not present

## 2017-01-02 DIAGNOSIS — D631 Anemia in chronic kidney disease: Secondary | ICD-10-CM | POA: Diagnosis not present

## 2017-01-08 DIAGNOSIS — I130 Hypertensive heart and chronic kidney disease with heart failure and stage 1 through stage 4 chronic kidney disease, or unspecified chronic kidney disease: Secondary | ICD-10-CM | POA: Diagnosis not present

## 2017-01-08 DIAGNOSIS — R531 Weakness: Secondary | ICD-10-CM | POA: Diagnosis not present

## 2017-01-08 DIAGNOSIS — N182 Chronic kidney disease, stage 2 (mild): Secondary | ICD-10-CM | POA: Diagnosis not present

## 2017-01-08 DIAGNOSIS — R1312 Dysphagia, oropharyngeal phase: Secondary | ICD-10-CM | POA: Diagnosis not present

## 2017-01-08 DIAGNOSIS — I5032 Chronic diastolic (congestive) heart failure: Secondary | ICD-10-CM | POA: Diagnosis not present

## 2017-01-08 DIAGNOSIS — J449 Chronic obstructive pulmonary disease, unspecified: Secondary | ICD-10-CM | POA: Diagnosis not present

## 2017-01-08 DIAGNOSIS — Z79891 Long term (current) use of opiate analgesic: Secondary | ICD-10-CM | POA: Diagnosis not present

## 2017-01-08 DIAGNOSIS — H5461 Unqualified visual loss, right eye, normal vision left eye: Secondary | ICD-10-CM | POA: Diagnosis not present

## 2017-01-08 DIAGNOSIS — Z87891 Personal history of nicotine dependence: Secondary | ICD-10-CM | POA: Diagnosis not present

## 2017-01-08 DIAGNOSIS — D631 Anemia in chronic kidney disease: Secondary | ICD-10-CM | POA: Diagnosis not present

## 2017-01-08 DIAGNOSIS — M1991 Primary osteoarthritis, unspecified site: Secondary | ICD-10-CM | POA: Diagnosis not present

## 2017-01-08 DIAGNOSIS — E785 Hyperlipidemia, unspecified: Secondary | ICD-10-CM | POA: Diagnosis not present

## 2017-01-09 ENCOUNTER — Telehealth: Payer: Self-pay

## 2017-01-09 DIAGNOSIS — J449 Chronic obstructive pulmonary disease, unspecified: Secondary | ICD-10-CM | POA: Diagnosis not present

## 2017-01-09 DIAGNOSIS — N182 Chronic kidney disease, stage 2 (mild): Secondary | ICD-10-CM | POA: Diagnosis not present

## 2017-01-09 DIAGNOSIS — I130 Hypertensive heart and chronic kidney disease with heart failure and stage 1 through stage 4 chronic kidney disease, or unspecified chronic kidney disease: Secondary | ICD-10-CM | POA: Diagnosis not present

## 2017-01-09 DIAGNOSIS — M1991 Primary osteoarthritis, unspecified site: Secondary | ICD-10-CM | POA: Diagnosis not present

## 2017-01-09 DIAGNOSIS — R531 Weakness: Secondary | ICD-10-CM | POA: Diagnosis not present

## 2017-01-09 DIAGNOSIS — H5461 Unqualified visual loss, right eye, normal vision left eye: Secondary | ICD-10-CM | POA: Diagnosis not present

## 2017-01-09 DIAGNOSIS — I5032 Chronic diastolic (congestive) heart failure: Secondary | ICD-10-CM | POA: Diagnosis not present

## 2017-01-09 DIAGNOSIS — Z79891 Long term (current) use of opiate analgesic: Secondary | ICD-10-CM | POA: Diagnosis not present

## 2017-01-09 DIAGNOSIS — E785 Hyperlipidemia, unspecified: Secondary | ICD-10-CM | POA: Diagnosis not present

## 2017-01-09 DIAGNOSIS — R1312 Dysphagia, oropharyngeal phase: Secondary | ICD-10-CM | POA: Diagnosis not present

## 2017-01-09 DIAGNOSIS — D631 Anemia in chronic kidney disease: Secondary | ICD-10-CM | POA: Diagnosis not present

## 2017-01-09 DIAGNOSIS — Z87891 Personal history of nicotine dependence: Secondary | ICD-10-CM | POA: Diagnosis not present

## 2017-01-09 NOTE — Telephone Encounter (Signed)
Cat with [facility] left v/m;pt fell in her room this morning; pt said she lost her footing and fell to the floor; there was a skin tear to rt shin; area was cleaned and bandaged; pt doing ok, moving around with no complaints. FYI to Dr Diona Browner.

## 2017-01-09 NOTE — Telephone Encounter (Signed)
Noted  

## 2017-01-14 DIAGNOSIS — I130 Hypertensive heart and chronic kidney disease with heart failure and stage 1 through stage 4 chronic kidney disease, or unspecified chronic kidney disease: Secondary | ICD-10-CM | POA: Diagnosis not present

## 2017-01-14 DIAGNOSIS — D631 Anemia in chronic kidney disease: Secondary | ICD-10-CM | POA: Diagnosis not present

## 2017-01-14 DIAGNOSIS — H5461 Unqualified visual loss, right eye, normal vision left eye: Secondary | ICD-10-CM | POA: Diagnosis not present

## 2017-01-14 DIAGNOSIS — I5032 Chronic diastolic (congestive) heart failure: Secondary | ICD-10-CM | POA: Diagnosis not present

## 2017-01-14 DIAGNOSIS — J449 Chronic obstructive pulmonary disease, unspecified: Secondary | ICD-10-CM | POA: Diagnosis not present

## 2017-01-14 DIAGNOSIS — R1312 Dysphagia, oropharyngeal phase: Secondary | ICD-10-CM | POA: Diagnosis not present

## 2017-01-14 DIAGNOSIS — N182 Chronic kidney disease, stage 2 (mild): Secondary | ICD-10-CM | POA: Diagnosis not present

## 2017-01-14 DIAGNOSIS — M1991 Primary osteoarthritis, unspecified site: Secondary | ICD-10-CM | POA: Diagnosis not present

## 2017-01-14 DIAGNOSIS — R531 Weakness: Secondary | ICD-10-CM | POA: Diagnosis not present

## 2017-01-14 DIAGNOSIS — Z87891 Personal history of nicotine dependence: Secondary | ICD-10-CM | POA: Diagnosis not present

## 2017-01-14 DIAGNOSIS — Z79891 Long term (current) use of opiate analgesic: Secondary | ICD-10-CM | POA: Diagnosis not present

## 2017-01-14 DIAGNOSIS — E785 Hyperlipidemia, unspecified: Secondary | ICD-10-CM | POA: Diagnosis not present

## 2017-01-15 DIAGNOSIS — R531 Weakness: Secondary | ICD-10-CM | POA: Diagnosis not present

## 2017-01-15 DIAGNOSIS — M1991 Primary osteoarthritis, unspecified site: Secondary | ICD-10-CM | POA: Diagnosis not present

## 2017-01-15 DIAGNOSIS — H5461 Unqualified visual loss, right eye, normal vision left eye: Secondary | ICD-10-CM | POA: Diagnosis not present

## 2017-01-15 DIAGNOSIS — D631 Anemia in chronic kidney disease: Secondary | ICD-10-CM | POA: Diagnosis not present

## 2017-01-15 DIAGNOSIS — Z79891 Long term (current) use of opiate analgesic: Secondary | ICD-10-CM | POA: Diagnosis not present

## 2017-01-15 DIAGNOSIS — R1312 Dysphagia, oropharyngeal phase: Secondary | ICD-10-CM | POA: Diagnosis not present

## 2017-01-15 DIAGNOSIS — Z87891 Personal history of nicotine dependence: Secondary | ICD-10-CM | POA: Diagnosis not present

## 2017-01-15 DIAGNOSIS — I5032 Chronic diastolic (congestive) heart failure: Secondary | ICD-10-CM | POA: Diagnosis not present

## 2017-01-15 DIAGNOSIS — I130 Hypertensive heart and chronic kidney disease with heart failure and stage 1 through stage 4 chronic kidney disease, or unspecified chronic kidney disease: Secondary | ICD-10-CM | POA: Diagnosis not present

## 2017-01-15 DIAGNOSIS — E785 Hyperlipidemia, unspecified: Secondary | ICD-10-CM | POA: Diagnosis not present

## 2017-01-15 DIAGNOSIS — N182 Chronic kidney disease, stage 2 (mild): Secondary | ICD-10-CM | POA: Diagnosis not present

## 2017-01-15 DIAGNOSIS — J449 Chronic obstructive pulmonary disease, unspecified: Secondary | ICD-10-CM | POA: Diagnosis not present

## 2017-01-18 ENCOUNTER — Telehealth: Payer: Self-pay | Admitting: Family Medicine

## 2017-01-18 ENCOUNTER — Ambulatory Visit (INDEPENDENT_AMBULATORY_CARE_PROVIDER_SITE_OTHER): Payer: Medicare Other | Admitting: Family Medicine

## 2017-01-18 ENCOUNTER — Encounter: Payer: Self-pay | Admitting: Family Medicine

## 2017-01-18 VITALS — BP 143/64 | HR 70 | Temp 98.6°F | Ht 61.0 in | Wt 121.2 lb

## 2017-01-18 DIAGNOSIS — I10 Essential (primary) hypertension: Secondary | ICD-10-CM

## 2017-01-18 DIAGNOSIS — I5022 Chronic systolic (congestive) heart failure: Secondary | ICD-10-CM | POA: Diagnosis not present

## 2017-01-18 DIAGNOSIS — M6530 Trigger finger, unspecified finger: Secondary | ICD-10-CM | POA: Diagnosis not present

## 2017-01-18 DIAGNOSIS — J189 Pneumonia, unspecified organism: Secondary | ICD-10-CM

## 2017-01-18 DIAGNOSIS — R918 Other nonspecific abnormal finding of lung field: Secondary | ICD-10-CM | POA: Diagnosis not present

## 2017-01-18 DIAGNOSIS — N184 Chronic kidney disease, stage 4 (severe): Secondary | ICD-10-CM

## 2017-01-18 NOTE — Progress Notes (Signed)
Pre visit review using our clinic review tool, if applicable. No additional management support is needed unless otherwise documented below in the visit note. 

## 2017-01-18 NOTE — Progress Notes (Signed)
Subjective:    Patient ID: Elizabeth Silva, female    DOB: Feb 16, 1918, 83 y.o.   MRN: [id number]  HPI   83 year old female presents for 3 months follow up.   She has history of abn CT chest likely  Dx of lung cancer.. 10/28/2016- 11/02/2016  Treated for bilateral lung pneumonia, CHF. IV antibiotics, Azithro, lasix  Went to rehab for weakness and deconditioning. Caught flu while she was there. Treated with prednisone and tamiflu. Discharge 2/13. COPD: on adviar, spiriva and proair prn.  She reports she coughs, clear. Did have streaks blood in mucus  1 month ago ( 3/13 see pt email), lasted several weeks.  She is using incentive spirometry. Wt Readings from Last 3 Encounters:  01/18/17 121 lb 4 oz (55 kg)  12/11/16 121 lb 12 oz (55.2 kg)  12/06/16 122 lb 8 oz (55.6 kg)   Good appetite. Good energy overall.   CHF:  Followed by cardiology. On hydralazine, bystolic.  She has not required lasix in a while using prn.  Appt with Dr. Rockey Situ 11/2016  She is currently getting PT.   She has been dizzy off and on, going sitting to standing. Sounds like iocc vertigo.  encouraged pt to move slowly siting to standing.  150/52  Has noted trigger finger.. Requests referrsl.    Blood pressure (!) 143/64, pulse 70, temperature 98.6 F (37 C), temperature source Oral, height '5\' 1"'$  (1.549 m), weight 121 lb 4 oz (55 kg), SpO2 96 %.  Review of Systems  Constitutional: Negative for fatigue and fever.  HENT: Negative for congestion.   Eyes: Negative for pain.  Respiratory: Positive for cough and shortness of breath.   Cardiovascular: Negative for chest pain, palpitations and leg swelling.  Gastrointestinal: Negative for abdominal pain.  Genitourinary: Negative for dysuria and vaginal bleeding.  Musculoskeletal: Negative for back pain.  Neurological: Positive for light-headedness. Negative for syncope and headaches.  Psychiatric/Behavioral: Negative for dysphoric mood.       Objective:   Physical Exam  Constitutional: Vital signs are normal. She appears well-developed and well-nourished. She is cooperative.  Non-toxic appearance. She does not appear ill. No distress.  Elderly female in NAD  HENT:  Head: Normocephalic.  Right Ear: Hearing, tympanic membrane, external ear and ear canal normal. Tympanic membrane is not erythematous, not retracted and not bulging.  Left Ear: Hearing, tympanic membrane, external ear and ear canal normal. Tympanic membrane is not erythematous, not retracted and not bulging.  Nose: No mucosal edema or rhinorrhea. Right sinus exhibits no maxillary sinus tenderness and no frontal sinus tenderness. Left sinus exhibits no maxillary sinus tenderness and no frontal sinus tenderness.  Mouth/Throat: Uvula is midline, oropharynx is clear and moist and mucous membranes are normal.  Eyes: Conjunctivae, EOM and lids are normal. Pupils are equal, round, and reactive to light. Lids are everted and swept, no foreign bodies found.  Neck: Trachea normal and normal range of motion. Neck supple. Carotid bruit is not present. No thyroid mass and no thyromegaly present.  Cardiovascular: Normal rate, regular rhythm, S1 normal, S2 normal, normal heart sounds, intact distal pulses and normal pulses.  Exam reveals no gallop and no friction rub.   No murmur heard. 1 plus swelling R> L  Pulmonary/Chest: Effort normal. No tachypnea. No respiratory distress. She has no decreased breath sounds. She has no wheezes. She has no rhonchi. She has no rales.  Abdominal: Soft. Normal appearance and bowel sounds are normal. There is no tenderness.  Neurological: She is alert.  Skin: Skin is warm, dry and intact. No rash noted.  Psychiatric: Her speech is normal and behavior is normal. Judgment and thought content normal. Her mood appears not anxious. Cognition and memory are normal. She does not exhibit a depressed mood.          Assessment & Plan:

## 2017-01-18 NOTE — Telephone Encounter (Signed)
Labs entered.

## 2017-01-18 NOTE — Patient Instructions (Signed)
If interested in trigger finger injection.. Make an appt to see Dr. Lorelei Pont.

## 2017-01-18 NOTE — Telephone Encounter (Signed)
Pt schedule cpx for 7/10 she wants to get labs done at [place] office.  Need orders in system

## 2017-01-22 DIAGNOSIS — R531 Weakness: Secondary | ICD-10-CM | POA: Diagnosis not present

## 2017-01-22 DIAGNOSIS — E785 Hyperlipidemia, unspecified: Secondary | ICD-10-CM | POA: Diagnosis not present

## 2017-01-22 DIAGNOSIS — M1991 Primary osteoarthritis, unspecified site: Secondary | ICD-10-CM | POA: Diagnosis not present

## 2017-01-22 DIAGNOSIS — D631 Anemia in chronic kidney disease: Secondary | ICD-10-CM | POA: Diagnosis not present

## 2017-01-22 DIAGNOSIS — H5461 Unqualified visual loss, right eye, normal vision left eye: Secondary | ICD-10-CM | POA: Diagnosis not present

## 2017-01-22 DIAGNOSIS — Z87891 Personal history of nicotine dependence: Secondary | ICD-10-CM | POA: Diagnosis not present

## 2017-01-22 DIAGNOSIS — N182 Chronic kidney disease, stage 2 (mild): Secondary | ICD-10-CM | POA: Diagnosis not present

## 2017-01-22 DIAGNOSIS — I130 Hypertensive heart and chronic kidney disease with heart failure and stage 1 through stage 4 chronic kidney disease, or unspecified chronic kidney disease: Secondary | ICD-10-CM | POA: Diagnosis not present

## 2017-01-22 DIAGNOSIS — J449 Chronic obstructive pulmonary disease, unspecified: Secondary | ICD-10-CM | POA: Diagnosis not present

## 2017-01-22 DIAGNOSIS — Z79891 Long term (current) use of opiate analgesic: Secondary | ICD-10-CM | POA: Diagnosis not present

## 2017-01-22 DIAGNOSIS — I5032 Chronic diastolic (congestive) heart failure: Secondary | ICD-10-CM | POA: Diagnosis not present

## 2017-01-22 DIAGNOSIS — R1312 Dysphagia, oropharyngeal phase: Secondary | ICD-10-CM | POA: Diagnosis not present

## 2017-01-23 DIAGNOSIS — H5461 Unqualified visual loss, right eye, normal vision left eye: Secondary | ICD-10-CM | POA: Diagnosis not present

## 2017-01-23 DIAGNOSIS — Z79891 Long term (current) use of opiate analgesic: Secondary | ICD-10-CM | POA: Diagnosis not present

## 2017-01-23 DIAGNOSIS — R531 Weakness: Secondary | ICD-10-CM | POA: Diagnosis not present

## 2017-01-23 DIAGNOSIS — J449 Chronic obstructive pulmonary disease, unspecified: Secondary | ICD-10-CM | POA: Diagnosis not present

## 2017-01-23 DIAGNOSIS — R1312 Dysphagia, oropharyngeal phase: Secondary | ICD-10-CM | POA: Diagnosis not present

## 2017-01-23 DIAGNOSIS — I130 Hypertensive heart and chronic kidney disease with heart failure and stage 1 through stage 4 chronic kidney disease, or unspecified chronic kidney disease: Secondary | ICD-10-CM | POA: Diagnosis not present

## 2017-01-23 DIAGNOSIS — D631 Anemia in chronic kidney disease: Secondary | ICD-10-CM | POA: Diagnosis not present

## 2017-01-23 DIAGNOSIS — Z87891 Personal history of nicotine dependence: Secondary | ICD-10-CM | POA: Diagnosis not present

## 2017-01-23 DIAGNOSIS — I5032 Chronic diastolic (congestive) heart failure: Secondary | ICD-10-CM | POA: Diagnosis not present

## 2017-01-23 DIAGNOSIS — N182 Chronic kidney disease, stage 2 (mild): Secondary | ICD-10-CM | POA: Diagnosis not present

## 2017-01-23 DIAGNOSIS — M1991 Primary osteoarthritis, unspecified site: Secondary | ICD-10-CM | POA: Diagnosis not present

## 2017-01-23 DIAGNOSIS — E785 Hyperlipidemia, unspecified: Secondary | ICD-10-CM | POA: Diagnosis not present

## 2017-01-31 DIAGNOSIS — R531 Weakness: Secondary | ICD-10-CM | POA: Diagnosis not present

## 2017-01-31 DIAGNOSIS — R1312 Dysphagia, oropharyngeal phase: Secondary | ICD-10-CM | POA: Diagnosis not present

## 2017-01-31 DIAGNOSIS — I130 Hypertensive heart and chronic kidney disease with heart failure and stage 1 through stage 4 chronic kidney disease, or unspecified chronic kidney disease: Secondary | ICD-10-CM | POA: Diagnosis not present

## 2017-02-04 ENCOUNTER — Other Ambulatory Visit: Payer: Self-pay | Admitting: Family Medicine

## 2017-02-04 ENCOUNTER — Telehealth: Payer: Self-pay | Admitting: Family Medicine

## 2017-02-04 MED ORDER — PREDNISONE 20 MG PO TABS
ORAL_TABLET | ORAL | 0 refills | Status: DC
Start: 2017-02-04 — End: 2017-03-06

## 2017-02-04 NOTE — Telephone Encounter (Signed)
Reasonable in a patient with COPD  Prednisone 20 mg, 2 tabs po for 5 days, then 1 tab po for 3 days, #13

## 2017-02-04 NOTE — Telephone Encounter (Signed)
Spoke with Collie Siad who states Ms. Vohra is taking the Ditropan.  Refills sent to [facility]. While on the phone Collie Siad states Ms. Shiroma has chest congestion and states the only thing that works for her is Prednisone. She states she really don't have any other symptoms but the congestion.  Collie Siad is going out of town and would like Rx sent into [facility] so she can pick it up today.  Please advise

## 2017-02-04 NOTE — Telephone Encounter (Signed)
Refill sent into [place] as instructed by Dr. Lorelei Pont.  Collie Siad (daughter) notified.

## 2017-02-04 NOTE — Telephone Encounter (Signed)
Collie Siad pts daughter (DPR signed) left v/m requesting a prescription for prednisone sent to [facility]. Pt has chest congestion and prednisone is only thing that helps pt.Collie Siad said Dr Diona Browner is aware of this and Collie Siad is going out of state and request med sent to [facility] today. Pt last seen 3 month f/u on 01/18/17. Collie Siad request cb.

## 2017-02-04 NOTE — Telephone Encounter (Signed)
Call pt.. Does she wish to restart this for incontinence or is it a pharmacy request error. I have no documentation of issue/SE with it in her case  in past, but it can cause fatigue, dry mouth, constipation as SE.

## 2017-02-04 NOTE — Telephone Encounter (Signed)
See Phone Call from 02/04/2017.  Prednisone Rx sent to [facility] per Dr. Lorelei Pont.

## 2017-02-04 NOTE — Telephone Encounter (Signed)
Last office visit 01/18/2017.  Ditropan not on current medication list.  Refill?

## 2017-02-20 ENCOUNTER — Ambulatory Visit: Payer: Medicare Other | Admitting: Family Medicine

## 2017-03-04 DIAGNOSIS — M6530 Trigger finger, unspecified finger: Secondary | ICD-10-CM | POA: Insufficient documentation

## 2017-03-04 NOTE — Assessment & Plan Note (Signed)
Possible adenocarcinoma.. Pt and family continue to not want further work up at this time.

## 2017-03-04 NOTE — Assessment & Plan Note (Addendum)
Well controlled at upper limits of normal. tolerable levels for her age and in setting of possible occ orthostatic hypotension. Continue current medication.

## 2017-03-04 NOTE — Assessment & Plan Note (Signed)
Fairly euvolemic for her today on current meds.

## 2017-03-04 NOTE — Assessment & Plan Note (Signed)
Resolved.. Improvement in energy and symptoms at this time.

## 2017-03-04 NOTE — Assessment & Plan Note (Signed)
Refer to Sm Dr Lorelei Pont for possible steroid injection or splinting.

## 2017-03-06 ENCOUNTER — Ambulatory Visit (INDEPENDENT_AMBULATORY_CARE_PROVIDER_SITE_OTHER): Payer: Medicare Other | Admitting: Family Medicine

## 2017-03-06 ENCOUNTER — Encounter: Payer: Self-pay | Admitting: Family Medicine

## 2017-03-06 VITALS — BP 141/58 | HR 76 | Temp 99.1°F | Ht 61.0 in | Wt 118.2 lb

## 2017-03-06 DIAGNOSIS — M1711 Unilateral primary osteoarthritis, right knee: Secondary | ICD-10-CM | POA: Diagnosis not present

## 2017-03-06 DIAGNOSIS — M65342 Trigger finger, left ring finger: Secondary | ICD-10-CM

## 2017-03-06 MED ORDER — METHYLPREDNISOLONE ACETATE 40 MG/ML IJ SUSP
20.0000 mg | Freq: Once | INTRAMUSCULAR | Status: AC
  Administered 2017-03-06: 20 mg via INTRA_ARTICULAR

## 2017-03-06 MED ORDER — IPRATROPIUM BROMIDE 0.03 % NA SOLN: 2.0000 | Freq: Two times a day (BID) | NASAL | 12 refills | Status: AC

## 2017-03-06 MED ORDER — METHYLPREDNISOLONE ACETATE 40 MG/ML IJ SUSP
80.0000 mg | Freq: Once | INTRAMUSCULAR | Status: AC
  Administered 2017-03-06: 80 mg via INTRAMUSCULAR

## 2017-03-06 NOTE — Progress Notes (Signed)
Cardiology Office Note  Date:  03/07/2017   ID:  Elizabeth Silva, DOB 06-15-1918, MRN [id number]  PCP:  Jinny Sanders, MD   Chief Complaint  Patient presents with  . other    3 month follow up. Patient c/o SOB and some pain in her chest. Meds reviewed verbally with patient.     HPI:  83 yo with history of  malignant HTN,  COPD,  hyperlipidemia,  left bundle-branch block,  moderate tricuspid valve regurgitation since 2011 with normal ejection fraction in 2012,  Ophthalmologist feels her vision loss is secondary to atherosclerotic disease and hypertension. vision loss in her left eye. 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 carotid arterial disease presents today for routine followup of her hypertension, Leg swelling  at the [facility] home In follow-up today she reports that she feels well bystolic is very expensive for her,  $100  per month Taking Lasix sparingly   drinks are thickened  except for water   walking with a walker, no recent falls    hospitalization January 2018, Discharge 11/02/2016.  Presenting with shortness of breath, respiratory failure She presented from nursing home with shortness of breath symptoms, respiratory infection,  started on antibiotics and inhalers an outpatient but symptoms got worse, hypoxic with saturations in the 80s, started on BiPAP in the hospital Diagnosed with multilobar pneumonia, long hospital course Went to [facility], 21 days of rehabilitation, left 11/27/16 Reports that she got flu infection while she was in rehabilitation  EKG on today's visit shows normal sinus rhythm with rate 79 bpm, left bundle branch block  Other past medical history reviewed  Previous CT scan of her chest for possible pneumonia shows diffuse aortic atherosclerosis, coronary artery disease noted in the proximal LAD  Vision continues to cause major issues, some progression  She does report having myalgias on simvastatin in the past  Chronic  fatigue/poor energy poor several years. She's not using her cane.  Previous hospital admission for sepsis, recurrent pneumonia, diagnosed with C. Difficile.  treated with Flagyl and diarrhea improved. Notes from the hospital indicate Klebsiella urinary tract infection treated with antibiotics, C. difficile improving on Flagyl, bradycardia, recurrent pneumonia that was improving on antibiotics, sepsis that was treated and improved, chronic kidney disease.    Previous  Echo was suggestive of diastolic dysfunction with normal EF (60-65%). Moderate TR      PMH:   has a past medical history of Allergic rhinitis; CKD (chronic kidney disease); COPD (chronic obstructive pulmonary disease) ([place]); Diastolic CHF, acute ([place]); Diverticulosis of colon; GERD (gastroesophageal reflux disease); GI bleed; H/O Clostridium difficile infection (Sept. 2013); History of PFTs; Hyperlipidemia; Hypertension; Impaired vision; LBBB (left bundle branch block); and Osteoarthritis.  PSH:    Past Surgical History:  Procedure Laterality Date  . CARDIAC CATHETERIZATION  1999   normal  . CERVICAL [place] SURGERY  2005   cerv. disc, dz. C5-C7, facet arthritis  . CHOLECYSTECTOMY  1980  . TOTAL ABDOMINAL HYSTERECTOMY  1970   suspicious cells    Current Outpatient Prescriptions  Medication Sig Dispense Refill  . acetaminophen (TYLENOL) 500 MG tablet Take 500 mg by mouth every 6 (six) hours as needed for mild pain or fever.     Marland Kitchen ADVAIR DISKUS 250-50 MCG/DOSE AEPB INHALE 1 PUFF INTO THE  LUNGS TWO TIMES DAILY 180 each 3  . aspirin 81 MG EC tablet Take 81 mg by mouth daily.      . ferrous fumarate (HEMOCYTE - 106 MG FE)  325 (106 FE) MG TABS tablet Take 1 tablet (106 mg of iron total) by mouth daily. 90 each 3  . fluticasone (FLONASE) 50 MCG/ACT nasal spray Place 2 sprays into both nostrils daily. 48 g 11  . furosemide (LASIX) 20 MG tablet Take 1 tablet (20 mg total) by mouth daily. 90 tablet 3  . gabapentin (NEURONTIN) 600 MG  tablet Take 1 tablet by mouth at  bedtime 90 tablet 3  . hydrALAZINE (APRESOLINE) 25 MG tablet Take 1 tablet (25 mg total) by mouth 3 (three) times daily. 270 tablet 3  . ipratropium (ATROVENT) 0.03 % nasal spray Place 2 sprays into both nostrils every 12 (twelve) hours. 30 mL 12  . Lactobacillus Rhamnosus, GG, (CULTURELLE PO) Take 1 tablet by mouth 2 (two) times daily.    Marland Kitchen lovastatin (MEVACOR) 20 MG tablet Take 1 tablet by mouth at  bedtime 90 tablet 3  . omeprazole (PRILOSEC) 40 MG capsule TAKE 1 CAPSULE BY MOUTH  DAILY 90 capsule 1  . PROAIR HFA 108 (90 Base) MCG/ACT inhaler INHALE 2 PUFFS EVERY 6  HOURS AS NEEDED FOR  WHEEZING OR SHORTNESS OF  BREATH. 34 g 2  . SPIRIVA HANDIHALER 18 MCG inhalation capsule INHALE THE CONTENTS OF 1  CAPSULE VIA HANDIHALER  DAILY 90 capsule 3  . traMADol (ULTRAM) 50 MG tablet Take 1 tablet (50 mg total) by mouth every 8 (eight) hours as needed for severe pain. 20 tablet 0  . vitamin E 1000 UNIT capsule Take 1,000 Units by mouth daily.      . metoprolol succinate (TOPROL-XL) 25 MG 24 hr tablet Take 1 tablet (25 mg total) by mouth daily. Take with or immediately following a meal. 90 tablet 3   No current facility-administered medications for this visit.      Allergies:   Levofloxacin and Penicillins   Social History:  The patient  reports that she quit smoking about 32 years ago. Her smoking use included Cigarettes. She smoked 1.00 pack per day. She has never used smokeless tobacco. She reports that she drinks alcohol. She reports that she does not use drugs.   Family History:   family history includes Cancer in her brother; Colon cancer in her sister; Dementia in her father and sister; Diabetes in her brother; Heart block in her mother; Lung cancer in her brother.    Review of Systems: Review of Systems  Constitutional: Positive for malaise/fatigue.  Respiratory: Negative.   Cardiovascular: Positive for leg swelling.  Gastrointestinal: Negative.    Musculoskeletal: Negative.        Unsteady gait  Neurological: Positive for weakness.  Psychiatric/Behavioral: Negative.   All other systems reviewed and are negative.    PHYSICAL EXAM: VS:  BP (!) 140/50 (BP Location: Left Arm, Patient Position: Sitting, Cuff Size: Normal)   Pulse 79   Ht 5\' 2"  (1.575 m)   Wt 118 lb 8 oz (53.8 kg)   BMI 21.67 kg/m  , BMI Body mass index is 21.67 kg/m.  GEN: Frail, thin in no acute distress ,  walking with a walker  HEENT: normal  Neck: no JVD, carotid bruits, or masses Cardiac: RRR; no murmurs, rubs, or gallops,, trace lower extremity edema around the ankles Respiratory:  clear to auscultation bilaterally, normal work of breathing GI: soft, nontender, nondistended, + BS MS: no deformity or atrophy  Skin: warm and dry, no rash Neuro:  Strength and sensation are intact Psych: euthymic mood, full affect  Recent Labs: 10/28/2016: B Natriuretic Peptide  1,008.0 10/29/2016: Magnesium 2.2 11/06/2016: ALT 16; Hemoglobin 11.4; Platelets 272 12/11/2016: BUN 29; Creatinine, Ser 1.97; Potassium 4.9; Sodium 143    Lipid Panel Lab Results  Component Value Date   CHOL 135 01/10/2016   HDL 40.90 01/10/2016   LDLCALC 60 01/10/2016   TRIG 168.0 (H) 01/10/2016      Wt Readings from Last 3 Encounters:  03/07/17 118 lb 8 oz (53.8 kg)  03/06/17 118 lb 4 oz (53.6 kg)  01/18/17 121 lb 4 oz (55 kg)       ASSESSMENT AND PLAN:  HYPERCHOLESTEROLEMIA, PURE - Plan: EKG 12-Lead Cholesterol is at goal on the current lipid regimen. No changes to the medications were made.  Essential hypertension, benign - Plan: EKG 12-Lead Blood pressure is well controlled on today's visit.  bystolic Is expensive we will stop the medication and changed to metoprolol succinate 25 mg daily  DIASTOLIC HEART FAILURE, CHRONIC - Plan: EKG 12-Lead Recommended Lasix as needed for ankle swelling, weight gain, shortness of breath   she is drinking less as fluids are thickened  except for water  Chronic venous insufficiency Minimal ankle swelling   Chronic kidney disease, stage 4, severely decreased GFR (HCC) Creatinine 1.8, around her baseline Stable over the past year   Total encounter time more than 25 minutes  Greater than 50% was spent in counseling and coordination of care with the patient   Disposition:   F/U  6 months   Orders Placed This Encounter  Procedures  . EKG 12-Lead     Signed, Esmond Plants, M.D., Ph.D. 03/07/2017  [place], [place]

## 2017-03-06 NOTE — Progress Notes (Signed)
   Dr. Frederico Hamman T. Ioma Chismar, MD, [facility] [place], [address] Phone: [phone] Fax: [phone]  03/06/2017  Patient: Elizabeth Silva, MRN: [id number], DOB: Apr 02, 1918, 83 y.o.  Primary Physician:  Jinny Sanders, MD   Chief Complaint  Patient presents with  . Trigger Finger    Left ringer  . Knee Pain    Right      Procedure only.  Known R knee OA.   Knee Injection, R Patient verbally consented to procedure. Risks (including potential rare risk of infection), benefits, and alternatives explained. Sterilely prepped with Chloraprep. Ethyl cholride used for anesthesia. 8 cc Lidocaine 1% mixed with 2 mL Depo-Medrol 40 mg injected using the anteromedial approach without difficulty. No complications with procedure and tolerated well. Patient had decreased pain post-injection.   Trigger Finger Injection, LEFT Verbal consent was obtained. Risks (including rare risk of infection, potential risk for skin lightening and potential atrophy), benefits and alternatives were discussed. Prepped with Chloraprep and Ethyl Chloride used for anesthesia. Under sterile conditions, patient injected at palmar crease aiming distally with 45 degree angle towards nodule; injected directly into tendon sheath. Medication flowed freely without resistance.  Needle size: 22 gauge 1 1/2 inch Injection: 1/2 cc of Lidocaine 1% and Depo-Medrol 20 mg   Signed,  Gilberte Gorley T. Novella Abraha, MD

## 2017-03-06 NOTE — Addendum Note (Signed)
Addended by: Carter Kitten on: 03/06/2017 03:33 PM   Modules accepted: Orders

## 2017-03-07 ENCOUNTER — Encounter: Payer: Self-pay | Admitting: Cardiovascular Disease

## 2017-03-07 ENCOUNTER — Ambulatory Visit (INDEPENDENT_AMBULATORY_CARE_PROVIDER_SITE_OTHER): Payer: Medicare Other | Admitting: Cardiovascular Disease

## 2017-03-07 VITALS — BP 140/50 | HR 79 | Ht 62.0 in | Wt 118.5 lb

## 2017-03-07 DIAGNOSIS — E7800 Pure hypercholesterolemia, unspecified: Secondary | ICD-10-CM

## 2017-03-07 DIAGNOSIS — I5032 Chronic diastolic (congestive) heart failure: Secondary | ICD-10-CM | POA: Diagnosis not present

## 2017-03-07 DIAGNOSIS — I10 Essential (primary) hypertension: Secondary | ICD-10-CM

## 2017-03-07 DIAGNOSIS — N184 Chronic kidney disease, stage 4 (severe): Secondary | ICD-10-CM | POA: Diagnosis not present

## 2017-03-07 DIAGNOSIS — I5022 Chronic systolic (congestive) heart failure: Secondary | ICD-10-CM | POA: Diagnosis not present

## 2017-03-07 MED ORDER — METOPROLOL SUCCINATE ER 25 MG PO TB24: 25.0000 mg | ORAL_TABLET | Freq: Every day | ORAL | 3 refills | Status: DC

## 2017-03-07 NOTE — Patient Instructions (Addendum)
Medication Instructions:   Please stop the bystolic when you run out  Start metoprolol one a day  Lasix as needed for shortness of breath, weight gain and leg swelling  Labwork:  No new labs needed  Testing/Procedures:  No further testing at this time   I recommend watching educational videos on topics of interest to you at:       [email or web address]  Enter code: [email or web address]    Follow-Up: It was a pleasure seeing you in the office today. Please call us if you have new issues that need to be addressed before your next appt.  [phone]  Your physician wants you to follow-up in: 6 months.  You will receive a reminder letter in the mail two months in advance. If you don't receive a letter, please call our office to schedule the follow-up appointment.  If you need a refill on your cardiac medications before your next appointment, please call your pharmacy.

## 2017-03-25 ENCOUNTER — Telehealth: Payer: Self-pay | Admitting: Cardiovascular Disease

## 2017-03-25 NOTE — Telephone Encounter (Signed)
Pt daughter calling stating they received a letter from [employer] stating they have not been able to get in contact with [place] regarding patient Metoprolol  Please call them back at [phone]

## 2017-03-25 NOTE — Telephone Encounter (Signed)
Spoke w/ Collie Siad.  She states that [facility] was unable to fill this and sent them a letter than we have not responded to them. Advised her that we have no documentation that they have tried to contact us.  Spoke w/ pharmacist.  He wanted to clarify to pt is not taking Bystolic and metoprolol together.  Advised him that pt was instructed to finish the Bystolic she has and start the metoprolol after she runs out.  Dr. Rockey Situ sent the rx in w/ instructions to fill in August.  He will place this on file and call back w/ any further questions or concerns.

## 2017-04-08 ENCOUNTER — Other Ambulatory Visit: Payer: Self-pay | Admitting: Family Medicine

## 2017-04-18 ENCOUNTER — Other Ambulatory Visit (INDEPENDENT_AMBULATORY_CARE_PROVIDER_SITE_OTHER): Payer: Medicare Other

## 2017-04-18 DIAGNOSIS — I10 Essential (primary) hypertension: Secondary | ICD-10-CM | POA: Diagnosis not present

## 2017-04-18 DIAGNOSIS — N184 Chronic kidney disease, stage 4 (severe): Secondary | ICD-10-CM | POA: Diagnosis not present

## 2017-04-18 LAB — CBC WITH DIFFERENTIAL/PLATELET
BASOS PCT: 1 % (ref 0.0–3.0)
Basophils Absolute: 0.1 10*3/uL (ref 0.0–0.1)
EOS PCT: 8.5 % — AB (ref 0.0–5.0)
Eosinophils Absolute: 0.7 10*3/uL (ref 0.0–0.7)
HCT: 36.9 % (ref 36.0–46.0)
HEMOGLOBIN: 11.9 g/dL — AB (ref 12.0–15.0)
LYMPHS ABS: 1.3 10*3/uL (ref 0.7–4.0)
Lymphocytes Relative: 14.7 % (ref 12.0–46.0)
MCHC: 32.2 g/dL (ref 30.0–36.0)
MCV: 87.6 fl (ref 78.0–100.0)
MONO ABS: 0.9 10*3/uL (ref 0.1–1.0)
MONOS PCT: 9.8 % (ref 3.0–12.0)
NEUTROS PCT: 66 % (ref 43.0–77.0)
Neutro Abs: 5.8 10*3/uL (ref 1.4–7.7)
Platelets: 236 10*3/uL (ref 150.0–400.0)
RBC: 4.21 Mil/uL (ref 3.87–5.11)
RDW: 16.3 % — AB (ref 11.5–15.5)
WBC: 8.8 10*3/uL (ref 4.0–10.5)

## 2017-04-18 LAB — COMPREHENSIVE METABOLIC PANEL
ALBUMIN: 3.8 g/dL (ref 3.5–5.2)
ALK PHOS: 55 U/L (ref 39–117)
ALT: 6 U/L (ref 0–35)
AST: 11 U/L (ref 0–37)
BUN: 38 mg/dL — AB (ref 6–23)
CO2: 26 mEq/L (ref 19–32)
Calcium: 9.2 mg/dL (ref 8.4–10.5)
Chloride: 107 mEq/L (ref 96–112)
Creatinine, Ser: 1.67 mg/dL — ABNORMAL HIGH (ref 0.40–1.20)
GFR: 30.14 mL/min — ABNORMAL LOW (ref >60.00)
Glucose, Bld: 120 mg/dL — ABNORMAL HIGH (ref 70–99)
POTASSIUM: 4.4 meq/L (ref 3.5–5.1)
SODIUM: 141 meq/L (ref 135–145)
TOTAL PROTEIN: 6.9 g/dL (ref 6.0–8.3)
Total Bilirubin: 0.3 mg/dL (ref 0.2–1.2)

## 2017-04-18 LAB — VITAMIN D 25 HYDROXY (VIT D DEFICIENCY, FRACTURES): VITD: 32.43 ng/mL (ref 30.00–100.00)

## 2017-04-23 ENCOUNTER — Encounter: Payer: Self-pay | Admitting: Family Medicine

## 2017-04-23 ENCOUNTER — Ambulatory Visit (INDEPENDENT_AMBULATORY_CARE_PROVIDER_SITE_OTHER): Payer: Medicare Other | Admitting: Family Medicine

## 2017-04-23 VITALS — BP 100/52 | HR 74 | Temp 98.7°F | Ht 60.0 in | Wt 121.2 lb

## 2017-04-23 DIAGNOSIS — M6530 Trigger finger, unspecified finger: Secondary | ICD-10-CM | POA: Diagnosis not present

## 2017-04-23 DIAGNOSIS — I10 Essential (primary) hypertension: Secondary | ICD-10-CM

## 2017-04-23 DIAGNOSIS — I5022 Chronic systolic (congestive) heart failure: Secondary | ICD-10-CM

## 2017-04-23 DIAGNOSIS — N184 Chronic kidney disease, stage 4 (severe): Secondary | ICD-10-CM | POA: Diagnosis not present

## 2017-04-23 DIAGNOSIS — N183 Chronic kidney disease, stage 3 unspecified: Secondary | ICD-10-CM

## 2017-04-23 DIAGNOSIS — D692 Other nonthrombocytopenic purpura: Secondary | ICD-10-CM

## 2017-04-23 DIAGNOSIS — D631 Anemia in chronic kidney disease: Secondary | ICD-10-CM | POA: Diagnosis not present

## 2017-04-23 DIAGNOSIS — Z0001 Encounter for general adult medical examination with abnormal findings: Secondary | ICD-10-CM

## 2017-04-23 DIAGNOSIS — M65342 Trigger finger, left ring finger: Secondary | ICD-10-CM

## 2017-04-23 DIAGNOSIS — J449 Chronic obstructive pulmonary disease, unspecified: Secondary | ICD-10-CM

## 2017-04-23 DIAGNOSIS — Z0000 Encounter for general adult medical examination without abnormal findings: Secondary | ICD-10-CM

## 2017-04-23 NOTE — Assessment & Plan Note (Signed)
Stable on recent labs.

## 2017-04-23 NOTE — Assessment & Plan Note (Signed)
Minimal improvement with steroid injection.  Consider splinting or referral to hand specialist if not improving.

## 2017-04-23 NOTE — Patient Instructions (Addendum)
Follow blood pressure at home every few days.. Call if running < 100/60 or > 150/90.  Keep up great work with chair aerobics.

## 2017-04-23 NOTE — Assessment & Plan Note (Signed)
Stable control on current regimen.

## 2017-04-23 NOTE — Assessment & Plan Note (Signed)
BP on low side today but pt asymptomatic. Follow at home. May need med decrease.

## 2017-04-23 NOTE — Assessment & Plan Note (Signed)
Improved on iron .Marland Kitchen Continue.

## 2017-04-23 NOTE — Progress Notes (Signed)
Subjective:    Patient ID: Elizabeth Silva, female    DOB: 1918/03/11, 83 y.o.   MRN: [id number]  HPI  The patient presents for annual medicare wellness and review of chronic health problems.    anemia: imprvoing Hg  No constipation on ferrous sulfate.   CKD: Stable Cr 1.67  CHF: followed by Dr. Rockey Situ, using lasix prn.  COPD: stable.  Using advair and spiriva..  Has not had to use albuterol lately.   HTN  Too low on metoprolol, hydralazine and lasix.  She is euvolemic on exam today.. She  Denies Chest pain. No dizziness.  Does not feel great today : today is a bad day" BP Readings from Last 3 Encounters:  04/23/17 (!) 100/52  03/07/17 (!) 140/50  03/06/17 (!) 141/58   Elevated Cholesterol: pt continues to want to continue statin. Using medications without problems: Muscle aches:  Diet compliance: Exercise: Other complaints:  hospitalization January 2018, Discharge 11/02/2016.  Presenting with shortness of breath, respiratory failure She presented from nursing home with shortness of breath symptoms, respiratory infection,  started on antibiotics and inhalers an outpatient but symptoms got worse, hypoxic with saturations in the 80s, started on BiPAP in the hospital Diagnosed with multilobar pneumonia, long hospital course Went to [facility], 21 days of rehabilitation, left 11/27/16 Reports that she got flu infection while she was in rehabilitation  Hearing Screening Comments: Wears Bilateral Hearing Aides Vision Screening Comments: Wears Glasses-Eye Exam with Dr. Wallace Going at [facility] in 2017   Fall Risk  04/23/2017 01/10/2016 01/10/2016 01/07/2014 01/02/2013  Falls in the past year? Yes Yes No No No  Number falls in past yr: 2 or more 1 - - -  Injury with Fall? No No - - -   Advance directives and end of life planning reviewed in detail with patient and documented in EMR. Patient given handout on advance care directives if needed. HCPOA and living will updated if  needed.  Social History /Family History/Past Medical History reviewed in detail and updated in EMR if needed. Blood pressure (!) 100/52, pulse 74, temperature 98.7 F (37.1 C), temperature source Oral, height 5' (1.524 m), weight 121 lb 4 oz (55 kg).   She is doing chair aerobics 5 times a week.   Review of Systems  Constitutional: Positive for fatigue. Negative for fever.  HENT: Negative for ear pain.   Eyes: Negative for pain.  Respiratory: Negative for chest tightness and shortness of breath.   Cardiovascular: Negative for chest pain, palpitations and leg swelling.  Gastrointestinal: Negative for abdominal pain.  Genitourinary: Negative for dysuria.       Objective:   Physical Exam  Constitutional: Vital signs are normal. She appears well-developed and well-nourished. She is cooperative.  Non-toxic appearance. She does not appear ill. No distress.  Elderly female with multiple bruises on chin and legs.  HENT:  Head: Normocephalic.  Right Ear: Hearing, tympanic membrane, external ear and ear canal normal.  Left Ear: Hearing, tympanic membrane, external ear and ear canal normal.  Nose: Nose normal.  Eyes: Conjunctivae, EOM and lids are normal. Pupils are equal, round, and reactive to light. Lids are everted and swept, no foreign bodies found.  Neck: Trachea normal and normal range of motion. Neck supple. Carotid bruit is not present. No thyroid mass and no thyromegaly present.  Cardiovascular: Normal rate, regular rhythm, S1 normal, S2 normal and intact distal pulses.  Exam reveals distant heart sounds. Exam reveals no gallop.   Murmur heard.  Systolic murmur is present with a grade of 2/6  Pulmonary/Chest: Effort normal. No respiratory distress. She has decreased breath sounds. She has no wheezes. She has no rhonchi. She has no rales.  Abdominal: Soft. Normal appearance and bowel sounds are normal. She exhibits no distension, no fluid wave, no abdominal bruit and no mass. There is  no hepatosplenomegaly. There is no tenderness. There is no rebound, no guarding and no CVA tenderness. No hernia.  Lymphadenopathy:    She has no cervical adenopathy.    She has no axillary adenopathy.  Neurological: She is alert. She has normal strength. No cranial nerve deficit or sensory deficit.  Skin: Skin is warm, dry and intact. No rash noted.  Psychiatric: Her speech is normal and behavior is normal. Judgment normal. Her mood appears not anxious. Cognition and memory are normal. She does not exhibit a depressed mood.          Assessment & Plan:  The patient's preventative maintenance and recommended screening tests for an annual wellness exam were reviewed in full today. Brought up to date unless services declined.  Counselled on the importance of diet, exercise, and its role in overall health and mortality. The patient's FH and SH was reviewed, including their home life, tobacco status, and drug and alcohol status.   Vaccines: Uptodate with flu, Td, zoster PNA. prevnar. DXA: last in 2013 normal.  No indication for mammo, breast exam, pelvic/pap, colonoscopy at this age. nonsmoker

## 2017-04-23 NOTE — Assessment & Plan Note (Signed)
Pt euvolemic.Marland Kitchen Using lasix prn.

## 2017-04-23 NOTE — Assessment & Plan Note (Signed)
On blood thinner and thin skin.

## 2017-05-18 ENCOUNTER — Other Ambulatory Visit: Payer: Self-pay | Admitting: Cardiovascular Disease

## 2017-07-09 ENCOUNTER — Encounter: Payer: Self-pay | Admitting: Family Medicine

## 2017-07-09 ENCOUNTER — Ambulatory Visit (INDEPENDENT_AMBULATORY_CARE_PROVIDER_SITE_OTHER): Payer: Medicare Other | Admitting: Family Medicine

## 2017-07-09 VITALS — BP 140/60 | HR 88 | Temp 99.0°F | Ht 60.0 in | Wt 123.8 lb

## 2017-07-09 DIAGNOSIS — R300 Dysuria: Secondary | ICD-10-CM | POA: Diagnosis not present

## 2017-07-09 LAB — POCT UA - MICROSCOPIC ONLY

## 2017-07-09 LAB — POC URINALSYSI DIPSTICK (AUTOMATED)
BILIRUBIN UA: NEGATIVE
Blood, UA: NEGATIVE
GLUCOSE UA: NEGATIVE
Ketones, UA: NEGATIVE
NITRITE UA: NEGATIVE
PH UA: 6 (ref 5.0–8.0)
Protein, UA: NEGATIVE
Spec Grav, UA: 1.015 (ref 1.010–1.025)
Urobilinogen, UA: 0.2 E.U./dL

## 2017-07-09 NOTE — Patient Instructions (Signed)
Push water some and continue cranberry. We will call with urine culture results.

## 2017-07-09 NOTE — Progress Notes (Signed)
   Subjective:    Patient ID: Elizabeth Silva, female    DOB: 07/12/1918, 83 y.o.   MRN: [id number]  HPI  83 year old female with history of recurrent UTI.   She has been having dysuria off and on x 1 week. Increase in frequency, no urgency, no blood in urine. Milder central low abd pain. No CVA pain.  No fever.  Using cranberry tablets and water.  No confusion.   No UTI in last year.  Vitals:   07/09/17 1508  BP: 140/60  Pulse: 88  Temp: 99 F (37.2 C)      Review of Systems  Constitutional: Negative for fatigue and fever.  HENT: Negative for ear pain.   Eyes: Negative for pain.  Respiratory: Negative for chest tightness and shortness of breath.   Cardiovascular: Negative for chest pain, palpitations and leg swelling.  Gastrointestinal: Negative for abdominal pain.  Genitourinary: Negative for dysuria.       Objective:   Physical Exam  Constitutional: Vital signs are normal. She appears well-developed and well-nourished. She is cooperative.  Non-toxic appearance. She does not appear ill. No distress.  Elderly female in NAD  HENT:  Head: Normocephalic.  Right Ear: Hearing, tympanic membrane, external ear and ear canal normal. Tympanic membrane is not erythematous, not retracted and not bulging.  Left Ear: Hearing, tympanic membrane, external ear and ear canal normal. Tympanic membrane is not erythematous, not retracted and not bulging.  Nose: No mucosal edema or rhinorrhea. Right sinus exhibits no maxillary sinus tenderness and no frontal sinus tenderness. Left sinus exhibits no maxillary sinus tenderness and no frontal sinus tenderness.  Mouth/Throat: Uvula is midline, oropharynx is clear and moist and mucous membranes are normal.  Eyes: Pupils are equal, round, and reactive to light. Conjunctivae, EOM and lids are normal. Lids are everted and swept, no foreign bodies found.  Neck: Trachea normal and normal range of motion. Neck supple. Carotid bruit is not present.  No thyroid mass and no thyromegaly present.  Cardiovascular: Normal rate, regular rhythm, S1 normal, S2 normal, normal heart sounds, intact distal pulses and normal pulses.  Exam reveals no gallop and no friction rub.   No murmur heard. Pulmonary/Chest: Effort normal and breath sounds normal. No tachypnea. No respiratory distress. She has no decreased breath sounds. She has no wheezes. She has no rhonchi. She has no rales.  Abdominal: Soft. Normal appearance and bowel sounds are normal. There is no tenderness.  Neurological: She is alert.  Skin: Skin is warm, dry and intact. No rash noted.  Psychiatric: Her speech is normal and behavior is normal. Judgment and thought content normal. Her mood appears not anxious. Cognition and memory are normal. She does not exhibit a depressed mood.          Assessment & Plan:

## 2017-07-12 ENCOUNTER — Telehealth: Payer: Self-pay | Admitting: Family Medicine

## 2017-07-12 LAB — URINE CULTURE
MICRO NUMBER: 81060680
SPECIMEN QUALITY: ADEQUATE

## 2017-07-12 MED ORDER — CIPROFLOXACIN HCL 250 MG PO TABS: 250.0000 mg | ORAL_TABLET | Freq: Two times a day (BID) | ORAL | 0 refills | Status: DC

## 2017-07-12 NOTE — Telephone Encounter (Signed)
Collie Siad (daughter) notified by telephone that [name]'s urine culture was positive and Dr. Diona Browner has sent in Cipro to [facility].

## 2017-07-12 NOTE — Telephone Encounter (Signed)
Start antibtioics for abcteria found in urine given she is symptomatic. Sent in cipro.. Let pt know.

## 2017-07-14 NOTE — Assessment & Plan Note (Signed)
Unclear if contamination vs UTI. Pt symptomatic. Send for culture. Push fluids.

## 2017-07-18 DIAGNOSIS — H6521 Chronic serous otitis media, right ear: Secondary | ICD-10-CM | POA: Diagnosis not present

## 2017-07-30 ENCOUNTER — Ambulatory Visit: Payer: Medicare Other | Admitting: Family Medicine

## 2017-08-06 ENCOUNTER — Ambulatory Visit (INDEPENDENT_AMBULATORY_CARE_PROVIDER_SITE_OTHER): Payer: Medicare Other | Admitting: Family Medicine

## 2017-08-06 ENCOUNTER — Encounter: Payer: Self-pay | Admitting: Family Medicine

## 2017-08-06 ENCOUNTER — Other Ambulatory Visit: Payer: Self-pay | Admitting: Family Medicine

## 2017-08-06 VITALS — BP 144/60 | HR 76 | Temp 99.1°F | Ht 60.0 in | Wt 122.2 lb

## 2017-08-06 DIAGNOSIS — M25562 Pain in left knee: Secondary | ICD-10-CM | POA: Diagnosis not present

## 2017-08-06 DIAGNOSIS — I5022 Chronic systolic (congestive) heart failure: Secondary | ICD-10-CM | POA: Diagnosis not present

## 2017-08-06 DIAGNOSIS — Z23 Encounter for immunization: Secondary | ICD-10-CM | POA: Diagnosis not present

## 2017-08-06 DIAGNOSIS — M25561 Pain in right knee: Secondary | ICD-10-CM | POA: Diagnosis not present

## 2017-08-06 DIAGNOSIS — G8929 Other chronic pain: Secondary | ICD-10-CM | POA: Diagnosis not present

## 2017-08-06 DIAGNOSIS — R918 Other nonspecific abnormal finding of lung field: Secondary | ICD-10-CM | POA: Diagnosis not present

## 2017-08-06 DIAGNOSIS — I10 Essential (primary) hypertension: Secondary | ICD-10-CM | POA: Diagnosis not present

## 2017-08-06 MED ORDER — DICLOFENAC SODIUM 1 % TD GEL: 4.0000 g | Freq: Four times a day (QID) | TRANSDERMAL | 0 refills | Status: AC

## 2017-08-06 NOTE — Assessment & Plan Note (Signed)
Concerning for adenocarcinoma. Pt and family to not wish to eval further but these are concerning for lung cancer. Lung exam stable.. Diffuse rhonchi on right.

## 2017-08-06 NOTE — Assessment & Plan Note (Signed)
Well controlled. Continue current medication.  

## 2017-08-06 NOTE — Assessment & Plan Note (Signed)
OA in right knee.  Steroid injection in 5.2018 minimally effective.  Will try course of topical diclofenac and use tramadol prn pain.

## 2017-08-06 NOTE — Telephone Encounter (Signed)
Last office visit 07/09/2017.  Last refilled 06/26/2016 for #90 with 3 refills.  Ok to refill?

## 2017-08-06 NOTE — Progress Notes (Signed)
   Subjective:    Patient ID: Elizabeth Silva, female    DOB: Jan 21, 1918, 83 y.o.   MRN: [id number]  HPI  83 year old female with history of recurrent UTI presents for 3 month follow up and follow up  on recent raoultella planticola UTI .  She was treated on  9/28 with  cipro x 7 days.  She reports today no burning currently. No diarrhea. No fever.  Drink more water and cranberry.  Hypertension:   BP fluctuating.. At tolerable level today in office on current meds. On metoprol as bystolic was too expensive. BP Readings from Last 3 Encounters:  08/06/17 (!) 144/60  07/09/17 140/60  04/23/17 (!) 100/52  Using medication without problems or lightheadedness: none Chest pain with exertion: none Edema: stable, minimal, using lasix prn.. Rarely. Short of breath: stabele Average home BPs: Other issues:   Walking some.  She is at [employer].. Much more active.. Corn hole, uno, bingo.    right knee pain, OA no recent fall  Steroid injection in 02/2017 did not help.  Has never tried diclofenac gel.  Blood pressure (!) 144/60, pulse 76, temperature 99.1 F (37.3 C), temperature source Oral, height 5' (1.524 m), weight 122 lb 4 oz (55.5 kg).   Review of Systems     Objective:   Physical Exam  Constitutional: Vital signs are normal. She appears well-developed and well-nourished. She is cooperative.  Non-toxic appearance. She does not appear ill. No distress.  HENT:  Head: Normocephalic.  Right Ear: Hearing, tympanic membrane, external ear and ear canal normal. Tympanic membrane is not erythematous, not retracted and not bulging.  Left Ear: Hearing, tympanic membrane, external ear and ear canal normal. Tympanic membrane is not erythematous, not retracted and not bulging.  Nose: No mucosal edema or rhinorrhea. Right sinus exhibits no maxillary sinus tenderness and no frontal sinus tenderness. Left sinus exhibits no maxillary sinus tenderness and no frontal sinus tenderness.  Mouth/Throat:  Uvula is midline, oropharynx is clear and moist and mucous membranes are normal.  Eyes: Pupils are equal, round, and reactive to light. Conjunctivae, EOM and lids are normal. Lids are everted and swept, no foreign bodies found.  Neck: Trachea normal and normal range of motion. Neck supple. Carotid bruit is not present. No thyroid mass and no thyromegaly present.  Cardiovascular: Normal rate, regular rhythm, S1 normal, S2 normal, normal heart sounds, intact distal pulses and normal pulses.  Exam reveals no gallop and no friction rub.   No murmur heard. Pulmonary/Chest: Effort normal. No tachypnea. No respiratory distress. She has decreased breath sounds. She has no wheezes. She has rhonchi in the right middle field, the right lower field and the left lower field. She has no rales.  Abdominal: Soft. Normal appearance and bowel sounds are normal. There is no tenderness.  Musculoskeletal:       Right knee: She exhibits decreased range of motion, swelling and effusion. She exhibits no bony tenderness, normal meniscus and no MCL laxity. Tenderness found. Medial joint line and lateral joint line tenderness noted. No MCL, no LCL and no patellar tendon tenderness noted.  Neurological: She is alert.  Skin: Skin is warm, dry and intact. No rash noted.  Psychiatric: Her speech is normal and behavior is normal. Judgment and thought content normal. Her mood appears not anxious. Cognition and memory are normal. She does not exhibit a depressed mood.          Assessment & Plan:

## 2017-08-06 NOTE — Assessment & Plan Note (Signed)
Currently euvolemic.

## 2017-08-07 ENCOUNTER — Telehealth: Payer: Self-pay | Admitting: *Deleted

## 2017-08-07 NOTE — Telephone Encounter (Signed)
Diclofenac Gel 1% approved through 10/14/2018.

## 2017-08-07 NOTE — Telephone Encounter (Signed)
Received fax from [facility] requesting PA for Diclofenac Gel.  PA completed on CoverMyMeds. Sent for review.  Awaiting decision.  Can take up to 72 hours for a response.

## 2017-08-14 ENCOUNTER — Telehealth: Payer: Self-pay | Admitting: Family Medicine

## 2017-08-14 NOTE — Telephone Encounter (Signed)
Last OV 08/08/2017. Last Rx 10/2016

## 2017-08-14 NOTE — Telephone Encounter (Signed)
Copied from [facility] #[id number]. Topic: Quick Communication - See Telephone Encounter >> Aug 14, 2017 10:56 AM Patrice Paradise wrote: CRM for notification.   Patient called and stated that she needs a new RX for her Tramadol 50 mg.  See Telephone encounter for: 08/14/17.

## 2017-08-14 NOTE — Telephone Encounter (Signed)
Please review for refill.  

## 2017-08-15 MED ORDER — TRAMADOL HCL 50 MG PO TABS: 50.0000 mg | ORAL_TABLET | Freq: Three times a day (TID) | ORAL | 0 refills | Status: DC | PRN

## 2017-08-15 NOTE — Telephone Encounter (Signed)
[facility]  Rx faxed to [facility] [phone].

## 2017-08-21 ENCOUNTER — Other Ambulatory Visit: Payer: Self-pay | Admitting: Family Medicine

## 2017-09-11 ENCOUNTER — Encounter: Payer: Self-pay | Admitting: Cardiovascular Disease

## 2017-09-11 ENCOUNTER — Ambulatory Visit: Payer: Medicare Other | Admitting: Cardiovascular Disease

## 2017-09-11 VITALS — BP 140/52 | HR 76 | Ht 61.0 in | Wt 124.2 lb

## 2017-09-11 DIAGNOSIS — R011 Cardiac murmur, unspecified: Secondary | ICD-10-CM

## 2017-09-11 DIAGNOSIS — I5032 Chronic diastolic (congestive) heart failure: Secondary | ICD-10-CM | POA: Diagnosis not present

## 2017-09-11 NOTE — Patient Instructions (Signed)

## 2017-09-11 NOTE — Progress Notes (Signed)
Cardiology Office Note  Date:  09/11/2017   ID:  Elizabeth Silva, DOB November 22, 1917, MRN [id number]  PCP:  Jinny Sanders, MD   Chief Complaint  Patient presents with  . other    6 month follow up. Meds reviewed by the pt.'s daughter. "doing well."     HPI:  83 yo with history of  malignant HTN,  COPD,  hyperlipidemia,  left bundle-branch block,  moderate tricuspid valve regurgitation since 2011 with normal ejection fraction in 2012,  Ophthalmologist feels her vision loss is secondary to atherosclerotic disease and hypertension. vision loss in her left eye. 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 carotid arterial disease presents today for routine followup of her hypertension, Leg swelling    living at [facility] nursing home Presents today with her daughter  From her last office visit  bystolic was changed to metoprolol succinate 25 mg daily Taking Lasix sparingly  Denies any leg swelling  drinks are thickened  except for water   walking with a walker, no recent falls  no significant change in the past year Trigger finger left hand  EKG personally reviewed by myself on todays visit Shows normal sinus rhythm with left bundle branch block no significant change in EKG  Other past medical history reviewed  hospitalization January 2018, Discharge 11/02/2016.  Presenting with shortness of breath, respiratory failure She presented from nursing home with shortness of breath symptoms, respiratory infection,  started on antibiotics and inhalers an outpatient but symptoms got worse, hypoxic with saturations in the 80s, started on BiPAP in the hospital Diagnosed with multilobar pneumonia, long hospital course Went to [facility], 21 days of rehabilitation, left 11/27/16 Reports that she got flu infection while she was in rehabilitation  Previous CT scan of her chest for possible pneumonia shows diffuse aortic atherosclerosis, coronary artery disease noted in the proximal  LAD  Vision continues to cause major issues, some progression  She does report having myalgias on simvastatin in the past  Chronic fatigue/poor energy poor several years. She's not using her cane.  Previous hospital admission for sepsis, recurrent pneumonia, diagnosed with C. Difficile.  treated with Flagyl and diarrhea improved. Notes from the hospital indicate Klebsiella urinary tract infection treated with antibiotics, C. difficile improving on Flagyl, bradycardia, recurrent pneumonia that was improving on antibiotics, sepsis that was treated and improved, chronic kidney disease.    Previous  Echo was suggestive of diastolic dysfunction with normal EF (60-65%). Moderate TR     PMH:   has a past medical history of Allergic rhinitis, CKD (chronic kidney disease), COPD (chronic obstructive pulmonary disease) ([place]), Diastolic CHF, acute ([place]), Diverticulosis of colon, GERD (gastroesophageal reflux disease), GI bleed, H/O Clostridium difficile infection (Sept. 2013), History of PFTs, Hyperlipidemia, Hypertension, Impaired vision, LBBB (left bundle branch block), and Osteoarthritis.  PSH:    Past Surgical History:  Procedure Laterality Date  . CARDIAC CATHETERIZATION  1999   normal  . CERVICAL [place] SURGERY  2005   cerv. disc, dz. C5-C7, facet arthritis  . CHOLECYSTECTOMY  1980  . TOTAL ABDOMINAL HYSTERECTOMY  1970   suspicious cells    Current Outpatient Medications  Medication Sig Dispense Refill  . ADVAIR DISKUS 250-50 MCG/DOSE AEPB INHALE 1 PUFF INTO THE  LUNGS TWO TIMES DAILY 180 each 3  . aspirin 81 MG EC tablet Take 81 mg by mouth daily.      . diclofenac sodium (VOLTAREN) 1 % GEL Apply 4 g topically 4 (four) times daily. 300 g 0  .  diphenhydramine-acetaminophen (TYLENOL PM) 25-500 MG TABS tablet Take 1 tablet by mouth at bedtime as needed.    . ferrous fumarate (HEMOCYTE - 106 MG FE) 325 (106 FE) MG TABS tablet Take 1 tablet (106 mg of iron total) by mouth daily. 90 each 3  .  fluticasone (FLONASE) 50 MCG/ACT nasal spray USE 2 SPRAYS IN EACH  NOSTRIL DAILY 48 g 0  . furosemide (LASIX) 20 MG tablet Take 20 mg by mouth daily as needed.    . gabapentin (NEURONTIN) 600 MG tablet TAKE 1 TABLET BY MOUTH AT  BEDTIME 90 tablet 1  . hydrALAZINE (APRESOLINE) 25 MG tablet Take 1 tablet (25 mg total) by mouth 3 (three) times daily. 270 tablet 3  . ipratropium (ATROVENT) 0.03 % nasal spray Place 2 sprays into both nostrils every 12 (twelve) hours. 30 mL 12  . Lactobacillus Rhamnosus, GG, (CULTURELLE PO) Take 1 tablet by mouth 2 (two) times daily.    Marland Kitchen lovastatin (MEVACOR) 20 MG tablet TAKE 1 TABLET BY MOUTH AT  BEDTIME 90 tablet 3  . metoprolol succinate (TOPROL-XL) 25 MG 24 hr tablet Take 1 tablet (25 mg total) by mouth daily. Take with or immediately following a meal. 90 tablet 3  . omeprazole (PRILOSEC) 40 MG capsule TAKE 1 CAPSULE BY MOUTH  DAILY 90 capsule 1  . PROAIR HFA 108 (90 Base) MCG/ACT inhaler INHALE 2 PUFFS EVERY 6  HOURS AS NEEDED FOR  WHEEZING OR SHORTNESS OF  BREATH. 34 g 2  . SPIRIVA HANDIHALER 18 MCG inhalation capsule INHALE THE CONTENTS OF 1  CAPSULE VIA HANDIHALER  DAILY 90 capsule 3  . traMADol (ULTRAM) 50 MG tablet Take 1 tablet (50 mg total) by mouth every 8 (eight) hours as needed for severe pain. 30 tablet 0  . vitamin E 1000 UNIT capsule Take 1,000 Units by mouth daily.       No current facility-administered medications for this visit.      Allergies:   Levofloxacin and Penicillins   Social History:  The patient  reports that she quit smoking about 32 years ago. Her smoking use included cigarettes. She smoked 1.00 pack per day. she has never used smokeless tobacco. She reports that she drinks alcohol. She reports that she does not use drugs.   Family History:   family history includes Cancer in her brother; Colon cancer in her sister; Dementia in her father and sister; Diabetes in her brother; Heart block in her mother; Lung cancer in her brother.     Review of Systems: Review of Systems  Respiratory: Negative.   Cardiovascular: Negative.   Gastrointestinal: Negative.   Musculoskeletal: Negative.        Unsteady gait  Psychiatric/Behavioral: Negative.   All other systems reviewed and are negative.    PHYSICAL EXAM: VS:  BP (!) 140/52 (BP Location: Left Arm, Patient Position: Sitting, Cuff Size: Normal)   Pulse 76   Ht 5\' 1"  (1.549 m)   Wt 124 lb 4 oz (56.4 kg)   BMI 23.48 kg/m  , BMI Body mass index is 23.48 kg/m.  GEN: Frail, walking with a walker  HEENT: normal  Neck: no JVD, carotid bruits, or masses Cardiac: RRR; no murmurs, rubs, or gallops,, trace lower extremity edema around the ankles Respiratory:  clear to auscultation bilaterally, normal work of breathing GI: soft, nontender, nondistended, + BS MS: no deformity or atrophy  Skin: warm and dry, no rash Neuro:  Strength and sensation are intact Psych: euthymic mood, full affect  Recent Labs: 10/28/2016: B Natriuretic Peptide 1,008.0 10/29/2016: Magnesium 2.2 04/18/2017: ALT 6; BUN 38; Creatinine, Ser 1.67; Hemoglobin 11.9; Platelets 236.0; Potassium 4.4; Sodium 141    Lipid Panel Lab Results  Component Value Date   CHOL 135 01/10/2016   HDL 40.90 01/10/2016   LDLCALC 60 01/10/2016   TRIG 168.0 (H) 01/10/2016      Wt Readings from Last 3 Encounters:  09/11/17 124 lb 4 oz (56.4 kg)  08/06/17 122 lb 4 oz (55.5 kg)  07/09/17 123 lb 12 oz (56.1 kg)       ASSESSMENT AND PLAN:  HYPERCHOLESTEROLEMIA, PURE - Plan: EKG 12-Lead Cholesterol is at goal on the current lipid regimen. No changes to the medications were made.  Stable  Essential hypertension, benign - Plan: EKG 12-Lead Blood pressure is well controlled on today's visit. No changes made to the medications.  DIASTOLIC HEART FAILURE, CHRONIC - Plan: EKG 12-Lead Recommended Lasix as needed for ankle swelling, weight gain, shortness of breath  minimal edema, stable   Chronic venous  insufficiency Minimal ankle swelling  Does not use compression hose  Chronic kidney disease, stage 4, severely decreased GFR (HCC) Creatinine 1. 6, around her baseline Stable    Total encounter time more than 25 minutes  Greater than 50% was spent in counseling and coordination of care with the patient   Disposition:   F/U  12 months   Orders Placed This Encounter  Procedures  . EKG 12-Lead     Signed, Esmond Plants, M.D., Ph.D. 09/11/2017  [place], [place]

## 2017-09-19 ENCOUNTER — Encounter: Payer: Self-pay | Admitting: Family Medicine

## 2017-09-19 ENCOUNTER — Ambulatory Visit (INDEPENDENT_AMBULATORY_CARE_PROVIDER_SITE_OTHER): Payer: Medicare Other | Admitting: Family Medicine

## 2017-09-19 ENCOUNTER — Other Ambulatory Visit: Payer: Self-pay

## 2017-09-19 VITALS — BP 130/50 | HR 71 | Temp 98.6°F | Ht 60.0 in | Wt 123.5 lb

## 2017-09-19 DIAGNOSIS — M1711 Unilateral primary osteoarthritis, right knee: Secondary | ICD-10-CM | POA: Diagnosis not present

## 2017-09-19 MED ORDER — METHYLPREDNISOLONE ACETATE 40 MG/ML IJ SUSP
80.0000 mg | Freq: Once | INTRAMUSCULAR | Status: AC
  Administered 2017-09-19: 80 mg via INTRA_ARTICULAR

## 2017-09-19 NOTE — Progress Notes (Signed)
Dr. Frederico Hamman T. Britiney Blahnik, MD, [facility] [place], [address] Phone: [phone] Fax: [phone]  09/19/2017  Patient: Elizabeth Silva, MRN: [id number], DOB: 1918/06/26, 83 y.o.  Primary Physician:  Jinny Sanders, MD   Chief Complaint  Patient presents with  . Knee Pain    Right   Subjective:   Elizabeth Silva is a 83 y.o. very pleasant female patient who presents with the following:  R knee pain: OA  Pleasant elderly patient who presents with ongoing right knee pain and osteoarthritis.  She does also have chronic kidney disease stage IV, congestive heart failure, and she is currently taking some tramadol intermittently as well as some Voltaren gel.  She thinks that these help a little bit.  I have injected her knee in the past, which provided some relief.  Steroid injection, R knee  Past Medical History, Surgical History, Social History, Family History, Problem List, Medications, and Allergies have been reviewed and updated if relevant.  Patient Active Problem List   Diagnosis Date Noted  . Senile purpura ([place]) 04/23/2017  . Trigger finger 03/04/2017  . Gout 11/23/2016  . Chronic systolic (congestive) heart failure ([place]) 11/02/2016  . Goals of care, counseling/discussion   . Palliative care encounter   . Upper GI bleed 04/26/2016  . Multiple pulmonary nodules 04/12/2016  . Chronic constipation 04/12/2016  . Abnormal chest x-ray 01/12/2016  . Right knee pain 01/10/2016  . Advance directive discussed with patient 01/10/2016  . Abnormal swallowing 10/27/2015  . Vision loss, bilateral 10/26/2014  . Carotid stenosis 10/26/2014  . Bilateral tinnitus 04/20/2014  . Prediabetes 01/07/2014  . Bilateral knee pain 01/07/2014  . Hip pain, left 01/07/2014  . Insomnia 01/02/2013  . Anemia of chronic renal failure 12/25/2010  . CONSTIPATION 12/08/2010  . DIASTOLIC HEART FAILURE, CHRONIC 03/02/2010  . INCONTINENCE, URGE  01/25/2010  . PERIPHERAL NEUROPATHY, IDIOPATHIC 05/10/2008  . PERIPHERAL VASCULAR DISEASE 05/05/2007  . Chronic venous insufficiency 05/05/2007  . ALLERGIC RHINITIS 05/05/2007  . COPD, mild ([place]) 05/05/2007  . GERD 05/05/2007  . DIVERTICULOSIS, COLON 05/05/2007  . Osteoarthritis, multiple sites 05/05/2007  . DEGENERATION, CERVICAL DISC 05/05/2007  . STRESS INCONTINENCE 05/05/2007  . HYPERCHOLESTEROLEMIA, PURE 04/07/2007  . RENAL STONE 04/07/2007  . Essential hypertension, benign 03/17/2007  . Chronic kidney disease, stage 4, severely decreased GFR ([place]) 03/17/2007  . CARDIAC MURMUR 03/17/2007    Past Medical History:  Diagnosis Date  . Allergic rhinitis   . CKD (chronic kidney disease)   . COPD (chronic obstructive pulmonary disease) ([place])   . Diastolic CHF, acute ([facility])    echo (4/11) with EF 60-65%,mild LVH, mild aortic insufficiency, mild MR, PA systolic pressure 18-29 mmHg, mild to moderate TR  . Diverticulosis of colon   . GERD (gastroesophageal reflux disease)   . GI bleed   . H/O Clostridium difficile infection Sept. 2013  . History of PFTs   . Hyperlipidemia   . Hypertension    LE swelling with amlodipine, intolerant of ACEIs  . Impaired vision    right eye, was told due to HTN  . LBBB (left bundle branch block)   . Osteoarthritis     Past Surgical History:  Procedure Laterality Date  . CARDIAC CATHETERIZATION  1999   normal  . CERVICAL [place] SURGERY  2005   cerv. disc, dz. C5-C7, facet arthritis  . CHOLECYSTECTOMY  1980  . TOTAL ABDOMINAL HYSTERECTOMY  1970   suspicious cells  Social History   Socioeconomic History  . Marital status: Widowed    Spouse name: Not on file  . Number of children: Not on file  . Years of education: Not on file  . Highest education level: Not on file  Social Needs  . Financial resource strain: Not on file  . Food insecurity - worry: Not on file  . Food insecurity - inability: Not on file  . Transportation needs -  medical: Not on file  . Transportation needs - non-medical: Not on file  Occupational History  . Not on file  Tobacco Use  . Smoking status: Former Smoker    Packs/day: 1.00    Types: Cigarettes    Last attempt to quit: 10/15/1984    Years since quitting: 32.9  . Smokeless tobacco: Never Used  Substance and Sexual Activity  . Alcohol use: Yes    Alcohol/week: 0.0 oz  . Drug use: No  . Sexual activity: Not on file  Other Topics Concern  . Not on file  Social History Narrative   No living will, HCPOA: daughter Wilburn Mylar    Family History  Problem Relation Age of Onset  . Heart block Mother        complete  . Dementia Father   . Colon cancer Sister   . Dementia Sister   . Lung cancer Brother   . Cancer Brother        sinus  . Diabetes Brother     Allergies  Allergen Reactions  . Levofloxacin Other (See Comments)    REACTION: Burning in legs  . Penicillins Rash and Other (See Comments)    Patient unable to answer follow up questions at this time    Medication list reviewed and updated in full in [facility].  GEN: No fevers, chills. Nontoxic. Primarily MSK c/o today. MSK: Detailed in the HPI GI: tolerating PO intake without difficulty Neuro: No numbness, parasthesias, or tingling associated. Otherwise the pertinent positives of the ROS are noted above.   Objective:   BP (!) 130/50   Pulse 71   Temp 98.6 F (37 C) (Oral)   Ht 5' (1.524 m)   Wt 123 lb 8 oz (56 kg)   SpO2 95%   BMI 24.12 kg/m    GEN: WDWN, NAD, Non-toxic, Alert & Oriented x 3 HEENT: Atraumatic, Normocephalic.  Ears and Nose: No external deformity. EXTR: No clubbing/cyanosis/edema NEURO: Normal gait.  PSYCH: Normally interactive. Conversant. Not depressed or anxious appearing.  Calm demeanor.    Examined while seated.  Patient does have a mild valgus orientation at the knee.  She has some mild tenderness on the medial and lateral joint line.  Stable ACL and PCL.  MCL and LCL are also  stable.  She does have tenderness with flexion pinch as well as McMurray's.  Radiology: No results found.  Assessment and Plan:   Primary osteoarthritis of right knee - Plan: methylPREDNISolone acetate (DEPO-MEDROL) injection 80 mg  I am going to do an intra-articular knee injection for the patient today.  If she does want to do Visco supplementation, and her daughter brought this up today in the office, we would need to bill for authorization to determine which product her insurance would favor.  Knee Injection, R Patient verbally consented to procedure. Risks (including potential rare risk of infection), benefits, and alternatives explained. Sterilely prepped with Chloraprep. Ethyl cholride used for anesthesia. 8 cc Lidocaine 1% mixed with 2 mL Depo-Medrol 40 mg injected using the  anteromedial approach without difficulty. No complications with procedure and tolerated well. Patient had decreased pain post-injection.   Follow-up: No Follow-up on file.  Future Appointments  Date Time Provider [place]  11/08/2017  4:00 PM Jinny Sanders, MD [facility]    Meds ordered this encounter  Medications  . methylPREDNISolone acetate (DEPO-MEDROL) injection 80 mg   Signed,  Jamar Weatherall T. Mikeyla Music, MD   Allergies as of 09/19/2017      Reactions   Levofloxacin Other (See Comments)   REACTION: Burning in legs   Penicillins Rash, Other (See Comments)   Patient unable to answer follow up questions at this time      Medication List        Accurate as of 09/19/17  1:43 PM. Always use your most recent med list.          ADVAIR DISKUS 250-50 MCG/DOSE Aepb Generic drug:  Fluticasone-Salmeterol INHALE 1 PUFF INTO THE  LUNGS TWO TIMES DAILY   aspirin 81 MG EC tablet Take 81 mg by mouth daily.   CULTURELLE PO Take 1 tablet by mouth 2 (two) times daily.   diclofenac sodium 1 % Gel Commonly known as:  VOLTAREN Apply 4 g topically 4 (four) times daily.     diphenhydramine-acetaminophen 25-500 MG Tabs tablet Commonly known as:  TYLENOL PM Take 1 tablet by mouth at bedtime as needed.   ferrous fumarate 325 (106 Fe) MG Tabs tablet Commonly known as:  HEMOCYTE - 106 mg FE Take 1 tablet (106 mg of iron total) by mouth daily.   fluticasone 50 MCG/ACT nasal spray Commonly known as:  FLONASE USE 2 SPRAYS IN EACH  NOSTRIL DAILY   furosemide 20 MG tablet Commonly known as:  LASIX Take 20 mg by mouth daily as needed.   gabapentin 600 MG tablet Commonly known as:  NEURONTIN TAKE 1 TABLET BY MOUTH AT  BEDTIME   hydrALAZINE 25 MG tablet Commonly known as:  APRESOLINE Take 1 tablet (25 mg total) by mouth 3 (three) times daily.   ipratropium 0.03 % nasal spray Commonly known as:  ATROVENT Place 2 sprays into both nostrils every 12 (twelve) hours.   lovastatin 20 MG tablet Commonly known as:  MEVACOR TAKE 1 TABLET BY MOUTH AT  BEDTIME   metoprolol succinate 25 MG 24 hr tablet Commonly known as:  TOPROL-XL Take 1 tablet (25 mg total) by mouth daily. Take with or immediately following a meal.   omeprazole 40 MG capsule Commonly known as:  PRILOSEC TAKE 1 CAPSULE BY MOUTH  DAILY   PROAIR HFA 108 (90 Base) MCG/ACT inhaler Generic drug:  albuterol INHALE 2 PUFFS EVERY 6  HOURS AS NEEDED FOR  WHEEZING OR SHORTNESS OF  BREATH.   SPIRIVA HANDIHALER 18 MCG inhalation capsule Generic drug:  tiotropium INHALE THE CONTENTS OF 1  CAPSULE VIA HANDIHALER  DAILY   traMADol 50 MG tablet Commonly known as:  ULTRAM Take 1 tablet (50 mg total) by mouth every 8 (eight) hours as needed for severe pain.   vitamin E 1000 UNIT capsule Take 1,000 Units by mouth daily.

## 2017-10-01 ENCOUNTER — Other Ambulatory Visit: Payer: Self-pay | Admitting: Cardiovascular Disease

## 2017-10-03 ENCOUNTER — Other Ambulatory Visit: Payer: Self-pay | Admitting: Family Medicine

## 2017-10-03 NOTE — Telephone Encounter (Signed)
Pt  Requesting a  Refill  Of   ULTRAM

## 2017-10-03 NOTE — Telephone Encounter (Signed)
Copied from [place] [id number]. Topic: Quick Communication - Rx Refill/Question >> Oct 03, 2017 10:52 AM Synthia Innocent wrote: Has the patient contacted their pharmacy? No. Said with [facility] she has to contact ddr   (Agent: If no, request that the patient contact the pharmacy for the refill.)   Preferred Pharmacy (with phone number or street name): [facility]   Agent: Please be advised that RX refills may take up to 3 business days. We ask that you follow-up with your pharmacy. Requesting refill on traMADol (ULTRAM) 50 MG tablet. Requesting 60 tabs. Please advise

## 2017-10-03 NOTE — Telephone Encounter (Signed)
Last Rx 08/15/2017. Last OV 09/2017

## 2017-10-04 MED ORDER — TRAMADOL HCL 50 MG PO TABS: 50.0000 mg | ORAL_TABLET | Freq: Three times a day (TID) | ORAL | 0 refills | Status: DC | PRN

## 2017-10-04 NOTE — Telephone Encounter (Signed)
Tramadol Rx faxed to [facility] at [phone].

## 2017-11-08 ENCOUNTER — Ambulatory Visit (INDEPENDENT_AMBULATORY_CARE_PROVIDER_SITE_OTHER): Payer: Medicare Other | Admitting: Family Medicine

## 2017-11-08 ENCOUNTER — Other Ambulatory Visit: Payer: Self-pay

## 2017-11-08 ENCOUNTER — Encounter: Payer: Self-pay | Admitting: Family Medicine

## 2017-11-08 VITALS — BP 138/60 | HR 81 | Temp 98.9°F | Ht 60.0 in | Wt 124.0 lb

## 2017-11-08 DIAGNOSIS — N182 Chronic kidney disease, stage 2 (mild): Secondary | ICD-10-CM | POA: Diagnosis not present

## 2017-11-08 DIAGNOSIS — M25561 Pain in right knee: Secondary | ICD-10-CM | POA: Diagnosis not present

## 2017-11-08 DIAGNOSIS — M25562 Pain in left knee: Secondary | ICD-10-CM | POA: Diagnosis not present

## 2017-11-08 DIAGNOSIS — N184 Chronic kidney disease, stage 4 (severe): Secondary | ICD-10-CM | POA: Diagnosis not present

## 2017-11-08 DIAGNOSIS — G8929 Other chronic pain: Secondary | ICD-10-CM | POA: Diagnosis not present

## 2017-11-08 DIAGNOSIS — N183 Chronic kidney disease, stage 3 unspecified: Secondary | ICD-10-CM

## 2017-11-08 DIAGNOSIS — J449 Chronic obstructive pulmonary disease, unspecified: Secondary | ICD-10-CM

## 2017-11-08 DIAGNOSIS — I5022 Chronic systolic (congestive) heart failure: Secondary | ICD-10-CM

## 2017-11-08 MED ORDER — TRAMADOL HCL 50 MG PO TABS: 50.0000 mg | ORAL_TABLET | Freq: Two times a day (BID) | ORAL | 0 refills | Status: DC | PRN

## 2017-11-08 MED ORDER — FLUTICASONE-SALMETEROL 250-50 MCG/DOSE IN AEPB: INHALATION_SPRAY | RESPIRATORY_TRACT | 3 refills | Status: AC

## 2017-11-08 NOTE — Assessment & Plan Note (Signed)
Euvolemic on current regimen.

## 2017-11-08 NOTE — Assessment & Plan Note (Signed)
Refilled tramadol . Continue voltaren gel.

## 2017-11-08 NOTE — Assessment & Plan Note (Signed)
Stable .Marland Kitchen Refilled advair and spiriva.

## 2017-11-08 NOTE — Progress Notes (Signed)
   Subjective:    Patient ID: Elizabeth Silva, female    DOB: [birth year], 84 y.o.   MRN: [id number]  HPI  84 year old female presents for  3 month follow up.   Using voltaren gel and tramadol ( using 1-2 a day) for pain in right knee. Got a little better with steroid injection.  needs refill of tramadol for pain.   Hypertension:    Due for eval BMET. BP Readings from Last 3 Encounters:  11/08/17 138/60  09/19/17 (!) 130/50  09/11/17 (!) 140/52  Using medication without problems or lightheadedness:  none Chest pain with exertion: none Edema:none Short of breath: none Average home BPs: 130/70s Other issues:  Blood pressure 138/60, pulse 81, temperature 98.9 F (37.2 C), temperature source Oral, height 5' (1.524 m), weight 124 lb (56.2 kg), SpO2 93 %.   Review of Systems  Constitutional: Negative for fatigue and fever.  HENT: Negative for congestion and ear pain.   Eyes: Negative for pain.  Respiratory: Negative for cough, chest tightness and shortness of breath.   Cardiovascular: Negative for chest pain, palpitations and leg swelling.  Gastrointestinal: Negative for abdominal pain.  Genitourinary: Negative for dysuria and vaginal bleeding.  Musculoskeletal: Negative for back pain.  Neurological: Negative for syncope, light-headedness and headaches.  Psychiatric/Behavioral: Negative for dysphoric mood.       Objective:   Physical Exam  Constitutional: Vital signs are normal. She appears well-developed and well-nourished. She is cooperative.  Non-toxic appearance. She does not appear ill. No distress.  HENT:  Head: Normocephalic.  Right Ear: Hearing, tympanic membrane, external ear and ear canal normal. Tympanic membrane is not erythematous, not retracted and not bulging.  Left Ear: Hearing, tympanic membrane, external ear and ear canal normal. Tympanic membrane is not erythematous, not retracted and not bulging.  Nose: No mucosal edema or rhinorrhea. Right sinus exhibits no  maxillary sinus tenderness and no frontal sinus tenderness. Left sinus exhibits no maxillary sinus tenderness and no frontal sinus tenderness.  Mouth/Throat: Uvula is midline, oropharynx is clear and moist and mucous membranes are normal.  Eyes: Conjunctivae, EOM and lids are normal. Pupils are equal, round, and reactive to light. Lids are everted and swept, no foreign bodies found.  Neck: Trachea normal and normal range of motion. Neck supple. Carotid bruit is not present. No thyroid mass and no thyromegaly present.  Cardiovascular: Normal rate, regular rhythm, S1 normal, S2 normal, normal heart sounds, intact distal pulses and normal pulses. Exam reveals no gallop and no friction rub.  No murmur heard. Pulmonary/Chest: Effort normal. No tachypnea. No respiratory distress. She has no decreased breath sounds. She has no wheezes. She has rhonchi in the left upper field. She has no rales.  Abdominal: Soft. Normal appearance and bowel sounds are normal. There is no tenderness.  Musculoskeletal:       Right knee: She exhibits decreased range of motion, swelling and effusion. She exhibits no bony tenderness, normal meniscus and no MCL laxity. Tenderness found. Medial joint line and lateral joint line tenderness noted. No MCL, no LCL and no patellar tendon tenderness noted.  Neurological: She is alert.  Skin: Skin is warm, dry and intact. No rash noted.  Psychiatric: Her speech is normal and behavior is normal. Judgment and thought content normal. Her mood appears not anxious. Cognition and memory are normal. She does not exhibit a depressed mood.          Assessment & Plan:

## 2017-11-08 NOTE — Patient Instructions (Addendum)
Please stop at the lab to have labs drawn.  

## 2017-11-08 NOTE — Assessment & Plan Note (Signed)
Due for re-eval. 

## 2017-11-09 LAB — BASIC METABOLIC PANEL
BUN/Creatinine Ratio: 21 (calc) (ref 6–22)
BUN: 41 mg/dL — ABNORMAL HIGH (ref 7–25)
CALCIUM: 9.1 mg/dL (ref 8.6–10.4)
CHLORIDE: 109 mmol/L (ref 98–110)
CO2: 26 mmol/L (ref 20–32)
Creat: 1.95 mg/dL — ABNORMAL HIGH (ref 0.60–0.88)
GLUCOSE: 98 mg/dL (ref 65–99)
Potassium: 4.7 mmol/L (ref 3.5–5.3)
Sodium: 144 mmol/L (ref 135–146)

## 2017-11-11 ENCOUNTER — Telehealth: Payer: Self-pay | Admitting: *Deleted

## 2017-11-11 MED ORDER — TRAMADOL HCL 50 MG PO TABS: 50.0000 mg | ORAL_TABLET | Freq: Two times a day (BID) | ORAL | 2 refills | Status: DC | PRN

## 2017-11-11 NOTE — Telephone Encounter (Signed)
Received fax from [facility] that states: To support safe use of opioid medications, [facility] will no longer dispense more that a 30 day supply at one time.  To align with this effort, the dispensed quantity for Tramadol 50 mg tab dated 11/08/2017 has been reduced for #180 to #60.  The partial quantity remaining is processed as 30 day supply refills for the limit available on the prescriptions or as restricted by state law.  So for future refills, we will need to send in for #30 with 2 refills.  I have updated her medication list.

## 2017-12-10 ENCOUNTER — Telehealth: Payer: Self-pay | Admitting: Family Medicine

## 2017-12-10 NOTE — Telephone Encounter (Signed)
Copied from [place]. Topic: Quick Communication - See Telephone Encounter >> Dec 10, 2017 11:51 AM Cleaster Corin, NT wrote: CRM for notification. See Telephone encounter for:   12/10/17. Pt. Daughter Sharon Seller calling to speak with Butch Penny Dr. Diona Browner nurse. Asked daughter if I could get reason for call she stated that she would just like for Butch Penny to call her back. Offered NT but daughter wants to speak with Butch Penny about pt. (chest congestion).

## 2017-12-10 NOTE — Telephone Encounter (Signed)
Spoke with Elizabeth Silva.  She states Elizabeth Silva is in assisted living.  The nurse there called her this morning stating Elizabeth Silva is complaining of congestion and tightness in chest.  She is coughing up green phlegm.  She did have her mom to start Mucinex today.  Elizabeth Silva states they usually need a course of prednisone to clear this up.  She is wanting to know if Dr. Diona Browner would be willing to prescribe prednisone for patient or will she need to be seen.

## 2017-12-10 NOTE — Telephone Encounter (Signed)
Collie Siad notified as instructed by telephone.  Appointment scheduled for 12/11/2017 at 10:15 am with Dr. Diona Browner.

## 2017-12-10 NOTE — Telephone Encounter (Signed)
Needs to be seen please given concern for flu, bacterial infection etc.

## 2017-12-11 ENCOUNTER — Encounter: Payer: Self-pay | Admitting: Family Medicine

## 2017-12-11 ENCOUNTER — Other Ambulatory Visit: Payer: Self-pay

## 2017-12-11 ENCOUNTER — Ambulatory Visit (INDEPENDENT_AMBULATORY_CARE_PROVIDER_SITE_OTHER): Payer: Medicare Other | Admitting: Family Medicine

## 2017-12-11 ENCOUNTER — Ambulatory Visit (INDEPENDENT_AMBULATORY_CARE_PROVIDER_SITE_OTHER)
Admission: RE | Admit: 2017-12-11 | Discharge: 2017-12-11 | Disposition: A | Discharge status: Home / Self Care | Payer: Medicare Other | Source: Ambulatory Visit | Attending: Family Medicine | Admitting: Family Medicine

## 2017-12-11 ENCOUNTER — Telehealth: Payer: Self-pay | Admitting: Family Medicine

## 2017-12-11 VITALS — BP 152/60 | HR 79 | Temp 98.6°F | Ht 60.0 in | Wt 120.0 lb

## 2017-12-11 DIAGNOSIS — R0602 Shortness of breath: Secondary | ICD-10-CM

## 2017-12-11 DIAGNOSIS — R918 Other nonspecific abnormal finding of lung field: Secondary | ICD-10-CM

## 2017-12-11 DIAGNOSIS — J449 Chronic obstructive pulmonary disease, unspecified: Secondary | ICD-10-CM

## 2017-12-11 DIAGNOSIS — J441 Chronic obstructive pulmonary disease with (acute) exacerbation: Secondary | ICD-10-CM

## 2017-12-11 DIAGNOSIS — R05 Cough: Secondary | ICD-10-CM | POA: Diagnosis not present

## 2017-12-11 DIAGNOSIS — C3490 Malignant neoplasm of unspecified part of unspecified bronchus or lung: Secondary | ICD-10-CM

## 2017-12-11 MED ORDER — PREDNISONE 10 MG PO TABS: ORAL_TABLET | ORAL | 0 refills | Status: DC

## 2017-12-11 MED ORDER — AZITHROMYCIN 250 MG PO TABS: ORAL_TABLET | ORAL | 0 refills | Status: DC

## 2017-12-11 NOTE — Patient Instructions (Addendum)
Complete course of antibiotics and prednisone for  COPD exacerbation. Look into starting oxygen.. Let us know your home health agency. Discuss and consider pallitive care  versus hospice.

## 2017-12-11 NOTE — Telephone Encounter (Signed)
Spoke with pt's daughter, Wilburn Mylar, and she requests that the pt's azithromycin be changed because "it is hard on her stomach"; she states that when the pt has taken this medication in the past it is "hard for her to keep anything down, and she won't eat"; will route request to [place]; pt seen in office 12/11/17 by Dr Diona Browner; the pt uses [facility] [address] [place], [place]  also please call the pt's daughter at [phone] to make her aware of medication changes; will route request to [facility] [place]; spoke with Mearl Latin.

## 2017-12-11 NOTE — Progress Notes (Signed)
   Subjective:    Patient ID: Elizabeth Silva, female    DOB: [birth year], 84 y.o.   MRN: [id number]  Cough  This is a new problem. The current episode started yesterday. The problem has been gradually worsening. The cough is productive of purulent sputum ( Change in color of mucus to green.). Associated symptoms include ear congestion, ear pain, nasal congestion, shortness of breath and wheezing. Pertinent negatives include no fever, myalgias or sore throat. Associated symptoms comments: Right ear painful. The symptoms are aggravated by lying down. Treatments tried: mucinex DM. The treatment provided no relief. Her past medical history is significant for COPD and environmental allergies. There is no history of asthma.   No swelling in ankles  No exposure to flu known   Using advair and spiriva.. Using albuterol once a day in last few days.   Blood pressure (!) 152/60, pulse 79, temperature 98.6 F (37 C), temperature source Oral, height 5' (1.524 m), weight 120 lb (54.4 kg), SpO2 92 %.  Review of Systems  Constitutional: Negative for fever.  HENT: Positive for ear pain. Negative for sore throat.   Respiratory: Positive for cough, shortness of breath and wheezing.   Musculoskeletal: Negative for myalgias.  Allergic/Immunologic: Positive for environmental allergies.       Objective:   Physical Exam  Constitutional: Vital signs are normal. She appears well-developed and well-nourished. She is cooperative.  Non-toxic appearance. She does not appear ill. No distress.  HENT:  Head: Normocephalic.  Right Ear: Hearing, tympanic membrane, external ear and ear canal normal. Tympanic membrane is not erythematous, not retracted and not bulging.  Left Ear: Hearing, tympanic membrane, external ear and ear canal normal. Tympanic membrane is not erythematous, not retracted and not bulging.  Nose: Mucosal edema and rhinorrhea present. Right sinus exhibits no maxillary sinus tenderness and no frontal  sinus tenderness. Left sinus exhibits no maxillary sinus tenderness and no frontal sinus tenderness.  Mouth/Throat: Uvula is midline, oropharynx is clear and moist and mucous membranes are normal.  Eyes: Conjunctivae, EOM and lids are normal. Pupils are equal, round, and reactive to light. Lids are everted and swept, no foreign bodies found.  Neck: Trachea normal and normal range of motion. Neck supple. Carotid bruit is not present. No thyroid mass and no thyromegaly present.  Cardiovascular: Normal rate, regular rhythm, S1 normal, S2 normal, normal heart sounds, intact distal pulses and normal pulses. Exam reveals no gallop and no friction rub.  No murmur heard. Pulmonary/Chest: Effort normal. No tachypnea. No respiratory distress. She has decreased breath sounds. She has wheezes. She has rhonchi. She has no rales.  Diffuse rhonchi and wheez  Neurological: She is alert.  Skin: Skin is warm, dry and intact. No rash noted.  Psychiatric: Her speech is normal and behavior is normal. Judgment normal. Her mood appears not anxious. Cognition and memory are normal. She does not exhibit a depressed mood.          Assessment & Plan:

## 2017-12-11 NOTE — Assessment & Plan Note (Signed)
Worrisome for adenocarcinoma.. Pt and family do not wish to look into further.

## 2017-12-11 NOTE — Telephone Encounter (Signed)
Copied from [place]. Topic: Quick Communication - See Telephone Encounter >> Dec 11, 2017  3:02 PM Hewitt Shorts wrote: CRM for notification. See Telephone encounter for: pt is requesting that the azithromycin  be changed to something different it is hard on her stomach   Best number [phone]  [facility] [address]  [place]   12/11/17.

## 2017-12-12 DIAGNOSIS — C3490 Malignant neoplasm of unspecified part of unspecified bronchus or lung: Secondary | ICD-10-CM | POA: Insufficient documentation

## 2017-12-12 MED ORDER — DOXYCYCLINE HYCLATE 100 MG PO TABS: 100.0000 mg | ORAL_TABLET | Freq: Two times a day (BID) | ORAL | 0 refills | Status: DC

## 2017-12-12 NOTE — Telephone Encounter (Signed)
Collie Siad notified as instructed by telephone.

## 2017-12-12 NOTE — Telephone Encounter (Signed)
I put in order for DME oxygen.  We will also likely need to send her most recent oxygen levels.

## 2017-12-12 NOTE — Telephone Encounter (Signed)
Sent in new rx for doxy

## 2017-12-13 NOTE — Telephone Encounter (Signed)
Oxygen orders faxed to [facility] [phone].

## 2017-12-13 NOTE — Telephone Encounter (Signed)
Elizabeth Silva with [facility] calling, insurance will not cover with exacerbation. Will cover with COPD, not COPD exacerbation. Call back # [phone] EXT [phone]

## 2017-12-23 ENCOUNTER — Telehealth: Payer: Self-pay | Admitting: *Deleted

## 2017-12-23 NOTE — Telephone Encounter (Signed)
Left Collie Siad a message letting her know that [facility] could not accept her mom's O2 testing results because her mom was sick at that visit with COPD exacerbation.   I ask that she call our office to make Elizabeth Silva a follow up appointment once antibiotics and prednisone are complete so we can redo her 02 testing.

## 2017-12-24 ENCOUNTER — Encounter: Payer: Self-pay | Admitting: Family Medicine

## 2017-12-31 ENCOUNTER — Ambulatory Visit (INDEPENDENT_AMBULATORY_CARE_PROVIDER_SITE_OTHER): Payer: Medicare Other | Admitting: Family Medicine

## 2017-12-31 ENCOUNTER — Other Ambulatory Visit: Payer: Self-pay

## 2017-12-31 ENCOUNTER — Telehealth: Payer: Self-pay | Admitting: Family Medicine

## 2017-12-31 ENCOUNTER — Encounter: Payer: Self-pay | Admitting: Family Medicine

## 2017-12-31 DIAGNOSIS — J449 Chronic obstructive pulmonary disease, unspecified: Secondary | ICD-10-CM | POA: Diagnosis not present

## 2017-12-31 DIAGNOSIS — C3490 Malignant neoplasm of unspecified part of unspecified bronchus or lung: Secondary | ICD-10-CM

## 2017-12-31 NOTE — Assessment & Plan Note (Addendum)
Not proven by biopsy given pt age/request but likely given increased size of pulmonary nodules. Pt and daughter considering palliative care consult.

## 2017-12-31 NOTE — Telephone Encounter (Signed)
Copied from [place]. Topic: Quick Communication - See Telephone Encounter >> Dec 31, 2017  2:56 PM Boyd Kerbs wrote: CRM for notification. See Telephone encounter for:   Corene Cornea [facility] [phone] ext [phone],  Please call regarding oxygen needed for patient.  12/31/17.

## 2017-12-31 NOTE — Telephone Encounter (Signed)
Left message for Elizabeth Silva to call me back.  Back door phone number given.

## 2017-12-31 NOTE — Assessment & Plan Note (Signed)
No current exacerbation.  Pt with oxygen requirement.

## 2017-12-31 NOTE — Telephone Encounter (Signed)
Spoke with [place] from [facility].   He states he was able to get Ms. Sappenfield's oxygen approved for 90 days.  We will then need to bring patient back into the office for another valid walk test.  With Ms. Finau's COPD history her ambulatory Sp02 on room air needs to be 88% but that would qualify her for lifetime approval.  Collie Siad (daughter)  notified by telephone.

## 2017-12-31 NOTE — Progress Notes (Signed)
   Subjective:    Patient ID: Elizabeth Silva, female    DOB: Aug 27, 1918, 84 y.o.   MRN: [id number]  HPI   84 year old female with COPD, emphysema and pulmonary nodules changing.. Likely lung adenocarcinoma presents for re-eval for oxygen.   She has become more increasingly short of breath over the years.  Her recent COPD exacerbation is improved.. No  purulent productive sputum only occ clear mucus.  No chest pain.  Today in office.. with ambulation O2 on review dropped below 90%and increased with oxygen at 2 liters.   Blood pressure (!) 142/68, pulse 77, temperature 98.6 F (37 C), temperature source Oral, height 5' (1.524 m), weight 119 lb 12 oz (54.3 kg), SpO2 97 %.    Review of Systems  Constitutional: Positive for fatigue. Negative for fever.  HENT: Negative for ear pain.   Eyes: Negative for pain.  Respiratory: Positive for shortness of breath. Negative for chest tightness.   Cardiovascular: Negative for chest pain, palpitations and leg swelling.  Gastrointestinal: Negative for abdominal pain.  Genitourinary: Negative for dysuria.       Objective:   Physical Exam  Constitutional: Vital signs are normal. She appears well-developed and well-nourished. She is cooperative.  Non-toxic appearance. She does not appear ill. No distress.  HENT:  Head: Normocephalic.  Right Ear: Hearing, tympanic membrane, external ear and ear canal normal. Tympanic membrane is not erythematous, not retracted and not bulging.  Left Ear: Hearing, tympanic membrane, external ear and ear canal normal. Tympanic membrane is not erythematous, not retracted and not bulging.  Nose: Mucosal edema and rhinorrhea present. Right sinus exhibits no maxillary sinus tenderness and no frontal sinus tenderness. Left sinus exhibits no maxillary sinus tenderness and no frontal sinus tenderness.  Mouth/Throat: Uvula is midline, oropharynx is clear and moist and mucous membranes are normal.  Eyes: Conjunctivae, EOM and  lids are normal. Pupils are equal, round, and reactive to light. Lids are everted and swept, no foreign bodies found.  Neck: Trachea normal and normal range of motion. Neck supple. Carotid bruit is not present. No thyroid mass and no thyromegaly present.  Cardiovascular: Normal rate, regular rhythm, S1 normal, S2 normal, normal heart sounds, intact distal pulses and normal pulses. Exam reveals no gallop and no friction rub.  No murmur heard. Pulmonary/Chest: Effort normal. No tachypnea. No respiratory distress. She has decreased breath sounds. She has rhonchi. She has no rales.  Diffuse rhonchi  Neurological: She is alert.  Skin: Skin is warm, dry and intact. No rash noted.  Psychiatric: Her speech is normal and behavior is normal. Judgment normal. Her mood appears not anxious. Cognition and memory are normal. She does not exhibit a depressed mood.          Assessment & Plan:

## 2018-01-01 ENCOUNTER — Other Ambulatory Visit: Payer: Self-pay | Admitting: Family Medicine

## 2018-01-01 DIAGNOSIS — R1310 Dysphagia, unspecified: Secondary | ICD-10-CM | POA: Diagnosis not present

## 2018-01-01 DIAGNOSIS — I5042 Chronic combined systolic (congestive) and diastolic (congestive) heart failure: Secondary | ICD-10-CM | POA: Diagnosis not present

## 2018-01-07 ENCOUNTER — Encounter: Payer: Self-pay | Admitting: Family Medicine

## 2018-01-13 ENCOUNTER — Telehealth: Payer: Self-pay

## 2018-01-13 ENCOUNTER — Encounter: Payer: Self-pay | Admitting: Family Medicine

## 2018-01-13 NOTE — Telephone Encounter (Signed)
Faxed copy of 12/31/17 office notes to atten; Hilda Blades with [facility] fax # [phone].

## 2018-01-13 NOTE — Telephone Encounter (Signed)
Copied from [place]. Topic: Quick Communication - See Telephone Encounter >> Jan 13, 2018 11:14 AM Arletha Grippe wrote: CRM for notification. See Telephone encounter for: 01/13/18.Debra from hospice called - she  Needs to have the last office note faxed to [phone] Cb is [phone]

## 2018-01-14 ENCOUNTER — Telehealth: Payer: Self-pay | Admitting: Cardiovascular Disease

## 2018-01-14 NOTE — Telephone Encounter (Signed)
Daughter Collie Siad calling for patient   Pt c/o BP issue: STAT if pt c/o blurred vision, one-sided weakness or slurred speech  1. What are your last 5 BP readings? Late afternoon readings Saturday 168/88 Sunday 148/66 Monday 175/72  2. Are you having any other symptoms (ex. Dizziness, headache, blurred vision, passed out)? Lightheadedness   3. What is your BP issue? Would like to know if the medication dosage should be increased  Patient also has a new pharmacy : [facility]

## 2018-01-14 NOTE — Telephone Encounter (Signed)
I called and spoke with the patient's daughter Elizabeth Silva (ok per [facility]). She states that she periodically takes the patient's BP readings. Over the weekend, she noticed that afternoon BP readings were somewhat elevated.  Reported readings below were taken about 4:30 pm.   I did confirm with Elizabeth Silva that the patient is still taking hydralazine 25 mg TID- she states the patient takes this TID after each meal. She is also taking metoprolol succinate 25 mg once daily- in the AM.  The patient has been experiencing intermittent lightheadedness throughout the day recently. Per Elizabeth Silva, her other sister who is a nurse feels the patient may possibly be dehydrated. Also confirmed with Elizabeth Silva that the patient is taking lasix PRN only- Elizabeth Silva did confirm this. Elizabeth Silva also reports a nurse was with the patient this morning and took her BP- she was 135/60 right after breakfast.  I advised Elizabeth Silva I would forward a message to Dr. Rockey Situ to review and we will call back with any further recommendations.  She is agreeable and voices understanding.

## 2018-01-15 ENCOUNTER — Emergency Department
Admission: EM | Admit: 2018-01-15 | Discharge: 2018-01-15 | Disposition: A | Discharge status: Home / Self Care | Payer: Medicare Other | Attending: Student in an Organized Health Care Education/Training Program | Admitting: Student in an Organized Health Care Education/Training Program

## 2018-01-15 ENCOUNTER — Other Ambulatory Visit: Payer: Self-pay

## 2018-01-15 ENCOUNTER — Emergency Department: Payer: Medicare Other

## 2018-01-15 DIAGNOSIS — M6281 Muscle weakness (generalized): Secondary | ICD-10-CM | POA: Diagnosis not present

## 2018-01-15 DIAGNOSIS — J449 Chronic obstructive pulmonary disease, unspecified: Secondary | ICD-10-CM | POA: Insufficient documentation

## 2018-01-15 DIAGNOSIS — Z7982 Long term (current) use of aspirin: Secondary | ICD-10-CM | POA: Diagnosis not present

## 2018-01-15 DIAGNOSIS — I10 Essential (primary) hypertension: Secondary | ICD-10-CM | POA: Diagnosis not present

## 2018-01-15 DIAGNOSIS — I5032 Chronic diastolic (congestive) heart failure: Secondary | ICD-10-CM | POA: Diagnosis not present

## 2018-01-15 DIAGNOSIS — Z87891 Personal history of nicotine dependence: Secondary | ICD-10-CM | POA: Insufficient documentation

## 2018-01-15 DIAGNOSIS — Z79899 Other long term (current) drug therapy: Secondary | ICD-10-CM | POA: Insufficient documentation

## 2018-01-15 DIAGNOSIS — N184 Chronic kidney disease, stage 4 (severe): Secondary | ICD-10-CM | POA: Diagnosis not present

## 2018-01-15 DIAGNOSIS — R682 Dry mouth, unspecified: Secondary | ICD-10-CM | POA: Diagnosis not present

## 2018-01-15 DIAGNOSIS — I130 Hypertensive heart and chronic kidney disease with heart failure and stage 1 through stage 4 chronic kidney disease, or unspecified chronic kidney disease: Secondary | ICD-10-CM | POA: Insufficient documentation

## 2018-01-15 LAB — CBC WITH DIFFERENTIAL/PLATELET
BASOS PCT: 1 %
Basophils Absolute: 0.1 10*3/uL (ref 0–0.1)
EOS ABS: 0.6 10*3/uL (ref 0–0.7)
Eosinophils Relative: 7 %
HEMATOCRIT: 39 % (ref 35.0–47.0)
Hemoglobin: 12.5 g/dL (ref 12.0–16.0)
LYMPHS ABS: 1.4 10*3/uL (ref 1.0–3.6)
Lymphocytes Relative: 16 %
MCH: 29.4 pg (ref 26.0–34.0)
MCHC: 32.1 g/dL (ref 32.0–36.0)
MCV: 91.7 fL (ref 80.0–100.0)
MONO ABS: 0.9 10*3/uL (ref 0.2–0.9)
MONOS PCT: 10 %
Neutro Abs: 6.2 10*3/uL (ref 1.4–6.5)
Neutrophils Relative %: 66 %
Platelets: 278 10*3/uL (ref 150–440)
RBC: 4.25 MIL/uL (ref 3.80–5.20)
RDW: 14.7 % — AB (ref 11.5–14.5)
WBC: 9.3 10*3/uL (ref 3.6–11.0)

## 2018-01-15 LAB — URINALYSIS, COMPLETE (UACMP) WITH MICROSCOPIC
BILIRUBIN URINE: NEGATIVE
Glucose, UA: NEGATIVE mg/dL
Hgb urine dipstick: NEGATIVE
Ketones, ur: NEGATIVE mg/dL
Leukocytes, UA: NEGATIVE
NITRITE: NEGATIVE
PH: 6 (ref 5.0–8.0)
Protein, ur: 30 mg/dL — AB
SPECIFIC GRAVITY, URINE: 1.004 — AB (ref 1.005–1.030)
Squamous Epithelial / LPF: NONE SEEN

## 2018-01-15 LAB — BASIC METABOLIC PANEL
Anion gap: 10 (ref 5–15)
BUN: 36 mg/dL — AB (ref 6–20)
CALCIUM: 9.2 mg/dL (ref 8.9–10.3)
CHLORIDE: 103 mmol/L (ref 101–111)
CO2: 26 mmol/L (ref 22–32)
CREATININE: 1.65 mg/dL — AB (ref 0.44–1.00)
GFR calc non Af Amer: 25 mL/min — ABNORMAL LOW (ref >60)
GFR, EST AFRICAN AMERICAN: 29 mL/min — AB (ref >60)
Glucose, Bld: 104 mg/dL — ABNORMAL HIGH (ref 65–99)
Potassium: 4.5 mmol/L (ref 3.5–5.1)
SODIUM: 139 mmol/L (ref 135–145)

## 2018-01-15 MED ORDER — HYDRALAZINE HCL 50 MG PO TABS
25.0000 mg | ORAL_TABLET | Freq: Once | ORAL | Status: AC
Start: 2018-01-15 — End: 2018-01-15
  Administered 2018-01-15: 25 mg via ORAL
  Filled 2018-01-15: qty 1

## 2018-01-15 MED ORDER — FUROSEMIDE 40 MG PO TABS
20.0000 mg | ORAL_TABLET | Freq: Once | ORAL | Status: AC
  Administered 2018-01-15: 20 mg via ORAL
  Filled 2018-01-15: qty 1

## 2018-01-15 MED ORDER — HYDRALAZINE HCL 50 MG PO TABS
25.0000 mg | ORAL_TABLET | Freq: Once | ORAL | Status: AC
  Administered 2018-01-15: 25 mg via ORAL
  Filled 2018-01-15: qty 1

## 2018-01-15 MED ORDER — CLONIDINE HCL 0.1 MG PO TABS
0.1000 mg | ORAL_TABLET | Freq: Once | ORAL | Status: DC
  Filled 2018-01-15: qty 1

## 2018-01-15 MED ORDER — HYDRALAZINE HCL 25 MG PO TABS: 50.0000 mg | ORAL_TABLET | Freq: Three times a day (TID) | ORAL | 3 refills | Status: DC

## 2018-01-15 NOTE — ED Triage Notes (Signed)
Pt comes via [facility] from [facility] with c/o hypertension and weakness. Pt states she took her BP and it was reading over 200/100. EMS states pt has stated she has been feeling weak, high BP and dry mouth. Per EMS BP manually 140/72, 98.4 temp, 88, 98% room air, BS-123. Pt is alert and oriented. Pt also states she hasn't been taking her lasix for over month. Pt does have some pitting edema noted to her legs.

## 2018-01-15 NOTE — ED Notes (Signed)
PT assisted to the use of a bedpan. Pt urinated 300cc.

## 2018-01-15 NOTE — ED Notes (Signed)
Pt assisted with bed pan

## 2018-01-15 NOTE — ED Provider Notes (Signed)
[facility] Emergency Department Provider Note    First MD Initiated Contact with Patient 01/15/18 1312     (approximate)  I have reviewed the triage vital signs and the nursing notes.   HISTORY  Chief Complaint Weakness    HPI Elizabeth Silva is a 84 y.o. female presents with a chief complaint of elevated blood pressure.  Initial report was that she is complaining of weakness of the patient denies any complaint of weakness and was just worried about her blood pressure being elevated and is noted that it been elevated for the past several days.  Had try to get into cardiology clinic but when is able to.  No recent missed medication doses.  Denies any chest pain.  No headache.  No shortness of breath.  No numbness or tingling. Past Medical History:  Diagnosis Date  . Allergic rhinitis   . CKD (chronic kidney disease)   . COPD (chronic obstructive pulmonary disease) ([place])   . Diastolic CHF, acute (HCC)    echo (4/11) with EF 60-65%,mild LVH, mild aortic insufficiency, mild MR, PA systolic pressure 56-21 mmHg, mild to moderate TR  . Diverticulosis of colon   . GERD (gastroesophageal reflux disease)   . GI bleed   . H/O Clostridium difficile infection Sept. 2013  . History of PFTs   . Hyperlipidemia   . Hypertension    LE swelling with amlodipine, intolerant of ACEIs  . Impaired vision    right eye, was told due to HTN  . LBBB (left bundle branch block)   . Osteoarthritis    Family History  Problem Relation Age of Onset  . Heart block Mother        complete  . Dementia Father   . Colon cancer Sister   . Dementia Sister   . Lung cancer Brother   . Cancer Brother        sinus  . Diabetes Brother    Past Surgical History:  Procedure Laterality Date  . CARDIAC CATHETERIZATION  1999   normal  . CERVICAL [place] SURGERY  2005   cerv. disc, dz. C5-C7, facet arthritis  . CHOLECYSTECTOMY  1980  . TOTAL ABDOMINAL HYSTERECTOMY  1970   suspicious cells     Patient Active Problem List   Diagnosis Date Noted  . Lung cancer ([place]) 12/12/2017  . Senile purpura ([place]) 04/23/2017  . Trigger finger 03/04/2017  . Gout 11/23/2016  . Chronic systolic (congestive) heart failure ([place]) 11/02/2016  . Goals of care, counseling/discussion   . Palliative care encounter   . Upper GI bleed 04/26/2016  . Chronic constipation 04/12/2016  . Abnormal chest x-ray 01/12/2016  . Right knee pain 01/10/2016  . Advance directive discussed with patient 01/10/2016  . Abnormal swallowing 10/27/2015  . Dyspnea 10/27/2015  . Vision loss, bilateral 10/26/2014  . Carotid stenosis 10/26/2014  . Bilateral tinnitus 04/20/2014  . Prediabetes 01/07/2014  . Bilateral knee pain 01/07/2014  . Hip pain, left 01/07/2014  . Insomnia 01/02/2013  . Anemia of chronic renal failure 12/25/2010  . CONSTIPATION 12/08/2010  . DIASTOLIC HEART FAILURE, CHRONIC 03/02/2010  . INCONTINENCE, URGE 01/25/2010  . PERIPHERAL NEUROPATHY, IDIOPATHIC 05/10/2008  . PERIPHERAL VASCULAR DISEASE 05/05/2007  . Chronic venous insufficiency 05/05/2007  . ALLERGIC RHINITIS 05/05/2007  . COPD, moderate ([place]) 05/05/2007  . GERD 05/05/2007  . DIVERTICULOSIS, COLON 05/05/2007  . Osteoarthritis, multiple sites 05/05/2007  . DEGENERATION, CERVICAL DISC 05/05/2007  . STRESS INCONTINENCE 05/05/2007  . HYPERCHOLESTEROLEMIA, PURE 04/07/2007  .  RENAL STONE 04/07/2007  . Essential hypertension, benign 03/17/2007  . Chronic kidney disease, stage 4, severely decreased GFR ([place]) 03/17/2007  . CARDIAC MURMUR 03/17/2007      Prior to Admission medications   Medication Sig Start Date End Date Taking? Authorizing Provider  aspirin 81 MG EC tablet Take 81 mg by mouth daily.     Yes [provider]  diclofenac sodium (VOLTAREN) 1 % GEL Apply 4 g topically 4 (four) times daily. 08/06/17  Yes Bedsole, Amy E, MD  diphenhydramine-acetaminophen (TYLENOL PM) 25-500 MG TABS tablet Take 1 tablet by mouth at  bedtime as needed.   Yes [provider]  ferrous fumarate (HEMOCYTE - 106 MG FE) 325 (106 FE) MG TABS tablet Take 1 tablet (106 mg of iron total) by mouth daily. 10/27/13  Yes Bedsole, Amy E, MD  fluticasone (FLONASE) 50 MCG/ACT nasal spray USE 2 SPRAYS IN EACH  NOSTRIL DAILY 01/01/18  Yes Bedsole, Amy E, MD  Fluticasone-Salmeterol (ADVAIR DISKUS) 250-50 MCG/DOSE AEPB INHALE 1 PUFF INTO THE  LUNGS TWO TIMES DAILY 11/08/17  Yes Bedsole, Amy E, MD  gabapentin (NEURONTIN) 600 MG tablet TAKE 1 TABLET BY MOUTH AT  BEDTIME 08/06/17  Yes Bedsole, Amy E, MD  ipratropium (ATROVENT) 0.03 % nasal spray Place 2 sprays into both nostrils every 12 (twelve) hours. 03/06/17  Yes Copland, Frederico Hamman, MD  Lactobacillus Rhamnosus, GG, (CULTURELLE PO) Take 1 tablet by mouth 2 (two) times daily.   Yes [provider]  lovastatin (MEVACOR) 20 MG tablet TAKE 1 TABLET BY MOUTH AT  BEDTIME 05/20/17  Yes Gollan, Kathlene November, MD  metoprolol succinate (TOPROL-XL) 25 MG 24 hr tablet Take 1 tablet (25 mg total) by mouth daily. Take with or immediately following a meal. 03/07/17  Yes Gollan, Kathlene November, MD  omeprazole (PRILOSEC) 40 MG capsule TAKE 1 CAPSULE BY MOUTH  DAILY 08/21/17  Yes Bedsole, Amy E, MD  PROAIR HFA 108 (90 Base) MCG/ACT inhaler INHALE 2 PUFFS EVERY 6  HOURS AS NEEDED FOR  WHEEZING OR SHORTNESS OF  BREATH. 10/17/16  Yes Bedsole, Amy E, MD  SPIRIVA HANDIHALER 18 MCG inhalation capsule INHALE THE CONTENTS OF 1  CAPSULE VIA HANDIHALER  DAILY 08/09/16  Yes Bedsole, Amy E, MD  traMADol (ULTRAM) 50 MG tablet Take 1 tablet (50 mg total) by mouth every 12 (twelve) hours as needed for severe pain. 11/11/17  Yes Bedsole, Amy E, MD  hydrALAZINE (APRESOLINE) 25 MG tablet Take 2 tablets (50 mg total) by mouth 3 (three) times daily. 01/15/18   Merlyn Lot, MD    Allergies Levofloxacin and Penicillins    Social History Social History   Tobacco Use  . Smoking status: Former Smoker    Packs/day: 1.00    Types:  Cigarettes    Last attempt to quit: 10/15/1984    Years since quitting: 33.2  . Smokeless tobacco: Never Used  Substance Use Topics  . Alcohol use: Yes    Alcohol/week: 0.0 oz  . Drug use: No    Review of Systems Patient denies headaches, rhinorrhea, blurry vision, numbness, shortness of breath, chest pain, edema, cough, abdominal pain, nausea, vomiting, diarrhea, dysuria, fevers, rashes or hallucinations unless otherwise stated above in HPI. ____________________________________________   PHYSICAL EXAM:  VITAL SIGNS: Vitals:   01/15/18 1600 01/15/18 1630  BP: (!) 178/65 (!) 189/72  Pulse: 81 88  Resp: 17 (!) 22  Temp:    SpO2: 92% 94%    Constitutional: Alert and oriented. Well appearing and in  no acute distress. Eyes: Conjunctivae are normal.  Head: Atraumatic. Nose: No congestion/rhinnorhea. Mouth/Throat: Mucous membranes are moist.   Neck: No stridor. Painless ROM.  Cardiovascular: Normal rate, regular rhythm. Grossly normal heart sounds.  Good peripheral circulation. Respiratory: Normal respiratory effort.  No retractions. Lungs CTAB. Gastrointestinal: Soft and nontender. No distention. No abdominal bruits. No CVA tenderness. Genitourinary:  Musculoskeletal: No lower extremity tenderness nor edema.  No joint effusions. Neurologic:  Normal speech and language. No gross focal neurologic deficits are appreciated. No facial droop Skin:  Skin is warm, dry and intact. No rash noted. Psychiatric: Mood and affect are normal. Speech and behavior are normal.  ____________________________________________   LABS (all labs ordered are listed, but only abnormal results are displayed)  Results for orders placed or performed during the hospital encounter of 01/15/18 (from the past 24 hour(s))  CBC with Differential/Platelet     Status: Abnormal   Collection Time: 01/15/18  1:15 PM  Result Value Ref Range   WBC 9.3 3.6 - 11.0 K/uL   RBC 4.25 3.80 - 5.20 MIL/uL   Hemoglobin 12.5  12.0 - 16.0 g/dL   HCT 39.0 35.0 - 47.0 %   MCV 91.7 80.0 - 100.0 fL   MCH 29.4 26.0 - 34.0 pg   MCHC 32.1 32.0 - 36.0 g/dL   RDW 14.7 (H) 11.5 - 14.5 %   Platelets 278 150 - 440 K/uL   Neutrophils Relative % 66 %   Neutro Abs 6.2 1.4 - 6.5 K/uL   Lymphocytes Relative 16 %   Lymphs Abs 1.4 1.0 - 3.6 K/uL   Monocytes Relative 10 %   Monocytes Absolute 0.9 0.2 - 0.9 K/uL   Eosinophils Relative 7 %   Eosinophils Absolute 0.6 0 - 0.7 K/uL   Basophils Relative 1 %   Basophils Absolute 0.1 0 - 0.1 K/uL  Basic metabolic panel     Status: Abnormal   Collection Time: 01/15/18  1:15 PM  Result Value Ref Range   Sodium 139 135 - 145 mmol/L   Potassium 4.5 3.5 - 5.1 mmol/L   Chloride 103 101 - 111 mmol/L   CO2 26 22 - 32 mmol/L   Glucose, Bld 104 (H) 65 - 99 mg/dL   BUN 36 (H) 6 - 20 mg/dL   Creatinine, Ser 1.65 (H) 0.44 - 1.00 mg/dL   Calcium 9.2 8.9 - 10.3 mg/dL   GFR calc non Af Amer 25 (L) >60 mL/min   GFR calc Af Amer 29 (L) >60 mL/min   Anion gap 10 5 - 15  Urinalysis, Complete w Microscopic     Status: Abnormal   Collection Time: 01/15/18  1:58 PM  Result Value Ref Range   Color, Urine STRAW (A) YELLOW   APPearance CLEAR (A) CLEAR   Specific Gravity, Urine 1.004 (L) 1.005 - 1.030   pH 6.0 5.0 - 8.0   Glucose, UA NEGATIVE NEGATIVE mg/dL   Hgb urine dipstick NEGATIVE NEGATIVE   Bilirubin Urine NEGATIVE NEGATIVE   Ketones, ur NEGATIVE NEGATIVE mg/dL   Protein, ur 30 (A) NEGATIVE mg/dL   Nitrite NEGATIVE NEGATIVE   Leukocytes, UA NEGATIVE NEGATIVE   RBC / HPF 0-5 0 - 5 RBC/hpf   WBC, UA 0-5 0 - 5 WBC/hpf   Bacteria, UA RARE (A) NONE SEEN   Squamous Epithelial / LPF NONE SEEN NONE SEEN   ____________________________________________  EKG My review and personal interpretation at Time: 13:13   Indication: htn  Rate: 80  Rhythm: sinus Axis:  normal Other: normal intervals, no stemi,lbbb, ____________________________________________  RADIOLOGY  I personally reviewed all  radiographic images ordered to evaluate for the above acute complaints and reviewed radiology reports and findings.  These findings were personally discussed with the patient.  Please see medical record for radiology report.  ____________________________________________   PROCEDURES  Procedure(s) performed:  Procedures    Critical Care performed: no ____________________________________________   INITIAL IMPRESSION / ASSESSMENT AND PLAN / ED COURSE  Pertinent labs & imaging results that were available during my care of the patient were reviewed by me and considered in my medical decision making (see chart for details).  DDX: htnive emergency, urgency, essential htn, chf  Elizabeth Silva is a 84 y.o. who presents to the ED with concern for elevated BP. Patient is AF,VSS with HTN in ED. Exam as above. Given current presentation have considered the above differential. No report of missed antihypertensive doses or medical non-compliance. Extensive evaluation of possible end organ damage pursued in ED. no evidence of acute renal dysfunction. Neuro exam without focal deficits. EKG without evidence of ischemia. Renal function at baseline. Not consistent with CHF, malignant htn, adrenergic crisis or hypertensive emergency.  Clinical Course as of Jan 15 2025  Wed Jan 15, 2018  1723 Patient blood pressure did improve with increasing her hydralazine to 50.  I spoke with Dr. end of cardiology who agrees with increasing her hydralazine to 50 3 times daily.  Will have patient follow-up with Dr. Rockey Situ.   [PR]    Clinical Course User Index [PR] Merlyn Lot, MD     As part of my medical decision making, I reviewed the following data within the [place] notes reviewed and incorporated, Labs reviewed, notes from prior ED visits and [place] Controlled Substance Database   ____________________________________________   FINAL CLINICAL IMPRESSION(S) / ED DIAGNOSES  Final  diagnoses:  Hypertension, unspecified type      NEW MEDICATIONS STARTED DURING THIS VISIT:  Discharge Medication List as of 01/15/2018  5:01 PM       Note:  This document was prepared using Dragon voice recognition software and may include unintentional dictation errors.    Merlyn Lot, MD 01/15/18 2026

## 2018-01-15 NOTE — ED Notes (Signed)
Pt given food tray and gingerale

## 2018-01-17 DIAGNOSIS — J449 Chronic obstructive pulmonary disease, unspecified: Secondary | ICD-10-CM | POA: Diagnosis not present

## 2018-01-17 NOTE — Telephone Encounter (Signed)
S/w patient's daughter, ok per DPR. She verbalized understanding of Dr Donivan Scull recommendations. She stated patient was in the ED on 01/15/18 for elevated BP. ER physician increased hydralazine to 50 mg by mouth three times a day. Patient has been doing this and BP are appropriate. Patient has appointment with Ignacia Bayley, NP on Monday, 01/20/18.

## 2018-01-17 NOTE — Telephone Encounter (Signed)
For elevated blood pressures in the afternoon we could try a small change first Consider increasing hydralazine up to 50 mg at lunch Would monitor blood pressures late afternoon and possibly even into the evening if possible Might need two at dinner as well if blood pressure runs high at night

## 2018-01-20 ENCOUNTER — Encounter: Payer: Self-pay | Admitting: Nurse Practitioner

## 2018-01-20 ENCOUNTER — Ambulatory Visit: Payer: Medicare Other | Admitting: Nurse Practitioner

## 2018-01-20 VITALS — BP 140/54 | HR 81 | Ht 61.0 in | Wt 121.0 lb

## 2018-01-20 DIAGNOSIS — I5032 Chronic diastolic (congestive) heart failure: Secondary | ICD-10-CM | POA: Diagnosis not present

## 2018-01-20 DIAGNOSIS — I10 Essential (primary) hypertension: Secondary | ICD-10-CM

## 2018-01-20 MED ORDER — HYDRALAZINE HCL 50 MG PO TABS: 50.0000 mg | ORAL_TABLET | Freq: Three times a day (TID) | ORAL | 3 refills | Status: AC

## 2018-01-20 NOTE — Progress Notes (Signed)
Office Visit    Patient Name: Elizabeth Silva Date of Encounter: 01/20/2018  Primary Care Provider:  Jinny Sanders, MD Primary Cardiologist:  Ida Rogue, MD  Chief Complaint    84 year old female with a history of hypertension, hyperlipidemia, left bundle branch block, COPD, HFpEF, tricuspid regurgitation, and legal blindness, who presents for follow-up after recent ER evaluation for elevated blood pressure.  Past Medical History    Past Medical History:  Diagnosis Date  . Allergic rhinitis   . CKD (chronic kidney disease)   . COPD (chronic obstructive pulmonary disease) ([place])   . Diastolic CHF, acute (H[facility])    echo (4/11) with EF 60-65%,mild LVH, mild aortic insufficiency, mild MR, PA systolic pressure 82-95 mmHg, mild to moderate TR  . Diverticulosis of colon   . GERD (gastroesophageal reflux disease)   . GI bleed   . H/O Clostridium difficile infection Sept. 2013  . History of PFTs   . Hyperlipidemia   . Hypertension    LE swelling with amlodipine, intolerant of ACEIs  . Impaired vision    right eye, was told due to HTN  . LBBB (left bundle branch block)   . Osteoarthritis    Past Surgical History:  Procedure Laterality Date  . CARDIAC CATHETERIZATION  1999   normal  . CERVICAL [place] SURGERY  2005   cerv. disc, dz. C5-C7, facet arthritis  . CHOLECYSTECTOMY  1980  . TOTAL ABDOMINAL HYSTERECTOMY  1970   suspicious cells    Allergies  Allergies  Allergen Reactions  . Levofloxacin Other (See Comments)    REACTION: Burning in legs  . Penicillins Rash and Other (See Comments)    Patient unable to answer follow up questions at this time    History of Present Illness    84 year old female with the above past medical history including hypertension, hyperlipidemia, HFpEF, left bundle branch block, COPD, tricuspid regurgitation, and legal blindness.  She was recently evaluated in the emergency department secondary to elevated blood pressures at home, ranging in  the 170s to 180s.  After discussion with our team, recommendation was made for increasing hydralazine to 50 mg 3 times daily.  Since doing this, blood pressures have been more stable at home, typically trending in the 140s to 150s.  Patient has noted that if she gets up quickly, she does have some lightheadedness and therefore will have to stand for a bit to steady herself out.  She denies chest pain, dyspnea, PND, palpitations, orthopnea, syncope, or early satiety.  She does occasionally have mild ankle swelling which is easily managed by keeping her legs elevated.  Home Medications    Prior to Admission medications   Medication Sig Start Date End Date Taking? Authorizing Provider  aspirin 81 MG EC tablet Take 81 mg by mouth daily.     Yes [provider]  diclofenac sodium (VOLTAREN) 1 % GEL Apply 4 g topically 4 (four) times daily. 08/06/17  Yes Bedsole, Amy E, MD  diphenhydramine-acetaminophen (TYLENOL PM) 25-500 MG TABS tablet Take 1 tablet by mouth at bedtime as needed.   Yes [provider]  ferrous fumarate (HEMOCYTE - 106 MG FE) 325 (106 FE) MG TABS tablet Take 1 tablet (106 mg of iron total) by mouth daily. 10/27/13  Yes Bedsole, Amy E, MD  fluticasone (FLONASE) 50 MCG/ACT nasal spray USE 2 SPRAYS IN EACH  NOSTRIL DAILY 01/01/18  Yes Bedsole, Amy E, MD  Fluticasone-Salmeterol (ADVAIR DISKUS) 250-50 MCG/DOSE AEPB INHALE 1 PUFF INTO THE  LUNGS TWO TIMES DAILY 11/08/17  Yes Bedsole, Amy E, MD  gabapentin (NEURONTIN) 600 MG tablet TAKE 1 TABLET BY MOUTH AT  BEDTIME 08/06/17  Yes Bedsole, Amy E, MD  hydrALAZINE (APRESOLINE) 25 MG tablet Take 2 tablets (50 mg total) by mouth 3 (three) times daily. 01/15/18  Yes Merlyn Lot, MD  ipratropium (ATROVENT) 0.03 % nasal spray Place 2 sprays into both nostrils every 12 (twelve) hours. 03/06/17  Yes Copland, Frederico Hamman, MD  Lactobacillus Rhamnosus, GG, (CULTURELLE PO) Take 1 tablet by mouth 2 (two) times daily.   Yes [provider]   lovastatin (MEVACOR) 20 MG tablet TAKE 1 TABLET BY MOUTH AT  BEDTIME 05/20/17  Yes Gollan, Kathlene November, MD  metoprolol succinate (TOPROL-XL) 25 MG 24 hr tablet Take 1 tablet (25 mg total) by mouth daily. Take with or immediately following a meal. 03/07/17  Yes Gollan, Kathlene November, MD  omeprazole (PRILOSEC) 40 MG capsule TAKE 1 CAPSULE BY MOUTH  DAILY 08/21/17  Yes Bedsole, Amy E, MD  PROAIR HFA 108 (90 Base) MCG/ACT inhaler INHALE 2 PUFFS EVERY 6  HOURS AS NEEDED FOR  WHEEZING OR SHORTNESS OF  BREATH. 10/17/16  Yes Bedsole, Amy E, MD  SPIRIVA HANDIHALER 18 MCG inhalation capsule INHALE THE CONTENTS OF 1  CAPSULE VIA HANDIHALER  DAILY 08/09/16  Yes Bedsole, Amy E, MD  traMADol (ULTRAM) 50 MG tablet Take 1 tablet (50 mg total) by mouth every 12 (twelve) hours as needed for severe pain. 11/11/17  Yes Bedsole, Amy E, MD    Review of Systems    As above, some orthostatic lightheadedness and mild ankle edema.  All other systems reviewed and are otherwise negative except as noted above.  Physical Exam    VS:  BP (!) 140/54 (BP Location: Left Arm, Patient Position: Sitting, Cuff Size: Normal)   Pulse 81   Ht 5\' 1"  (1.549 m)   Wt 121 lb (54.9 kg)   BMI 22.86 kg/m  , BMI Body mass index is 22.86 kg/m. GEN: Well nourished, well developed, in no acute distress.  HEENT: normal.  Neck: Supple, no JVD, carotid bruits, or masses. Cardiac: RRR, 2/6 systolic murmur at the left lower sternal border, no rubs, or gallops. No clubbing, cyanosis, trace bilateral, right greater than left ankle edema.  Radials/DP/PT 2+ and equal bilaterally.  Respiratory:  Respirations regular and unlabored, clear to auscultation bilaterally. GI: Soft, nontender, nondistended, BS + x 4. MS: no deformity or atrophy. Skin: warm and dry, no rash. Neuro:  Strength and sensation are intact. Psych: Normal affect.  Accessory Clinical Findings    ECG -regular sinus rhythm, 81, left bundle branch block  Assessment & Plan    1.  Essential  hypertension: Patient recently evaluated in the emergency department with blood pressures in the 170s to 180s.  She was not particularly symptomatic at that time.  Based on our recommendation, she has been taking hydralazine 50 mg 3 times daily along with Toprol-XL 25 mg daily.  In the settings, blood pressures have been more reasonable.  She has had some mild orthostasis and we discussed the importance of getting up slowly and even considering wearing compression hose as she does have a history of mild lower extremity swelling as well.  She does take her blood pressure quite a bit throughout the day.  I recommended that as long as she is asymptomatic, one check a day should be adequate.  Her daughter feels that there is an anxiety component that becomes apparent with  multiple blood pressure checks.  2.  Chronic diastolic congestive heart failure: Volume and weight stable.  Heart rate and blood pressure reasonable.    3.  Disposition: Follow-up with Dr. Rockey Situ in 3 months or sooner if necessary.   Murray Hodgkins, NP 01/20/2018, 3:06 PM

## 2018-01-20 NOTE — Patient Instructions (Signed)
Medication Instructions: - Your physician recommends that you continue on your current medications as directed. Please refer to the Current Medication list given to you today.  * We have refilled your hydralazine for the 50 mg tablets- take 1 tablet by mouth three times a day (sent to [facility])  Labwork: - none ordered  Procedures/Testing: - none ordered  Follow-Up: - Your physician recommends that you schedule a follow-up appointment in: 3 months with Dr. Rockey Situ.    Any Additional Special Instructions Will Be Listed Below (If Applicable).     If you need a refill on your cardiac medications before your next appointment, please call your pharmacy.

## 2018-01-21 ENCOUNTER — Encounter: Payer: Self-pay | Admitting: Family Medicine

## 2018-01-21 DIAGNOSIS — C3490 Malignant neoplasm of unspecified part of unspecified bronchus or lung: Secondary | ICD-10-CM

## 2018-01-28 ENCOUNTER — Telehealth: Payer: Self-pay | Admitting: Family Medicine

## 2018-01-28 NOTE — Assessment & Plan Note (Signed)
Chronic worsening of COPD as well as concern for progression of likely lung cancer.  Treat with antibiotics and prednisone, but pt with new oxygen requirement.  Will order oxygen.  Also discussed palliative care referral.

## 2018-01-28 NOTE — Telephone Encounter (Signed)
Copied from [place] [id number]. Topic: Inquiry >> Jan 28, 2018  1:55 PM Lennox Solders wrote: Reason for CRM: Hospice service will be providing the patient with oxygen equipment and adv home care needs a order to have  oxygen from [facility] living in room 225 to be removed.

## 2018-01-28 NOTE — Telephone Encounter (Signed)
Order in Donna's basket

## 2018-01-29 NOTE — Telephone Encounter (Signed)
Order faxed to [facility] [phone].

## 2018-02-04 ENCOUNTER — Other Ambulatory Visit: Payer: Self-pay | Admitting: Family Medicine

## 2018-02-04 NOTE — Telephone Encounter (Signed)
Last office visit 12/31/2017.  Last refilled 08/06/2017 for #90 with 1 refill.  Ok to refill?

## 2018-02-06 ENCOUNTER — Other Ambulatory Visit: Payer: Self-pay | Admitting: Cardiovascular Disease

## 2018-02-06 ENCOUNTER — Other Ambulatory Visit: Payer: Self-pay | Admitting: Family Medicine

## 2018-02-07 ENCOUNTER — Ambulatory Visit: Payer: Medicare Other | Admitting: Family Medicine

## 2018-02-14 ENCOUNTER — Encounter: Payer: Self-pay | Admitting: Family Medicine

## 2018-02-14 ENCOUNTER — Ambulatory Visit (INDEPENDENT_AMBULATORY_CARE_PROVIDER_SITE_OTHER): Payer: Medicare Other | Admitting: Family Medicine

## 2018-02-14 VITALS — BP 170/60 | HR 83 | Temp 99.0°F | Ht 60.0 in | Wt 119.8 lb

## 2018-02-14 DIAGNOSIS — N184 Chronic kidney disease, stage 4 (severe): Secondary | ICD-10-CM | POA: Diagnosis not present

## 2018-02-14 DIAGNOSIS — J449 Chronic obstructive pulmonary disease, unspecified: Secondary | ICD-10-CM

## 2018-02-14 DIAGNOSIS — Z515 Encounter for palliative care: Secondary | ICD-10-CM

## 2018-02-14 DIAGNOSIS — L989 Disorder of the skin and subcutaneous tissue, unspecified: Secondary | ICD-10-CM

## 2018-02-14 DIAGNOSIS — C3490 Malignant neoplasm of unspecified part of unspecified bronchus or lung: Secondary | ICD-10-CM | POA: Diagnosis not present

## 2018-02-14 DIAGNOSIS — I10 Essential (primary) hypertension: Secondary | ICD-10-CM

## 2018-02-14 NOTE — Progress Notes (Signed)
Subjective:    Patient ID: Elizabeth Silva, female    DOB: 07-27-1918, 84 y.o.   MRN: [id number]  HPI   84 year old female pt on hospice for presumed lung adenocarcinoma given increase size of lung lesion ( no further work up  Desired by pt and family), COPD,  Hypoxia now improved on oxygen, CKD, HTN presents for 3 month follow up.   HTN: recent BP elevations  She was seen in ED on 01/15/2018 for   BP improved with increase of hydralazine in ER.  followed by Dr. Sharolyn Douglas for Dr. Rockey Situ on 01/20/2018   Now on 50 mg hydralazine TID.  Felt there was an anxiety component to BP increases. She checks her BP very frequently.    Today she is doing fairly well. She denies anxiety  She  reports oxygen has helped some with SOB.  B) at home today 127/51. BP is improved.   She is using compression hose daily, lasix prn.  BP Readings from Last 3 Encounters:  02/14/18 (!) 170/60  01/20/18 (!) 140/54  01/15/18 (!) 189/72    She has noted sore place in last few days on  Right posterior tounge,  No mouth coating. In last week has noted decrease sweet taste.   Has skin lesion on right posterior head. Has increased in size, occ itchy. No bleeding except scratching.  Social History /Family History/Past Medical History reviewed in detail and updated in EMR if needed. Blood pressure (!) 170/60, pulse 83, temperature 99 F (37.2 C), temperature source Oral, height 5' (1.524 m), weight 119 lb 12 oz (54.3 kg).  Review of Systems  Constitutional: Positive for appetite change and fatigue. Negative for fever.  HENT: Negative for congestion.   Eyes: Negative for pain.  Respiratory: Positive for shortness of breath. Negative for cough.   Cardiovascular: Negative for chest pain, palpitations and leg swelling.  Gastrointestinal: Negative for abdominal pain.  Genitourinary: Negative for dysuria and vaginal bleeding.  Musculoskeletal: Negative for back pain.  Neurological: Negative for syncope, light-headedness  and headaches.  Psychiatric/Behavioral: Negative for dysphoric mood.       Objective:   Physical Exam  Constitutional: Vital signs are normal. She appears well-developed and well-nourished. She is cooperative.  Non-toxic appearance. She does not appear ill. No distress.  HENT:  Head: Normocephalic.  Right Ear: Hearing, tympanic membrane, external ear and ear canal normal. Tympanic membrane is not erythematous, not retracted and not bulging.  Left Ear: Hearing, tympanic membrane, external ear and ear canal normal. Tympanic membrane is not erythematous, not retracted and not bulging.  Nose: Mucosal edema and rhinorrhea present. Right sinus exhibits no maxillary sinus tenderness and no frontal sinus tenderness. Left sinus exhibits no maxillary sinus tenderness and no frontal sinus tenderness.  Mouth/Throat: Uvula is midline, oropharynx is clear and moist and mucous membranes are normal.  Eyes: Pupils are equal, round, and reactive to light. Conjunctivae, EOM and lids are normal. Lids are everted and swept, no foreign bodies found.  Neck: Trachea normal and normal range of motion. Neck supple. Carotid bruit is not present. No thyroid mass and no thyromegaly present.  Cardiovascular: Normal rate, regular rhythm, S1 normal, S2 normal, normal heart sounds, intact distal pulses and normal pulses. Exam reveals no gallop and no friction rub.  No murmur heard. Pulmonary/Chest: Effort normal. No tachypnea. No respiratory distress. She has decreased breath sounds. She has rhonchi. She has no rales.  Diffuse rhonchi  Neurological: She is alert.  Skin: Skin is  warm, dry and intact. No rash noted.  0.5 cm dry erythematous macule on scalp  Psychiatric: Her speech is normal and behavior is normal. Judgment normal. Her mood appears not anxious. Cognition and memory are normal. She does not exhibit a depressed mood.          Assessment & Plan:

## 2018-02-14 NOTE — Patient Instructions (Addendum)
Keep oxygen between 2L and 3L nasal cannula.  Continue current dose of hydralaxine.  Can swish water in mouth, especially after inhaler.  Keep skin lesion clean with warm soapy water. Can apply antibiotic ointment as needed.

## 2018-02-17 ENCOUNTER — Telehealth: Payer: Self-pay | Admitting: Family Medicine

## 2018-02-17 NOTE — Telephone Encounter (Signed)
I need written order to fax.  Please write order on prescription for AZO.

## 2018-02-17 NOTE — Telephone Encounter (Signed)
done

## 2018-02-17 NOTE — Telephone Encounter (Signed)
Copied from [place]. Topic: Inquiry >> Feb 17, 2018 12:30 PM Scherrie Gerlach wrote: Reason for CRM: Allen Derry with [facility] states pt is having burning with urination. She is requesting the 3 day "AZO urinary pain relief" order sent )faxed) to her living facility, [facility]  Fax  [phone]  [place] would like to try OTC first.l pt reports better when she drinks water

## 2018-02-17 NOTE — Telephone Encounter (Signed)
Can they take a urine culture order?  AZO order is ok

## 2018-02-17 NOTE — Telephone Encounter (Addendum)
Order faxed to [phone] as requested.

## 2018-02-18 ENCOUNTER — Emergency Department

## 2018-02-18 ENCOUNTER — Emergency Department
Admission: EM | Admit: 2018-02-18 | Discharge: 2018-02-19 | Disposition: A | Discharge status: Home / Self Care | Attending: Internal Medicine | Admitting: Internal Medicine

## 2018-02-18 ENCOUNTER — Encounter: Payer: Self-pay | Admitting: Emergency Medicine

## 2018-02-18 ENCOUNTER — Other Ambulatory Visit: Payer: Self-pay

## 2018-02-18 DIAGNOSIS — R0902 Hypoxemia: Secondary | ICD-10-CM | POA: Diagnosis not present

## 2018-02-18 DIAGNOSIS — Y929 Unspecified place or not applicable: Secondary | ICD-10-CM | POA: Insufficient documentation

## 2018-02-18 DIAGNOSIS — I130 Hypertensive heart and chronic kidney disease with heart failure and stage 1 through stage 4 chronic kidney disease, or unspecified chronic kidney disease: Secondary | ICD-10-CM | POA: Insufficient documentation

## 2018-02-18 DIAGNOSIS — M25561 Pain in right knee: Secondary | ICD-10-CM | POA: Diagnosis not present

## 2018-02-18 DIAGNOSIS — W19XXXA Unspecified fall, initial encounter: Secondary | ICD-10-CM | POA: Diagnosis not present

## 2018-02-18 DIAGNOSIS — S0101XA Laceration without foreign body of scalp, initial encounter: Secondary | ICD-10-CM | POA: Insufficient documentation

## 2018-02-18 DIAGNOSIS — N184 Chronic kidney disease, stage 4 (severe): Secondary | ICD-10-CM | POA: Diagnosis not present

## 2018-02-18 DIAGNOSIS — S0990XA Unspecified injury of head, initial encounter: Secondary | ICD-10-CM | POA: Diagnosis not present

## 2018-02-18 DIAGNOSIS — Z87891 Personal history of nicotine dependence: Secondary | ICD-10-CM | POA: Insufficient documentation

## 2018-02-18 DIAGNOSIS — J449 Chronic obstructive pulmonary disease, unspecified: Secondary | ICD-10-CM | POA: Insufficient documentation

## 2018-02-18 DIAGNOSIS — R0600 Dyspnea, unspecified: Secondary | ICD-10-CM | POA: Diagnosis not present

## 2018-02-18 DIAGNOSIS — M542 Cervicalgia: Secondary | ICD-10-CM | POA: Diagnosis not present

## 2018-02-18 DIAGNOSIS — Z7401 Bed confinement status: Secondary | ICD-10-CM | POA: Diagnosis not present

## 2018-02-18 DIAGNOSIS — I5032 Chronic diastolic (congestive) heart failure: Secondary | ICD-10-CM | POA: Diagnosis not present

## 2018-02-18 DIAGNOSIS — R51 Headache: Secondary | ICD-10-CM | POA: Diagnosis not present

## 2018-02-18 DIAGNOSIS — Y998 Other external cause status: Secondary | ICD-10-CM | POA: Diagnosis not present

## 2018-02-18 DIAGNOSIS — Z9911 Dependence on respirator [ventilator] status: Secondary | ICD-10-CM | POA: Diagnosis not present

## 2018-02-18 DIAGNOSIS — Z79899 Other long term (current) drug therapy: Secondary | ICD-10-CM | POA: Diagnosis not present

## 2018-02-18 DIAGNOSIS — Z7982 Long term (current) use of aspirin: Secondary | ICD-10-CM | POA: Insufficient documentation

## 2018-02-18 DIAGNOSIS — Y939 Activity, unspecified: Secondary | ICD-10-CM | POA: Diagnosis not present

## 2018-02-18 DIAGNOSIS — S8991XA Unspecified injury of right lower leg, initial encounter: Secondary | ICD-10-CM | POA: Diagnosis not present

## 2018-02-18 DIAGNOSIS — S199XXA Unspecified injury of neck, initial encounter: Secondary | ICD-10-CM | POA: Diagnosis not present

## 2018-02-18 DIAGNOSIS — E785 Hyperlipidemia, unspecified: Secondary | ICD-10-CM | POA: Diagnosis not present

## 2018-02-18 DIAGNOSIS — W0110XA Fall on same level from slipping, tripping and stumbling with subsequent striking against unspecified object, initial encounter: Secondary | ICD-10-CM | POA: Diagnosis not present

## 2018-02-18 LAB — CBC WITH DIFFERENTIAL/PLATELET
BASOS ABS: 0.1 10*3/uL (ref 0–0.1)
BASOS PCT: 1 %
Eosinophils Absolute: 0.2 10*3/uL (ref 0–0.7)
Eosinophils Relative: 2 %
HEMATOCRIT: 36.1 % (ref 35.0–47.0)
HEMOGLOBIN: 12.1 g/dL (ref 12.0–16.0)
LYMPHS PCT: 7 %
Lymphs Abs: 0.9 10*3/uL — ABNORMAL LOW (ref 1.0–3.6)
MCH: 30 pg (ref 26.0–34.0)
MCHC: 33.6 g/dL (ref 32.0–36.0)
MCV: 89.3 fL (ref 80.0–100.0)
MONO ABS: 1.1 10*3/uL — AB (ref 0.2–0.9)
Monocytes Relative: 9 %
NEUTROS ABS: 10.2 10*3/uL — AB (ref 1.4–6.5)
NEUTROS PCT: 81 %
Platelets: 252 10*3/uL (ref 150–440)
RBC: 4.04 MIL/uL (ref 3.80–5.20)
RDW: 13.4 % (ref 11.5–14.5)
WBC: 12.5 10*3/uL — ABNORMAL HIGH (ref 3.6–11.0)

## 2018-02-18 LAB — BASIC METABOLIC PANEL
Anion gap: 11 (ref 5–15)
BUN: 32 mg/dL — ABNORMAL HIGH (ref 6–20)
CHLORIDE: 99 mmol/L — AB (ref 101–111)
CO2: 27 mmol/L (ref 22–32)
Calcium: 8.8 mg/dL — ABNORMAL LOW (ref 8.9–10.3)
Creatinine, Ser: 1.56 mg/dL — ABNORMAL HIGH (ref 0.44–1.00)
GFR calc non Af Amer: 26 mL/min — ABNORMAL LOW (ref >60)
GFR, EST AFRICAN AMERICAN: 31 mL/min — AB (ref >60)
GLUCOSE: 125 mg/dL — AB (ref 65–99)
POTASSIUM: 4.1 mmol/L (ref 3.5–5.1)
Sodium: 137 mmol/L (ref 135–145)

## 2018-02-18 LAB — TROPONIN I: Troponin I: 0.03 ng/mL (ref <0.03)

## 2018-02-18 MED ORDER — LIDOCAINE HCL (PF) 1 % IJ SOLN
5.0000 mL | Freq: Once | INTRAMUSCULAR | Status: AC
  Administered 2018-02-18: 5 mL
  Filled 2018-02-18: qty 5

## 2018-02-18 NOTE — ED Notes (Signed)
Report given to Seth Bake, RN. Pt and family updated on plan at this time.

## 2018-02-18 NOTE — ED Notes (Signed)
Pt placed on bedpan to urinate.

## 2018-02-18 NOTE — ED Triage Notes (Signed)
Pt in via [facility] from [place] with mechanical fall with walker, denies LOC.  Pt with laceration to right posterior head, complaints of right knee and ankle pain.  Pt A/Ox4, NAD noted at this time.

## 2018-02-18 NOTE — Discharge Instructions (Addendum)
You will need the staples removed in 7-10 days, the stitches will dissolve on their own. Please seek medical attention for any high fevers, chest pain, shortness of breath, change in behavior, persistent vomiting, bloody stool or any other new or concerning symptoms.

## 2018-02-18 NOTE — ED Notes (Signed)
Patient transported to X-ray, pt granddaughter updated on plan of care at this time, please call her with disposition.

## 2018-02-18 NOTE — ED Notes (Signed)
Head cleansed, no further lacerations or bleeding noted. Staples remain intact. Avulsion to the right finger, steri strips placed. Also, small abrasion to the posterior right hand, vaseline gauze placed, hand wrapped in kerlix. Right elbow avulsed area, wet to dry dressing placed, wrapped in kerlix. EDP make aware of the same.

## 2018-02-18 NOTE — ED Provider Notes (Signed)
[facility] Emergency Department Provider Note  ____________________________________________   I have reviewed the triage vital signs and the nursing notes.   HISTORY  Chief Complaint Fall   History limited by: Not Limited   HPI Elizabeth Silva is a 83 y.o. female who presents to the emergency department today after a fall.  The patient was using her walker.  Her walker got caught in the elevator door.  She fell backwards hitting the right side of your body in her head.  She did suffer a laceration to her scalp.  The patient denies any significant headache.  Her main concern is for right knee and right ankle pain.  She does states she has a history of pain in those joints but is worried that she cause more harm.  Patient denies any recent illness.   Per medical record review patient has a history of COPD.  Past Medical History:  Diagnosis Date  . Allergic rhinitis   . CKD (chronic kidney disease)   . COPD (chronic obstructive pulmonary disease) ([place])   . Diastolic CHF, acute (HCC)    echo (4/11) with EF 60-65%,mild LVH, mild aortic insufficiency, mild MR, PA systolic pressure 88-41 mmHg, mild to moderate TR  . Diverticulosis of colon   . GERD (gastroesophageal reflux disease)   . GI bleed   . H/O Clostridium difficile infection Sept. 2013  . History of PFTs   . Hyperlipidemia   . Hypertension    LE swelling with amlodipine, intolerant of ACEIs  . Impaired vision    right eye, was told due to HTN  . LBBB (left bundle branch block)   . Osteoarthritis     Patient Active Problem List   Diagnosis Date Noted  . Hospice care patient 02/14/2018  . Lung cancer ([place]) 12/12/2017  . Senile purpura ([place]) 04/23/2017  . Trigger finger 03/04/2017  . Gout 11/23/2016  . Chronic systolic (congestive) heart failure ([place]) 11/02/2016  . Goals of care, counseling/discussion   . Upper GI bleed 04/26/2016  . Chronic constipation 04/12/2016  . Abnormal chest x-ray  01/12/2016  . Right knee pain 01/10/2016  . Advance directive discussed with patient 01/10/2016  . Abnormal swallowing 10/27/2015  . Dyspnea 10/27/2015  . Vision loss, bilateral 10/26/2014  . Carotid stenosis 10/26/2014  . Bilateral tinnitus 04/20/2014  . Prediabetes 01/07/2014  . Bilateral knee pain 01/07/2014  . Hip pain, left 01/07/2014  . SOB (shortness of breath) 05/28/2013  . Insomnia 01/02/2013  . Anemia of chronic renal failure 12/25/2010  . CONSTIPATION 12/08/2010  . DIASTOLIC HEART FAILURE, CHRONIC 03/02/2010  . INCONTINENCE, URGE 01/25/2010  . PERIPHERAL NEUROPATHY, IDIOPATHIC 05/10/2008  . PERIPHERAL VASCULAR DISEASE 05/05/2007  . Chronic venous insufficiency 05/05/2007  . ALLERGIC RHINITIS 05/05/2007  . COPD, moderate ([place]) 05/05/2007  . GERD 05/05/2007  . DIVERTICULOSIS, COLON 05/05/2007  . Osteoarthritis, multiple sites 05/05/2007  . DEGENERATION, CERVICAL DISC 05/05/2007  . STRESS INCONTINENCE 05/05/2007  . HYPERCHOLESTEROLEMIA, PURE 04/07/2007  . RENAL STONE 04/07/2007  . Essential hypertension, benign 03/17/2007  . Chronic kidney disease, stage 4, severely decreased GFR ([place]) 03/17/2007  . CARDIAC MURMUR 03/17/2007    Past Surgical History:  Procedure Laterality Date  . CARDIAC CATHETERIZATION  1999   normal  . CERVICAL [place] SURGERY  2005   cerv. disc, dz. C5-C7, facet arthritis  . CHOLECYSTECTOMY  1980  . TOTAL ABDOMINAL HYSTERECTOMY  1970   suspicious cells    Prior to Admission medications   Medication Sig Start  Date End Date Taking? Authorizing Provider  aspirin 81 MG EC tablet Take 81 mg by mouth daily.      [provider]  diclofenac sodium (VOLTAREN) 1 % GEL Apply 4 g topically 4 (four) times daily. 08/06/17   Bedsole, Amy E, MD  diphenhydramine-acetaminophen (TYLENOL PM) 25-500 MG TABS tablet Take 1 tablet by mouth at bedtime as needed.    [provider]  ferrous fumarate (HEMOCYTE - 106 MG FE) 325 (106 FE) MG TABS tablet  Take 1 tablet (106 mg of iron total) by mouth daily. 10/27/13   Bedsole, Amy E, MD  fluticasone (FLONASE) 50 MCG/ACT nasal spray USE 2 SPRAYS IN EACH  NOSTRIL DAILY 01/01/18   Bedsole, Amy E, MD  Fluticasone-Salmeterol (ADVAIR DISKUS) 250-50 MCG/DOSE AEPB INHALE 1 PUFF INTO THE  LUNGS TWO TIMES DAILY 11/08/17   Bedsole, Amy E, MD  gabapentin (NEURONTIN) 600 MG tablet TAKE 1 TABLET BY MOUTH AT  BEDTIME 02/04/18   Bedsole, Amy E, MD  hydrALAZINE (APRESOLINE) 50 MG tablet Take 1 tablet (50 mg total) by mouth 3 (three) times daily. 01/20/18 04/20/18  Theora Gianotti, NP  ipratropium (ATROVENT) 0.03 % nasal spray Place 2 sprays into both nostrils every 12 (twelve) hours. 03/06/17   Copland, Frederico Hamman, MD  Lactobacillus Rhamnosus, GG, (CULTURELLE PO) Take 1 tablet by mouth 2 (two) times daily.    [provider]  lovastatin (MEVACOR) 20 MG tablet TAKE 1 TABLET BY MOUTH AT  BEDTIME 05/20/17   Minna Merritts, MD  metoprolol succinate (TOPROL-XL) 25 MG 24 hr tablet TAKE 1 TABLET BY MOUTH  DAILY WITH OR IMMEDIATELY  FOLLOWING A MEAL 02/06/18   Gollan, Kathlene November, MD  omeprazole (PRILOSEC) 40 MG capsule TAKE 1 CAPSULE BY MOUTH  DAILY 02/06/18   Bedsole, Amy E, MD  PROAIR HFA 108 (90 Base) MCG/ACT inhaler INHALE 2 PUFFS EVERY 6  HOURS AS NEEDED FOR  WHEEZING OR SHORTNESS OF  BREATH. 10/17/16   Bedsole, Amy E, MD  SPIRIVA HANDIHALER 18 MCG inhalation capsule INHALE THE CONTENTS OF 1  CAPSULE VIA HANDIHALER  DAILY 08/09/16   Bedsole, Amy E, MD  traMADol (ULTRAM) 50 MG tablet Take 1 tablet (50 mg total) by mouth every 12 (twelve) hours as needed for severe pain. 11/11/17   Jinny Sanders, MD    Allergies Levofloxacin and Penicillins  Family History  Problem Relation Age of Onset  . Heart block Mother        complete  . Dementia Father   . Colon cancer Sister   . Dementia Sister   . Lung cancer Brother   . Cancer Brother        sinus  . Diabetes Brother     Social History Social History    Tobacco Use  . Smoking status: Former Smoker    Packs/day: 1.00    Types: Cigarettes    Last attempt to quit: 10/15/1984    Years since quitting: 33.3  . Smokeless tobacco: Never Used  Substance Use Topics  . Alcohol use: Yes    Alcohol/week: 0.0 oz  . Drug use: No    Review of Systems Constitutional: No fever/chills Eyes: No visual changes. ENT: No sore throat. Cardiovascular: Denies chest pain. Respiratory: Denies shortness of breath. Gastrointestinal: No abdominal pain.  No nausea, no vomiting.  No diarrhea.   Genitourinary: Negative for dysuria. Musculoskeletal: Right knee and right ankle pain. Skin: Laceration to back of head Neurological: Negative for headaches, focal weakness or numbness.  ____________________________________________   PHYSICAL EXAM:  VITAL SIGNS: ED Triage Vitals  Enc Vitals Group     BP 02/18/18 1643 (!) 198/69     Pulse Rate 02/18/18 1643 93     Resp 02/18/18 1643 20     Temp 02/18/18 1643 98.7 F (37.1 C)     Temp Source 02/18/18 1643 Oral     SpO2 02/18/18 1643 90 %     Weight 02/18/18 1649 120 lb (54.4 kg)     Height 02/18/18 1649 5\' 1"  (1.549 m)     Head Circumference --      Peak Flow --      Pain Score 02/18/18 1647 5   Constitutional: Alert and oriented.  Eyes: Conjunctivae are normal.  ENT   Head: Normocephalic   Nose: No congestion/rhinnorhea.   Mouth/Throat: Mucous membranes are moist.   Neck: No stridor. Hematological/Lymphatic/Immunilogical: No cervical lymphadenopathy. Cardiovascular: Normal rate, regular rhythm.  No murmurs, rubs, or gallops.  Respiratory: Normal respiratory effort without tachypnea nor retractions. Breath sounds are clear and equal bilaterally. No wheezes/rales/rhonchi. Gastrointestinal: Soft and non tender. No rebound. No guarding.  Genitourinary: Deferred Musculoskeletal: Normal range of motion in all extremities. No lower extremity edema. Neurologic:  Normal speech and language. No  gross focal neurologic deficits are appreciated.  Skin:  Small laceration to back of head. Psychiatric: Mood and affect are normal. Speech and behavior are normal. Patient exhibits appropriate insight and judgment.  ____________________________________________    LABS (pertinent positives/negatives)  Trop 0.03 CBC wbc 12.5, hgb 12.1, plt 252 BMP na 137, glu 125, cr 1.56  ____________________________________________   EKG  None  ____________________________________________    RADIOLOGY  Right knee No acute findings  Right ankle Old distal fibula fracture  CT head/cervical spine No fracture, intracranial process  CXR No acute process  ____________________________________________   PROCEDURES  Procedures  LACERATION REPAIR Performed by: Nance Pear Authorized by: Nance Pear Consent: Verbal consent obtained. Risks and benefits: risks, benefits and alternatives were discussed Consent given by: patient Patient identity confirmed: provided demographic data Prepped and Draped in normal sterile fashion Wound explored  Laceration Location: occipital scalp  Laceration Length: 1.5 cm  No Foreign Bodies seen or palpated  Irrigation method: syringe Amount of cleaning: standard  Skin closure: staples and 5-0 vicryl rapide  Number of sutures: 3 Number of staples: 2  Technique: simple interrupted  Patient tolerance: Patient tolerated the procedure well with no immediate complications.  ____________________________________________   INITIAL IMPRESSION / ASSESSMENT AND PLAN / ED COURSE  Pertinent labs & imaging results that were available during my care of the patient were reviewed by me and considered in my medical decision making (see chart for details).  Patient presented to the emergency department today after a fall.  It was a mechanical fall when the patient got her walker stuck.  She fell back and hit her head.  On exam she did have a  roughly 1.5 cm laceration to her occiput.  Initially attempted to control with staples however patient continued to have some bleeding.  This was controlled with sutures.  Does appear to be mechanical fall.  However the patient was noted to be hypoxic.  Chest x-ray and blood work without concerning findings.  Discussed with hospitalist however they did note per chart review patient does have oxygen at home.  She does have known hypoxia issues.  Discussed with the patient that at this time given known hypoxia and that she already has oxygen feel patient can  be discharged back to home.    ____________________________________________   FINAL CLINICAL IMPRESSION(S) / ED DIAGNOSES  Final diagnoses:  Hypoxia  Fall, initial encounter  Laceration of scalp, initial encounter     Note: This dictation was prepared with Dragon dictation. Any transcriptional errors that result from this process are unintentional     Nance Pear, MD 02/19/18 [phone]

## 2018-02-19 ENCOUNTER — Telehealth: Payer: Self-pay | Admitting: Family Medicine

## 2018-02-19 NOTE — Telephone Encounter (Signed)
Please disregard ,opened in error 

## 2018-02-19 NOTE — ED Notes (Addendum)
Patient placed on bedpan and changed into a clean brief by this EDT.

## 2018-02-19 NOTE — ED Notes (Signed)
Discharge papers reviewed with pt and her family. Pt awaiting EMS transport back to [facility].

## 2018-02-19 NOTE — Telephone Encounter (Signed)
Copied from [place] [phone]. Topic: General - Other >> Feb 19, 2018 11:05 AM Carolyn Stare wrote:  Dr Diona Browner did not have anything on 02/28/18 and her pt need a staple removal from fall and ER visit so I sit this pt up with Dr Damita Dunnings if this is not ok let me know

## 2018-02-27 LAB — COLOGUARD

## 2018-02-28 ENCOUNTER — Inpatient Hospital Stay: Payer: Medicare Other | Admitting: Family Medicine

## 2018-03-04 ENCOUNTER — Encounter: Payer: Self-pay | Admitting: Family Medicine

## 2018-03-05 NOTE — Telephone Encounter (Signed)
Last office visit 02/14/2018.  Last refilled 11/11/2017 for #60 with 2 refill.  Ok to refill?  Daughter is requesting 90 day supply but [facility] will only do a 30 day supply of Tramadol at one time.

## 2018-03-06 MED ORDER — TRAMADOL HCL 50 MG PO TABS: 50.0000 mg | ORAL_TABLET | Freq: Two times a day (BID) | ORAL | 2 refills | Status: AC | PRN

## 2018-03-07 ENCOUNTER — Telehealth: Payer: Self-pay | Admitting: *Deleted

## 2018-03-07 NOTE — Telephone Encounter (Signed)
Received fax from [facility] requesting PA for Tramadol.  PA completed on CoverMyMeds.  Sent for review.  Can take up to 72 hours for a decision.

## 2018-03-07 NOTE — Telephone Encounter (Signed)
PA for Tramadol was denied as a benefit exclusion.  The drug will not be approved for separate payment under Medicare Part D when used for treatment, palliation, and or management of treatment of the terminal and or related conditions when enrolled in Hospice.  Not sure Tramadol is for condition related to Hospice admission.  Will forward to see if Dr. Diona Browner wants to write a letter of appeal to try and get Tramadol approved.  Denial forms placed in Dr. Rometta Emery in box for review.

## 2018-03-10 ENCOUNTER — Other Ambulatory Visit
Admission: RE | Admit: 2018-03-10 | Discharge: 2018-03-10 | Disposition: A | Discharge status: Home / Self Care | Payer: Medicare Other | Source: Other Acute Inpatient Hospital | Attending: Family Medicine | Admitting: Family Medicine

## 2018-03-10 DIAGNOSIS — N184 Chronic kidney disease, stage 4 (severe): Secondary | ICD-10-CM | POA: Insufficient documentation

## 2018-03-10 LAB — URINALYSIS, COMPLETE (UACMP) WITH MICROSCOPIC
BILIRUBIN URINE: NEGATIVE
Glucose, UA: NEGATIVE mg/dL
Hgb urine dipstick: NEGATIVE
KETONES UR: NEGATIVE mg/dL
Nitrite: NEGATIVE
Protein, ur: 30 mg/dL — AB
Specific Gravity, Urine: 1.009 (ref 1.005–1.030)
pH: 5 (ref 5.0–8.0)

## 2018-03-11 ENCOUNTER — Encounter: Payer: Self-pay | Admitting: Family Medicine

## 2018-03-11 NOTE — Telephone Encounter (Signed)
Please write appeal letter stating what patient take Tramadol for and that it is not a medication used associated with her hospice diagnosis.

## 2018-03-11 NOTE — Telephone Encounter (Signed)
Letter in Elizabeth Silva's box

## 2018-03-12 NOTE — Telephone Encounter (Signed)
Merry Proud requesting call back with the DX for the Tramadol and needing to know if pt is enrolled in a hospice program. Please call at [phone].

## 2018-03-12 NOTE — Telephone Encounter (Signed)
Appeal Letter faxed to [facility] [phone].

## 2018-03-12 NOTE — Telephone Encounter (Signed)
Left message for Merry Proud that the Dx for Tramadol is bilateral knee pain and yes patient is enrolled in Hospice care.  I advised that the Tramadol medication is not used in relation to her hospice diagnosis (Lung Cancer).

## 2018-03-13 ENCOUNTER — Telehealth: Payer: Self-pay

## 2018-03-13 ENCOUNTER — Other Ambulatory Visit: Payer: Self-pay | Admitting: Family Medicine

## 2018-03-13 LAB — URINE CULTURE: Culture: >=100000 — AB

## 2018-03-13 MED ORDER — SULFAMETHOXAZOLE-TRIMETHOPRIM 800-160 MG PO TABS: 1.0000 | ORAL_TABLET | Freq: Two times a day (BID) | ORAL | 0 refills | Status: DC

## 2018-03-13 NOTE — Progress Notes (Signed)
sent 

## 2018-03-14 ENCOUNTER — Encounter: Payer: Self-pay | Admitting: Family Medicine

## 2018-03-14 MED ORDER — CEPHALEXIN 500 MG PO CAPS: 500.0000 mg | ORAL_CAPSULE | Freq: Two times a day (BID) | ORAL | 0 refills | Status: AC

## 2018-03-14 NOTE — Telephone Encounter (Signed)
Sent Mychart note... Please also call to let caregiver know.. I sent in different antibiotics cephalexin x 7 days BID.

## 2018-03-14 NOTE — Telephone Encounter (Signed)
This Probation officer investigated breakdown in communication from this week around patient's urinalysis/cx lab.  I spoke with hospice nurse, Karena Addison, who collected sample on Monday.  Karena Addison reports that she did NOT speak with anyone directly from our office, or on call service on 5/27, but just faxed the order request to our office that afternoon for u/a with c/s for signature.  Our office was closed due to the [facility] Day holiday.  She proceeded to collect specimen and sent in to [facility] under Dr. Rometta Emery name.    Our office was closed and fax machine easily becomes overwhelmed/jammed during long holiday weekends.  Order request never seen by office and PCP was unaware of request or results.  Hospice nurse stated that she faxed results on Tuesday, 03/11/18 to the office but after further investigation realized that the results were faxed to the wrong number.  Urine results/Culture report not received by office until 03/13/18.  Patient immediately started on antibiotics by Dr. Lorelei Pont as Dr. Diona Browner was out of the office.  In follow up with [facility] at [facility], I informed her to please always contact our office or after hours service to speak to someone for order requests of this nature, as our fax is not the timeliest or most reliable source.  Also, we need to provide a documentation and a paper trail of the triaged situation.  Karena Addison acknowledged understanding and agrees to use this process moving forward.

## 2018-03-14 NOTE — Telephone Encounter (Signed)
Per Rosemarie Ax medication is being approved. Tramadol 50mg  Auth # [id number] Valid eff 03/07/18-10/14/18

## 2018-03-14 NOTE — Telephone Encounter (Signed)
Collie Siad (daughter) notified as instructed by telephone.

## 2018-03-18 ENCOUNTER — Telehealth: Payer: Self-pay

## 2018-03-18 NOTE — Telephone Encounter (Signed)
FL-2 completed and faxed to [place] at [facility] in [place] at [phone].

## 2018-03-18 NOTE — Telephone Encounter (Signed)
Copied from [place] [phone]. Topic: Inquiry >> Mar 18, 2018  1:23 PM Pricilla Handler wrote: Reason for CRM: Horris Latino of [facility] [phone]) called stating that she is faxing over paperwork that needs to be completed today. Patient needs to be moved to the facility by tomorrow due to her condition. Please call Horris Latino today at [phone].    Thank You!!!

## 2018-03-19 ENCOUNTER — Telehealth: Payer: Self-pay | Admitting: Family Medicine

## 2018-03-19 NOTE — Telephone Encounter (Signed)
Copied from [place] [phone]. Topic: Quick Communication - See Telephone Encounter >> Mar 19, 2018  8:29 AM Synthia Innocent wrote: CRM for notification. See Telephone encounter for: 03/19/18. Hospice is trying to fax over results, unable to get fax to go through. UA results final report. Requesting call back

## 2018-03-19 NOTE — Telephone Encounter (Signed)
Spoke to [employer] and provided Dr Rometta Emery direct fax at [phone].

## 2018-03-24 ENCOUNTER — Encounter: Payer: Self-pay | Admitting: Family Medicine

## 2018-03-27 DIAGNOSIS — L989 Disorder of the skin and subcutaneous tissue, unspecified: Secondary | ICD-10-CM | POA: Insufficient documentation

## 2018-03-27 NOTE — Assessment & Plan Note (Signed)
Hopsice following and helping with pt symptoms and complaints.

## 2018-03-27 NOTE — Assessment & Plan Note (Signed)
Symptomatic with SOB and now dependant on oxygen.

## 2018-03-27 NOTE — Assessment & Plan Note (Addendum)
Well controlled at home. Continue current dose of hydralaxine.

## 2018-03-27 NOTE — Assessment & Plan Note (Signed)
Dry skin, irritated for scratching. Stop scratching and apply antibiotic ointment to prevent bacterial infection while healing.

## 2018-03-30 ENCOUNTER — Encounter: Payer: Self-pay | Admitting: Family Medicine

## 2018-03-31 MED ORDER — SERTRALINE HCL 25 MG PO TABS: 25.0000 mg | ORAL_TABLET | Freq: Every day | ORAL | 2 refills | Status: AC

## 2018-04-01 ENCOUNTER — Telehealth: Payer: Self-pay | Admitting: Family Medicine

## 2018-04-01 MED ORDER — PREDNISONE 10 MG PO TABS: ORAL_TABLET | ORAL | 0 refills | Status: AC

## 2018-04-01 NOTE — Telephone Encounter (Signed)
rx sent.  Please get update on patient tomorrow.  Would continue baseline inhalers, assuming patient is able to do so.  If fever or other symptoms please let me know.  Thanks.  Routed to PCP as FYI.

## 2018-04-01 NOTE — Telephone Encounter (Signed)
Daughter advised.  Daughter says she won't start the medication until tomorrow and it will take a few days for it to help.

## 2018-04-01 NOTE — Telephone Encounter (Signed)
Copied from [place] [phone]. Topic: Inquiry >> Apr 01, 2018  2:16 PM Lennox Solders wrote: Reason for CRM: rusty cook rn [facility] [place] is calling pt is having  facial drooping on left side. Pt is have increase aspiration with food. Pt lung has increase congestion and wet lungs. Stormy Card would like to know if md would prescribe prednisone [facility] [phone]

## 2018-04-01 NOTE — Telephone Encounter (Signed)
Dr Diona Browner out of office.Please advise.

## 2018-04-03 NOTE — Telephone Encounter (Signed)
Update

## 2018-04-03 NOTE — Telephone Encounter (Signed)
Spoke with Elizabeth Silva.  She states the Hospice Nurse thinks her mom has Bells Palsy.  Her left side of her face is drooping and she can't see out of her left eye.   She didn't get to start the Prednisone until yesterday.  Elizabeth Silva is going over to see her today.  She is asking if it would be possible to keep Elizabeth Silva on a low dose prednisone continuously, at this poin,t if we see that it is helping with the Bells Palsy and her breathing issues.  She states her mom has been aspirating a lot more and coughing.  They have her now on a mechanical soft food diet.  She will contact us back to let us know how her mom is doing once she sees her today.

## 2018-04-07 ENCOUNTER — Encounter: Payer: Self-pay | Admitting: Family Medicine

## 2018-04-09 NOTE — Telephone Encounter (Signed)
Copied from [place] [phone]. Topic: General - Other >> 05/06/2018 12:57 PM Keene Breath wrote: Reason for CRM: Horris Latino with [facility] called to inform doctor Diona Browner that patient Elizabeth Silva) has passed away at 12:21pm on May 06, 2018.  CB [phone].

## 2018-04-09 NOTE — Telephone Encounter (Deleted)
Copied from [place] [id number]. Topic: General - Other >> 04-May-2018 12:57 PM Keene Breath wrote: Reason for CRM: Horris Latino with [facility] called to inform doctor Diona Browner that patient Elizabeth Silva) has passed away at 12:21pm on 2018-05-04.  CB [phone].

## 2018-04-14 DEATH — deceased

## 2018-04-22 ENCOUNTER — Ambulatory Visit: Payer: Medicare Other | Admitting: Cardiovascular Disease

## 2018-04-29 ENCOUNTER — Other Ambulatory Visit: Payer: Self-pay | Admitting: Cardiovascular Disease

## 2018-05-16 ENCOUNTER — Ambulatory Visit: Payer: Medicare Other | Admitting: Family Medicine

## 2020-01-15 IMAGING — DX DG CHEST 2V
2 series · 2 of 2 positions shown · non-contrast
Comparison: CT chest 11/07/2015. Single-view of the chest
10/29/2016.

CLINICAL DATA: Dyspnea and cough in a patient with a history of
COPD. History of pulmonary nodules.

EXAM:
CHEST  2 VIEW

[chest pa]
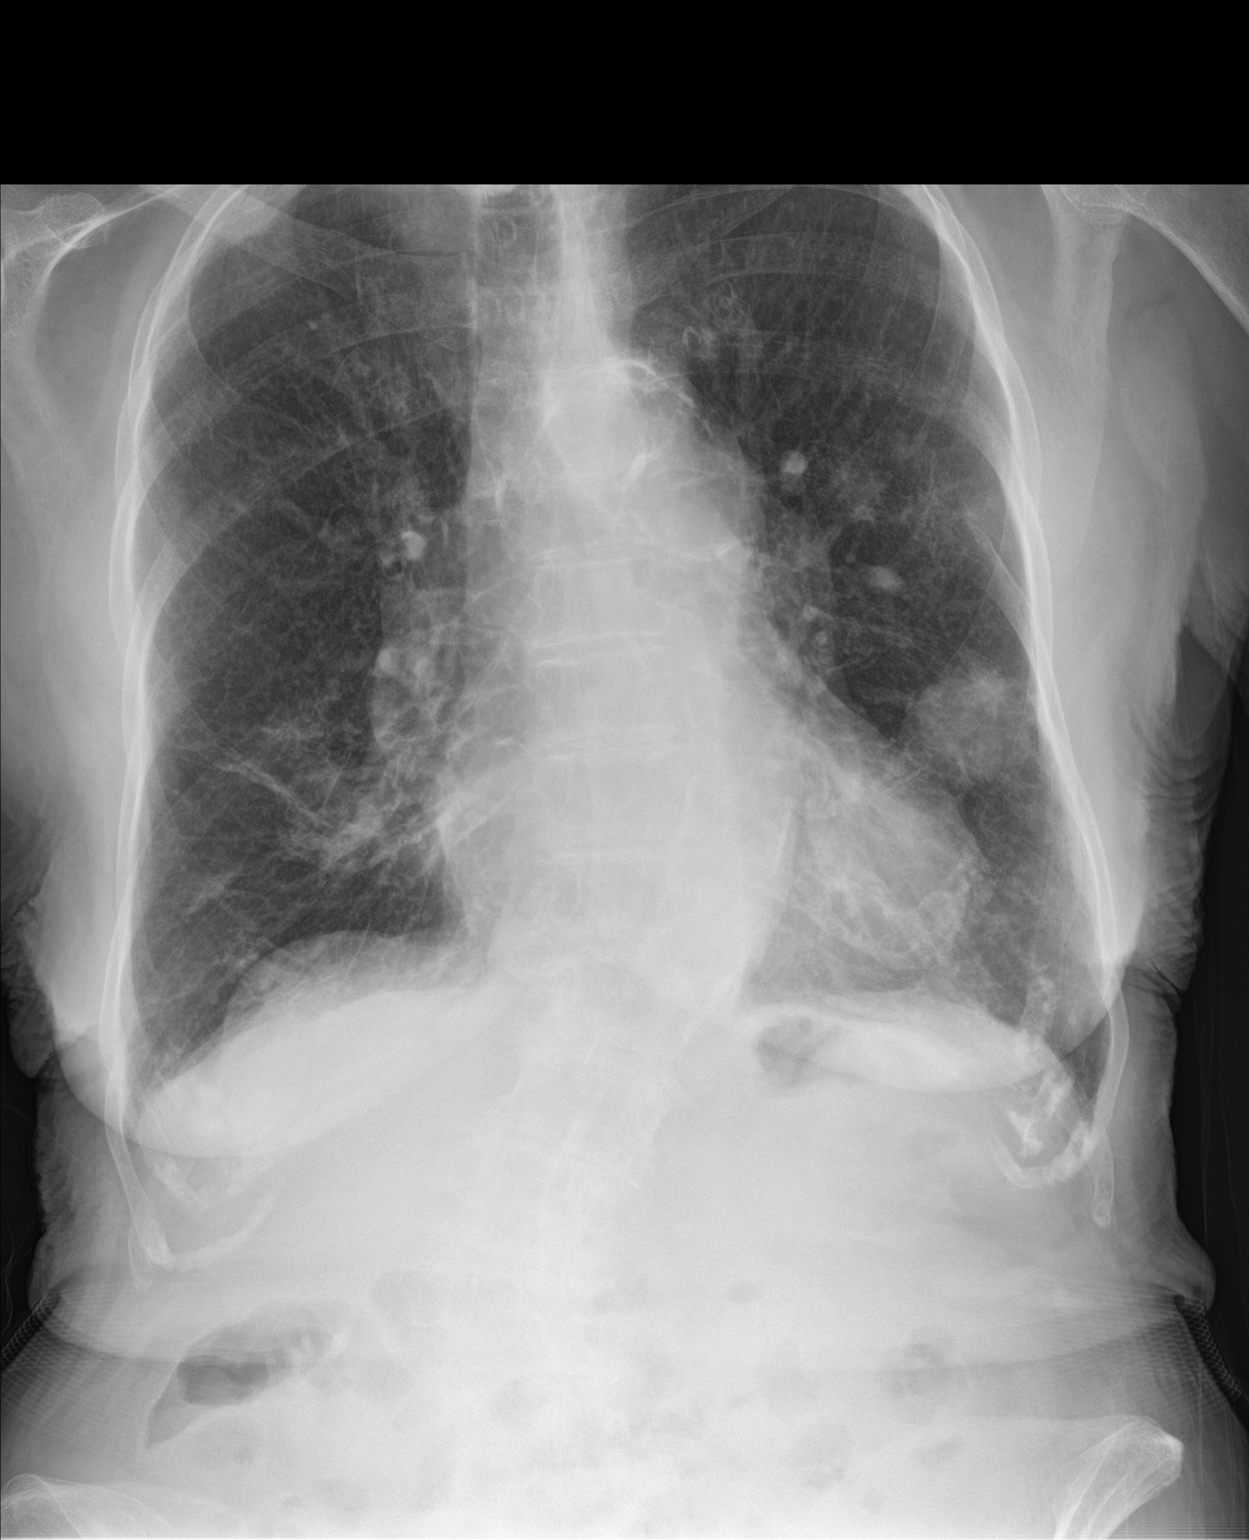

[chest lat]
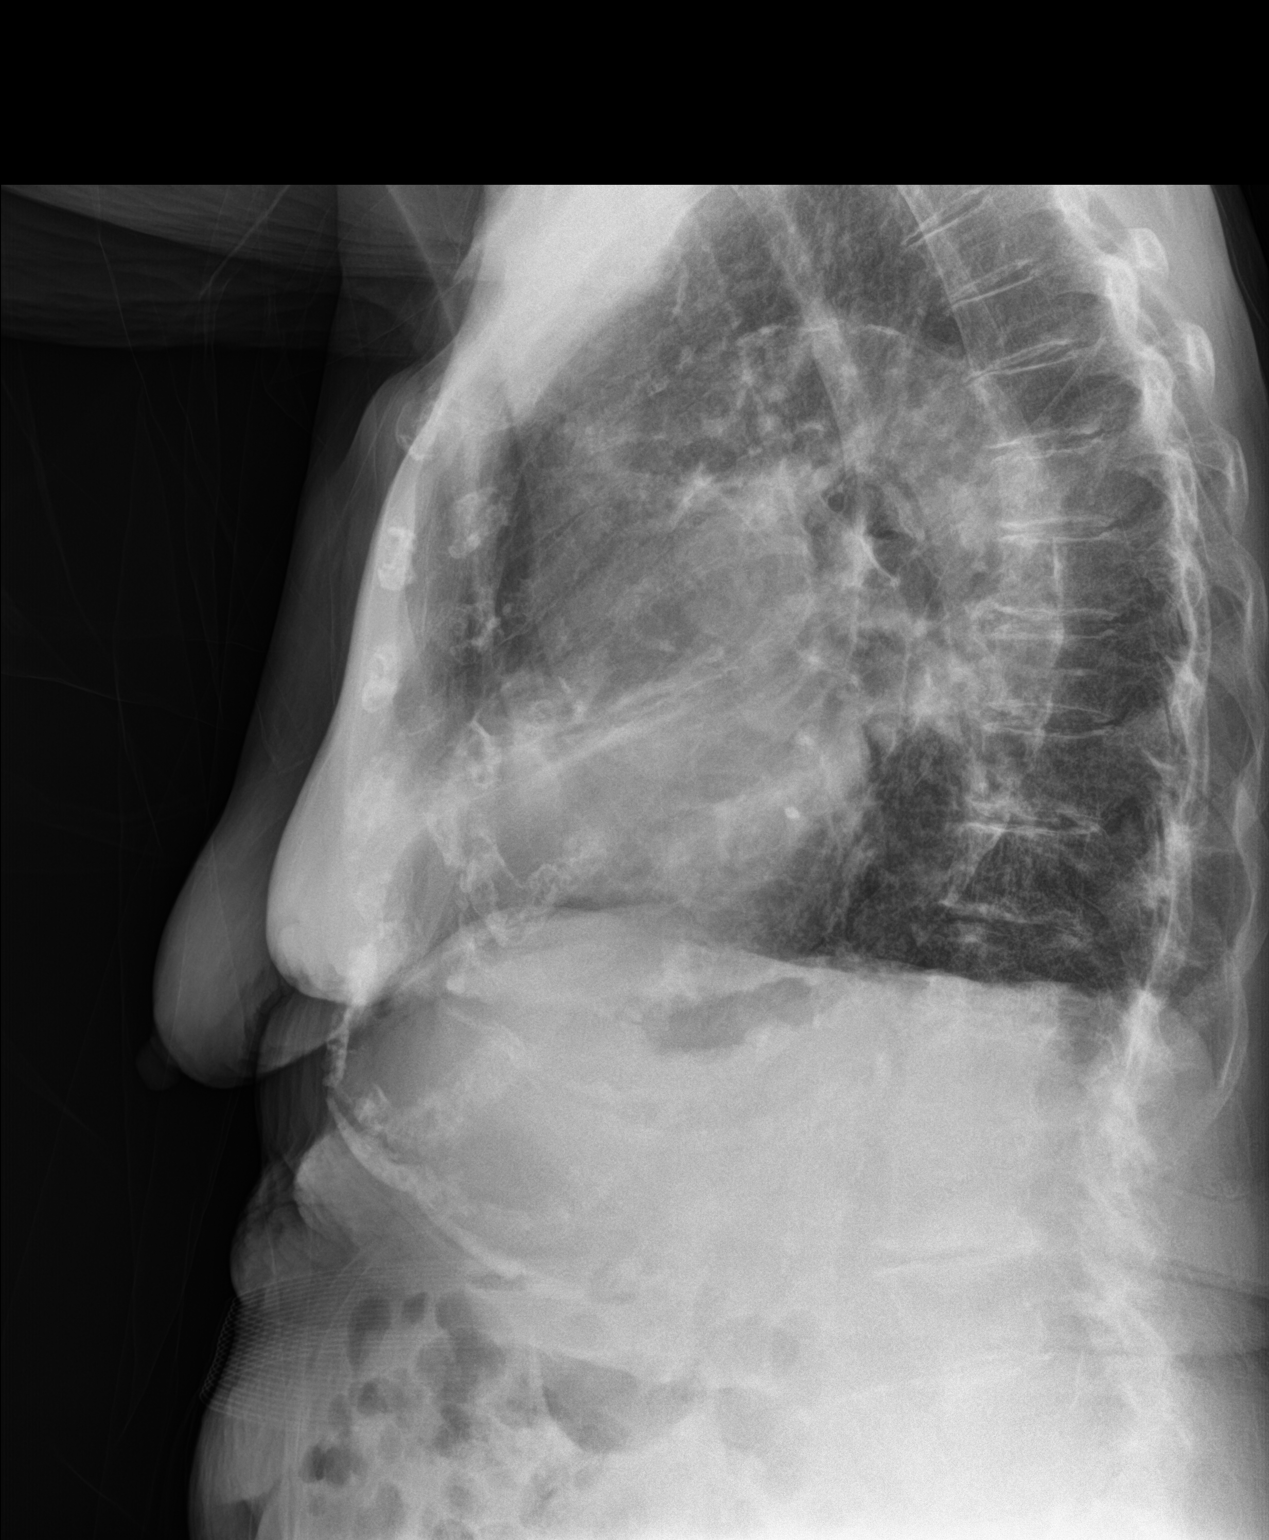

[2 of 2 positions shown; findings below may reference images not displayed]

FINDINGS: A pulmonary mass in the lingula measures 3.1 cm craniocaudal by
cm transverse and is much larger and conspicuous than on the most
recent plain film. The lungs are emphysematous. Heart size is upper
normal. Aortic atherosclerosis is seen. No acute bony abnormality.
Marked thoracolumbar scoliosis is seen.
IMPRESSION: Marked enlargement of a nodule in the lingula since the most recent
plain film most consistent with carcinoma.

Emphysema.

Atherosclerosis.

Thoracolumbar scoliosis.
